# Patient Record
Sex: Male | Born: 1935
Health system: Southern US, Community
[De-identification: ages and names within clinical notes are randomized; demographics above are authoritative.]

## PROBLEM LIST (undated history)

## (undated) DIAGNOSIS — IMO0001 Reserved for inherently not codable concepts without codable children: Secondary | ICD-10-CM

## (undated) DIAGNOSIS — I442 Atrioventricular block, complete: Secondary | ICD-10-CM

## (undated) DIAGNOSIS — N529 Male erectile dysfunction, unspecified: Secondary | ICD-10-CM

## (undated) DIAGNOSIS — I639 Cerebral infarction, unspecified: Secondary | ICD-10-CM

## (undated) DIAGNOSIS — K219 Gastro-esophageal reflux disease without esophagitis: Secondary | ICD-10-CM

## (undated) DIAGNOSIS — I495 Sick sinus syndrome: Secondary | ICD-10-CM

## (undated) DIAGNOSIS — E785 Hyperlipidemia, unspecified: Secondary | ICD-10-CM

## (undated) DIAGNOSIS — N281 Cyst of kidney, acquired: Secondary | ICD-10-CM

## (undated) DIAGNOSIS — G2581 Restless legs syndrome: Secondary | ICD-10-CM

## (undated) DIAGNOSIS — B029 Zoster without complications: Secondary | ICD-10-CM

## (undated) DIAGNOSIS — M7040 Prepatellar bursitis, unspecified knee: Secondary | ICD-10-CM

## (undated) DIAGNOSIS — I10 Essential (primary) hypertension: Secondary | ICD-10-CM

## (undated) DIAGNOSIS — F0390 Unspecified dementia without behavioral disturbance: Secondary | ICD-10-CM

## (undated) DIAGNOSIS — M1990 Unspecified osteoarthritis, unspecified site: Secondary | ICD-10-CM

## (undated) DIAGNOSIS — R5382 Chronic fatigue, unspecified: Secondary | ICD-10-CM

## (undated) DIAGNOSIS — F329 Major depressive disorder, single episode, unspecified: Secondary | ICD-10-CM

## (undated) DIAGNOSIS — N183 Chronic kidney disease, stage 3 unspecified: Secondary | ICD-10-CM

## (undated) DIAGNOSIS — I519 Heart disease, unspecified: Secondary | ICD-10-CM

## (undated) DIAGNOSIS — R911 Solitary pulmonary nodule: Secondary | ICD-10-CM

## (undated) DIAGNOSIS — I4891 Unspecified atrial fibrillation: Secondary | ICD-10-CM

## (undated) DIAGNOSIS — C689 Malignant neoplasm of urinary organ, unspecified: Secondary | ICD-10-CM

## (undated) DIAGNOSIS — Z950 Presence of cardiac pacemaker: Secondary | ICD-10-CM

## (undated) DIAGNOSIS — G4733 Obstructive sleep apnea (adult) (pediatric): Secondary | ICD-10-CM

## (undated) DIAGNOSIS — N289 Disorder of kidney and ureter, unspecified: Secondary | ICD-10-CM

## (undated) DIAGNOSIS — J189 Pneumonia, unspecified organism: Secondary | ICD-10-CM

## (undated) DIAGNOSIS — I4821 Permanent atrial fibrillation: Secondary | ICD-10-CM

## (undated) DIAGNOSIS — F32A Depression, unspecified: Secondary | ICD-10-CM

## (undated) DIAGNOSIS — R7989 Other specified abnormal findings of blood chemistry: Secondary | ICD-10-CM

## (undated) DIAGNOSIS — J40 Bronchitis, not specified as acute or chronic: Secondary | ICD-10-CM

## (undated) DIAGNOSIS — I50810 Right heart failure, unspecified: Secondary | ICD-10-CM

## (undated) DIAGNOSIS — Z8679 Personal history of other diseases of the circulatory system: Secondary | ICD-10-CM

## (undated) DIAGNOSIS — N400 Enlarged prostate without lower urinary tract symptoms: Secondary | ICD-10-CM

## (undated) DIAGNOSIS — E229 Hyperfunction of pituitary gland, unspecified: Secondary | ICD-10-CM

## (undated) HISTORY — PX: HERNIA REPAIR: SHX51

## (undated) HISTORY — DX: Bronchitis, not specified as acute or chronic: J40

## (undated) HISTORY — DX: Unspecified atrial fibrillation: I48.91

## (undated) HISTORY — DX: Other specified abnormal findings of blood chemistry: R79.89

## (undated) HISTORY — PX: OTHER SURGICAL HISTORY: SHX169

## (undated) HISTORY — DX: Heart disease, unspecified: I51.9

## (undated) HISTORY — DX: Major depressive disorder, single episode, unspecified: F32.9

## (undated) HISTORY — DX: Sick sinus syndrome: I49.5

## (undated) HISTORY — DX: Disorder of kidney and ureter, unspecified: N28.9

## (undated) HISTORY — DX: Prepatellar bursitis, unspecified knee: M70.40

## (undated) HISTORY — DX: Chronic kidney disease, stage 3 unspecified: N18.30

## (undated) HISTORY — DX: Chronic kidney disease, stage 3 (moderate): N18.3

## (undated) HISTORY — DX: Cyst of kidney, acquired: N28.1

## (undated) HISTORY — DX: Obstructive sleep apnea (adult) (pediatric): G47.33

## (undated) HISTORY — DX: Hyperfunction of pituitary gland, unspecified: E22.9

## (undated) HISTORY — PX: EYE SURGERY: SHX253

## (undated) HISTORY — DX: Male erectile dysfunction, unspecified: N52.9

## (undated) HISTORY — DX: Personal history of other diseases of the circulatory system: Z86.79

## (undated) HISTORY — DX: Chronic fatigue, unspecified: R53.82

## (undated) HISTORY — DX: Restless legs syndrome: G25.81

## (undated) HISTORY — DX: Unspecified osteoarthritis, unspecified site: M19.90

## (undated) HISTORY — DX: Reserved for inherently not codable concepts without codable children: IMO0001

## (undated) HISTORY — DX: Hyperlipidemia, unspecified: E78.5

## (undated) HISTORY — DX: Solitary pulmonary nodule: R91.1

## (undated) HISTORY — DX: Gastro-esophageal reflux disease without esophagitis: K21.9

## (undated) HISTORY — DX: Malignant neoplasm of urinary organ, unspecified: C68.9

## (undated) HISTORY — DX: Benign prostatic hyperplasia without lower urinary tract symptoms: N40.0

## (undated) HISTORY — DX: Right heart failure, unspecified: I50.810

## (undated) HISTORY — DX: Permanent atrial fibrillation: I48.21

## (undated) HISTORY — DX: Zoster without complications: B02.9

## (undated) HISTORY — DX: Atrioventricular block, complete: I44.2

## (undated) HISTORY — DX: Depression, unspecified: F32.A

---

## 1986-07-04 HISTORY — PX: PACEMAKER INSERTION: SHX728

## 2000-08-03 ENCOUNTER — Inpatient Hospital Stay (HOSPITAL_COMMUNITY): Admission: EM | Admit: 2000-08-03 | Discharge: 2000-08-07 | Payer: Self-pay | Admitting: Emergency Medicine

## 2000-08-04 ENCOUNTER — Encounter (HOSPITAL_BASED_OUTPATIENT_CLINIC_OR_DEPARTMENT_OTHER): Payer: Self-pay | Admitting: Internal Medicine

## 2000-08-05 ENCOUNTER — Encounter (HOSPITAL_BASED_OUTPATIENT_CLINIC_OR_DEPARTMENT_OTHER): Payer: Self-pay | Admitting: Internal Medicine

## 2000-12-06 ENCOUNTER — Inpatient Hospital Stay (HOSPITAL_COMMUNITY): Admission: EM | Admit: 2000-12-06 | Discharge: 2000-12-08 | Payer: Self-pay | Admitting: Emergency Medicine

## 2000-12-06 ENCOUNTER — Encounter: Payer: Self-pay | Admitting: Emergency Medicine

## 2000-12-07 ENCOUNTER — Encounter: Payer: Self-pay | Admitting: Internal Medicine

## 2000-12-08 ENCOUNTER — Encounter: Payer: Self-pay | Admitting: Internal Medicine

## 2001-03-26 HISTORY — PX: DOPPLER ECHOCARDIOGRAPHY: SHX263

## 2002-01-23 ENCOUNTER — Encounter: Payer: Self-pay | Admitting: Internal Medicine

## 2002-01-23 ENCOUNTER — Encounter: Admission: RE | Admit: 2002-01-23 | Discharge: 2002-01-23 | Payer: Self-pay | Admitting: Internal Medicine

## 2003-03-28 ENCOUNTER — Ambulatory Visit (HOSPITAL_COMMUNITY): Admission: RE | Admit: 2003-03-28 | Discharge: 2003-03-28 | Payer: Self-pay | Admitting: *Deleted

## 2003-05-04 ENCOUNTER — Encounter: Payer: Self-pay | Admitting: Internal Medicine

## 2003-05-04 ENCOUNTER — Ambulatory Visit (HOSPITAL_BASED_OUTPATIENT_CLINIC_OR_DEPARTMENT_OTHER): Admission: RE | Admit: 2003-05-04 | Discharge: 2003-05-04 | Payer: Self-pay | Admitting: Internal Medicine

## 2004-08-09 ENCOUNTER — Ambulatory Visit: Payer: Self-pay | Admitting: Internal Medicine

## 2005-02-07 ENCOUNTER — Ambulatory Visit: Payer: Self-pay | Admitting: Internal Medicine

## 2005-05-18 ENCOUNTER — Ambulatory Visit (HOSPITAL_COMMUNITY): Admission: RE | Admit: 2005-05-18 | Discharge: 2005-05-18 | Payer: Self-pay | Admitting: Cardiology

## 2006-02-06 ENCOUNTER — Ambulatory Visit: Payer: Self-pay | Admitting: Internal Medicine

## 2006-03-09 ENCOUNTER — Ambulatory Visit (HOSPITAL_BASED_OUTPATIENT_CLINIC_OR_DEPARTMENT_OTHER): Admission: RE | Admit: 2006-03-09 | Discharge: 2006-03-09 | Payer: Self-pay | Admitting: Internal Medicine

## 2006-03-09 ENCOUNTER — Encounter: Payer: Self-pay | Admitting: Internal Medicine

## 2006-03-11 ENCOUNTER — Ambulatory Visit: Payer: Self-pay | Admitting: Internal Medicine

## 2006-11-14 ENCOUNTER — Encounter: Admission: RE | Admit: 2006-11-14 | Discharge: 2006-11-14 | Payer: Self-pay | Admitting: Cardiology

## 2006-12-26 ENCOUNTER — Encounter: Admission: RE | Admit: 2006-12-26 | Discharge: 2006-12-26 | Payer: Self-pay | Admitting: Orthopedic Surgery

## 2007-06-13 ENCOUNTER — Telehealth (INDEPENDENT_AMBULATORY_CARE_PROVIDER_SITE_OTHER): Payer: Self-pay | Admitting: *Deleted

## 2007-06-14 ENCOUNTER — Telehealth (INDEPENDENT_AMBULATORY_CARE_PROVIDER_SITE_OTHER): Payer: Self-pay | Admitting: *Deleted

## 2007-08-17 DIAGNOSIS — R063 Periodic breathing: Secondary | ICD-10-CM

## 2007-08-17 DIAGNOSIS — G4701 Insomnia due to medical condition: Secondary | ICD-10-CM

## 2007-08-17 DIAGNOSIS — J310 Chronic rhinitis: Secondary | ICD-10-CM | POA: Insufficient documentation

## 2007-08-17 DIAGNOSIS — K219 Gastro-esophageal reflux disease without esophagitis: Secondary | ICD-10-CM | POA: Insufficient documentation

## 2007-08-17 DIAGNOSIS — I4891 Unspecified atrial fibrillation: Secondary | ICD-10-CM

## 2007-08-17 DIAGNOSIS — G8929 Other chronic pain: Secondary | ICD-10-CM

## 2007-08-17 DIAGNOSIS — J42 Unspecified chronic bronchitis: Secondary | ICD-10-CM

## 2007-08-17 DIAGNOSIS — G4733 Obstructive sleep apnea (adult) (pediatric): Secondary | ICD-10-CM

## 2007-08-17 DIAGNOSIS — G2581 Restless legs syndrome: Secondary | ICD-10-CM

## 2007-12-24 ENCOUNTER — Ambulatory Visit: Payer: Self-pay | Admitting: Internal Medicine

## 2007-12-24 DIAGNOSIS — J309 Allergic rhinitis, unspecified: Secondary | ICD-10-CM | POA: Insufficient documentation

## 2008-01-02 ENCOUNTER — Encounter: Payer: Self-pay | Admitting: Internal Medicine

## 2008-03-07 ENCOUNTER — Telehealth: Payer: Self-pay | Admitting: Internal Medicine

## 2008-03-11 ENCOUNTER — Telehealth: Payer: Self-pay | Admitting: Internal Medicine

## 2008-03-17 ENCOUNTER — Telehealth (INDEPENDENT_AMBULATORY_CARE_PROVIDER_SITE_OTHER): Payer: Self-pay | Admitting: *Deleted

## 2008-04-22 HISTORY — PX: US ECHOCARDIOGRAPHY: HXRAD669

## 2008-04-29 ENCOUNTER — Telehealth (INDEPENDENT_AMBULATORY_CARE_PROVIDER_SITE_OTHER): Payer: Self-pay | Admitting: *Deleted

## 2008-05-07 ENCOUNTER — Telehealth (INDEPENDENT_AMBULATORY_CARE_PROVIDER_SITE_OTHER): Payer: Self-pay | Admitting: *Deleted

## 2008-05-12 ENCOUNTER — Telehealth (INDEPENDENT_AMBULATORY_CARE_PROVIDER_SITE_OTHER): Payer: Self-pay | Admitting: *Deleted

## 2008-06-11 ENCOUNTER — Encounter: Payer: Self-pay | Admitting: Internal Medicine

## 2008-07-04 DIAGNOSIS — M7040 Prepatellar bursitis, unspecified knee: Secondary | ICD-10-CM

## 2008-07-04 HISTORY — DX: Prepatellar bursitis, unspecified knee: M70.40

## 2008-07-04 HISTORY — PX: NEPHRECTOMY: SHX65

## 2008-08-11 ENCOUNTER — Telehealth (INDEPENDENT_AMBULATORY_CARE_PROVIDER_SITE_OTHER): Payer: Self-pay | Admitting: *Deleted

## 2008-11-17 ENCOUNTER — Telehealth: Payer: Self-pay | Admitting: Internal Medicine

## 2008-11-18 ENCOUNTER — Telehealth (INDEPENDENT_AMBULATORY_CARE_PROVIDER_SITE_OTHER): Payer: Self-pay | Admitting: *Deleted

## 2008-11-25 ENCOUNTER — Telehealth: Payer: Self-pay | Admitting: Internal Medicine

## 2008-12-02 ENCOUNTER — Encounter: Payer: Self-pay | Admitting: Internal Medicine

## 2008-12-22 ENCOUNTER — Ambulatory Visit: Payer: Self-pay | Admitting: Internal Medicine

## 2009-01-07 ENCOUNTER — Telehealth (INDEPENDENT_AMBULATORY_CARE_PROVIDER_SITE_OTHER): Payer: Self-pay | Admitting: *Deleted

## 2009-02-10 ENCOUNTER — Encounter: Payer: Self-pay | Admitting: Urology

## 2009-02-10 ENCOUNTER — Ambulatory Visit (HOSPITAL_COMMUNITY): Admission: RE | Admit: 2009-02-10 | Discharge: 2009-02-10 | Payer: Self-pay | Admitting: Urology

## 2009-03-03 ENCOUNTER — Telehealth: Payer: Self-pay | Admitting: Internal Medicine

## 2009-03-23 HISTORY — PX: CARDIOVASCULAR STRESS TEST: SHX262

## 2009-03-30 ENCOUNTER — Encounter (INDEPENDENT_AMBULATORY_CARE_PROVIDER_SITE_OTHER): Payer: Self-pay | Admitting: Urology

## 2009-03-30 ENCOUNTER — Inpatient Hospital Stay (HOSPITAL_COMMUNITY): Admission: RE | Admit: 2009-03-30 | Discharge: 2009-04-03 | Payer: Self-pay | Admitting: Urology

## 2009-06-22 ENCOUNTER — Ambulatory Visit: Payer: Self-pay | Admitting: Internal Medicine

## 2009-09-29 ENCOUNTER — Telehealth (INDEPENDENT_AMBULATORY_CARE_PROVIDER_SITE_OTHER): Payer: Self-pay | Admitting: *Deleted

## 2009-10-02 ENCOUNTER — Telehealth: Payer: Self-pay | Admitting: Internal Medicine

## 2009-11-02 ENCOUNTER — Telehealth: Payer: Self-pay | Admitting: Internal Medicine

## 2009-12-31 ENCOUNTER — Ambulatory Visit: Payer: Self-pay | Admitting: Internal Medicine

## 2010-02-17 ENCOUNTER — Ambulatory Visit: Payer: Self-pay | Admitting: Cardiology

## 2010-02-22 ENCOUNTER — Telehealth: Payer: Self-pay | Admitting: Internal Medicine

## 2010-03-19 ENCOUNTER — Ambulatory Visit: Payer: Self-pay | Admitting: Cardiovascular Disease

## 2010-04-19 ENCOUNTER — Ambulatory Visit: Payer: Self-pay | Admitting: Cardiology

## 2010-04-26 ENCOUNTER — Ambulatory Visit: Payer: Self-pay | Admitting: Internal Medicine

## 2010-05-17 ENCOUNTER — Ambulatory Visit: Payer: Self-pay | Admitting: Cardiology

## 2010-05-24 ENCOUNTER — Telehealth (INDEPENDENT_AMBULATORY_CARE_PROVIDER_SITE_OTHER): Payer: Self-pay | Admitting: *Deleted

## 2010-06-03 ENCOUNTER — Encounter (INDEPENDENT_AMBULATORY_CARE_PROVIDER_SITE_OTHER): Payer: Self-pay | Admitting: *Deleted

## 2010-06-14 ENCOUNTER — Ambulatory Visit: Payer: Self-pay | Admitting: Cardiology

## 2010-06-14 ENCOUNTER — Encounter: Payer: Self-pay | Admitting: Internal Medicine

## 2010-07-15 ENCOUNTER — Ambulatory Visit: Payer: Self-pay | Admitting: Cardiology

## 2010-08-03 NOTE — Progress Notes (Signed)
Summary: prescription  Phone Note Call from Patient Call back at Home Phone [phone]   Caller: Patient Call For: young Summary of Call: Wants rx for clonazepam 2mg .Orlinda Blalock Initial call taken by: Darletta Moll,  Nov 02, 2009 12:00 PM  Follow-up for Phone Call        advised pt that rx for 2 mg # 100 x3 refills was sent to [facility] on 10/02/09. pt aware he can go get med from pharmacy.Carron Curie CMA  Nov 02, 2009 12:41 PM

## 2010-08-03 NOTE — Progress Notes (Signed)
Summary: fax confirmation  Phone Note Call from Patient Call back at Home Phone [phone]   Caller: Patient Call For: young Summary of Call: pt faxed a rx refill form for NORTRIPTYLINE HCL ([facility].  he wants to know if this has been received.  Initial call taken by: Tivis Ringer, CNA,  February 22, 2010 4:45 PM  Follow-up for Phone Call        Pls advise if okay to refill rx for 90 day supply on nortriptyline. Thanks Follow-up by: Vernie Murders,  February 22, 2010 4:58 PM  Additional Follow-up for Phone Call Additional follow up Details #1::        OK Additional Follow-up by: Waymon Budge MD,  February 23, 2010 1:06 PM    Additional Follow-up for Phone Call Additional follow up Details #2::    refill sent. pt aware.Carron Curie CMA  February 23, 2010 1:25 PM   Prescriptions: NORTRIPTYLINE HCL 10 MG CAPS (NORTRIPTYLINE HCL) take 1 by mouth at bedtime  #90 x 0   Entered by:   Carron Curie CMA   Authorized by:   Waymon Budge MD   Signed by:   Carron Curie CMA on 02/23/2010   Method used:   Faxed to ...       [facility] ([facility]order)       [address]       [place], [place]  [address]       Ph: [phone]       Fax: [phone]   RxID:   [id number]

## 2010-08-03 NOTE — Progress Notes (Signed)
Summary: rx refill- wants asap  Phone Note Call from Patient   Caller: Patient Call For: young Summary of Call: pt wants rx called in for refill of CLONAZEPAM- wants asap as he is leaving town. he has already called the pharmacy but called here to 'expedite this". pt cell [phone] Initial call taken by: Tivis Ringer, CNA,  May 24, 2010 9:11 AM  Follow-up for Phone Call        Pt informed that refill was called to [facility] pharmacy. Abigail Miyamoto RN  May 24, 2010 9:57 AM     Prescriptions: CLONAZEPAM 2 MG TABS (CLONAZEPAM) 1 for sleep if needed  #100 x 3   Entered by:   Abigail Miyamoto RN   Authorized by:   Waymon Budge MD   Signed by:   Abigail Miyamoto RN on 05/24/2010   Method used:   Telephoned to ...       [facility] [phone]* (retail)       [address] [place], [place]  [address]       Ph: [phone]       Fax: [phone]   RxID:   [id number]

## 2010-08-03 NOTE — Letter (Signed)
Summary: Remote Device Check  [facility], Main Office  [address]   [place], [place] [address]   Phone: [phone]  Fax: [phone]     June 03, 2010 MRN: [id number]   [facility] [address] [place], [place]  [address]   Dear Mr. Folk,   Your remote transmission was recieved and reviewed by your physician.  All diagnostics were within normal limits for you.  _____Your next transmission is scheduled for:                       .  Please transmit at any time this day.  If you have a wireless device your transmission will be sent automatically.  ___X___Your next office visit is scheduled for:  January 2012 with Dr. Johney Frame.                            . Please call our office to schedule an appointment.    Sincerely,  Altha Harm, LPN

## 2010-08-03 NOTE — Progress Notes (Signed)
Summary: clonazepam question   Phone Note Call from Patient   Caller: Patient Call For: young Summary of Call: clonezapam's quanity is incorrect ,pt received 30 pills instead of 100. would like it corrected on pt's med list Initial call taken by: Rickard Patience,  October 02, 2009 1:47 PM  Follow-up for Phone Call        per pt's med list, he has been given clonazepam 1mg  #90 in the past with only 1 refill.  this time when the dosage was increased he was given #30 with 5 refills.  basically the same quantity just dispensed in small increments.  would pt like this changed to a 90day supply?  LMOM TCB. Boone Master CNA  October 02, 2009 2:39 PM   pt returned call, he requests a 90day supply (#100, preferably) b/c his copay is the same for both quantity.  please advise if this is okay.  pt states he does not need a call-back unless there is an issue with his request. Boone Master CNA  October 02, 2009 4:03 PM   Additional Follow-up for Phone Call Additional follow up Details #1::        I have changed the med list to give the 2 mg tabs, 2 mg, 3 month supply. Please send. Additional Follow-up by: Waymon Budge MD,  October 02, 2009 8:48 PM    Additional Follow-up for Phone Call Additional follow up Details #2::    rx sent to [facility]. Carron Curie CMA  October 05, 2009 9:13 AM   New/Updated Medications: CLONAZEPAM 2 MG TABS (CLONAZEPAM) 1 for sleep if needed Prescriptions: CLONAZEPAM 2 MG TABS (CLONAZEPAM) 1 for sleep if needed  #100 x 3   Entered and Authorized by:   Waymon Budge MD   Signed by:   Waymon Budge MD on 10/02/2009   Method used:   Historical   RxID:   [id number]

## 2010-08-03 NOTE — Progress Notes (Signed)
Summary: prescript   LMTCBx1  Phone Note Call from Patient   Caller: Patient Call For: young Summary of Call: pt would like to know if clonazepane come in 2mg   Initial call taken by: Rickard Patience,  September 29, 2009 9:18 AM  Follow-up for Phone Call        Clonazepam does come in 2mg .  LMOMTCBX1.  Aundra Millet Reynolds LPN  September 29, 2009 9:35 AM   called and spoke with pt.  pt aware Clonazepam does come in 2mg .  Pt requests a new rx for this.  Pt states he currently has Clonazepam 1mg  tabs but takes 2 tabs at bedtime.  Pt would like new rx for Clonazepam 2mg  take 1 tab by mouth at bedtime and would like a 3 month supply called into [facility].  Please advise.  thanks.  Aundra Millet Reynolds LPN  September 29, 2009 12:58 PM   Additional Follow-up for Phone Call Additional follow up Details #1::        Yes. We can change if needed, but it is harder to adjust dose  if a single tab is used. Is that what he wants? Additional Follow-up by: Waymon Budge MD,  September 29, 2009 1:08 PM    Additional Follow-up for Phone Call Additional follow up Details #2::    yes.  this is what he wants.  Aundra Millet Reynolds LPN  September 29, 2009 1:13 PM   Additional Follow-up for Phone Call Additional follow up Details #3:: Details for Additional Follow-up Action Taken: I have changed his clonazepam script- please  send.  LMOM Informing pt rx sent to pharmacy.  Aundra Millet Reynolds LPN  September 29, 2009 1:49 PM  Additional Follow-up by: Waymon Budge MD,  September 29, 2009 1:33 PM  New/Updated Medications: CLONAZEPAM 2 MG TABS (CLONAZEPAM) 1 for sleep if needed Prescriptions: CLONAZEPAM 2 MG TABS (CLONAZEPAM) 1 for sleep if needed  #30 x 5   Entered by:   Arman Filter LPN   Authorized by:   Waymon Budge MD   Signed by:   Arman Filter LPN on 36/64/4034   Method used:   Telephoned to ...       [facility] [phone]* (retail)       [address] [place], [place]  [address]       Ph: [phone]       Fax: [phone]   RxID:   [id number] CLONAZEPAM 2 MG TABS (CLONAZEPAM) 1 for sleep if needed  #30 x 5   Entered by:   Waymon Budge MD   Authorized by:   Pulmonary Triage   Signed by:   Waymon Budge MD on 09/29/2009   Method used:   Historical   RxID:   [id number]

## 2010-08-03 NOTE — Assessment & Plan Note (Signed)
Summary: 6 months/apc   Primary Provider/Referring Provider:  Kirby Funk  CC:  6 month follow up visit-sleep; doing well with CPAP.  History of Present Illness: 16-Jan-2009- Allergic rhinitis, Bronchitis. OSA Rhinitis- [facility] gave steroid nasal spray as substitute for Nasonex for complaint of watery rhnorhea without ear discomfort, headache or epistaxix. Does not have glaucoma- we discussed trial of ipratropium. Constant sense of fluid in mouth. Went to neurologist who reduced Reglan and has him tapering it. It was given for hx of GERD and aspiration pneumonia.  OSA- CPAP works fine. He asks about oral appliances- as alternative to pressure mask straps -discussed.   June 22, 2009- Allergic rhinitis, Bronchitis, OSA hx GERD Had laparoscopic nephrectomy without respiratory problems. Currently off lasix for renal function. Continues CPAP every night at 7 cwp, with clonazepam 2 mg for sleep and movement. Remains off Reglan, taking omeprazole two times a day. Denies reflux events or aspiration concerns with this change. Rhinits/ rhinorhea is well controlled now with ipratropium nasal spray, replacing Nasonex.  December 31, 2009- Allergic rhinitis,  Bronchitis, OSA, Hx GERD He is very pleased with his [place] CPAP mask, pressure still set at 7. He uses it every night. No sleepiness or snore through. He feels nortriptylline still helps with sleep and depression. He got through right nephrectomy for cancer and wore cpap at hospital. He got through recent upper endoscopy without problems.      Preventive Screening-Counseling & Management  Alcohol-Tobacco     Smoking Status: never  [place] Medications (verified): 1)  Cpap 7 Cwp ([facility]) .... At Bedtime 2)  Clonazepam 2 Mg Tabs (Clonazepam) .Marland Kitchen.. 1 For Sleep If Needed 3)  Atenolol 25 Mg  Tabs (Atenolol) .... Take 1 Tablet By Mouth Once A Day 4)  Coumadin 5 Mg  Tabs (Warfarin Sodium) .... As Directed Per Clinic 5)  Doxazosin Mesylate 8 Mg  Tabs  (Doxazosin Mesylate) .... Take 1 Tablet By Mouth Once A Day 6)  Lasix 40 Mg  Tabs (Furosemide) .... Take 1 Tablet By Mouth Once A Day 7)  Simvastatin 10 Mg  Tabs (Simvastatin) .Marland Kitchen.. 1 By Mouth Daily 8)  Bayer Aspirin Ec Low Dose 81 Mg  Tbec (Aspirin) .... Take 1 Tablet By Mouth Once A Day 9)  Finasteride 5 Mg  Tabs (Finasteride) .Marland Kitchen.. 1 By Mouth Daily 10)  Nortriptyline Hcl 10 Mg Caps (Nortriptyline Hcl) .... Take 1 By Mouth At Bedtime 11)  Galantamine Hydrobromide 4 Mg Tabs (Galantamine Hydrobromide) .... Two Times A Day 12)  Ipratropium Bromide 0.06 % Soln (Ipratropium Bromide) .Marland Kitchen.. 1-2 Puffs Each Nostril Three Times A Day As Needed 13)  Omeprazole 20 Mg Cpdr (Omeprazole) .... Take 1 By Mouth Two Times A Day 14)  Fluoracaine 0.25-0.5 % Soln (Fluorescein-Proparacaine) 15)  Artificial Tears  Soln (Artificial Tear Solution) .... At Bedtime 16)  Amlodipine Besylate 2.5 Mg Tabs (Amlodipine Besylate) .... Take 1 By Mouth Once Daily  Allergies (verified): No Known Drug Allergies  Past History:  Past Medical History: Last updated: January 16, 2009 ALLERGIC RHINITIS (ICD-477.9) RESTLESS LEG SYNDROME (ICD-333.94) FIBRILLATION, ATRIAL (ICD-427.31) CARDIOMYOPATHY (ICD-425.4) ESOPHAGEAL REFLUX (ICD-530.81) BRONCHITIS (ICD-490) RHINITIS (ICD-472.0) CHEYNE-STOKES RESPIRATION (ICD-786.04) SOMNOLENCE (ICD-780.09) OBSTRUCTIVE SLEEP APNEA (ICD-327.23)- NPSG 03/09/06- AHI 39.7  Past Surgical History: Last updated: 06/22/2009 Pacemaker insertion Right nephrectomy, laparoscopic - cancer- 2010 Hernia repairs  Family History: Last updated: January 16, 2009 Father- died CVA Mother- died CVA  Social History: Last updated: 06/22/2009 Married Patient never smoked.  Retired Ecologist  Risk Factors: Smoking Status: never (12/31/2009)  Review of Systems      See HPI  The patient denies anorexia, fever, weight loss, weight gain, vision loss, decreased hearing, hoarseness, chest pain, syncope,  dyspnea on exertion, peripheral edema, prolonged cough, headaches, hemoptysis, abdominal pain, melena, hematochezia, and severe indigestion/heartburn.    Vital Signs:  Patient profile:   77 year old male Height:      70 inches Weight:      184 pounds BMI:     26.50 O2 Sat:      99 % on Room air Pulse rate:   84 / minute BP sitting:   120 / 70  (left arm) Cuff size:   regular  Vitals Entered By: Reynaldo Minium CMA (December 31, 2009 4:39 PM)  O2 Flow:  Room air CC: 6 month follow up visit-sleep; doing well with CPAP   Physical Exam  Additional Exam:  General: A/Ox3; pleasant and cooperative, NAD, SKIN: no rash, lesions NODES: no lymphadenopathy HEENT: [place]/AT, EOM- WNL, Conjuctivae- clear, PERRLA, TM-WNL, Nose- clear now without excess mucus seen, Throat- clear and wnl, Mallampatii II-III with long uvula NECK: Supple w/ fair ROM, JVD- none, normal carotid impulses w/o bruits Thyroid- normal to palpation CHEST: Clear to P&A HEART: RRR, no m/g/r heard ABDOMEN: Soft and nl; QMV:HQIO, nl pulses, there is no edema  NEURO: Grossly intact to observation,Seems alert and on top of his meds and situation this visit.      Impression & Recommendations:  Problem # 1:  OBSTRUCTIVE SLEEP APNEA (ICD-327.23)  Good control and compliance with CPAP at 7 Nortriptyline stabilized mood and consolidated sleep.  Problem # 2:  ALLERGIC RHINITIS (ICD-477.9) Vasomotor rhinitis component has responded well to ipratropium when needed. His updated medication list for this problem includes:    Ipratropium Bromide 0.06 % Soln (Ipratropium bromide) .Marland Kitchen... 1-2 puffs each nostril three times a day as needed  Problem # 3:  RESTLESS LEG SYNDROME (ICD-333.94) He has been less aware of limb jerks as a problem. Tricyclics sometimes worsen sleep related movement, but he has done well.  Medications Added to Medication List This Visit: 1)  Ipratropium Bromide 0.06 % Soln (Ipratropium bromide) .Marland Kitchen.. 1-2 puffs each  nostril three times a day as needed 2)  Amlodipine Besylate 2.5 Mg Tabs (Amlodipine besylate) .... Take 1 by mouth once daily  Other Orders: Est. Patient Level III (96295)  Patient Instructions: 1)  Please schedule a follow-up appointment in 1 year. 2)  continue CPAP at 7. Please call if I can be of help sooner.

## 2010-08-05 NOTE — Miscellaneous (Signed)
Summary: Device preload  Clinical Lists Changes  Observations: Added new observation of PPM INDICATN: Sick sinus syndrome (06/14/2010 19:15) Added new observation of MAGNET RTE: BOL 85 ERI 65 (06/14/2010 19:15) Added new observation of PPMLEADSTAT1: active (06/14/2010 19:15) Added new observation of PPMLEADSER1: [id number] (06/14/2010 19:15) Added new observation of PPMLEADMOD1: 4003-58C (06/14/2010 19:15) Added new observation of PPMLEADDOI1: [birth year]/18/1987 (06/14/2010 19:15) Added new observation of PPMLEADLOC1: RV (06/14/2010 19:15) Added new observation of PPM DOI: 05/18/2005 (06/14/2010 19:15) Added new observation of PPM SERL#: [id number] (06/14/2010 19:15) Added new observation of PPM MODL#: [id number] (06/14/2010 19:15) Added new observation of PACEMAKERMFG: [employer]tronic (06/14/2010 19:15) Added new observation of PPM IMP MD: Duffy Rhody Tennant,MD (06/14/2010 19:15) Added new observation of PPM REFER MD: Rolla Plate (06/14/2010 19:15) Added new observation of PACEMAKER MD: Hillis Range, MD (06/14/2010 19:15)      PPM Specifications Following MD:  Hillis Range, MD     Referring MD:  Rolla Plate PPM Vendor:  Medtronic     PPM Model Number:  [id number]     PPM Serial Number:  [id number] PPM DOI:  05/18/2005     PPM Implanting MD:  Rolla Plate  Lead 1    Location: RV     DOI: 03/18/1987     Model #: [id number]     Serial #: [id number]     Status: active  Magnet Response Rate:  BOL 85 ERI 65  Indications:  Sick sinus syndrome

## 2010-08-13 ENCOUNTER — Encounter: Payer: Self-pay | Admitting: Internal Medicine

## 2010-08-13 ENCOUNTER — Other Ambulatory Visit (INDEPENDENT_AMBULATORY_CARE_PROVIDER_SITE_OTHER): Payer: Medicare Other

## 2010-08-13 ENCOUNTER — Encounter (INDEPENDENT_AMBULATORY_CARE_PROVIDER_SITE_OTHER): Payer: Medicare Other | Admitting: Internal Medicine

## 2010-08-13 DIAGNOSIS — I495 Sick sinus syndrome: Secondary | ICD-10-CM

## 2010-08-13 DIAGNOSIS — I4891 Unspecified atrial fibrillation: Secondary | ICD-10-CM

## 2010-08-13 DIAGNOSIS — Z7901 Long term (current) use of anticoagulants: Secondary | ICD-10-CM

## 2010-08-16 DIAGNOSIS — I495 Sick sinus syndrome: Secondary | ICD-10-CM | POA: Insufficient documentation

## 2010-08-18 ENCOUNTER — Telehealth (INDEPENDENT_AMBULATORY_CARE_PROVIDER_SITE_OTHER): Payer: Self-pay | Admitting: *Deleted

## 2010-08-25 NOTE — Progress Notes (Addendum)
  Phone Note Other Incoming   Request: Send information Summary of Call: Request for records received from [facility]. Request forwarded to [facility].  Last 5 yrs-Young     Appended Document:  Request for records received from [facility]. Request forwarded to [facility].  Last 5 yrs-Young

## 2010-08-25 NOTE — Assessment & Plan Note (Signed)
Summary: pacer ck/mt/pt of dr tennant/kwb   Visit Type:  Initial Consult Primary Provider:  Kirby Funk   History of Present Illness: [name] is a pleasant 77 yo male with a h/o permanent atrial fibrillation and bradycardia s/p PPM who presents today to [place] care in the EP device clinic.  He reports initially having a PPM placed in 1988.  His first generator change was 12/1995.  His most recent generator change was 05/18/05 by Dr Deborah Chalk.  He has done very well.  He remains active.   He denies symptoms of palpitations, chest pain, shortness of breath, orthopnea, PND, lower extremity edema, dizziness, presyncope, syncope, or neurologic sequela. The patient is tolerating medications without difficulties and is otherwise without complaint today.   [place] Medications (verified): 1)  Cpap 7 Cwp ([facility]) .... At Bedtime 2)  Clonazepam 2 Mg Tabs (Clonazepam) .Marland Kitchen.. 1 For Sleep If Needed 3)  Atenolol 25 Mg  Tabs (Atenolol) .... Take 1 Tablet By Mouth Once A Day 4)  Coumadin 5 Mg  Tabs (Warfarin Sodium) .... As Directed Per Clinic 5)  Doxazosin Mesylate 8 Mg  Tabs (Doxazosin Mesylate) .... Take 1 Tablet By Mouth Once A Day 6)  Lasix 40 Mg  Tabs (Furosemide) .... Take 1 Tablet By Mouth Once A Day 7)  Simvastatin 10 Mg  Tabs (Simvastatin) .Marland Kitchen.. 1 By Mouth Daily 8)  Bayer Aspirin Ec Low Dose 81 Mg  Tbec (Aspirin) .... Take 1 Tablet By Mouth Once A Day 9)  Finasteride 5 Mg  Tabs (Finasteride) .Marland Kitchen.. 1 By Mouth Daily 10)  Nortriptyline Hcl 10 Mg Caps (Nortriptyline Hcl) .... Take 1 By Mouth At Bedtime 11)  Galantamine Hydrobromide 4 Mg Tabs (Galantamine Hydrobromide) .... Two Times A Day 12)  Ipratropium Bromide 0.06 % Soln (Ipratropium Bromide) .Marland Kitchen.. 1-2 Puffs Each Nostril Three Times A Day As Needed 13)  Omeprazole 20 Mg Cpdr (Omeprazole) .... Take 1 By Mouth Two Times A Day 14)  Fluoracaine 0.25-0.5 % Soln (Fluorescein-Proparacaine) 15)  Artificial Tears  Soln (Artificial Tear Solution) .... At  Bedtime 16)  Amlodipine Besylate 2.5 Mg Tabs (Amlodipine Besylate) .... Take 1 By Mouth Once Daily 17)  Valtrex 500 Mg Tabs (Valacyclovir Hcl) .... As Needed 18)  Multivitamins   Tabs (Multiple Vitamin) .... Once Daily  Allergies (verified): No Known Drug Allergies  Past History:  Past Medical History: ALLERGIC RHINITIS (ICD-477.9) RESTLESS LEG SYNDROME (ICD-333.94) PERMANENT ATRIAL FIBRILLATION BRADYCARDIA s/p PPM CARDIOMYOPATHY (ICD-425.4) ESOPHAGEAL REFLUX (ICD-530.81) BRONCHITIS (ICD-490) RHINITIS (ICD-472.0) OBSTRUCTIVE SLEEP APNEA (ICD-327.23)- NPSG 03/09/06- AHI 39.7 prior TIA  Past Surgical History: Pacemaker insertion 1988, most recent generator change 05/18/05 Right nephrectomy, laparoscopic - cancer- 2010 Hernia repairs  Family History: Reviewed history from 12/22/2008 and no changes required. Father- died CVA Mother- died CVA  Social History: Reviewed history from 08/12/2010 and no changes required. 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   Vital Signs:  Patient profile:   77 year old male Height:      70 inches Weight:      181 pounds BMI:     26.06 Pulse rate:   62 / minute BP sitting:   104 / 64  (left arm)  Vitals Entered By: [name] CMA (August 13, 2010 12:33 PM)  Physical Exam  General:  Well developed, well nourished, in no acute distress. Head:  normocephalic and atraumatic Eyes:  PERRLA/EOM intact; conjunctiva and lids normal. Mouth:  Teeth, gums and palate normal. Oral mucosa normal. Neck:  supple Chest Wall:  pacemaker pocket is well healed Lungs:  Clear bilaterally to auscultation and percussion. Heart:  RRR (paced) Abdomen:  Bowel sounds positive; abdomen soft and non-tender without masses, organomegaly, or hernias noted. No hepatosplenomegaly. Msk:  Back normal, normal gait. Muscle strength  and tone normal. Extremities:  No clubbing or cyanosis. Neurologic:  Alert and oriented x 3. Skin:  Intact without lesions or rashes. Psych:  Normal affect.   PPM Specifications Following MD:  Hillis Range, MD     Referring MD:  Rolla Plate PPM Vendor:  Medtronic     PPM Model Number:  [id number]     PPM Serial Number:  [id number] PPM DOI:  05/18/2005     PPM Implanting MD:  Duffy Rhody Tennant,MD  Lead 1    Location: RV     DOI: 03/18/1987     Model #: [id number]     Serial #: [id number]     Status: active  Magnet Response Rate:  BOL 85 ERI 65  Indications:  Sick sinus syndrome  MD Comments:  see scanned report  Impression & Recommendations:  Problem # 1:  BRADYCARDIA-TACHYCARDIA SYNDROME (ICD-427.81) normal pacemaker function see scanned document  Problem # 2:  FIBRILLATION, ATRIAL (ICD-427.31) stable continue coumadin longterm  Problem # 3:  OBSTRUCTIVE SLEEP APNEA (ICD-327.23) CPAP encouraged

## 2010-08-26 ENCOUNTER — Ambulatory Visit (INDEPENDENT_AMBULATORY_CARE_PROVIDER_SITE_OTHER): Payer: Medicare Other | Admitting: Cardiology

## 2010-08-26 DIAGNOSIS — Z950 Presence of cardiac pacemaker: Secondary | ICD-10-CM

## 2010-08-26 DIAGNOSIS — I4891 Unspecified atrial fibrillation: Secondary | ICD-10-CM

## 2010-08-26 DIAGNOSIS — Z7901 Long term (current) use of anticoagulants: Secondary | ICD-10-CM

## 2010-08-30 ENCOUNTER — Telehealth (INDEPENDENT_AMBULATORY_CARE_PROVIDER_SITE_OTHER): Payer: Self-pay | Admitting: *Deleted

## 2010-08-31 NOTE — Cardiovascular Report (Signed)
Summary: Office Visit   Office Visit   Imported By: Roderic Ovens 08/27/2010 15:16:43  _____________________________________________________________________  External Attachment:    Type:   Image     Comment:   External Document

## 2010-09-09 NOTE — Progress Notes (Signed)
Summary: rx request  Phone Note Call from Patient Call back at Home Phone [phone]   Caller: Patient Call For: young Summary of Call: pt has new ins. so he requests a new rx for NORTRIPTYLINE HCL.- [facility]- 90 days x 1 yr rx.  Initial call taken by: Tivis Ringer, CNA,  August 30, 2010 4:32 PM  Follow-up for Phone Call        CY is it ok to refill this med for the pt??  last seen 12/31/2009 with no pending appts scheduled.  pt requesting that this med be sent to [facility] for 1 year supply.  please advise Randell Loop [facility]  August 30, 2010 5:14 PM   Additional Follow-up for Phone Call Additional follow up Details #1::        Per CDY-oky to refill x 1 year. I have printed the Rx for CDY to sign and will fax to [facility] for patient.Reynaldo Minium CMA  August 30, 2010 5:19 PM   Sent to [facility] CMA  August 30, 2010 5:20 PM     Prescriptions: NORTRIPTYLINE HCL 10 MG CAPS (NORTRIPTYLINE HCL) take 1 by mouth at bedtime  #90 x 3   Entered by:   Reynaldo Minium CMA   Authorized by:   Waymon Budge MD   Signed by:   Reynaldo Minium CMA on 08/30/2010   Method used:   Electronically to        [facility] (retail)             ,          Ph: [phone]       Fax: [phone]   RxID:   [id number]

## 2010-09-21 ENCOUNTER — Telehealth: Payer: Self-pay | Admitting: *Deleted

## 2010-09-21 ENCOUNTER — Other Ambulatory Visit: Payer: Self-pay | Admitting: *Deleted

## 2010-09-21 DIAGNOSIS — I4891 Unspecified atrial fibrillation: Secondary | ICD-10-CM

## 2010-09-21 MED ORDER — ATENOLOL 25 MG PO TABS: 25.0000 mg | ORAL_TABLET | Freq: Every day | ORAL | Status: DC

## 2010-09-21 NOTE — Telephone Encounter (Signed)
Informed pt that RN sent in Atenolol to [facility].

## 2010-09-23 ENCOUNTER — Encounter: Payer: Medicare Other | Admitting: *Deleted

## 2010-09-24 ENCOUNTER — Other Ambulatory Visit (INDEPENDENT_AMBULATORY_CARE_PROVIDER_SITE_OTHER): Payer: Medicare Other | Admitting: *Deleted

## 2010-09-24 ENCOUNTER — Other Ambulatory Visit: Payer: Medicare Other | Admitting: *Deleted

## 2010-09-24 DIAGNOSIS — Z7901 Long term (current) use of anticoagulants: Secondary | ICD-10-CM

## 2010-09-24 DIAGNOSIS — I4891 Unspecified atrial fibrillation: Secondary | ICD-10-CM

## 2010-10-06 ENCOUNTER — Ambulatory Visit (INDEPENDENT_AMBULATORY_CARE_PROVIDER_SITE_OTHER): Payer: Medicare Other | Admitting: *Deleted

## 2010-10-06 DIAGNOSIS — Z7901 Long term (current) use of anticoagulants: Secondary | ICD-10-CM

## 2010-10-06 DIAGNOSIS — I4891 Unspecified atrial fibrillation: Secondary | ICD-10-CM

## 2010-10-06 LAB — POCT INR: INR: 2.1

## 2010-10-07 LAB — PROTIME-INR
INR: 1.2 (ref 0.00–1.49)
Prothrombin Time: 14.9 seconds (ref 11.6–15.2)

## 2010-10-07 LAB — HEMOGLOBIN AND HEMATOCRIT, BLOOD
HCT: 32.2 % — ABNORMAL LOW (ref 39.0–52.0)
Hemoglobin: 10.9 g/dL — ABNORMAL LOW (ref 13.0–17.0)

## 2010-10-08 LAB — HEMOGLOBIN AND HEMATOCRIT, BLOOD
HCT: 34.4 % — ABNORMAL LOW (ref 39.0–52.0)
HCT: 39.6 % (ref 39.0–52.0)
Hemoglobin: 11.5 g/dL — ABNORMAL LOW (ref 13.0–17.0)
Hemoglobin: 12.3 g/dL — ABNORMAL LOW (ref 13.0–17.0)
Hemoglobin: 13.1 g/dL (ref 13.0–17.0)

## 2010-10-08 LAB — BASIC METABOLIC PANEL
BUN: 14 mg/dL (ref 6–23)
CO2: 29 mEq/L (ref 19–32)
Calcium: 8.2 mg/dL — ABNORMAL LOW (ref 8.4–10.5)
Chloride: 105 mEq/L (ref 96–112)
Chloride: 106 mEq/L (ref 96–112)
GFR calc Af Amer: 53 mL/min — ABNORMAL LOW (ref >60)
GFR calc non Af Amer: 42 mL/min — ABNORMAL LOW (ref >60)
GFR calc non Af Amer: 44 mL/min — ABNORMAL LOW (ref >60)
Glucose, Bld: 150 mg/dL — ABNORMAL HIGH (ref 70–99)
Potassium: 3.7 mEq/L (ref 3.5–5.1)
Potassium: 4 mEq/L (ref 3.5–5.1)
Potassium: 4.1 mEq/L (ref 3.5–5.1)
Sodium: 134 mEq/L — ABNORMAL LOW (ref 135–145)
Sodium: 138 mEq/L (ref 135–145)

## 2010-10-08 LAB — CBC
HCT: 39.2 % (ref 39.0–52.0)
Platelets: 144 10*3/uL — ABNORMAL LOW (ref 150–400)
RDW: 12.1 % (ref 11.5–15.5)
WBC: 3.5 10*3/uL — ABNORMAL LOW (ref 4.0–10.5)

## 2010-10-08 LAB — PROTIME-INR: Prothrombin Time: 15.1 seconds (ref 11.6–15.2)

## 2010-10-08 LAB — CREATININE, FLUID (PLEURAL, PERITONEAL, JP DRAINAGE): Creat, Fluid: 1.4 mg/dL

## 2010-10-08 LAB — APTT: aPTT: 28 seconds (ref 24–37)

## 2010-10-08 LAB — ABO/RH: ABO/RH(D): O POS

## 2010-10-08 LAB — TYPE AND SCREEN: ABO/RH(D): O POS

## 2010-10-09 LAB — BASIC METABOLIC PANEL
BUN: 16 mg/dL (ref 6–23)
Calcium: 9.3 mg/dL (ref 8.4–10.5)
Creatinine, Ser: 0.92 mg/dL (ref 0.4–1.5)
GFR calc non Af Amer: >60 mL/min (ref >60)
Glucose, Bld: 112 mg/dL — ABNORMAL HIGH (ref 70–99)

## 2010-10-09 LAB — PROTIME-INR: INR: 1.3 (ref 0.00–1.49)

## 2010-10-09 LAB — APTT: aPTT: 29 seconds (ref 24–37)

## 2010-10-26 ENCOUNTER — Other Ambulatory Visit: Payer: Self-pay | Admitting: Dermatology

## 2010-11-01 ENCOUNTER — Telehealth: Payer: Self-pay | Admitting: Cardiology

## 2010-11-01 NOTE — Telephone Encounter (Signed)
Patient is going to begin using [facility] mail order for his meds and would like his Warfin called into that company. He wants to speak with RN about it.

## 2010-11-02 NOTE — Telephone Encounter (Signed)
RN faxed script for Warfarin 5 mg one tablet four days a week, 1/2 tablet three days a week or as directed #90 with one refill to [facility].  Pt aware.

## 2010-11-05 ENCOUNTER — Ambulatory Visit (INDEPENDENT_AMBULATORY_CARE_PROVIDER_SITE_OTHER): Payer: Medicare Other | Admitting: *Deleted

## 2010-11-05 DIAGNOSIS — I4891 Unspecified atrial fibrillation: Secondary | ICD-10-CM

## 2010-11-05 LAB — POCT INR: INR: 2.3

## 2010-11-11 ENCOUNTER — Encounter: Payer: Medicare Other | Admitting: *Deleted

## 2010-11-16 ENCOUNTER — Ambulatory Visit (INDEPENDENT_AMBULATORY_CARE_PROVIDER_SITE_OTHER): Payer: Medicare Other | Admitting: *Deleted

## 2010-11-16 ENCOUNTER — Other Ambulatory Visit: Payer: Self-pay | Admitting: Internal Medicine

## 2010-11-16 DIAGNOSIS — I495 Sick sinus syndrome: Secondary | ICD-10-CM

## 2010-11-16 NOTE — Op Note (Signed)
NAME:  [name], [name]              ACCOUNT NO.:  [id number]   MEDICAL RECORD NO.:  [id number]          PATIENT TYPE:  AMB   LOCATION:  DAY                          FACILITY:  [facility]   PHYSICIAN:  Excell Seltzer. Annabell Howells, M.D.    DATE OF BIRTH:  [birth year]   DATE OF PROCEDURE:  02/10/2009  DATE OF DISCHARGE:                               OPERATIVE REPORT   PROCEDURE:  Cystoscopy, right retrograde pyelogram with interpretation,  right ureterorenoscopy with barbotage cytology and insertion of right  double-J stent.   PREOPERATIVE DIAGNOSIS:  Right upper pole renal filling defect.   POSTOPERATIVE DIAGNOSIS:  Right upper pole renal filling defect with  apparent transitional cell carcinoma of an upper pole caliceal  infundibulum with satellite lesions.   SURGEON:  Dr. Bjorn Pippin.   ANESTHESIA:  General.   SPECIMEN:  Barbotage cytology from the right kidney.   DRAIN:  6-French 26 cm double-J stent.   COMPLICATIONS:  None.   INDICATIONS:  [name] is a 77 year old white male who was sent for  evaluation of hematuria and on CT scan was found to have an enhancing  filling defect in the upper pole of the kidney suspicious for  transitional cell carcinoma.  It was felt that ureteroscopy was  indicated for confirmation.   FINDINGS AND PROCEDURE:  The patient been off Coumadin for 5 days preop.  His INR was 1.3.  He was given Cipro.  Has taken to the operating room  where general anesthetic was induced.  He was placed in the lithotomy  position.  He was fitted with PAS hose.  He was prepped with Betadine  solution and draped in usual sterile fashion.  Time-out was performed.   Cystoscopy was performed using the 22-French scope and 12 and 70 degrees  lenses.  Examination revealed a normal urethra.  The external sphincter  was intact.  The prostatic urethra was short and bilobar hyperplasia and  mild obstruction.  Examination of bladder revealed mild trabeculation.  No tumor, stones or  inflammation were noted.  Ureteral orifices were  unremarkable.   The right ureteral orifice was cannulated with a 5-French open-end  catheter and contrast was instilled.  This demonstrated some J hooking  of the distal ureter but an otherwise unremarkable ureter to the renal  pelvis.  In the upper pole there was approximately 1 to 1.5 cm irregular  filling defect consistent with a finding on CT with some apparent  dilation of the upper pole calix suggesting location as the  infundibulum.  The lower pole was unremarkable.   A guidewire was then passed to the kidney.  An attempt was made to pass  a 12-French introducer sheath dilator.  However, this would not pass  more than approximately 2 cm up the ureter.   A 10 cm balloon dilation catheter was then passed through the cystoscope  over the wire and was inflated in the distal ureter to 12 atmospheres  and no evidence of a waist.   The digital flexible ureteroscope was then passed over the wire and  advanced to the kidney.  The wire was  removed and the kidney was  inspected.  Inspection revealed a papillary filling defect in the upper  pole caliceal infundibulum with a small satellite tumor more proximally  within the calix.  The tumor appeared to low to moderate grade.  No  other abnormalities were noted in the collecting system.   Saline was used to perform a barbotage cytology, as I did not have an  access sheath I did not feel a cup biopsies would be practical and I had  video documentation of the presence of neoplasm.   After barbotage cytologies were performed, a guidewire was reinserted  through the scope and the scope was removed.  The cystoscope was  reinserted over the wire and a 6-French 26 cm double-J stent was passed  to the kidney over the wire under fluoroscopic guidance.  The wire was  removed leaving a good coil in the kidney and a good coil in the  bladder.  At this point the bladder was drained.  The patient taken  down  from lithotomy position.  His anesthetic was reversed.  He was moved to  the recovery room in stable condition.   He will need to have a nephroureterectomy for management of his right  renal transitional cell carcinoma and I will discuss his case with Dr.  Laverle Patter in preparation for him having a laparoscopic approach to the  nephroureterectomy.   His discharge medications include Cipro 250 mg b.i.d. #6, Pyridium 200  mg p.o. t.i.d. p.r.n. burning #30 with a refill and Vicodin 1 to 2 p.o.  q. 4 to 6 hours p.r.n. pain #20.  He will be kept off his Coumadin for  the time being until we determined what his surgical scheduling will be.  He has follow-up on August 18 at 8:45 and we will consider removed the  stent at that time.      Excell Seltzer. Annabell Howells, M.D.  Electronically Signed     JJW/MEDQ  D:  02/10/2009  T:  02/10/2009  Job:  [id number]   cc:   Colleen Can. Deborah Chalk, M.D.  Fax: [phone]   Deirdre Peer. Polite, M.D.   Ollen Gross, M.D.  Fax: [phone]   Thora Lance, M.D.  Fax: [phone]

## 2010-11-19 NOTE — H&P (Signed)
NAME:  [name], Michael              ACCOUNT NO.:  [id number]   MEDICAL RECORD NO.:  [id number]           PATIENT TYPE:   LOCATION:                                 FACILITY:   PHYSICIAN:  Colleen Can. Deborah Chalk, M.D.    DATE OF BIRTH:   DATE OF ADMISSION:  05/18/2005  DATE OF DISCHARGE:                                HISTORY & PHYSICAL   CHIEF COMPLAINT:  None.   HISTORY OF PRESENT ILLNESS:  Mr. Mitton is a pleasant 77 year old white  male.  He has chronic atrial fibrillation and has had a pacemaker in place  since 1988.  He has had replacement of the pulse generator in June of 1997.  He has basically done well since that time.  He was seen in regular  pacemaker clinic after having a transtelephonic pacemaker check which  demonstrated end-of-life parameters.  He was brought to the office on  November 7 where his pacemaker was interrogated and was indeed noted to be  at end-of-life.  He now presents for elective generator replacement.  Clinically he has done well with no cardiac complaints.   PAST MEDICAL HISTORY:  1.  Chronic atrial fibrillation.  2.  Single chamber pacemaker with original implantation in 1988, subsequent      replacement of the generator in June of 1997 with a Medtronic SR [id number].  3.  Chronic Coumadin therapy.  4.  History of pneumonia.  5.  Microscopic hematuria.  6.  BPH.  7.  Probable esophageal reflux.  8.  Obstructive sleep apnea.  9.  History of tricuspid regurgitation.  His last echocardiogram was in      September of 2002 which showed LVH, left and right atrial enlargement,      mild TR, 2+ MR.  Ejection fraction was 55%.  10. He has had a past history of shortness of breath and underwent a normal      adenosine Cardiolite in 2002.   ALLERGIES:  None.   [place] MEDICATIONS:  1.  Coumadin 5 x5, 2.5 x2.  2.  Atenolol 25 mg one and a half tablets daily.  3.  Doxazosin 8 mg a day.  4.  Klonopin 0.75 daily.  5.  Lasix 40 daily.  6.  Reglan 5 mg a.c. and  h.s.  7.  Vivactil 10 mg a day.  8.  Baby aspirin daily.  9.  Rhinocort daily.  10. Generic Zocor 10 mg a day.   FAMILY HISTORY:  Father died at 94 with hypertension and stroke.  Mother  died at 81 with a stroke.   SOCIAL HISTORY:  He is married, but currently undergoing separation  proceedings.  He has no [place] alcohol or tobacco.   REVIEW OF SYSTEMS:  Basically as noted above and is otherwise unremarkable.   PHYSICAL EXAMINATION:  GENERAL:  He is a pleasant elderly white male who  appears somewhat younger than his stated age.  VITAL SIGNS:  Blood pressure 130/90 sitting, 120/80 standing, heart rate is  68 and irregular, respirations 18.  He is afebrile.  SKIN:  Warm and dry.  Color is unremarkable.  LUNGS:  Basically clear.  HEART:  Irregular rhythm.  Pacemaker is in the right pectoral chest and is  satisfactory.  EXTREMITIES:  Without edema.  NEUROLOGIC:  Intact.  There are no gross focal deficits.   Pertinent laboratories are pending.   OVERALL IMPRESSION:  1.  End-of-life pulse generator.  2.  Chronic atrial fibrillation.  3.  Chronic Coumadin.   PLAN:  Will proceed on with generator replacement.  The procedure has been  reviewed in full detail and he is willing to proceed on Wednesday, May 18, 2005.      Sharlee Blew, N.P.      Colleen Can. Deborah Chalk, M.D.  Electronically Signed    LC/MEDQ  D:  05/11/2005  T:  05/11/2005  Job:  [id number]   cc:   Barry Dienes. Eloise Harman, M.D.  Fax: [phone]

## 2010-11-19 NOTE — Assessment & Plan Note (Signed)
[facility]                               PULMONARY OFFICE NOTE   NAME:[name], [name]                     MRN:          [id number]  DATE:02/06/2006                            DOB:          May 28, 1930    PROBLEM:  1.  Obstructive sleep apnea/CPAP.  2.  Excessive day-time somnolence.  3.  Cheyne-Stokes respiration.  4.  Rhinitis.  5.  Bronchitis.  6.  Esophageal reflux.  7.  Cardiomyopathy/atrial fibrillation/pacemaker/Coumadin.  8.  Restless legs.   HISTORY:  He continues his CPAP at what I thought was 12 and he says is 7  CWP.  We are checking with [facility] but he feels comfortable with it.  His wife  tells him he is jerking his legs worse at night.  He is unaware of it.  He  increased Klonopin in response to her complaint.  We had him at last visit  up from 0.5 to 0.75 mg at bedtime.  He likes Vivactil, taken 10 mg in the  a.m.  It helps alert him comfortably.  He had a new pacemaker this year and  says that functions well.  He estimates he is getting 8 hours of sleep  counting a 1 hour nap after his morning walk each day.  He is a little aware  of nocturnal awakening and denies confusion.  He does not want to try Requip  or Mirapex for the leg jerks, I think partly because of cost and instead  wants an additional increase in Klonopin.  This opened a discussion of  tolerance, dependence, etc.  We discussed CPAP mask styles and related  issues.   MEDICATIONS:  1.  CPAP.  2.  Atenolol 25 mg.  3.  Coumadin 5 mg.  4.  Doxazosin 8 mg.  5.  Klonopin 0.75 mg.  6.  Lasix 40 mg.  7.  Lipitor 5 mg.  8.  Reglan 5 mg x4.  9.  Rhinocort has been discontinued.  10. Vivactil 10 mg q. a.m.  11. Aspirin.  12. Nasonex.  13. Valtrex 500 mg.   ALLERGIES:  No medication intolerance.   OBJECTIVE:  VITAL SIGNS:  Weight 186 pounds.  Blood pressure 102/64.  Pulse  regular 55, regulated to my palpation.  Oxygen saturation 99% on room air.  GENERAL:  He is  alert and seems comfortable.  There are no pressure marks on  his face from the mask.  NEUROLOGIC:  Unremarkable to observations.  Specifically without tremor,  jiggling or evident restlessness.  LUNGS:  Breathing is unlabored.   IMPRESSION:  1.  Obstructive sleep apnea.  Seems comfortably controlled.  We need to      double check on what pressure records CPAP shows at [facility].  2.  Restless legs possibly with some increased tolerance to Kovich      Clonazepam.  We can go up a little more on the dose of that but I have      discussed considerations and options with him.   PLAN:  1.  Increase Clonazepam to 0.5 mg x2 q.h.s.  2.  He  will continue working with [facility] and we will call them with      pressures.  3.  Continue Vivactil at 10 mg q. a.m.  4.  Schedule return in 12 months, earlier as needed.                                   Clinton D. Maple Hudson, MD, [facility], FACP   CDY/MedQ  DD:  02/08/2006  DT:  02/08/2006  Job #:  [id number]   cc:   Thora Lance, MD  Colleen Can. Deborah Chalk, MD

## 2010-11-19 NOTE — H&P (Signed)
[place]  Patient:    RONAV, FURNEY                       MRN: [id number] Adm. Date:  [id number] Attending:  Garlan Fillers CC:         Colleen Can. Deborah Chalk, M.D.   History and Physical  CHIEF COMPLAINT:  Bilateral leg edema and calf pain.  HISTORY OF PRESENT ILLNESS:  Patient is a 77 year old white male who, on Saturday (January 26th), began to have chills with a low-grade fever and mild bilateral leg aching discomfort.  His symptoms persisted this week, leading to an evaluation in my office on Monday, July 31, 2000.  At that evaluation, a chest x-ray was done, which showed no evidence of acute cardiopulmonary disease, and a CBC was drawn, which showed a normal WBC count, so Tylenol was prescribed for a possible flu syndrome.  His symptoms persisted with increasing bilateral leg edema and became associated with mild shortness of breath today, so he was advised to come to the [facility] Emergency Room.  He had just started Zithromax earlier today for a possible early pneumonia.  PAST MEDICAL HISTORY:  His past medical history is significant for chronic atrial fibrillation, chronic anticoagulation, pacemaker dependency due to sick sinus syndrome, cerebrovascular disease with transient ischemic attacks in July 2000 but no clinical stroke -- a CT scan at that time showed a lacunar infarct that was old -- and obstructive sleep apnea.  He has no history of dependent leg edema.  MEDICATIONS PRIOR TO ADMISSION 1. Atenolol 25 mg p.o. q.d. 2. Cardura 8 mg p.o. q.d. 3. Klonopin 1.5 mg p.o. q.d. p.r.n. sleep. 4. Coumadin 7.5 mg p.o. once weekly and 5.0 mg p.o. on other days of the week. 5. Aspirin 81 mg p.o. q.d. 6. Atrovent metered-dose inhaler as needed. 7. CPAP set at 11 cmH20.  ALLERGIES:  No known drug allergies.  PAST SURGICAL HISTORY:  In 1988 and 1997, he had permanent pacemaker placement and he has a remote history of bilateral  inguinal hernia repairs; he also has a remote history of a knee operation, he is not certain which knee he was operated on.  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  He has no history of tobacco or alcohol abuse.  FAMILY HISTORY:  Father died at age 33 years of cerebrovascular accident.  His mother died at age 43 years of cerebrovascular accident.  There is no family history of colon cancer, diabetes mellitus or premature coronary artery disease.  REVIEW OF SYSTEMS:  He has noted a weight change of an increase in 5 pounds this past week; he has also had night sweats with low-grade fevers and mild shortness of breath.  He also has bilateral aching calf pain (right greater than left).  He denies chest discomfort, nausea, constipation, urinary hesitancy or rectal bleeding.  PHYSICAL EXAMINATION  VITALS:  Blood pressure 148/78, pulse 78, respiratory rate 16, temperature 100.4 degrees Fahrenheit.  Pulse oxygen saturation 97% on room air.  GENERAL:  He is a well-developed, well-nourished white male in no apparent distress who is alert and oriented x 4.  HEENT:  Pupils were equal, round and reactive to light and accommodation. Extraocular movements were intact.  Throat was normal.  NECK:  Supple with a full range of motion.  There was no jugular venous distention or carotid bruit.  CHEST:  Clear to auscultation.  HEART:  Regular rate and rhythm,  S1 and S2, without gallop or rub.  There was a systolic ejection murmur, grade 1/6, at the left sternal border.  ABDOMEN:  Abdomen had normal bowel sounds with no hepatosplenomegaly or tenderness and there was no inguinal lymphadenopathy.  RECTAL:  Deferred.  EXTREMITIES:  Bilateral 1+ pitting edema (right leg greater than left).  NEUROLOGIC:  Exam was nonfocal.  LABORATORY AND X-RAY STUDIES:  Serum sodium 143, potassium 4.2, chloride 109, CO2 26, BUN 23, creatinine 0.8, glucose 122.  INR 3.2.  WBC 4.0, hemoglobin 12.0,  hematocrit 35.8, platelet count 156,000.  ______.  Fibrin degradation products 0.69 (normal value 0.0 to 0.48).  Serum sodium 143, potassium 4.2, chloride 109, BUN 23, glucose 122, creatinine 0.8.  EKG showed 100% paced beats with an underlying rhythm of atrial fibrillation.  A chest x-ray was done in our office on July 31, 2000 and showed cardiomegaly, with pacemaker leads in the appropriate position and no evidence of acute cardiopulmonary disease.  IMPRESSION AND PLAN 1. Fever:  This is likely secondary to a viral syndrome (influenza).  A    pulmonary embolism is also possible, given his elevated fibrin degradation    products; however, his therapeutic INR argues against this and his lack of    hypoxia or tachypnea.  I plan to obtain a deep venous thrombosis ultrasound    in the morning and check his urinalysis and urine cultures to rule out the    unlikely possibility of a urinary tract infection. 2. Atrial fibrillation:  This is stable on his Rigg medications. 3. Cerebrovascular disease:  This is stable with no recent transient ischemic    attacks on Coumadin and aspirin. 4. Obstructive sleep apnea:  This appears to be stable on nightly continuous    positive airway pressure, which will be continued as an inpatient. DD:  08/03/00 TD:  08/04/00 Job: [id number]

## 2010-11-19 NOTE — Discharge Summary (Signed)
[place]  Patient:    [name], [name]                       MRN: [id number] Adm. Date:  [id number] Disc. Date: [id number] Attending:  Garlan Fillers                           Discharge Summary  HISTORY OF PRESENT ILLNESS:  The patient is a 77 year old white male with multiple medical problems.  He was admitted from the [facility] Emergency Room with complaints of bilateral lower extremity edema associated with mild shortness of breath.  He has multiple medical problems including chronic atrial fibrillation for which he is on Coumadin, and he is pacemaker-dependent for multiple years.  He also has a history of obstructive sleep apnea for which he is on CPAP nightly. He complained of increasing bilateral lower extremity edema with mild shortness of breath lately.  PHYSICAL EXAMINATION:  VITAL SIGNS:  Blood pressure 148/78, pulse 78, respiratory rate 16, temperature 100.4, oxygen saturation 97% on room air.  GENERAL:  Well-nourished, well-developed white male, in no apparent distress. Alert and oriented x 4.  HEENT:  Within normal limits.  CHEST:  Clear to auscultation.  HEART:  Irregularly irregular rhythm, S1, S2, without murmur, gallop, or rub.  ABDOMEN:  Normal bowel sounds with no hepatosplenomegaly or tenderness.  EXTREMITIES:  Bilateral 1+ pitting edema to the knees.  NEUROLOGIC:  Nonfocal.  LABORATORY DATA:  White blood cell count was 4000, hemoglobin 11, hematocrit 33, platelet count 156, MCV 94.  PT 27, INR 3.8, d-dimer level 0.69.  Serum sodium 139, potassium 4.2, chloride 107, CO2 26, BUN 13, creatinine 1.0, glucose 126, alkaline phosphatase 115, SGOT 24, total protein 6.0, albumin 2.9.  Urinalysis revealed specific gravity 1.017, protein negative.  HOSPITAL COURSE:  The patient was admitted to a medical bed with telemetry. He continued to show chronic atrial fibrillation with 100% paced beats.  He had several procedures done  including a DVT ultrasound that showed no evidence of a deep vein thrombosis, with an old right lower extremity branch of the greater saphenous vein, superficial thrombosis.  It was nonocclusive and of uncertain age.  A CT scan of the abdomen and pelvis showed no evidence of inferior vena cava thrombus, with minimal ascites and no pathologically enlarged lymph nodes.  Chest x-ray showed cardiomegaly with left basilar atelectasis versus small effusion.  A August 07, 2000, echocardiogram was done, with results pending at the time of dictation.  COMPLICATIONS:  None.  CONDITION ON DISCHARGE:  He feels fine.  He has no significant shortness of breath and no chest discomfort.  DISCHARGE DIAGNOSES: 1. Bilateral lower extremity edema, likely secondary to chronic venous    insufficiency with pulmonary hypertension. 2. Obstructive sleep apnea on continuous positive airway pressure treatment. 3. Chronic atrial fibrillation. 4. Anticoagulation. 5. Pacemaker-dependent. 6. Minimal ascites of uncertain etiology. 7. Osteoarthritis of the knees. 8. Hypertension.  DISCHARGE MEDICATIONS: 1. Atenolol 25 mg p.o. q.d. 2. Cardura 8 mg p.o. q.d. 3. Klonopin 1.5 mg p.o. q.d. p.r.n. 4. Coumadin 5.0 mg p.o. q.d. for six days, and 7.5 mg p.o. q.d. one day per    week. 5. Aspirin 81 mg p.o. q.d. 6. Atrovent metered dose inhaler 2 puffs p.o. q.i.d. p.r.n. dyspnea. 7. Maxzide 25 1 tablet p.o. q.a.m. 8. Vicodin 1-2 tablets p.o. q.d. p.r.n. pain, #60, 1 refill. 9. Colace 100 mg p.o. b.i.d.  SPECIAL INSTRUCTIONS:  He should have his Coumadin levels rechecked by Dr. Deborah Chalk per his usual schedule.  FOLLOW-UP:  He should be seen in approximately two weeks by Dr. Eloise Harman at [facility].  He was given a telephone number to call to schedule that appointment. DD:  08/21/00 TD:  08/21/00 Job: [id number]

## 2010-11-19 NOTE — Procedures (Signed)
NAME:  [name], [name]              ACCOUNT NO.:  [id number]   MEDICAL RECORD NO.:  [id number]          PATIENT TYPE:  OUT   LOCATION:  SLEEP CENTER                 FACILITY:  [facility]   PHYSICIAN:  Clinton D. Maple Hudson, MD, FCCP, FACPDATE OF BIRTH:  13-Oct-1931   DATE OF STUDY:  03/09/2006                              NOCTURNAL POLYSOMNOGRAM   REFERRING PHYSICIAN:  Dr. Jetty Duhamel.   INDICATION FOR STUDY:  Hypersomnia with sleep apnea.   EPWORTH SLEEPINESS SCORE:  10/24.  BMI 26.  Weight 183 pounds.  The patient  has a history of sleep apnea and C-PAP.  A split study was requested.  Home  medications are listed on chart.  Note that he usually takes an afternoon  nap and has a late bedtime as routine habit.   SLEEP ARCHITECTURE:  Total sleep time short, 184 minutes with sleep  efficiency 49%.  Stage I was 34%, stage II 64%, stages III and IV were  absent, REM 1% of total sleep time.  Sleep latency 67 minutes, REM latency  205 minutes, awake after sleep onset 101 minutes, arousal index 16.9.  Clonazepam, doxazosin, finasteride and warfarin were taken at 21:45 p.m.  Sleep was fragmented by frequent wakings throughout the night.   RESPIRATORY DATA:  Apnea/hypopnea index (AHI, RDI) 39.7 obstructive events  per hour indicating moderately severe obstructive sleep apnea/hypopnea  syndrome.  There were 1 central apnea, 113 obstructive apneas, 5 mixed  apneas and 3 hypopneas.  Most sleep and all events were while supine.  REM  AHI 72 per hour.  He had insufficient sleep to permit C-PAP titration by  split protocol on this study night.   OXYGEN DATA:  Moderate snoring with oxygen desaturation to a nadir of 88%.  Mean oxygen saturation through the study was 95% on room air.   CARDIAC DATA:  Pacemaker with frequent extra PVCs.   MOVEMENT/PARASOMNIAS:  Occasional limb jerk with arousal, probably  insignificant.   IMPRESSION/RECOMMENDATIONS:  1. Markedly fragmented sleep with frequent wakings and  short total sleep      time.  Recognize that he is used to sleeping with C-PAP but this night      without it through so these events may be the result of his sleep      apnea.  There was however insufficient sustained sleep to permit C-PAP      titration by standard split study protocol on this study night.  2. Moderately severe obstructive sleep apnea/hypopnea syndrome, AHI 39.7      per hour with most events while supine.  Moderate snoring with oxygen      desaturation to a nadir of 80%.  3. It may be sufficient to titrate his existing C-PAP for best pressure      settings.  Explore whether he needs additional help to consolidate      sleep, recognizing he had already taken clonazepam for this study.      Clinton D. Maple Hudson, MD, [facility], FACP  Diplomate, Biomedical engineer of Sleep Medicine  Electronically Signed     CDY/MEDQ  D:  03/11/2006 13:00:59  T:  03/12/2006 04:54:09  Job:  [id number]

## 2010-11-19 NOTE — Consult Note (Signed)
[place]. [facility]  Patient:    [name], [name]                       MRN: [id number] Proc. Date: 08/04/00 Adm. Date:  08/05/00 Attending:  Darci Needle, M.D. CC:         Vania Rea. Jarold Motto, M.D. [facility] N. Deborah Chalk, M.D.   Consultation Report  CONCLUSIONS: 1. Bilateral lower extremity swelling right leg greater than left with calf    pain.  Negative DVT Doppler study.  Etiology uncertain. 2. Sick sinus syndrome.    a. History of VVI pacemaker    b. Chronic atrial fibrillation. 3. Dyspnea, etiology uncertain.  Rule out pericardial disease.  Rule out    pulmonary hypertension.  Rule out left ventricular dysfunction,. 4. History of sleep apnea syndrome.    a. Wears CPAP mask.    b. Rule pulmonary hypertension. 5. History of cerebrovascular disease.    a. History of lacunar infarct 2000.  RECOMMENDATIONS: 1. 2-D Doppler echocardiogram to rule out pulmonary hypertension and LV    dysfunction and pericardial effusion. 2. Repeat PA and lateral chest x-ray with radiologists interpretation.  COMMENTS:  The patient is a 77 year old and was admitted to the hospital with lower extremity swelling right greater than left and lower extremity pain.  He states that also during the lower extremity pain he was having lower back discomfort.  A DVT lower extremity Doppler study was negative for evidence of DVT.  He also has a abdominal CT which according to the patients stepdaughter showed "pleural effusions" and ascites.  She is a Designer, jewellery and has apparently gotten this information although I am not certain of the source.  I reviewed these studies with the radiologist and he does have minimal flank fluid on abdominal CT.  There are small bilateral pleural effusions on the upper abdominal cuts.  The patients wife felt that he was dyspneic tonight.  He denied feeling dyspneic.  Apparently there was an 02 sat determination on room air that was 92%.   He absolutely denies chest discomfort.  There is no history of coronary atherosclerotic heart disease.  There is a history of cardiomegaly, sick sinus syndrome and chronic atrial fibrillation.  He is on chronic Coumadin therapy and also atenolol, klonopin, Hytrin.  PHYSICAL EXAMINATION:  GENERAL:  The patient was in no distress.  VITAL SIGNS:  His heart rate was 70, blood pressure 132/70.  NECK:  Neck veins appear distended with the patient lying at 30 degrees and there was no real pulsations noted.  CHEST:  Revealed faint bibasilar rales.  CARDIAC:  Reveals no murmur or gallop.  ABDOMEN:  Soft.  Bowel sounds were normal.  NEUROLOGIC:  Revealed the patient was somewhat lethargic.  No focal cranial nerve deficits were noted.  His affect was somewhat bland.  EKG revealed atrial fibrillation as his underlying atrial mechanism with ventricular pacing.  The CT results are as outlined.  PT was 27 seconds with an INR of 3.8.  White blood cell count 4,000.  Hemoglobin 12. DD:  08/05/00 TD:  08/07/00 Job: [id number]

## 2010-11-19 NOTE — Discharge Summary (Signed)
[place]. [facility]  Patient:    [name], [name]                       MRN: [id number] Adm. Date:  [id number] Disc. Date: [id number] Attending:  Young, Sports administrator Driver CC:         Barry Dienes. Eloise Harman, M.D.  Colleen Can. Deborah Chalk, M.D.   Discharge Summary  DISCHARGE DIAGNOSES: 1. Right middle lobe pneumonia, probable aspiration. 2. Chronic atrial fibrillation. 3. Cardiac pacemaker. 4. Longterm anticoagulation. 5. Asthmatic bronchitis. 6. Esophageal dismotility, probably esophageal reflux. 7. Obstructive sleep apnea.  BRIEF HISTORY:  A 77 year old nonsmoker with a history of increased cough two weeks prior to admission with green, nonbloody sputu, single tooth-chattering chill.  Chest x-ray in the emergency room showed right middle lobe infiltrates new in comparison with February.  Significant history of chronic atrial fibrillation on Coumadin with pacemaker for sick sinus syndrome, no myocardial infarction.  Two-dimensional echocardiogram in February of 2002 showed ejection fraction 55 to 65% with no wall motion abnormality.  Marked right atrial enlargement, pulmonary artery hypertension with systolic pressure 45. Previously documented obstructive sleep apnea treated with CPAP.  No history of pulmonary embolus.  PHYSICAL EXAMINATION:  Significant for temperature of 102.6, blood pressure 119/68, pulse 72, room air saturation 95%, respirations tachypneic.  LUNGS: There were rhonchi in the left posterior chest, much quieter on the right. HEART: Heart sounds were irregular with frequent paced beats by monitor. There was no peripheral edema.  Initial impression was community acquired pneumonia and because he has had repeated episodes and gave a clear history of recognizing reflux in bed at night, aspiration pneumonia is suspected.  There had been previous concern that he might have abdominal distention and evaluation in February had indicated a very small  amount of ascites, but this was not felt to be a significant issue as the 77 admission proceeded.  HOSPITAL COURSE:  He was managed with aspiration precautions and his outpatient medications were continued.  Cultures were obtained and supplemental oxygen provided.  Antibiotic coverage was provided with Tequin. Speech pathology coordinated a modified barium swallow and standard barium swallow for dysphagia and reflux evaluation.  Findings mild to moderate pharyngeal residue after pureed consistency swallowing, but no frank aspiration or reflux.  Swallowing function looked normal, but there was diminished parastalsis in the esophagus suggesting early presbyesophagus. Nutritional consultation was obtained for diet modification to improve his mild hypoalbuminemia.  Outpatient medications were continued.  At discharge he had mild residual bronchitic cough.  Chest x-ray had suggested clearing in the right lower lobe right middle lobe area.  Swallowing instructions and reflux precautions had been emphasized and he was treated with bronchodilators.  It was thought likely that he had had recurrent episodes of reflux with aspiration pneumonitis.  LABORATORY DATA:  ABG on December 06, 2000, on room air; pH 7.45, pCO2 35.9, pO2 77, bicarbonate 25.  White blood cell count 10,000, hematocrit 39, hemoglobin 13.3, normal platelet count.  90% PMNs, sedimentation rate elevated at 33. PT/INR on admission was 2.3 on Coumadin.  PTT 44.  Electrolyte profiles were negative and the only abnormal chemistry was an albumin borderline at 3.2. Total bilirubin slightly elevated at 1.5.  Cardiac enzymes normal.  Urinalysis unremarkable.  Blood cultures negative.  ANA negative.  Antineutrophil cytoplasm antibody was within normal limits at less than 1 to 16 titer.  EKG showed demand pacemaker, right bundle branch block, marked T wave abnormality, consider inferior ischemia.  Consider  pacing artifact.  Chest x-ray  showed COPD with cardiomegaly, gradual improvement in the right base.  DISCHARGE MEDICATIONS:  1. Advair diskus 100/50 one inhalation b.i.d.  2. Atrovent inhaler two puffs q.i.d. p.r.n.  3. Tequin 400 mg q.d. x 5 days.  4. Traimeterene/HCTZ (Maxzide) 75 mg one daily.  5. Atenolol 25 mg daily.  6. Doxazosin 8 mg q.d.  7. Coumadin 2.5 mg on Monday and Thursday, 5 mg all other days.  8. Aspirin 81 mg q.d.  9. Reglan 5 mg before each meal and at bedtime. 10. Clonazepam 0.5 mg to take 1 or 1-1/2 tablets at bedtime if needed for     sleep, try to do without it. 11. CPAP for obstructive sleep apnea to continue with 11 cm of pressure at     night.  DIET:  No added salt.  ACTIVITY:  Unrestricted with instructions on careful swallowing as instructed by speech therapy.  Elevate head of bed with bricks.  Keep scheduled appointments. DD:  12/20/00 TD:  12/20/00 Job: [id number]

## 2010-11-19 NOTE — Cardiovascular Report (Signed)
NAME:  [name], [name]              ACCOUNT NO.:  [id number]   MEDICAL RECORD NO.:  [id number]          PATIENT TYPE:  OIB   LOCATION:  2899                         FACILITY:  [facility]   PHYSICIAN:  Colleen Can. Deborah Chalk, M.D.DATE OF BIRTH:  02/11/1930   DATE OF PROCEDURE:  05/18/2005  DATE OF DISCHARGE:  05/18/2005                              CARDIAC CATHETERIZATION   PROCEDURE:  Pulse generator change out secondary to end-of-life of previous  pulse generator.   PROCEDURE:  The old pulse generator was located on the right side chest and  subclavicular area. The old generator was a Medtronic [id number], serial number  [id number], implanted on December 11, 1995. The lead was a Medtronic 4003-58 cm  lead, serial number [id number] implanted in  March 18, 1987.   The area of the old pulse generator was prepped and draped and infiltrated  with 1% Xylocaine. Using sharp and Bovie dissection, we were able locate the  old pulse generator. The old pulse generator was surrounded by very thick  capsule and was difficult to remove and using mainly Bovie dissection we  were able to locate the old pulse generator and enlarge the pocket enough to  receive then new pulse generator. The thresholds on the old lead were 1.2  volts at 0.5 milliseconds pulse width to capture and R-wave was 5.7 mV.  Impedance was 352 ohms and the [place] was 3.5 mA. The lead was connected to  a Medtronic impulse [id number], serial number [id number]. The wound was flushed  copiously with kanamycin solution. The wound was closed with 2-0 and  subsequently 4-0 Vicryl. Steri-Strips were applied. Overall, the patient  tolerated the procedure well.      Colleen Can. Deborah Chalk, M.D.  Electronically Signed     SNT/MEDQ  D:  05/18/2005  T:  05/18/2005  Job:  [id number]   cc:   Barry Dienes. Eloise Harman, M.D.  Fax: [phone]

## 2010-11-22 ENCOUNTER — Encounter: Payer: Self-pay | Admitting: *Deleted

## 2010-11-24 NOTE — Progress Notes (Signed)
Pacer remote check  

## 2010-12-03 ENCOUNTER — Ambulatory Visit (INDEPENDENT_AMBULATORY_CARE_PROVIDER_SITE_OTHER): Payer: Medicare Other | Admitting: *Deleted

## 2010-12-03 ENCOUNTER — Encounter: Payer: Medicare Other | Admitting: *Deleted

## 2010-12-03 DIAGNOSIS — I4891 Unspecified atrial fibrillation: Secondary | ICD-10-CM

## 2010-12-03 DIAGNOSIS — R0989 Other specified symptoms and signs involving the circulatory and respiratory systems: Secondary | ICD-10-CM

## 2010-12-03 LAB — POCT INR: INR: 1.9

## 2010-12-09 ENCOUNTER — Encounter: Payer: Self-pay | Admitting: Internal Medicine

## 2010-12-10 ENCOUNTER — Encounter: Payer: Self-pay | Admitting: Internal Medicine

## 2010-12-10 ENCOUNTER — Ambulatory Visit (INDEPENDENT_AMBULATORY_CARE_PROVIDER_SITE_OTHER): Payer: Medicare Other | Admitting: Internal Medicine

## 2010-12-10 ENCOUNTER — Other Ambulatory Visit: Payer: Self-pay | Admitting: Internal Medicine

## 2010-12-10 VITALS — BP 110/64 | HR 61 | Ht 70.0 in | Wt 183.8 lb

## 2010-12-10 DIAGNOSIS — G4733 Obstructive sleep apnea (adult) (pediatric): Secondary | ICD-10-CM

## 2010-12-10 DIAGNOSIS — G2581 Restless legs syndrome: Secondary | ICD-10-CM

## 2010-12-10 NOTE — Assessment & Plan Note (Addendum)
We will continue CPAP at 7 - he is doing very well We don't know if nortriptylline is useful or affects his vision, so we will try to taper off.

## 2010-12-10 NOTE — Progress Notes (Signed)
  Subjective:    Patient ID: [name], male    DOB: 03/11/1930, 77 y.o.   MRN: [id number]  HPI 12/10/10- 61 yoM never smoker followed for OSA, hx Cheynes-Stokes, Restless legs, complicated by CM/ AFib/ brady-tachy/ pacer, allergic rhinitis, bronchitis, GERD. Hx right nephrectomy for cancer. Last here December 31, 2009 - note reviewed  CPAP continues to work fine, 7 cwp.  He likes his new machine.  Nortrpityline was given originally for depression and sleep. We discussed the possibility it was blurring his vision a little. He denies depression now.  He sleeps very well and is unaware of any limb movement disturbance now.  Review of Systems Constitutional:   No weight loss, night sweats,  Fevers, chills, fatigue, lassitude. HEENT:   No headaches,  Difficulty swallowing,  Tooth/dental problems,  Sore throat,                No sneezing, itching, ear ache, nasal congestion, post nasal drip,   CV:  No chest pain,  Orthopnea, PND, swelling in lower extremities, anasarca, dizziness, palpitations  GI  No heartburn, indigestion, abdominal pain, nausea, vomiting, diarrhea, change in bowel habits, loss of appetite  Resp: No shortness of breath with exertion or at rest.  No excess mucus, no productive cough,  No non-productive cough,  No coughing up of blood.  No change in color of mucus.  No wheezing.  Skin: no rash or lesions.  GU: no dysuria, change in color of urine, no urgency or frequency.  No flank pain.  MS:  No joint pain or swelling.  No decreased range of motion.  No back pain.  Psych:  No change in mood or affect. No depression or anxiety.  No memory loss.      Objective:   Physical Exam General- Alert, Oriented, Affect-appropriate, Distress- none acute   Skin- rash-none, lesions- none, excoriation- none  Lymphadenopathy- none  Head- atraumatic  Eyes- Gross vision intact, PERRLA, conjunctivae clear secretions  Ears- Hearing, canals, Tm- normal  Nose- Clear, No-Septal dev,  mucus, polyps, erosion, perforation   Throat- Mallampati III- bifid uvula , mucosa clear , drainage- none, tonsils- atrophic  Neck- flexible , trachea midline, no stridor , thyroid nl, carotid no bruit  Chest - symmetrical excursion , unlabored     Heart/CV- RRR , 2/6 systolic murmur , no gallop  , no rub, nl s1 s2                     - JVD- none , edema- none, stasis changes- none, varices- none     Lung- clear to P&A, wheeze- none, cough- none , dullness-none, rub- none     Chest wall- Pacemaker  Abd- tender-no, distended-no, bowel sounds-present, HSM- no  Br/ Gen/ Rectal- Not done, not indicated  Extrem- cyanosis- none, clubbing, none, atrophy- none, strength- nl  Neuro- grossly intact to observation. No jiggling         Assessment & Plan:

## 2010-12-10 NOTE — Patient Instructions (Addendum)
Try tapering off nortriptyline gradually- every other day for 2 weeks, then stop.    See if it affects your vision or your mood.  Continue CPAP at 7

## 2010-12-10 NOTE — Telephone Encounter (Signed)
CDY has already left for the day. Per MR- okay to refill klonopin enough to last until CDY returns on 12/13/10.  Will forward msg to Dr Maple Hudson so that he is aware and can approve additional rx. LMOM for pt to be aware this was done. Dr Maple Hudson pls advise if okay to send additional rx thanks!

## 2010-12-10 NOTE — Telephone Encounter (Signed)
Patient paging PCCM MD - me on call. Says some klonopin paper work to [employer] needs to be sent and it has been 4 days

## 2010-12-12 NOTE — Assessment & Plan Note (Signed)
He is not aware of limb movement disturbance now while awake or asleep.

## 2010-12-12 NOTE — Telephone Encounter (Signed)
Ok to refill his previous script for clonazepam, refillable x 5 = 6 months

## 2010-12-13 ENCOUNTER — Telehealth: Payer: Self-pay | Admitting: Internal Medicine

## 2010-12-13 MED ORDER — CLONAZEPAM 2 MG PO TABS: 2.0000 mg | ORAL_TABLET | Freq: Every evening | ORAL | Status: DC | PRN

## 2010-12-13 NOTE — Telephone Encounter (Signed)
rx sent to pharmacy for # 30 x 5 refills. Pt aware.

## 2010-12-13 NOTE — Telephone Encounter (Signed)
rx sent to pharmacy for # 30 x 5 refills.   See refill phone message request for complete details.

## 2010-12-15 ENCOUNTER — Telehealth: Payer: Self-pay | Admitting: Internal Medicine

## 2010-12-15 NOTE — Telephone Encounter (Signed)
Called and spoke with pt.  Pt states he went to pharmacy on Monday to pick up Clonazepam refill and states he was only given 30 tabs.  Pt states CY had always given him # 100 tabs previously and is wanting to know why he was only given #30 and if next month he can get a # 100 instead with refills.  Will forward message to CY to address.   FYI: I did call [facility] and spoke with Sue Lush and did get verification that pt did only get # 30 on Monday June 11.

## 2010-12-15 NOTE — Telephone Encounter (Signed)
Spoke with pt and tried explaining this to him. He states that he only takes 1 2mg  tablet at bedtime , but is still requesting #100 tablets b/c he states that this cheaper at the pharmacy. Will forward msg back to him.

## 2010-12-15 NOTE — Telephone Encounter (Signed)
I think what happened is that he used to get 1 mg tabs, 2 qhs. When that was changed we went to 2 mg tabs and reduced the number. If he is to take 1-2 of the 2 mg tabs as needed at bedtime for dx insomnia and dx restless legs, that still would only be 60 per month. See if that works for him, in which case we can give 2 mg, # 60, ref x 5

## 2010-12-15 NOTE — Telephone Encounter (Signed)
OK- Let's give him 3 month script- Clonazepam 2 mg, 1 qhs prn sleep, # 100, to refill x 3 = 1 year supply.

## 2010-12-16 MED ORDER — CLONAZEPAM 2 MG PO TABS: 2.0000 mg | ORAL_TABLET | Freq: Every evening | ORAL | Status: DC | PRN

## 2010-12-16 NOTE — Telephone Encounter (Signed)
Called in rx to [facility].  This med will be kept on hold in their system for pt to call next month to ask for refill.  [id number] to inform pt of this.

## 2010-12-17 NOTE — Telephone Encounter (Signed)
lmomtcb  

## 2010-12-17 NOTE — Telephone Encounter (Signed)
Spoke with pt and notified of recs regarding rx. Pt verbalized understanding.

## 2010-12-31 ENCOUNTER — Encounter: Payer: Medicare Other | Admitting: *Deleted

## 2011-01-03 ENCOUNTER — Ambulatory Visit (INDEPENDENT_AMBULATORY_CARE_PROVIDER_SITE_OTHER): Payer: Medicare Other | Admitting: *Deleted

## 2011-01-03 ENCOUNTER — Telehealth: Payer: Self-pay | Admitting: Internal Medicine

## 2011-01-03 DIAGNOSIS — I4891 Unspecified atrial fibrillation: Secondary | ICD-10-CM

## 2011-01-03 LAB — POCT INR: INR: 2.2

## 2011-01-03 NOTE — Telephone Encounter (Signed)
lmomtcb x1 

## 2011-01-03 NOTE — Telephone Encounter (Signed)
PATIENT RETURNED CALL.  PLEASE CALL BACK AT [phone]

## 2011-01-03 NOTE — Telephone Encounter (Signed)
lmomtcb  

## 2011-01-04 NOTE — Telephone Encounter (Signed)
Spoke with pt and verified with him that we do in fact have in his records that he is taking coumadin, nothing further needed.

## 2011-01-24 ENCOUNTER — Telehealth: Payer: Self-pay | Admitting: Cardiology

## 2011-01-24 NOTE — Telephone Encounter (Signed)
Called because he forgot to order his Atenolol and was wondering how long he can go without it. Please call back. I have pulled his chart.

## 2011-01-24 NOTE — Telephone Encounter (Signed)
°  Called because he forgot to order his Atenolol and was wondering how long he can go without it. Please call back. I have pulled his chart.

## 2011-01-24 NOTE — Telephone Encounter (Signed)
Called stating he didn't realize that he was out of Atenolol. Reordered from [facility] on Sat. States his HR is good. Has pacemaker. Per Lawson Fiscal advised to restart as soon as possible. Should get med this week.

## 2011-02-03 ENCOUNTER — Encounter: Payer: Medicare Other | Admitting: *Deleted

## 2011-02-04 ENCOUNTER — Ambulatory Visit (INDEPENDENT_AMBULATORY_CARE_PROVIDER_SITE_OTHER): Payer: Medicare Other | Admitting: *Deleted

## 2011-02-04 DIAGNOSIS — I4891 Unspecified atrial fibrillation: Secondary | ICD-10-CM

## 2011-02-04 DIAGNOSIS — Z7901 Long term (current) use of anticoagulants: Secondary | ICD-10-CM | POA: Insufficient documentation

## 2011-02-04 LAB — POCT INR: INR: 2.5

## 2011-02-17 ENCOUNTER — Ambulatory Visit (INDEPENDENT_AMBULATORY_CARE_PROVIDER_SITE_OTHER): Payer: Medicare Other | Admitting: *Deleted

## 2011-02-17 DIAGNOSIS — I495 Sick sinus syndrome: Secondary | ICD-10-CM

## 2011-02-17 DIAGNOSIS — I4891 Unspecified atrial fibrillation: Secondary | ICD-10-CM

## 2011-02-17 DIAGNOSIS — I428 Other cardiomyopathies: Secondary | ICD-10-CM

## 2011-02-20 ENCOUNTER — Other Ambulatory Visit: Payer: Self-pay

## 2011-02-21 LAB — REMOTE PACEMAKER DEVICE
BATTERY VOLTAGE: 2.68 V
BMOD-0005RV: 95 {beats}/min
BRDY-0002RV: 60 {beats}/min

## 2011-02-25 ENCOUNTER — Encounter: Payer: Self-pay | Admitting: *Deleted

## 2011-03-02 NOTE — Progress Notes (Signed)
Pacer remote check  

## 2011-03-04 ENCOUNTER — Ambulatory Visit (INDEPENDENT_AMBULATORY_CARE_PROVIDER_SITE_OTHER): Payer: Medicare Other | Admitting: *Deleted

## 2011-03-04 DIAGNOSIS — I4891 Unspecified atrial fibrillation: Secondary | ICD-10-CM

## 2011-03-17 ENCOUNTER — Telehealth: Payer: Self-pay | Admitting: Internal Medicine

## 2011-03-17 NOTE — Telephone Encounter (Signed)
Spoke with pt and notified of recs per CDY. He verbalized understanding. States no need to send rx b/c he already has plenty of refills.

## 2011-03-17 NOTE — Telephone Encounter (Signed)
Called and spoke with pt. Pt states at OV with CY on 6/8 he rec pt tapering of Nortriptyline to see if this would help vision and mood.  Pt states he is off Nortriptyline and since being off this med, no change to his mood or vision- pt states he was put on restasis which has helped his vision.  Pt now wishes to start back on Nortriptyline.  CY, please advise.  Thanks. No Known Allergies

## 2011-03-17 NOTE — Telephone Encounter (Signed)
Per CY-okay to resume at previous dose with prn refills.

## 2011-04-01 ENCOUNTER — Ambulatory Visit (INDEPENDENT_AMBULATORY_CARE_PROVIDER_SITE_OTHER): Payer: Medicare Other | Admitting: *Deleted

## 2011-04-01 DIAGNOSIS — I4891 Unspecified atrial fibrillation: Secondary | ICD-10-CM

## 2011-04-18 ENCOUNTER — Telehealth: Payer: Self-pay | Admitting: Internal Medicine

## 2011-04-18 NOTE — Telephone Encounter (Signed)
Spoke with pt.  He is switching from [facility] to [facility] and requesting a 90 day rx to be sent to [facility] for klonopin 2 mg qhs prn.  Rx was last called into [facility] on 12/16/10 # 100 x 3.  His last OV with CDY 12/10/10.  Dr. Maple Hudson, are you ok with this?  Pls advise.  Thanks!

## 2011-04-19 MED ORDER — CLONAZEPAM 2 MG PO TABS: 2.0000 mg | ORAL_TABLET | Freq: Every evening | ORAL | Status: DC | PRN

## 2011-04-19 NOTE — Telephone Encounter (Signed)
Per CY-okay to rewrite his klonopin to [facility] [facility].

## 2011-04-19 NOTE — Telephone Encounter (Signed)
Klonopin rx printed for CDY to sign; then will fax to [facility].  I called [facility], spoke with Janeice Robinson - advised to cancel further refills for klonopin rx as pt will be getting this through mail order.  She verbalized understanding of this.  Rx placed on CDY's cart for signature.

## 2011-04-20 NOTE — Telephone Encounter (Signed)
[name], pls advise if this was taken care of yesterday for the pt.

## 2011-04-20 NOTE — Telephone Encounter (Signed)
Rx signed and faxed to [facility].

## 2011-04-29 ENCOUNTER — Encounter: Payer: Medicare Other | Admitting: *Deleted

## 2011-05-02 ENCOUNTER — Ambulatory Visit (INDEPENDENT_AMBULATORY_CARE_PROVIDER_SITE_OTHER): Payer: Medicare Other | Admitting: *Deleted

## 2011-05-02 DIAGNOSIS — I4891 Unspecified atrial fibrillation: Secondary | ICD-10-CM

## 2011-05-02 DIAGNOSIS — Z7901 Long term (current) use of anticoagulants: Secondary | ICD-10-CM

## 2011-05-02 LAB — POCT INR: INR: 2.4

## 2011-05-03 ENCOUNTER — Other Ambulatory Visit: Payer: Self-pay | Admitting: Dermatology

## 2011-05-10 ENCOUNTER — Telehealth: Payer: Self-pay | Admitting: Internal Medicine

## 2011-05-10 NOTE — Telephone Encounter (Signed)
Lmomtcb. Not showing where anyone tried to call pt

## 2011-05-11 MED ORDER — CLONAZEPAM 2 MG PO TABS: 2.0000 mg | ORAL_TABLET | Freq: Every evening | ORAL | Status: DC | PRN

## 2011-05-11 NOTE — Telephone Encounter (Signed)
Per CY ok to refill the clonazepam.   Called and spoke with the pt and he is aware that his clonazepam has been called to [facility] per his request.

## 2011-05-11 NOTE — Telephone Encounter (Signed)
PATIENT IS ANGRY AND WANTS TO PICK UP HIS REFILL AT [facility] TOMORROW.  HE SAYS PHARMACY IS ONLY OPEN FROM 10 - 4 DAILY

## 2011-05-11 NOTE — Telephone Encounter (Signed)
Pt states he needs an rx for Clonazepam, 30 day supply w/ 1 refill, called to [facility] until he can get through [facility] in Jan 2013. Pls advise if this is okay. Last Ov 12/10/10 and to f/u in 1 year. Pls advise.

## 2011-05-11 NOTE — Telephone Encounter (Signed)
Pt stated he was returning Katie's call regarding prescription for clonazepam.  Pt stated that [facility] will not feel the Rx until 07/05/2011.  Pt is requesting to have a 60 day supply called into [facility] to cover the difference.  Pt stated he can not sleep w/out this & is completely out.  Pt can be reached today at [phone].  Antionette Fairy

## 2011-05-17 ENCOUNTER — Other Ambulatory Visit: Payer: Self-pay | Admitting: Dermatology

## 2011-05-27 ENCOUNTER — Encounter: Payer: Self-pay | Admitting: Internal Medicine

## 2011-05-27 ENCOUNTER — Ambulatory Visit (INDEPENDENT_AMBULATORY_CARE_PROVIDER_SITE_OTHER): Payer: Medicare Other | Admitting: *Deleted

## 2011-05-27 DIAGNOSIS — I4891 Unspecified atrial fibrillation: Secondary | ICD-10-CM

## 2011-05-27 DIAGNOSIS — I495 Sick sinus syndrome: Secondary | ICD-10-CM

## 2011-05-31 NOTE — Progress Notes (Signed)
Remote pacer check  

## 2011-06-02 ENCOUNTER — Encounter: Payer: Self-pay | Admitting: *Deleted

## 2011-06-13 ENCOUNTER — Ambulatory Visit (HOSPITAL_COMMUNITY)
Admission: RE | Admit: 2011-06-13 | Discharge: 2011-06-13 | Disposition: A | Discharge status: Home / Self Care | Payer: Medicare Other | Source: Ambulatory Visit | Attending: Urology | Admitting: Urology

## 2011-06-13 ENCOUNTER — Other Ambulatory Visit (HOSPITAL_COMMUNITY): Payer: Self-pay | Admitting: Urology

## 2011-06-13 ENCOUNTER — Ambulatory Visit (INDEPENDENT_AMBULATORY_CARE_PROVIDER_SITE_OTHER): Payer: Medicare Other | Admitting: *Deleted

## 2011-06-13 DIAGNOSIS — I4891 Unspecified atrial fibrillation: Secondary | ICD-10-CM

## 2011-06-13 DIAGNOSIS — C659 Malignant neoplasm of unspecified renal pelvis: Secondary | ICD-10-CM

## 2011-06-13 DIAGNOSIS — Z950 Presence of cardiac pacemaker: Secondary | ICD-10-CM | POA: Insufficient documentation

## 2011-06-13 DIAGNOSIS — Z7901 Long term (current) use of anticoagulants: Secondary | ICD-10-CM

## 2011-06-13 LAB — POCT INR: INR: 2.3

## 2011-06-24 ENCOUNTER — Other Ambulatory Visit: Payer: Self-pay | Admitting: Nurse Practitioner

## 2011-07-05 DIAGNOSIS — IMO0001 Reserved for inherently not codable concepts without codable children: Secondary | ICD-10-CM

## 2011-07-05 HISTORY — DX: Reserved for inherently not codable concepts without codable children: IMO0001

## 2011-07-08 ENCOUNTER — Encounter: Payer: Self-pay | Admitting: Nurse Practitioner

## 2011-07-08 ENCOUNTER — Telehealth: Payer: Self-pay | Admitting: Nurse Practitioner

## 2011-07-08 ENCOUNTER — Telehealth: Payer: Self-pay | Admitting: Internal Medicine

## 2011-07-08 NOTE — Telephone Encounter (Signed)
New msg Pt wants refill of amlodipine 90 day supply Please call to [facility] [phone]

## 2011-07-08 NOTE — Telephone Encounter (Signed)
LMTCB

## 2011-07-11 ENCOUNTER — Ambulatory Visit (INDEPENDENT_AMBULATORY_CARE_PROVIDER_SITE_OTHER): Payer: Medicare Other | Admitting: Nurse Practitioner

## 2011-07-11 ENCOUNTER — Ambulatory Visit: Payer: Medicare Other | Admitting: Nurse Practitioner

## 2011-07-11 ENCOUNTER — Encounter: Payer: Self-pay | Admitting: Nurse Practitioner

## 2011-07-11 VITALS — BP 118/72 | HR 66 | Ht 70.0 in | Wt 178.0 lb

## 2011-07-11 DIAGNOSIS — I4891 Unspecified atrial fibrillation: Secondary | ICD-10-CM

## 2011-07-11 MED ORDER — AMLODIPINE BESYLATE 2.5 MG PO TABS: 2.5000 mg | ORAL_TABLET | Freq: Every day | ORAL | Status: DC

## 2011-07-11 MED ORDER — FINASTERIDE 5 MG PO TABS: 5.0000 mg | ORAL_TABLET | Freq: Every day | ORAL | Status: DC

## 2011-07-11 MED ORDER — SIMVASTATIN 10 MG PO TABS: 10.0000 mg | ORAL_TABLET | Freq: Every day | ORAL | Status: DC

## 2011-07-11 MED ORDER — ATENOLOL 25 MG PO TABS: 25.0000 mg | ORAL_TABLET | Freq: Every day | ORAL | Status: DC

## 2011-07-11 MED ORDER — WARFARIN SODIUM 5 MG PO TABS: ORAL_TABLET | ORAL | Status: DC

## 2011-07-11 NOTE — Patient Instructions (Addendum)
Continue with your pacemaker follow up with Dr. Johney Frame.  We will see you back in March as scheduled with Dr. Johney Frame.  Keep your appointment with Dr. Valentina Lucks for your physical. Make sure you get your cholesterol levels checked.   Keep a check on your blood pressure and let us know if you have any dizzy or lightheaded spells.  Call the [facility] office at [phone] if you have any questions, problems or concerns.

## 2011-07-11 NOTE — Telephone Encounter (Signed)
lmomtcb  

## 2011-07-11 NOTE — Assessment & Plan Note (Signed)
He has chronic atrial fib. Has his pacemaker in place. It is apparently nearing end of life. He remains on his coumadin. He is doing well overall. No cardiac issues. Medicines are refilled today for his mail order. They will continue to check his blood pressure at home. He is to continue with his exercise program. We will see him back as scheduled in March with Dr. Johney Frame. Patient is agreeable to this plan and will call if any problems develop in the interim.

## 2011-07-11 NOTE — Progress Notes (Signed)
Deirdre Peer Wanless Date of Birth: 02-24-1930 Medical Record #[id number]  History of Present Illness: Ferne Reus is seen today for a follow up visit. He is seen for Dr. Johney Frame. He is a former patient of Dr. Ronnald Nian. He has a pacemaker in place, has chronic atrial fib and is on coumadin. Other issues include BPH and prior nephrectomy.  He comes in today. He needs medicines refilled. No real cardiac complaint. Not dizzy or lightheaded. No chest pain. Has had some memory issues. Continues to go to the gym almost every day. Exercises well and has no problems. Just had his follow up for his renal cancer and got a good report.   [facility] Prescriptions on File Prior to Visit  Medication Sig Dispense Refill  . amLODipine (NORVASC) 2.5 MG tablet Take 2.5 mg by mouth daily.       Marland Kitchen aspirin 81 MG tablet Take 81 mg by mouth daily.        Marland Kitchen atenolol (TENORMIN) 25 MG tablet Take 1 tablet (25 mg total) by mouth daily.  90 tablet  3  . clonazePAM (KLONOPIN) 2 MG tablet Take 1 tablet (2 mg total) by mouth at bedtime as needed.  30 tablet  1  . cycloSPORINE (RESTASIS) 0.05 % ophthalmic emulsion Place 1 drop into both eyes 2 (two) times daily.        Marland Kitchen doxazosin (CARDURA) 8 MG tablet Take 8 mg by mouth at bedtime.       . finasteride (PROSCAR) 5 MG tablet Take 5 mg by mouth daily.       . furosemide (LASIX) 40 MG tablet Take 40 mg by mouth daily.       . hydroxypropyl methylcellulose (ISOPTO TEARS) 2.5 % ophthalmic solution Place 1 drop into both eyes at bedtime as needed.        Marland Kitchen ipratropium (ATROVENT) 0.06 % nasal spray Place 2 sprays into the nose 3 (three) times daily as needed.        . Multiple Vitamin (MULTIVITAMIN) capsule Take 1 capsule by mouth daily.        Marland Kitchen omeprazole (PRILOSEC) 20 MG capsule Take 20 mg by mouth 2 (two) times daily.        . simvastatin (ZOCOR) 10 MG tablet Take 10 mg by mouth at bedtime.        . sodium fluoride (FLURA-DROPS) 0.55 (0.25 F) MG/0.6ML solution Take 0.55 mg by  mouth daily.        Marland Kitchen warfarin (COUMADIN) 5 MG tablet Take as directed by the Anticoagulation clinic  90 tablet  0    No Known Allergies  Past Medical History  Diagnosis Date  . Allergic rhinitis   . RLS (restless legs syndrome)   . Atrial fibrillation   . Cardiomyopathy   . Esophageal reflux   . Bronchitis   . Rhinitis   . OSA (obstructive sleep apnea)   . Normal nuclear stress test     Past Surgical History  Procedure Date  . Pacemaker insertion 1988  . Nephrectomy 2010    rt  . Hernia repair     multiple  . Repair ankle ligament   . US echocardiography 04/22/2008    EF 55-60%  . Doppler echocardiography 03/26/2001    EF 55%  . Cardiovascular stress test 03/23/2009    EF 65%, NORMAL    History  Smoking status  . Never Smoker   Smokeless tobacco  . Not on file    History  Alcohol Use  No    Family History  Problem Relation Age of Onset  . Stroke Mother   . Stroke Father     Review of Systems: The review of systems is per the HPI.  No problems reported with his coumadin. Does have OA and BPH symptoms. Weight is down about 4 pounds. INR flow sheet is reviewed. He has has satisfactory INR's for several months. All other systems were reviewed and are negative.  Physical Exam: Ht 5\' 10"  (1.778 m)  Wt 178 lb (80.74 kg)  BMI 25.54 kg/m2 Patient is very pleasant and in no acute distress. Skin is warm and dry. Color is normal.  HEENT is unremarkable. Normocephalic/atraumatic. PERRL. Sclera are nonicteric. Neck is supple. No masses. No JVD. Lungs are clear. Cardiac exam shows a fairly regular rate and rhythm (paced). Abdomen is soft. Extremities are without edema. Gait and ROM are intact. No gross neurologic deficits noted.  LABORATORY DATA:   Assessment / Plan:

## 2011-07-12 NOTE — Telephone Encounter (Signed)
Verified with spouse pt needs script for Clonazepam 2 mg 1 qhs #100 x 3 refills faxed to the # provided with pt's address, phone #, member ID, date of birth

## 2011-07-12 NOTE — Telephone Encounter (Addendum)
Attempting to refill same but having technical difficulties. Have placed ticket and will try again later. Pt aware.

## 2011-07-12 NOTE — Telephone Encounter (Signed)
LMTCB

## 2011-07-13 MED ORDER — CLONAZEPAM 2 MG PO TABS: 2.0000 mg | ORAL_TABLET | Freq: Every day | ORAL | Status: DC

## 2011-07-13 NOTE — Telephone Encounter (Signed)
Rx printed and placed on CDY's cart to be signed, then we will fax to the number provided.

## 2011-07-13 NOTE — Telephone Encounter (Signed)
I left message on voicemail that RX has been faxed as requested.

## 2011-07-21 ENCOUNTER — Other Ambulatory Visit: Payer: Self-pay | Admitting: Internal Medicine

## 2011-07-21 MED ORDER — AMLODIPINE BESYLATE 2.5 MG PO TABS: 2.5000 mg | ORAL_TABLET | Freq: Every day | ORAL | Status: DC

## 2011-07-21 NOTE — Telephone Encounter (Signed)
Fu Call: Pt calling wanting to CANCEL refill request of amlodipine. Please return pt call to discuss further if necessary.

## 2011-07-21 NOTE — Telephone Encounter (Signed)
Refill  Patient would like refill for AMLODIPINE BESYLATE 2.5 MG PO TABS Send to   AMLODIPINE BESYLATE 2.5 MG PO TABS [address] [place], [place]  [phone]

## 2011-07-25 ENCOUNTER — Ambulatory Visit (INDEPENDENT_AMBULATORY_CARE_PROVIDER_SITE_OTHER): Payer: Medicare Other | Admitting: *Deleted

## 2011-07-25 DIAGNOSIS — I4891 Unspecified atrial fibrillation: Secondary | ICD-10-CM

## 2011-07-25 DIAGNOSIS — Z7901 Long term (current) use of anticoagulants: Secondary | ICD-10-CM

## 2011-08-26 ENCOUNTER — Other Ambulatory Visit: Payer: Self-pay | Admitting: Dermatology

## 2011-09-05 ENCOUNTER — Encounter: Payer: Self-pay | Admitting: Internal Medicine

## 2011-09-05 ENCOUNTER — Ambulatory Visit (INDEPENDENT_AMBULATORY_CARE_PROVIDER_SITE_OTHER): Payer: Medicare Other | Admitting: Internal Medicine

## 2011-09-05 ENCOUNTER — Ambulatory Visit (INDEPENDENT_AMBULATORY_CARE_PROVIDER_SITE_OTHER): Payer: Medicare Other | Admitting: *Deleted

## 2011-09-05 DIAGNOSIS — I4891 Unspecified atrial fibrillation: Secondary | ICD-10-CM

## 2011-09-05 DIAGNOSIS — Z7901 Long term (current) use of anticoagulants: Secondary | ICD-10-CM

## 2011-09-05 DIAGNOSIS — I442 Atrioventricular block, complete: Secondary | ICD-10-CM

## 2011-09-05 LAB — PACEMAKER DEVICE OBSERVATION
BRDY-0002RV: 60 {beats}/min
BRDY-0004RV: 120 {beats}/min

## 2011-09-05 LAB — POCT INR: INR: 3.5

## 2011-09-05 NOTE — Patient Instructions (Addendum)
Your physician wants you to follow-up in: 6 months with Dr Jacquiline Doe will receive a reminder letter in the mail two months in advance. If you don't receive a letter, please call our office to schedule the follow-up appointment.  Remote monitoring is used to monitor your Pacemaker of ICD from home. This monitoring reduces the number of office visits required to check your device to one time per year. It allows [place] to keep an eye on the functioning of your device to ensure it is working properly. You are scheduled for a device check from home on 10/06/2011 You may send your transmission at any time that day. If you have a wireless device, the transmission will be sent automatically. After your physician reviews your transmission, you will receive a postcard with your next transmission date.  Your physician has requested that you have an echocardiogram. Echocardiography is a painless test that uses sound waves to create images of your heart. It provides your doctor with information about the size and shape of your heart and how well your heart's chambers and valves are working. This procedure takes approximately one hour. There are no restrictions for this procedure.

## 2011-09-05 NOTE — Progress Notes (Signed)
PCP:  Lillia Mountain, MD, MD  The patient presents today for routine electrophysiology followup.  Since last being seen in our clinic, the patient reports doing very well. He remains active despite his age.  Today, he denies symptoms of palpitations, chest pain, shortness of breath, lower extremity edema, dizziness, presyncope, syncope, or neurologic sequela.  The patient feels that he is tolerating medications without difficulties and is otherwise without complaint today.   Past Medical History  Diagnosis Date  . Allergic rhinitis   . RLS (restless legs syndrome)   . Atrial fibrillation     permanent afib on chronic coumadin  . Esophageal reflux   . Bronchitis   . OSA (obstructive sleep apnea)   . Normal nuclear stress test 2010  . Transitional cell carcinoma     s/p nephrectomy in  2010  . Complete heart block     pacemaker originally placed in 1988  . BPH (benign prostatic hyperplasia)    Past Surgical History  Procedure Date  . Pacemaker insertion 1988    most recent generator change by Dr Deborah Chalk 2006  . Nephrectomy 2010    rt  . Hernia repair     multiple  . Repair ankle ligament   . US echocardiography 04/22/2008    EF 55-60%  . Doppler echocardiography 03/26/2001    EF 55%  . Cardiovascular stress test 03/23/2009    EF 65%, NORMAL    [facility] Prescriptions  Medication Sig Dispense Refill  . amLODipine (NORVASC) 2.5 MG tablet Take 1 tablet (2.5 mg total) by mouth daily.  90 tablet  3  . aspirin 81 MG tablet Take 81 mg by mouth daily.        Marland Kitchen atenolol (TENORMIN) 25 MG tablet Take 1 tablet (25 mg total) by mouth daily.  90 tablet  3  . clindamycin (CLEOCIN T) 1 % lotion       . clonazePAM (KLONOPIN) 2 MG tablet Take 2 mg by mouth at bedtime.        . cycloSPORINE (RESTASIS) 0.05 % ophthalmic emulsion Place 1 drop into both eyes 2 (two) times daily.        Marland Kitchen doxazosin (CARDURA) 8 MG tablet Take 8 mg by mouth at bedtime.       . finasteride (PROSCAR) 5  MG tablet Take 1 tablet (5 mg total) by mouth daily.  90 tablet  3  . furosemide (LASIX) 40 MG tablet Take 40 mg by mouth daily.       Marland Kitchen galantamine (RAZADYNE) 8 MG tablet Take 8 mg by mouth 2 (two) times daily.        . hydroxypropyl methylcellulose (ISOPTO TEARS) 2.5 % ophthalmic solution Place 1 drop into both eyes at bedtime as needed.        Marland Kitchen ipratropium (ATROVENT) 0.06 % nasal spray Place 2 sprays into the nose 3 (three) times daily as needed.        . Multiple Vitamin (MULTIVITAMIN) capsule Take 1 capsule by mouth daily.        . nortriptyline (PAMELOR) 10 MG capsule Take 10 mg by mouth at bedtime.        Marland Kitchen omeprazole (PRILOSEC) 20 MG capsule Take 20 mg by mouth daily.       . simvastatin (ZOCOR) 10 MG tablet Take 1 tablet (10 mg total) by mouth at bedtime.  90 tablet  3  . sodium fluoride (FLURA-DROPS) 0.55 (0.25 F) MG/0.6ML solution Take 0.55 mg by mouth daily.        Marland Kitchen  warfarin (COUMADIN) 5 MG tablet Take as directed by the Anticoagulation clinic  90 tablet  3  . DISCONTD: clonazePAM (KLONOPIN) 2 MG tablet Take 1 tablet (2 mg total) by mouth at bedtime.  100 tablet  3    No Known Allergies  History   Social History  . Marital Status: Married    Spouse Name: N/A    Number of Children: N/A  . Years of Education: N/A   Occupational History  . Retired Network engineer    Social History Main Topics  . Smoking status: Never Smoker   . Smokeless tobacco: Not on file  . Alcohol Use: No  . Drug Use: No  . Sexually Active: Not on file   Other Topics Concern  . Not on file   Social History Narrative  . No narrative on file    Family History  Problem Relation Age of Onset  . Stroke Mother   . Stroke Father     Physical Exam: Filed Vitals:   09/05/11 1104  BP: 112/64  Resp: 18  Height: 5\' 8"  (1.727 m)  Weight: 177 lb (80.287 kg)    GEN- The patient is well appearing, alert and oriented x 3 today.   Head- normocephalic, atraumatic Eyes-  Sclera clear,  conjunctiva pink Ears- hearing intact Oropharynx- clear Neck- supple, no JVP Lymph- no cervical lymphadenopathy Lungs- Clear to ausculation bilaterally, normal work of breathing Chest- R sided pacemaker pocket is well healed Heart- Regular rate and rhythm (paced) GI- soft, NT, ND, + BS Extremities- no clubbing, cyanosis, or edema  Pacemaker interrogation- reviewed in detail today,  See PACEART report  Assessment and Plan:

## 2011-09-05 NOTE — Assessment & Plan Note (Signed)
Permanent afib Goal INR 2-3  Check echo

## 2011-09-05 NOTE — Assessment & Plan Note (Signed)
Not dependant today  Normal pacemaker function 7 months to ERI See Pace Art report No changes today  Monthly TTMs Return in 6 months

## 2011-09-06 ENCOUNTER — Ambulatory Visit: Payer: Medicare Other | Attending: Internal Medicine | Admitting: Physical Therapy

## 2011-09-06 DIAGNOSIS — IMO0001 Reserved for inherently not codable concepts without codable children: Secondary | ICD-10-CM | POA: Insufficient documentation

## 2011-09-06 DIAGNOSIS — M2560 Stiffness of unspecified joint, not elsewhere classified: Secondary | ICD-10-CM | POA: Insufficient documentation

## 2011-09-06 DIAGNOSIS — M542 Cervicalgia: Secondary | ICD-10-CM | POA: Insufficient documentation

## 2011-09-13 ENCOUNTER — Ambulatory Visit: Payer: Medicare Other | Admitting: Physical Therapy

## 2011-09-15 ENCOUNTER — Ambulatory Visit (HOSPITAL_COMMUNITY): Payer: Medicare Other | Attending: Cardiology

## 2011-09-15 ENCOUNTER — Ambulatory Visit: Payer: Medicare Other | Admitting: Physical Therapy

## 2011-09-15 ENCOUNTER — Other Ambulatory Visit: Payer: Self-pay

## 2011-09-15 DIAGNOSIS — I4891 Unspecified atrial fibrillation: Secondary | ICD-10-CM

## 2011-09-15 DIAGNOSIS — I059 Rheumatic mitral valve disease, unspecified: Secondary | ICD-10-CM | POA: Insufficient documentation

## 2011-09-15 DIAGNOSIS — I319 Disease of pericardium, unspecified: Secondary | ICD-10-CM | POA: Insufficient documentation

## 2011-09-15 DIAGNOSIS — I079 Rheumatic tricuspid valve disease, unspecified: Secondary | ICD-10-CM | POA: Insufficient documentation

## 2011-09-15 DIAGNOSIS — Z950 Presence of cardiac pacemaker: Secondary | ICD-10-CM | POA: Insufficient documentation

## 2011-09-15 DIAGNOSIS — I379 Nonrheumatic pulmonary valve disorder, unspecified: Secondary | ICD-10-CM | POA: Insufficient documentation

## 2011-09-15 DIAGNOSIS — R011 Cardiac murmur, unspecified: Secondary | ICD-10-CM | POA: Insufficient documentation

## 2011-09-16 ENCOUNTER — Telehealth: Payer: Self-pay | Admitting: Internal Medicine

## 2011-09-16 MED ORDER — IPRATROPIUM BROMIDE 0.06 % NA SOLN: 2.0000 | Freq: Three times a day (TID) | NASAL | Status: DC | PRN

## 2011-09-16 NOTE — Telephone Encounter (Signed)
I spoke with pt and he stated he needed his ipratropium nasal spray sent to [facility]. I advised pt will send rx and nothing further was needed

## 2011-09-19 ENCOUNTER — Encounter: Payer: Medicare Other | Admitting: Physical Therapy

## 2011-09-19 ENCOUNTER — Ambulatory Visit: Payer: Medicare Other | Admitting: Physical Therapy

## 2011-09-20 ENCOUNTER — Encounter: Payer: Medicare Other | Admitting: Physical Therapy

## 2011-09-26 ENCOUNTER — Ambulatory Visit (INDEPENDENT_AMBULATORY_CARE_PROVIDER_SITE_OTHER): Payer: Medicare Other

## 2011-09-26 ENCOUNTER — Telehealth: Payer: Self-pay | Admitting: Internal Medicine

## 2011-09-26 DIAGNOSIS — I4891 Unspecified atrial fibrillation: Secondary | ICD-10-CM

## 2011-09-26 DIAGNOSIS — Z7901 Long term (current) use of anticoagulants: Secondary | ICD-10-CM

## 2011-09-26 NOTE — Telephone Encounter (Signed)
New Msg: Pt wife returning call to [facility] and wanted to tell Tresa Endo:   "Yes we received results of ECHO and are happy with the results and pt will be back in 6 months as scheduled."   Please return pt wife call to discuss further if necessary.

## 2011-09-26 NOTE — Telephone Encounter (Signed)
Patient had an echo on 09/15/11 and would like for you to call him with the results.

## 2011-09-26 NOTE — Telephone Encounter (Signed)
lmom for pt with results.  I had previously left a message on 09/21/11  Echo looks ok per Dr Johney Frame.  No changes at this time

## 2011-09-28 ENCOUNTER — Ambulatory Visit: Payer: Medicare Other | Admitting: Physical Therapy

## 2011-09-29 ENCOUNTER — Encounter: Payer: Self-pay | Admitting: Internal Medicine

## 2011-09-29 ENCOUNTER — Ambulatory Visit: Payer: Medicare Other | Admitting: Physical Therapy

## 2011-10-06 ENCOUNTER — Encounter: Payer: Medicare Other | Admitting: *Deleted

## 2011-10-13 ENCOUNTER — Encounter: Payer: Self-pay | Admitting: *Deleted

## 2011-10-19 ENCOUNTER — Telehealth: Payer: Self-pay | Admitting: Internal Medicine

## 2011-10-19 MED ORDER — WARFARIN SODIUM 5 MG PO TABS: ORAL_TABLET | ORAL | Status: DC

## 2011-10-19 NOTE — Telephone Encounter (Signed)
Pt needs warfarin 5mg  refilled to [facility]

## 2011-10-24 ENCOUNTER — Encounter: Payer: Medicare Other | Admitting: *Deleted

## 2011-10-28 ENCOUNTER — Encounter: Payer: Self-pay | Admitting: Nurse Practitioner

## 2011-10-28 ENCOUNTER — Telehealth: Payer: Self-pay | Admitting: *Deleted

## 2011-10-28 ENCOUNTER — Ambulatory Visit (INDEPENDENT_AMBULATORY_CARE_PROVIDER_SITE_OTHER): Payer: Medicare Other | Admitting: Nurse Practitioner

## 2011-10-28 ENCOUNTER — Ambulatory Visit (INDEPENDENT_AMBULATORY_CARE_PROVIDER_SITE_OTHER)
Admission: RE | Admit: 2011-10-28 | Discharge: 2011-10-28 | Disposition: A | Discharge status: Home / Self Care | Payer: Medicare Other | Source: Ambulatory Visit | Attending: Nurse Practitioner | Admitting: Nurse Practitioner

## 2011-10-28 ENCOUNTER — Telehealth: Payer: Self-pay | Admitting: Internal Medicine

## 2011-10-28 VITALS — BP 110/80 | HR 70 | Resp 18 | Ht 70.0 in | Wt 177.8 lb

## 2011-10-28 DIAGNOSIS — I4891 Unspecified atrial fibrillation: Secondary | ICD-10-CM

## 2011-10-28 DIAGNOSIS — I442 Atrioventricular block, complete: Secondary | ICD-10-CM

## 2011-10-28 DIAGNOSIS — R0609 Other forms of dyspnea: Secondary | ICD-10-CM

## 2011-10-28 DIAGNOSIS — R911 Solitary pulmonary nodule: Secondary | ICD-10-CM

## 2011-10-28 DIAGNOSIS — R0600 Dyspnea, unspecified: Secondary | ICD-10-CM

## 2011-10-28 DIAGNOSIS — R0989 Other specified symptoms and signs involving the circulatory and respiratory systems: Secondary | ICD-10-CM

## 2011-10-28 LAB — BASIC METABOLIC PANEL
BUN: 27 mg/dL — ABNORMAL HIGH (ref 6–23)
CO2: 28 mEq/L (ref 19–32)
Calcium: 9.5 mg/dL (ref 8.4–10.5)
Chloride: 106 mEq/L (ref 96–112)
Creatinine, Ser: 1.7 mg/dL — ABNORMAL HIGH (ref 0.4–1.5)
GFR: 40.34 mL/min — ABNORMAL LOW (ref >60.00)
Glucose, Bld: 100 mg/dL — ABNORMAL HIGH (ref 70–99)
Potassium: 4.6 mEq/L (ref 3.5–5.1)
Sodium: 141 mEq/L (ref 135–145)

## 2011-10-28 LAB — CBC WITH DIFFERENTIAL/PLATELET
Basophils Absolute: 0 10*3/uL (ref 0.0–0.1)
Basophils Relative: 0.5 % (ref 0.0–3.0)
Eosinophils Absolute: 0 10*3/uL (ref 0.0–0.7)
Eosinophils Relative: 0.1 % (ref 0.0–5.0)
HCT: 38.8 % — ABNORMAL LOW (ref 39.0–52.0)
Hemoglobin: 13 g/dL (ref 13.0–17.0)
Lymphocytes Relative: 17 % (ref 12.0–46.0)
Lymphs Abs: 0.6 10*3/uL — ABNORMAL LOW (ref 0.7–4.0)
MCHC: 33.6 g/dL (ref 30.0–36.0)
MCV: 98.2 fl (ref 78.0–100.0)
Monocytes Absolute: 0.3 10*3/uL (ref 0.1–1.0)
Monocytes Relative: 7.2 % (ref 3.0–12.0)
Neutro Abs: 2.8 10*3/uL (ref 1.4–7.7)
Neutrophils Relative %: 75.2 % (ref 43.0–77.0)
Platelets: 154 10*3/uL (ref 150.0–400.0)
RBC: 3.95 Mil/uL — ABNORMAL LOW (ref 4.22–5.81)
RDW: 14.2 % (ref 11.5–14.6)
WBC: 3.7 10*3/uL — ABNORMAL LOW (ref 4.5–10.5)

## 2011-10-28 LAB — BRAIN NATRIURETIC PEPTIDE: Pro B Natriuretic peptide (BNP): 117 pg/mL — ABNORMAL HIGH (ref 0.0–100.0)

## 2011-10-28 NOTE — Telephone Encounter (Signed)
Spoke with patient and his wife he has been having SOB since Sat and not been able to do his "usual" is silver sneakers. He also c/o of increase in his BP of whish he checks daily.  He has been ranging from 168/75-134/67 HR's in the 70's.  This he states is all high for him.  Dr Johney Frame discussed with Sunday Spillers and she says she will be glad to see him today at 11:30  [facility] aware he may need his device interrogated today

## 2011-10-28 NOTE — Patient Instructions (Addendum)
Transmit your pacemaker on May 30th  We are going to get some labs today  We are going to arrange for a CT scan of your chest  If your scan is ok, we will plan on a stress test and a breathing test  Call the [facility] office at [phone] if you have any questions, problems or concerns.

## 2011-10-28 NOTE — Telephone Encounter (Signed)
Message copied by Burnell Blanks on Fri Oct 28, 2011  4:53 PM ------      Message from: Rosalio Macadamia      Created: Fri Oct 28, 2011  4:38 PM       Ok to report. Will need repeat scan in 6 months.  Increase the Lasix to 60 mg. Check PFT's. Arrange for [facility]. Check BMET in one week. Will need follow up visit after tests complete.

## 2011-10-28 NOTE — Assessment & Plan Note (Signed)
[name] presents with rather abrupt onset of dyspnea. His pacemaker is not at [facility]. His coumadin levels have been therapeutic with his atrial fib.  He is subsequently seen with Dr. Johney Frame. Will go ahead and check a noncontrast CT in light of the abnormal CXR back in December and his history of renal cell carcinoma. We will check BMET/BNP/CBC today as well. If his CT is ok, will arrange for PFT's and probable stress test. For now, no change in his [facility] medicines. Further disposition to follow. Patient is agreeable to this plan and will call if any problems develop in the interim.

## 2011-10-28 NOTE — Telephone Encounter (Signed)
Spoke

## 2011-10-28 NOTE — Progress Notes (Signed)
Deirdre Peer Besecker Date of Birth: 1930/05/29 Medical Record #[id number]  History of Present Illness: Mr. Foxworthy is seen today as a work in visit. He is seen for Dr. Johney Frame. He called earlier this morning to complain of shortness of breath. He has chronic atrial fib, on coumadin, has underlying pacemaker in place that is nearing end of life. Other issues include HTN, chronic lower extremity edema and some mild dementia. He has had renal cell carcinoma and is s/p nephrectomy. Has resultant CRI with one kidney in place.  He comes in today. He is here with his wife. He notes that he has gotten short of breath with just minimial activity. [place] Now can't exercise with Silver Sneakers due to the dyspnea. His swelling is no worse and controlled with support stockings. No cough. No fever. No weight loss. He did have a CXR back in December for his renal follow up that showed a small lung nodule. It was elected to just recheck this in 6 months. He has not had CT scanning of the chest.  [facility] Prescriptions on File Prior to Visit  Medication Sig Dispense Refill  . amLODipine (NORVASC) 2.5 MG tablet Take 1 tablet (2.5 mg total) by mouth daily.  90 tablet  3  . aspirin 81 MG tablet Take 81 mg by mouth daily.        Marland Kitchen atenolol (TENORMIN) 25 MG tablet Take 1 tablet (25 mg total) by mouth daily.  90 tablet  3  . clindamycin (CLEOCIN T) 1 % lotion       . clonazePAM (KLONOPIN) 2 MG tablet Take 2 mg by mouth at bedtime.        . cycloSPORINE (RESTASIS) 0.05 % ophthalmic emulsion Place 1 drop into both eyes 2 (two) times daily.        Marland Kitchen doxazosin (CARDURA) 8 MG tablet Take 8 mg by mouth at bedtime.       . finasteride (PROSCAR) 5 MG tablet Take 1 tablet (5 mg total) by mouth daily.  90 tablet  3  . furosemide (LASIX) 40 MG tablet Take 40 mg by mouth daily.       Marland Kitchen galantamine (RAZADYNE) 8 MG tablet Take 8 mg by mouth 2 (two) times daily.         . hydroxypropyl methylcellulose (ISOPTO TEARS) 2.5 % ophthalmic solution Place 1 drop into both eyes at bedtime as needed.        Marland Kitchen ipratropium (ATROVENT) 0.06 % nasal spray Place 2 sprays into the nose 3 (three) times daily as needed.  45 mL  0  . Multiple Vitamin (MULTIVITAMIN) capsule Take 1 capsule by mouth daily.        . nortriptyline (PAMELOR) 10 MG capsule Take 10 mg by mouth at bedtime.        Marland Kitchen omeprazole (PRILOSEC) 20 MG capsule Take 20 mg by mouth daily.       . simvastatin (ZOCOR) 10 MG tablet Take 1 tablet (10 mg total) by mouth at bedtime.  90 tablet  3  . warfarin (COUMADIN) 5 MG tablet Take as directed by the Anticoagulation clinic  90 tablet  1  . sodium fluoride (FLURA-DROPS) 0.55 (0.25 F) MG/0.6ML solution Take 0.55 mg by mouth daily.          No Known Allergies  Past Medical History  Diagnosis Date  . Allergic rhinitis   . RLS (restless legs syndrome)   .  Atrial fibrillation     permanent afib on chronic coumadin  . Esophageal reflux   . Bronchitis   . OSA (obstructive sleep apnea)   . Normal nuclear stress test 2010  . Transitional cell carcinoma     s/p nephrectomy in  2010  . Complete heart block     pacemaker originally placed in 1988  . BPH (benign prostatic hyperplasia)   . Lung nodule     noted on CXR December 2012  . Renal insufficiency     Past Surgical History  Procedure Date  . Pacemaker insertion 1988    most recent generator change by Dr Deborah Chalk 2006  . Nephrectomy 2010    rt  . Hernia repair     multiple  . Repair ankle ligament   . US echocardiography 04/22/2008    EF 55-60%  . Doppler echocardiography 03/26/2001    EF 55%  . Cardiovascular stress test 03/23/2009    EF 65%, NORMAL    History  Smoking status  . Never Smoker   Smokeless tobacco  . Not on file    History  Alcohol Use No    Family History  Problem Relation Age of Onset  . Stroke Mother   . Stroke Father     Review of Systems: The review of systems  is positive for dyspnea.  All other systems were reviewed and are negative.  Physical Exam: BP 110/80  Pulse 70  Resp 18  Ht 5\' 10"  (1.778 m)  Wt 177 lb 12.8 oz (80.65 kg)  BMI 25.51 kg/m2 His oxygen sat is 98% here in the office.  Patient is very pleasant. He does however get short of breath with even talking. Skin is warm and dry. Color is normal.  HEENT is unremarkable. Normocephalic/atraumatic. PERRL. Sclera are nonicteric. Neck is supple. No masses. No JVD. Lungs are clear. Cardiac exam shows an irregular rhythm. Rate is controlled. Does have an outflow murmur. Abdomen is soft. Extremities are without edema. He has his support stockings on. Gait and ROM are intact. No gross neurologic deficits noted.  LABORATORY DATA:  LAST CXR 06/2011 IMPRESSION: Enlargement of cardiac silhouette post pacemaker. Emphysematous changes with questionable 4 mm diameter nodular density versus artifact at left upper lobe. Recommend either follow-up CT chest to exclude pulmonary nodule.  These results will be called to the ordering clinician or representative by the Radiologist Assistant, and communication documented in the PACS Dashboard.  Echo Study Conclusions From March 2013  - Left ventricle: The cavity size was normal. Wall thickness was increased in a pattern of mild LVH. Systolic function was normal. The estimated ejection fraction was in the range of 50% to 55%. Wall motion was normal; there were no regional wall motion abnormalities. - Aortic valve: Valve mobility was restricted. Trivial regurgitation. - Mitral valve: Mild regurgitation. - Left atrium: The atrium was severely dilated. - Right atrium: The atrium was severely dilated. - Pericardium, extracardiac: A small pericardial effusion was identified.   EKG today shows V pacing with underlying atrial fib. Tracing has been reviewed with Dr. Johney Frame. His pacemaker was checked as well. He is not at [facility]   Assessment / Plan:

## 2011-10-28 NOTE — Assessment & Plan Note (Signed)
This is chron and controlled with rate control and coumadin.

## 2011-10-28 NOTE — Assessment & Plan Note (Signed)
Pacemaker was checked today. Not at [facility].

## 2011-10-28 NOTE — Telephone Encounter (Signed)
Advised patient and scheduled labs 

## 2011-10-28 NOTE — Telephone Encounter (Signed)
Pt's wife calling re BP, HEART RATE, have been elevated since mondays, SOB while excercising

## 2011-11-01 ENCOUNTER — Telehealth: Payer: Self-pay | Admitting: Internal Medicine

## 2011-11-01 ENCOUNTER — Telehealth: Payer: Self-pay | Admitting: *Deleted

## 2011-11-01 DIAGNOSIS — R0600 Dyspnea, unspecified: Secondary | ICD-10-CM

## 2011-11-01 NOTE — Telephone Encounter (Signed)
Left message for Dr Roxy Cedar nurse to call and discuss.

## 2011-11-01 NOTE — Telephone Encounter (Signed)
Message copied by Burnell Blanks on Tue Nov 01, 2011  1:05 PM ------      Message from: Rosalio Macadamia      Created: Sat Oct 29, 2011 12:09 PM       Ok to report. Labs are satisfactory. Ok to cut the Lasix back to his usual dose. Will see what the stress test and PFT's show. May need to see pulmonary.

## 2011-11-01 NOTE — Telephone Encounter (Signed)
I attempted to call cardiology regarding patient-unable to reach nurse-will need to try again tomorrow.

## 2011-11-02 ENCOUNTER — Telehealth: Payer: Self-pay | Admitting: Internal Medicine

## 2011-11-02 NOTE — Telephone Encounter (Signed)
Patient returning call.

## 2011-11-02 NOTE — Telephone Encounter (Signed)
Pt's wife calling re waiting for appt date for Dr Maple Hudson for PFT date, that we are  to schedule, wife Cam

## 2011-11-02 NOTE — Telephone Encounter (Signed)
We can go ahead and order PFT under my name, so he can get that done as needed by cardiology, ahead of my next appointment.

## 2011-11-02 NOTE — Telephone Encounter (Signed)
Patient pulmonologist is Dr Young.  Spoke with Crystal at Dr Young's office and he reviewed the information on patient.  Per Crystal Dr Young will order the PFT's and have scheduled patient for Friday at 2:00 at  Pulmonary .  Advised wife and went over the instruction sheet Crystal faxed.  

## 2011-11-02 NOTE — Telephone Encounter (Signed)
Patient pulmonologist is Dr Maple Hudson.  Spoke with Crystal at Dr Roxy Cedar office and he reviewed the information on patient.  Per Crystal Dr Maple Hudson will order the PFT's and have scheduled patient for Friday at 2:00 at [facility] .  Advised wife and went over the instruction sheet Crystal faxed.

## 2011-11-02 NOTE — Telephone Encounter (Signed)
Pt seen by cardiology on 10-28-11 for increased SOB.  Chest Ct was ordered and is now scheduled for stress test on 11-07-11.  They are also wanting to order PFT's but realized that pt sees Dr Maple Hudson for pulmonary .  They would like CY's advice on whether to order PFT's now or have pt come in and see CY for f/u.  Pt's next appt with CY is not until 12-12-11.  Please advise.

## 2011-11-02 NOTE — Telephone Encounter (Signed)
Called, spoke with [place].  She is aware of below per Dr. Maple Hudson.  I have placed order for PFT and scheduled him for Friday, May 3 at 2 pm at our office.  I faxed the PFT Pt Instruction form to [place].  She will call pt's wife to inform of PFT appt date/time/location and of the PFT pt instructions.

## 2011-11-02 NOTE — Telephone Encounter (Signed)
Will call patients wife back and discuss, waiting for Dr Roxy Cedar office to get back in touch

## 2011-11-04 ENCOUNTER — Ambulatory Visit (INDEPENDENT_AMBULATORY_CARE_PROVIDER_SITE_OTHER): Payer: Medicare Other | Admitting: Internal Medicine

## 2011-11-04 ENCOUNTER — Other Ambulatory Visit: Payer: Medicare Other

## 2011-11-04 DIAGNOSIS — R0600 Dyspnea, unspecified: Secondary | ICD-10-CM

## 2011-11-04 DIAGNOSIS — R0609 Other forms of dyspnea: Secondary | ICD-10-CM

## 2011-11-04 LAB — PULMONARY FUNCTION TEST

## 2011-11-04 NOTE — Progress Notes (Signed)
PFT done today. 

## 2011-11-07 ENCOUNTER — Other Ambulatory Visit (INDEPENDENT_AMBULATORY_CARE_PROVIDER_SITE_OTHER): Payer: Medicare Other

## 2011-11-07 ENCOUNTER — Ambulatory Visit (HOSPITAL_COMMUNITY): Payer: Medicare Other | Attending: Internal Medicine | Admitting: Radiology

## 2011-11-07 ENCOUNTER — Ambulatory Visit (INDEPENDENT_AMBULATORY_CARE_PROVIDER_SITE_OTHER): Payer: Medicare Other | Admitting: Pharmacist

## 2011-11-07 VITALS — BP 146/80 | Ht 70.0 in | Wt 174.0 lb

## 2011-11-07 DIAGNOSIS — I4891 Unspecified atrial fibrillation: Secondary | ICD-10-CM

## 2011-11-07 DIAGNOSIS — R0602 Shortness of breath: Secondary | ICD-10-CM | POA: Insufficient documentation

## 2011-11-07 DIAGNOSIS — R0609 Other forms of dyspnea: Secondary | ICD-10-CM | POA: Insufficient documentation

## 2011-11-07 DIAGNOSIS — R0989 Other specified symptoms and signs involving the circulatory and respiratory systems: Secondary | ICD-10-CM | POA: Insufficient documentation

## 2011-11-07 DIAGNOSIS — J4489 Other specified chronic obstructive pulmonary disease: Secondary | ICD-10-CM | POA: Insufficient documentation

## 2011-11-07 DIAGNOSIS — I10 Essential (primary) hypertension: Secondary | ICD-10-CM | POA: Insufficient documentation

## 2011-11-07 DIAGNOSIS — J449 Chronic obstructive pulmonary disease, unspecified: Secondary | ICD-10-CM | POA: Insufficient documentation

## 2011-11-07 DIAGNOSIS — R0600 Dyspnea, unspecified: Secondary | ICD-10-CM

## 2011-11-07 DIAGNOSIS — Z7901 Long term (current) use of anticoagulants: Secondary | ICD-10-CM

## 2011-11-07 DIAGNOSIS — Z950 Presence of cardiac pacemaker: Secondary | ICD-10-CM | POA: Insufficient documentation

## 2011-11-07 LAB — BASIC METABOLIC PANEL
BUN: 24 mg/dL — ABNORMAL HIGH (ref 6–23)
CO2: 25 mEq/L (ref 19–32)
Calcium: 9.4 mg/dL (ref 8.4–10.5)
Chloride: 107 mEq/L (ref 96–112)
Creatinine, Ser: 1.5 mg/dL (ref 0.4–1.5)
GFR: 49.78 mL/min — ABNORMAL LOW (ref >60.00)
Glucose, Bld: 89 mg/dL (ref 70–99)
Potassium: 4.9 mEq/L (ref 3.5–5.1)
Sodium: 141 mEq/L (ref 135–145)

## 2011-11-07 LAB — POCT INR: INR: 3

## 2011-11-07 MED ORDER — TECHNETIUM TC 99M TETROFOSMIN IV KIT
10.8000 | PACK | Freq: Once | INTRAVENOUS | Status: AC | PRN
  Administered 2011-11-07: 11 via INTRAVENOUS

## 2011-11-07 MED ORDER — ADENOSINE (DIAGNOSTIC) 3 MG/ML IV SOLN
0.5600 mg/kg | Freq: Once | INTRAVENOUS | Status: AC
  Administered 2011-11-07: 44.1 mg via INTRAVENOUS

## 2011-11-07 MED ORDER — TECHNETIUM TC 99M TETROFOSMIN IV KIT
32.9000 | PACK | Freq: Once | INTRAVENOUS | Status: AC | PRN
  Administered 2011-11-07: 32.9 via INTRAVENOUS

## 2011-11-07 NOTE — Progress Notes (Signed)
[facility] SITE 3 NUCLEAR MED [address] [place] [address] [phone]  Cardiology Nuclear Med [name] is a 78 y.o. male     MRN : [id number]     DOB: [birth year]  Procedure Date: 11/07/2011  Nuclear Med Background Indication for Stress Test:  Evaluation for Ischemia History:  COPD, '88, Pacemaker: CHB: Generator change out '06, '10 MPS: EF: 65% NL, 3/13 ECHO: EF: mild LVH EF: 50-55% mild MR Cardiac Risk Factors: Hypertension  Symptoms:  DOE and SOB   Nuclear Pre-Procedure Caffeine/Decaff Intake:  None NPO After: 8:00am   Lungs:  clear O2 Sat: 99% on room air. IV 0.9% NS with Angio Cath:  22g  IV Site: R Forearm  IV Started by:  Stanton Kidney, EMT-P  Chest Size (in):  40 Cup Size: n/a  Height: 5\' 10"  (1.778 m)  Weight:  174 lb (78.926 kg)  BMI:  Body mass index is 24.97 kg/(m^2). Tech Comments:  Atenolol held > 24 hours, per patient.    Nuclear Med Study 1 or 2 day study: 1 day  Stress Test Type:  Eugenie Birks  Reading MD: Dietrich Pates, MD  Order Authorizing Provider:  J.AllredMD L. Gerhardt NP  Resting Radionuclide: Technetium 13m Tetrofosmin  Resting Radionuclide Dose: 10.8 mCi   Stress Radionuclide:  Technetium 32m Tetrofosmin  Stress Radionuclide Dose: 32.9 mCi           Stress Protocol Rest HR: 60 Stress HR: 61  Rest BP: 146/80 Stress BP: 134/93  Exercise Time (min): n/a METS: n/a   Predicted Max HR: 141 bpm % Max HR: 43.26 bpm Rate Pressure Product: 8906   Dose of Adenosine (mg):  44.3 Dose of Lexiscan: n/a mg  Dose of Atropine (mg): n/a Dose of Dobutamine: n/a mcg/kg/min (at max HR)  Stress Test Technologist: Milana Na, EMT-P  Nuclear Technologist:  Domenic Polite, CNMT     Rest Procedure:  Myocardial perfusion imaging was performed at rest 45 minutes following the intravenous administration of Technetium 65m Tetrofosmin. Rest ECG: V Pacemaker  Stress Procedure:  The patient received IV adenosine at 140 mcg/kg/min  for 4 minutes.  There were no significant changes with infusion.  Technetium 40m Tetrofosmin was injected at the 2 minute mark and quantitative spect images were obtained after a 45 minute delay. Stress ECG: Nondiagnostic due to pacing activity.  QPS Raw Data Images:  Soft tissue (diaphragm) underlies the inferior wall. Stress Images:  Normal homogeneous uptake in all areas of the myocardium. Rest Images:  Thinning with decreased counts in the inferior (distal) inferoseptal (distal) wall. Subtraction (SDS):  Not significant for ischemia. Transient Ischemic Dilatation (Normal <1.22):  1.02 Lung/Heart Ratio (Normal <0.45):  0.39  Quantitative Gated Spect Images QGS EDV:  113 ml QGS ESV:  49 ml  Impression Exercise Capacity:  Lexiscan with no exercise. BP Response:  Normal blood pressure response. Clinical Symptoms:  No chest pain. ECG Impression:  Nondiagnostic. Comparison with Prior Nuclear Study: No change from previous report.  Overall Impression:  Normal stress nuclear study.  LV Ejection Fraction: 56%.  LV Wall Motion:  Normal thickening.  Septal hypokinesis.  Dietrich Pates

## 2011-11-08 ENCOUNTER — Telehealth: Payer: Self-pay | Admitting: Internal Medicine

## 2011-11-08 NOTE — Telephone Encounter (Signed)
Pt is requesting his pft results from 11/04/11. Please advise Dr. Maple Hudson, thanks

## 2011-11-09 ENCOUNTER — Telehealth: Payer: Self-pay | Admitting: Internal Medicine

## 2011-11-09 NOTE — Telephone Encounter (Signed)
Sent report to Dr Lilli Light

## 2011-11-09 NOTE — Telephone Encounter (Signed)
Pt's wife calling re sending results of pt's ct chest done 10-26-11 to dr Lilli Light [phone] [facility]

## 2011-11-10 NOTE — Telephone Encounter (Signed)
Called, spoke with pt.  He had PFTs done on 11/04/11 - he would like results today as he is going out of town tomorrow.  Dr. Maple Hudson, pls advise.  Thank you.

## 2011-11-10 NOTE — Telephone Encounter (Signed)
Pt stated he needs the results today-he is going out of town tomorrow.  [name]

## 2011-11-11 NOTE — Telephone Encounter (Signed)
I spoke with the pt and advised of results. Pt states he is still sob and wants to know how to proceed. I advised needs an appt so appt set for Monday 11-14-11 at 10am.Kemauri Musa [place], CMA

## 2011-11-11 NOTE — Telephone Encounter (Signed)
PFT- normal spirometry flows and oxygen exchange.   Sorry results were delayed getting into computer.

## 2011-11-14 ENCOUNTER — Ambulatory Visit (INDEPENDENT_AMBULATORY_CARE_PROVIDER_SITE_OTHER): Payer: Medicare Other | Admitting: Internal Medicine

## 2011-11-14 ENCOUNTER — Encounter: Payer: Self-pay | Admitting: Internal Medicine

## 2011-11-14 VITALS — BP 118/68 | HR 70 | Ht 70.0 in | Wt 174.0 lb

## 2011-11-14 DIAGNOSIS — G8929 Other chronic pain: Secondary | ICD-10-CM

## 2011-11-14 DIAGNOSIS — J309 Allergic rhinitis, unspecified: Secondary | ICD-10-CM

## 2011-11-14 DIAGNOSIS — R0989 Other specified symptoms and signs involving the circulatory and respiratory systems: Secondary | ICD-10-CM

## 2011-11-14 DIAGNOSIS — G2581 Restless legs syndrome: Secondary | ICD-10-CM

## 2011-11-14 DIAGNOSIS — G4733 Obstructive sleep apnea (adult) (pediatric): Secondary | ICD-10-CM

## 2011-11-14 DIAGNOSIS — R0600 Dyspnea, unspecified: Secondary | ICD-10-CM

## 2011-11-14 DIAGNOSIS — G4701 Insomnia due to medical condition: Secondary | ICD-10-CM

## 2011-11-14 MED ORDER — TRAMADOL HCL 50 MG PO TABS: 50.0000 mg | ORAL_TABLET | Freq: Four times a day (QID) | ORAL | Status: DC | PRN

## 2011-11-14 NOTE — Patient Instructions (Addendum)
Order- 6 minute walk test on room air- done  Script tramadol printed     Try 1 or 2 at bedtime to help sleep. May take with clonazepam, but watch for oversedation

## 2011-11-14 NOTE — Progress Notes (Signed)
Subjective:    Patient ID: [name], male    DOB: 1930-05-16, 78 y.o.   MRN: [id number]  HPI 12/10/10- 78 yoM never smoker followed for OSA, hx Cheynes-Stokes, Restless legs, complicated by CM/ AFib/ brady-tachy/ pacer, allergic rhinitis, bronchitis, GERD. Hx right nephrectomy for cancer. Last here December 31, 2009 - note reviewed  CPAP continues to work fine, 7 cwp.  He likes his new machine.  Nortrpityline was given originally for depression and sleep. We discussed the possibility it was blurring his vision a little. He denies depression now.  He sleeps very well and is unaware of any limb movement disturbance now.   11/14/11- 78 yoM never smoker followed for OSA, hx Cheynes-Stokes, Restless legs, complicated by CM/ AFib/ brady-tachy/ pacer, allergic rhinitis, bronchitis, GERD. Hx right nephrectomy for cancer. Wears CPAP every night; States having Increased SOB as well-dizzy feelings as well; had pacemaker checked recently at cardiology and everything was normal. Describes episodes of feeling weak and dizzy and several situations, not always with exertion. Does think exercise tolerance has declined some. Insomnia with CPAP despite clonazepam 2 mg at bedtime. Sleep latency 2 hours. He thinks the main problem is arthritis and physical discomforts. PFT: 11/04/2011 normal spirometry with high normal lung volumes. FEV1/FVC 0.73. Insignificant response to bronchodilator. DLCO 118%. Room air oximetry with exercise: Ambulated several laps around our office today: 98% at rest with pulse 76. He did not drop below 97% saturation on room air and pulse rose no higher than 75. CT chest 10/28/11- images reviewed with him and his wife: IMPRESSION:  1. The plain film abnormality likely corresponds to a calcified  granuloma within the left upper lobe. There are other tiny  nodules, some of which are calcified. Given the history of  transitional cell carcinoma, follow-up with chest CT at 6 months  should be  considered.  2. No acute process in the chest. Cardiomegaly and small volume  pericardial effusion. The effusion is new since 06/14/2011.  3. Small volume perihepatic ascites. Incompletely imaged but felt  to be similar to 06/14/2011. Question fluid overload.  Original Report Authenticated By: Consuello Bossier, M.D.   ROS-see HPI Constitutional:   No-   weight loss, night sweats, fevers, chills, fatigue, lassitude. HEENT:   No-  headaches, difficulty swallowing, tooth/dental problems, sore throat,       No-  sneezing, itching, ear ache, nasal congestion, post nasal drip,  CV:  No-   chest pain, orthopnea, PND, swelling in lower extremities, anasarca, +dizziness, palpitations Resp: + shortness of breath with exertion or at rest.              No-   productive cough,  No non-productive cough,  No- coughing up of blood.              No-   change in color of mucus.  No- wheezing.   Skin: No-   rash or lesions. GI:  No-   heartburn, indigestion, abdominal pain, nausea, vomiting, GU:  MS:  No-   joint pain or swelling.   Neuro-     nothing unusual Psych:  No- change in mood or affect. No depression or anxiety.  No memory loss.  OBJ- Physical Exam General- Alert, Oriented, Affect-appropriate, Distress- none acute. Trim, fit appearing.  Skin- rash-none, lesions- none, excoriation- none Lymphadenopathy- none Head- atraumatic            Eyes- Gross vision intact, PERRLA, conjunctivae and secretions clear  Ears- Hearing, canals-normal            Nose- Clear, no-Septal dev, mucus, polyps, erosion, perforation             Throat- Mallampati II , mucosa clear , drainage- none, tonsils- atrophic Neck- flexible , trachea midline, no stridor , thyroid nl, carotid no bruit Chest - symmetrical excursion , unlabored           Heart/CV- RRR , no murmur , no gallop  , no rub, nl s1 s2                           - JVD- none , edema- none, stasis changes- none, varices- none           Lung- clear to  P&A, no rales, unlabored, wheeze- none, cough- none , dullness-none, rub- none           Chest wall-  Abd-  Br/ Gen/ Rectal- Not done, not indicated Extrem- cyanosis- none, clubbing, none, atrophy- none, strength- nl Neuro- grossly intact to observation  Assessment & Plan:

## 2011-11-19 NOTE — Assessment & Plan Note (Signed)
Good CPAP compliance and control. He does not need weight loss.

## 2011-11-19 NOTE — Assessment & Plan Note (Signed)
Clonazepam seems to be controlling this complaint.

## 2011-11-19 NOTE — Assessment & Plan Note (Signed)
Episodes of weakness and dyspnea include, but not limited to exertion. His chest CT question some fluid overload and with his known cardiac problems, I favor a cardiac explanation. His pulmonary function tests and good preservation of oxygen saturation on walking exercise today, make a pulmonary explanation on likely. I do not think he is describing bronchospasm. Consider possibility of hypoglycemia since he has dieted off some weight and is quite lean.

## 2011-11-19 NOTE — Assessment & Plan Note (Signed)
He now describes a sustained problem with delayed sleep onset and attributed primarily to arthralgias in his neck. Clonazepam is of limited help. Plan-try tramadol 50 mg at bedtime

## 2011-11-19 NOTE — Assessment & Plan Note (Signed)
He has not recognized significant discomfort but admits pain more attention to his general sense of episodic dyspnea.

## 2011-11-21 ENCOUNTER — Telehealth: Payer: Self-pay | Admitting: Internal Medicine

## 2011-11-21 NOTE — Telephone Encounter (Signed)
I spoke with pt and he states the first 2 nights pt has tried taken tramadol 50 mg once at night and was not able to sleep at all. On the 3rd and 4th night he took 2 tablets and was able to sleep fine. Pt is requesting to have his rx increased to 2 tablets at bedtime. Spouse got on the phone and stated when pt took 2 tablets he was slurring his words and almost fell asleep while driving. She stated he was also feeling sleepy while in church on Sunday after taking the 2 tablets at bedtime. Please advise Dr. Maple Hudson, thanks

## 2011-11-22 MED ORDER — TRAMADOL HCL 50 MG PO TABS: 50.0000 mg | ORAL_TABLET | Freq: Every evening | ORAL | Status: AC | PRN

## 2011-11-22 NOTE — Telephone Encounter (Signed)
lmomtcb x1 for pt rx has been sent 

## 2011-11-22 NOTE — Telephone Encounter (Signed)
I spoke with pt and he states his wife tricked him last night and only gave him 1 tablet of the tramadol and stated he sleep fine. I advised pt when we spoke with him yesterday he stated he couldn't sleep with just taking 1 tablet. He stated he can now and did fine last night and would like an rx for this called in. Pt is requesting a 90 day supply be sent into [facility] for a 90 day supply. Dr. Maple Hudson, please advise thanks

## 2011-11-22 NOTE — Telephone Encounter (Signed)
Per CY-okay to try 1.5 tablet for a few nights and see what he and she think.

## 2011-11-22 NOTE — Telephone Encounter (Signed)
Pt returned call.  Advised pt that his requested rx has been sent to [facility] as requested.  Pt verbalized his understanding.  Will sign off.

## 2011-11-22 NOTE — Telephone Encounter (Signed)
Per CY-okay to to give Tramadol 50 mg #90 take 1 po qhs prn with 3 refills.

## 2011-11-30 ENCOUNTER — Telehealth: Payer: Self-pay | Admitting: Internal Medicine

## 2011-11-30 MED ORDER — NORTRIPTYLINE HCL 10 MG PO CAPS: 10.0000 mg | ORAL_CAPSULE | Freq: Every day | ORAL | Status: DC

## 2011-11-30 NOTE — Telephone Encounter (Signed)
Per CY-okay to refill prn. 

## 2011-11-30 NOTE — Telephone Encounter (Signed)
Spouse states pt needs refill on nortriptyline 10 mg. This was last refilled 08/30/10 #90 x 3 refills Please advise Dr. Maple Hudson thanks

## 2011-11-30 NOTE — Telephone Encounter (Signed)
rx has been sent and pt is aware. Nothing further was needed 

## 2011-12-01 ENCOUNTER — Encounter: Payer: Medicare Other | Admitting: *Deleted

## 2011-12-06 ENCOUNTER — Telehealth: Payer: Self-pay | Admitting: Internal Medicine

## 2011-12-06 ENCOUNTER — Encounter: Payer: Self-pay | Admitting: *Deleted

## 2011-12-06 NOTE — Telephone Encounter (Signed)
Please return call to patient at [phone] regarding remote transmission help.

## 2011-12-06 NOTE — Telephone Encounter (Signed)
Transmission not received, patient will try and resend.

## 2011-12-07 ENCOUNTER — Telehealth: Payer: Self-pay | Admitting: Internal Medicine

## 2011-12-07 NOTE — Telephone Encounter (Signed)
Follow-up:    Patient returned your call.  Please call back. 

## 2011-12-07 NOTE — Telephone Encounter (Signed)
New Problem:     Patient called in to report that after placing his new filter his telemetry machine is not working.  Please call back.

## 2011-12-07 NOTE — Telephone Encounter (Signed)
New msg Pt's wife is having problem with transmission. She said she talked to you yesterday

## 2011-12-07 NOTE — Telephone Encounter (Signed)
Spoke with patient, we will leave a DSL filter at the front desk for him to see if that helps with sending his remote transmissions.

## 2011-12-07 NOTE — Telephone Encounter (Signed)
lmom in regards to transmitter not working. Transmitter and return kit to be ordered for pt. Instructed to call if any questions.

## 2011-12-08 NOTE — Telephone Encounter (Signed)
lmom to return call

## 2011-12-09 ENCOUNTER — Other Ambulatory Visit: Payer: Self-pay | Admitting: *Deleted

## 2011-12-09 MED ORDER — NORTRIPTYLINE HCL 10 MG PO CAPS: 10.0000 mg | ORAL_CAPSULE | Freq: Every day | ORAL | Status: DC

## 2011-12-12 ENCOUNTER — Ambulatory Visit: Payer: Medicare Other | Admitting: Internal Medicine

## 2011-12-12 ENCOUNTER — Telehealth: Payer: Self-pay | Admitting: Internal Medicine

## 2011-12-12 NOTE — Telephone Encounter (Signed)
I spoke with spouse and she states she received a letter from [facility] stating they never received rx for the nortriptyline. The letter was sent to them on 12/08/11. I advised pt we sent in rx 12/09/11. She voiced her understanding and needed nothing further

## 2011-12-13 NOTE — Telephone Encounter (Signed)
Spoke w/wife and aware that new transmitter has been ordered and to send old transmitter back with return kit.

## 2011-12-19 ENCOUNTER — Ambulatory Visit (INDEPENDENT_AMBULATORY_CARE_PROVIDER_SITE_OTHER): Payer: Medicare Other | Admitting: *Deleted

## 2011-12-19 DIAGNOSIS — Z7901 Long term (current) use of anticoagulants: Secondary | ICD-10-CM

## 2011-12-19 DIAGNOSIS — I4891 Unspecified atrial fibrillation: Secondary | ICD-10-CM

## 2011-12-20 ENCOUNTER — Other Ambulatory Visit: Payer: Self-pay | Admitting: Internal Medicine

## 2011-12-20 ENCOUNTER — Telehealth: Payer: Self-pay | Admitting: Internal Medicine

## 2011-12-20 MED ORDER — IPRATROPIUM BROMIDE 0.06 % NA SOLN: 2.0000 | Freq: Three times a day (TID) | NASAL | Status: DC | PRN

## 2011-12-20 NOTE — Telephone Encounter (Signed)
Pt's wife calling re not receiving new [employer]onic transmitter yet, pls call

## 2011-12-20 NOTE — Telephone Encounter (Signed)
Left message on machine.  [employer] box was shipped to old address.  [employer] called and new monitor requested to be shipped to [place] address.

## 2011-12-20 NOTE — Telephone Encounter (Signed)
Last ov with CDY 5.13.13, follow up in 6 months.  Atrovent nasal spray last filled 3.15.13 with no refills.  Called spoke with Sheria Lang Kosanke, verified that pt is needing a refill on the atrovent nasal spray 30day supply to [facility] and a 90day to [facility].  Scripts sent.

## 2011-12-23 ENCOUNTER — Telehealth: Payer: Self-pay | Admitting: Internal Medicine

## 2011-12-23 NOTE — Telephone Encounter (Signed)
New Problem:    Patient called in wanting a call back today because he is still having trouble transmitting through his new transmitter.  Please call back.

## 2011-12-27 ENCOUNTER — Encounter: Payer: Self-pay | Admitting: Internal Medicine

## 2011-12-27 ENCOUNTER — Telehealth: Payer: Self-pay | Admitting: Internal Medicine

## 2011-12-27 NOTE — Telephone Encounter (Signed)
Pt has his new box for remote checks and he needs to know what to do with the old one

## 2011-12-27 NOTE — Telephone Encounter (Signed)
Transmission was received.  

## 2011-12-27 NOTE — Telephone Encounter (Signed)
Pt to bring in transmitter at appt with Nehemiah Settle on 12-29-11 @ 1515. Device reached [facility].

## 2011-12-29 ENCOUNTER — Encounter: Payer: Self-pay | Admitting: Cardiology

## 2011-12-29 ENCOUNTER — Encounter: Payer: Self-pay | Admitting: Internal Medicine

## 2011-12-29 ENCOUNTER — Encounter: Payer: Self-pay | Admitting: *Deleted

## 2011-12-29 ENCOUNTER — Ambulatory Visit (INDEPENDENT_AMBULATORY_CARE_PROVIDER_SITE_OTHER): Payer: Medicare Other | Admitting: Cardiology

## 2011-12-29 VITALS — BP 100/70 | HR 65 | Ht 70.0 in | Wt 175.8 lb

## 2011-12-29 DIAGNOSIS — I4891 Unspecified atrial fibrillation: Secondary | ICD-10-CM

## 2011-12-29 LAB — PACEMAKER DEVICE OBSERVATION
BMOD-0003RV: 30
BRDY-0004RV: 120 {beats}/min

## 2011-12-29 NOTE — Patient Instructions (Signed)
See instruction sheet for pacemaker generator change.  Labs to be done today.

## 2011-12-29 NOTE — Progress Notes (Signed)
ELECTROPHYSIOLOGY OFFICE NOTE  Patient ID: [name] MRN: [id number], DOB/AGE: 1930/01/04   Date of Visit: 12/29/2011  Primary Physician: Thora Lance, MD Primary Electrophysiologist: Hillis Range, MD Reason for Visit: Device follow-up; PPM battery at [facility]  History of Present Illness Mr. Long is a pleasant 78 year old gentleman with complete heart block s/p PPM implantation in 1988 and permanent AF who presents today for device follow-up after his device battery was found to be at [facility]. He reports intermittent fatigue but doesn't feel this is any worse than baseline. He denies chest pain, shortness of breath, palpitations, dizziness, near syncope or syncope. He denies LE swelling, orthopnea or PND.  Past Medical History  Diagnosis Date  . Allergic rhinitis   . RLS (restless legs syndrome)   . Atrial fibrillation     permanent afib on chronic coumadin  . Esophageal reflux   . Bronchitis   . OSA (obstructive sleep apnea)   . Normal nuclear stress test 2010  . Transitional cell carcinoma     s/p nephrectomy in  2010  . Complete heart block     pacemaker originally placed in 1988  . BPH (benign prostatic hyperplasia)   . Lung nodule     noted on CXR December 2012  . Renal insufficiency     Past Surgical History  Procedure Date  . Pacemaker insertion 1988    most recent generator change by Dr Deborah Chalk 2006  . Nephrectomy, right 2010  . Hernia repair   . Repair ankle ligament   . Doppler echocardiography 03/26/2001    EF 55%  . Cardiovascular stress test 03/23/2009    EF 65%, NORMAL    Allergies/Intolerances No Known Allergies  [place] Home Medications Medication Sig Dispense Refill  . amLODipine (NORVASC) 2.5 MG tablet Take 1 tablet (2.5 mg total) by mouth daily.  90 tablet  3  . aspirin 81 MG tablet Take 81 mg by mouth daily.        Marland Kitchen atenolol (TENORMIN) 25 MG tablet Take 1 tablet (25 mg total) by mouth daily.  90 tablet  3  . clonazePAM (KLONOPIN) 2 MG tablet  Take 2 mg by mouth at bedtime.        . cycloSPORINE (RESTASIS) 0.05 % ophthalmic emulsion Place 1 drop into both eyes 2 (two) times daily.        . Dietary Management Product (AXONA) packet Take 40 g by mouth daily.      Marland Kitchen doxazosin (CARDURA) 8 MG tablet Take 8 mg by mouth at bedtime.       . finasteride (PROSCAR) 5 MG tablet Take 1 tablet (5 mg total) by mouth daily.  90 tablet  3  . fish oil-omega-3 fatty acids 1000 MG capsule Take 1,500 mg by mouth daily.      . furosemide (LASIX) 40 MG tablet Take 40 mg by mouth daily.       Marland Kitchen galantamine (RAZADYNE) 12 MG tablet Take 12 mg by mouth 2 (two) times daily.      . hydroxypropyl methylcellulose (ISOPTO TEARS) 2.5 % ophthalmic solution Place 1 drop into both eyes at bedtime as needed.        Marland Kitchen ipratropium (ATROVENT) 0.06 % nasal spray Place 2 sprays into the nose daily.      . memantine (NAMENDA) 10 MG tablet Take 10 mg by mouth 2 (two) times daily.      . Multiple Vitamin (MULTIVITAMIN) capsule Take 1 capsule by mouth daily.        Marland Kitchen  nortriptyline (PAMELOR) 10 MG capsule Take 1 capsule (10 mg total) by mouth at bedtime.  90 capsule  3  . omeprazole (PRILOSEC) 20 MG capsule Take 20 mg by mouth daily.       . simvastatin (ZOCOR) 10 MG tablet Take 1 tablet (10 mg total) by mouth at bedtime.  90 tablet  3  . warfarin (COUMADIN) 5 MG tablet Take as directed by the Anticoagulation clinic  90 tablet  1   Social History Social History  . Marital Status: Married   Occupational History  . Retired Network engineer    Social History Main Topics  . Smoking status: Never Smoker   . Smokeless tobacco: No  . Alcohol Use: No  . Drug Use: No   Review of Systems General:  No chills, fever, night sweats or weight changes Cardiovascular:  No chest pain, dyspnea on exertion, edema, orthopnea, palpitations, paroxysmal nocturnal dyspnea Dermatological: No rash, lesions or masses Respiratory: No cough, dyspnea Urologic: No hematuria, dysuria Abdominal:    No nausea, vomiting, diarrhea, bright red blood per rectum, melena, or hematemesis Neurologic:  No visual changes, weakness, changes in mental status All other systems reviewed and are otherwise negative except as noted above.  Physical Exam Blood pressure 100/70, pulse 65, height 5\' 10"  (1.778 m), weight 175 lb 12.8 oz (79.742 kg).  General: Well developed, well appearing 78 year old male in no acute distress. HEENT: Normocephalic, atraumatic. EOMs intact. Sclera nonicteric. Oropharynx clear.  Neck: Supple without bruits. No JVD. Lungs: Respirations regular and unlabored, CTA bilaterally. No wheezes, rales or rhonchi. Heart: Regular. S1, S2 present. No murmurs, rub, S3 or S4. Abdomen: Soft, non-distended.  Extremities: No clubbing, cyanosis or edema. DP/PT/Radials 2+ and equal bilaterally. Psych: Normal affect. Neuro: Alert and oriented X 3. Moves all extremities spontaneously.   Diagnostics 12-lead ECG today shows ventricular pacing at 65 bpm with underlying AF Device interrogation today shows device battery at [facility], normal PPM function with stable lead parameters/measurements; see PaceArt report  Assessment and Plan 1. Complete heart block - s/p PPM; normal device function; battery at [facility]; discussed the need for generator change; he is pacer dependent; reviewed the procedure including risks and benefits; these risks include, but are not limited to, bleeding and infection; Mr. Bartram expressed verbal understanding and wishes to proceed  This plan of care was formulated with Dr. Hillis Range who was in to see the patient with me. Limmie Patricia, PA-C 12/29/2011, 4:41 PM

## 2011-12-30 ENCOUNTER — Encounter (HOSPITAL_COMMUNITY): Payer: Self-pay | Admitting: Pharmacy Technician

## 2011-12-30 LAB — BASIC METABOLIC PANEL
BUN: 29 mg/dL — ABNORMAL HIGH (ref 6–23)
Chloride: 106 mEq/L (ref 96–112)
Creatinine, Ser: 1.7 mg/dL — ABNORMAL HIGH (ref 0.4–1.5)
Glucose, Bld: 84 mg/dL (ref 70–99)

## 2011-12-30 LAB — CBC WITH DIFFERENTIAL/PLATELET
Basophils Relative: 1 % (ref 0.0–3.0)
Eosinophils Absolute: 0 10*3/uL (ref 0.0–0.7)
Hemoglobin: 13.2 g/dL (ref 13.0–17.0)
Lymphocytes Relative: 21.6 % (ref 12.0–46.0)
MCHC: 32.8 g/dL (ref 30.0–36.0)
MCV: 98.7 fl (ref 78.0–100.0)
Neutro Abs: 3 10*3/uL (ref 1.4–7.7)
RBC: 4.06 Mil/uL — ABNORMAL LOW (ref 4.22–5.81)

## 2011-12-30 LAB — PROTIME-INR: Prothrombin Time: 21.9 s — ABNORMAL HIGH (ref 10.2–12.4)

## 2012-01-02 ENCOUNTER — Other Ambulatory Visit: Payer: Self-pay | Admitting: *Deleted

## 2012-01-02 DIAGNOSIS — I442 Atrioventricular block, complete: Secondary | ICD-10-CM

## 2012-01-02 MED ORDER — SODIUM CHLORIDE 0.9 % IV SOLN
250.0000 mL | INTRAVENOUS | Status: DC
  Administered 2012-01-03: 14:00:00 via INTRAVENOUS

## 2012-01-02 MED ORDER — SODIUM CHLORIDE 0.45 % IV SOLN
INTRAVENOUS | Status: DC
  Administered 2012-01-03: 14:00:00 via INTRAVENOUS

## 2012-01-02 MED ORDER — SODIUM CHLORIDE 0.9 % IJ SOLN: 3.0000 mL | INTRAMUSCULAR | Status: DC | PRN

## 2012-01-02 MED ORDER — CHLORHEXIDINE GLUCONATE 4 % EX LIQD
60.0000 mL | Freq: Once | CUTANEOUS | Status: DC
  Filled 2012-01-02: qty 60

## 2012-01-02 MED ORDER — CEFAZOLIN SODIUM-DEXTROSE 2-3 GM-% IV SOLR
2.0000 g | INTRAVENOUS | Status: DC
  Filled 2012-01-02: qty 50

## 2012-01-02 MED ORDER — SODIUM CHLORIDE 0.9 % IJ SOLN: 3.0000 mL | Freq: Two times a day (BID) | INTRAMUSCULAR | Status: DC

## 2012-01-02 MED ORDER — SODIUM CHLORIDE 0.9 % IR SOLN
80.0000 mg | Status: DC
  Filled 2012-01-02: qty 2

## 2012-01-03 ENCOUNTER — Encounter (HOSPITAL_COMMUNITY)
Admission: RE | Disposition: A | Discharge status: Home / Self Care | Payer: Self-pay | Source: Ambulatory Visit | Attending: Internal Medicine

## 2012-01-03 ENCOUNTER — Encounter (HOSPITAL_COMMUNITY): Payer: Self-pay | Admitting: *Deleted

## 2012-01-03 ENCOUNTER — Ambulatory Visit (HOSPITAL_COMMUNITY)
Admission: RE | Admit: 2012-01-03 | Discharge: 2012-01-04 | Disposition: A | Discharge status: Home / Self Care | Payer: Medicare Other | Source: Ambulatory Visit | Attending: Internal Medicine | Admitting: Internal Medicine

## 2012-01-03 DIAGNOSIS — Z950 Presence of cardiac pacemaker: Secondary | ICD-10-CM

## 2012-01-03 DIAGNOSIS — I4891 Unspecified atrial fibrillation: Secondary | ICD-10-CM | POA: Diagnosis present

## 2012-01-03 DIAGNOSIS — I442 Atrioventricular block, complete: Secondary | ICD-10-CM

## 2012-01-03 DIAGNOSIS — Y831 Surgical operation with implant of artificial internal device as the cause of abnormal reaction of the patient, or of later complication, without mention of misadventure at the time of the procedure: Secondary | ICD-10-CM | POA: Insufficient documentation

## 2012-01-03 DIAGNOSIS — K219 Gastro-esophageal reflux disease without esophagitis: Secondary | ICD-10-CM | POA: Insufficient documentation

## 2012-01-03 DIAGNOSIS — G4733 Obstructive sleep apnea (adult) (pediatric): Secondary | ICD-10-CM | POA: Insufficient documentation

## 2012-01-03 DIAGNOSIS — N400 Enlarged prostate without lower urinary tract symptoms: Secondary | ICD-10-CM | POA: Insufficient documentation

## 2012-01-03 DIAGNOSIS — Z45018 Encounter for adjustment and management of other part of cardiac pacemaker: Secondary | ICD-10-CM | POA: Insufficient documentation

## 2012-01-03 DIAGNOSIS — R911 Solitary pulmonary nodule: Secondary | ICD-10-CM | POA: Insufficient documentation

## 2012-01-03 DIAGNOSIS — G2581 Restless legs syndrome: Secondary | ICD-10-CM | POA: Insufficient documentation

## 2012-01-03 DIAGNOSIS — T82190A Other mechanical complication of cardiac electrode, initial encounter: Secondary | ICD-10-CM | POA: Insufficient documentation

## 2012-01-03 DIAGNOSIS — Z7901 Long term (current) use of anticoagulants: Secondary | ICD-10-CM | POA: Insufficient documentation

## 2012-01-03 HISTORY — PX: PERMANENT PACEMAKER GENERATOR CHANGE: SHX6022

## 2012-01-03 HISTORY — DX: Pneumonia, unspecified organism: J18.9

## 2012-01-03 HISTORY — DX: Unspecified dementia, unspecified severity, without behavioral disturbance, psychotic disturbance, mood disturbance, and anxiety: F03.90

## 2012-01-03 HISTORY — DX: Unspecified osteoarthritis, unspecified site: M19.90

## 2012-01-03 HISTORY — DX: Essential (primary) hypertension: I10

## 2012-01-03 LAB — PROTIME-INR
INR: 1.83 — ABNORMAL HIGH (ref 0.00–1.49)
Prothrombin Time: 21.5 seconds — ABNORMAL HIGH (ref 11.6–15.2)

## 2012-01-03 LAB — SURGICAL PCR SCREEN
MRSA, PCR: NEGATIVE
Staphylococcus aureus: NEGATIVE

## 2012-01-03 SURGERY — PERMANENT PACEMAKER GENERATOR CHANGE

## 2012-01-03 MED ORDER — HYPROMELLOSE (GONIOSCOPIC) 2.5 % OP SOLN
1.0000 [drp] | Freq: Every day | OPHTHALMIC | Status: DC
  Administered 2012-01-03: 1 [drp] via OPHTHALMIC
  Filled 2012-01-03: qty 15

## 2012-01-03 MED ORDER — LIDOCAINE HCL (PF) 1 % IJ SOLN
INTRAMUSCULAR | Status: AC
  Filled 2012-01-03: qty 60

## 2012-01-03 MED ORDER — MEMANTINE HCL 10 MG PO TABS
10.0000 mg | ORAL_TABLET | Freq: Two times a day (BID) | ORAL | Status: DC
  Administered 2012-01-03 – 2012-01-04 (×2): 10 mg via ORAL
  Filled 2012-01-03 (×3): qty 1

## 2012-01-03 MED ORDER — ONDANSETRON HCL 4 MG/2ML IJ SOLN: 4.0000 mg | Freq: Four times a day (QID) | INTRAMUSCULAR | Status: DC | PRN

## 2012-01-03 MED ORDER — PANTOPRAZOLE SODIUM 40 MG PO TBEC
40.0000 mg | DELAYED_RELEASE_TABLET | Freq: Every day | ORAL | Status: DC
  Administered 2012-01-04: 40 mg via ORAL
  Filled 2012-01-03: qty 1

## 2012-01-03 MED ORDER — CEFAZOLIN SODIUM 1-5 GM-% IV SOLN
1.0000 g | Freq: Four times a day (QID) | INTRAVENOUS | Status: AC
  Administered 2012-01-03 – 2012-01-04 (×3): 1 g via INTRAVENOUS
  Filled 2012-01-03 (×3): qty 50

## 2012-01-03 MED ORDER — SODIUM CHLORIDE 0.9 % IJ SOLN
3.0000 mL | Freq: Two times a day (BID) | INTRAMUSCULAR | Status: DC
  Administered 2012-01-03 – 2012-01-04 (×2): 3 mL via INTRAVENOUS

## 2012-01-03 MED ORDER — MIDAZOLAM HCL 5 MG/5ML IJ SOLN
INTRAMUSCULAR | Status: AC
  Filled 2012-01-03: qty 5

## 2012-01-03 MED ORDER — CLONAZEPAM 1 MG PO TABS
2.0000 mg | ORAL_TABLET | Freq: Every day | ORAL | Status: DC
  Administered 2012-01-03: 2 mg via ORAL
  Filled 2012-01-03: qty 2

## 2012-01-03 MED ORDER — SODIUM CHLORIDE 0.9 % IV SOLN: 250.0000 mL | INTRAVENOUS | Status: DC | PRN

## 2012-01-03 MED ORDER — SODIUM CHLORIDE 0.9 % IJ SOLN: 3.0000 mL | INTRAMUSCULAR | Status: DC | PRN

## 2012-01-03 MED ORDER — ATENOLOL 25 MG PO TABS
25.0000 mg | ORAL_TABLET | Freq: Every day | ORAL | Status: DC
  Administered 2012-01-04: 25 mg via ORAL
  Filled 2012-01-03: qty 1

## 2012-01-03 MED ORDER — DOXAZOSIN MESYLATE 8 MG PO TABS
8.0000 mg | ORAL_TABLET | Freq: Every day | ORAL | Status: DC
  Administered 2012-01-03: 8 mg via ORAL
  Filled 2012-01-03 (×2): qty 1

## 2012-01-03 MED ORDER — IPRATROPIUM BROMIDE 0.06 % NA SOLN
2.0000 | Freq: Every day | NASAL | Status: DC
  Filled 2012-01-03: qty 15

## 2012-01-03 MED ORDER — CYCLOSPORINE 0.05 % OP EMUL
1.0000 [drp] | Freq: Two times a day (BID) | OPHTHALMIC | Status: DC
  Administered 2012-01-03 – 2012-01-04 (×3): 1 [drp] via OPHTHALMIC
  Filled 2012-01-03 (×4): qty 1

## 2012-01-03 MED ORDER — NORTRIPTYLINE HCL 10 MG PO CAPS
10.0000 mg | ORAL_CAPSULE | Freq: Every day | ORAL | Status: DC
  Administered 2012-01-03: 10 mg via ORAL
  Filled 2012-01-03 (×2): qty 1

## 2012-01-03 MED ORDER — HYDROCODONE-ACETAMINOPHEN 5-325 MG PO TABS
1.0000 | ORAL_TABLET | ORAL | Status: DC | PRN
  Administered 2012-01-03: 2 via ORAL
  Administered 2012-01-04: 1 via ORAL
  Administered 2012-01-04 (×2): 2 via ORAL
  Filled 2012-01-03 (×4): qty 2
  Filled 2012-01-03: qty 1

## 2012-01-03 MED ORDER — OMEGA-3-ACID ETHYL ESTERS 1 G PO CAPS
1.0000 g | ORAL_CAPSULE | Freq: Every day | ORAL | Status: DC
  Administered 2012-01-04: 1 g via ORAL
  Filled 2012-01-03: qty 1

## 2012-01-03 MED ORDER — POLYVINYL ALCOHOL 1.4 % OP SOLN: 1.0000 [drp] | Freq: Every day | OPHTHALMIC | Status: DC

## 2012-01-03 MED ORDER — SIMVASTATIN 10 MG PO TABS
10.0000 mg | ORAL_TABLET | Freq: Every day | ORAL | Status: DC
  Administered 2012-01-03: 10 mg via ORAL
  Filled 2012-01-03 (×2): qty 1

## 2012-01-03 MED ORDER — AMLODIPINE BESYLATE 2.5 MG PO TABS
2.5000 mg | ORAL_TABLET | Freq: Every day | ORAL | Status: DC
  Administered 2012-01-03: 2.5 mg via ORAL
  Filled 2012-01-03 (×2): qty 1

## 2012-01-03 MED ORDER — GALANTAMINE HYDROBROMIDE 4 MG PO TABS
12.0000 mg | ORAL_TABLET | Freq: Two times a day (BID) | ORAL | Status: DC
  Filled 2012-01-03 (×2): qty 3

## 2012-01-03 MED ORDER — CEFAZOLIN SODIUM-DEXTROSE 2-3 GM-% IV SOLR
INTRAVENOUS | Status: AC
  Filled 2012-01-03: qty 50

## 2012-01-03 MED ORDER — ACETAMINOPHEN 325 MG PO TABS: 325.0000 mg | ORAL_TABLET | ORAL | Status: DC | PRN

## 2012-01-03 MED ORDER — FINASTERIDE 5 MG PO TABS
5.0000 mg | ORAL_TABLET | Freq: Every day | ORAL | Status: DC
  Administered 2012-01-03: 5 mg via ORAL
  Filled 2012-01-03 (×2): qty 1

## 2012-01-03 MED ORDER — MUPIROCIN 2 % EX OINT
TOPICAL_OINTMENT | Freq: Two times a day (BID) | CUTANEOUS | Status: DC
  Administered 2012-01-03: 14:00:00 via NASAL
  Filled 2012-01-03 (×2): qty 22

## 2012-01-03 NOTE — H&P (View-Only) (Signed)
 ELECTROPHYSIOLOGY OFFICE NOTE  Patient ID: [name] MRN: [id number], DOB/AGE: 08/13/1929   Date of Visit: 12/29/2011  Primary Physician: John J. Griffin, MD Primary Electrophysiologist: James Allred, MD Reason for Visit: Device follow-up; PPM battery at [facility]  History of Present Illness [name] is a pleasant 78 year old gentleman with complete heart block s/p PPM implantation in 1988 and permanent AF who presents today for device follow-up after his device battery was found to be at [facility]. He reports intermittent fatigue but doesn't feel this is any worse than baseline. He denies chest pain, shortness of breath, palpitations, dizziness, near syncope or syncope. He denies LE swelling, orthopnea or PND.  Past Medical History  Diagnosis Date  . Allergic rhinitis   . RLS (restless legs syndrome)   . Atrial fibrillation     permanent afib on chronic coumadin  . Esophageal reflux   . Bronchitis   . OSA (obstructive sleep apnea)   . Normal nuclear stress test 2010  . Transitional cell carcinoma     s/p nephrectomy in  2010  . Complete heart block     pacemaker originally placed in 1988  . BPH (benign prostatic hyperplasia)   . Lung nodule     noted on CXR December 2012  . Renal insufficiency     Past Surgical History  Procedure Date  . Pacemaker insertion 1988    most recent generator change by Dr Tennant 2006  . Nephrectomy, right 2010  . Hernia repair   . Repair ankle ligament   . Doppler echocardiography 03/26/2001    EF 55%  . Cardiovascular stress test 03/23/2009    EF 65%, NORMAL    Allergies/Intolerances No Known Allergies  [place] Home Medications Medication Sig Dispense Refill  . amLODipine (NORVASC) 2.5 MG tablet Take 1 tablet (2.5 mg total) by mouth daily.  90 tablet  3  . aspirin 81 MG tablet Take 81 mg by mouth daily.        . atenolol (TENORMIN) 25 MG tablet Take 1 tablet (25 mg total) by mouth daily.  90 tablet  3  . clonazePAM (KLONOPIN) 2 MG tablet  Take 2 mg by mouth at bedtime.        . cycloSPORINE (RESTASIS) 0.05 % ophthalmic emulsion Place 1 drop into both eyes 2 (two) times daily.        . Dietary Management Product (AXONA) packet Take 40 g by mouth daily.      . doxazosin (CARDURA) 8 MG tablet Take 8 mg by mouth at bedtime.       . finasteride (PROSCAR) 5 MG tablet Take 1 tablet (5 mg total) by mouth daily.  90 tablet  3  . fish oil-omega-3 fatty acids 1000 MG capsule Take 1,500 mg by mouth daily.      . furosemide (LASIX) 40 MG tablet Take 40 mg by mouth daily.       . galantamine (RAZADYNE) 12 MG tablet Take 12 mg by mouth 2 (two) times daily.      . hydroxypropyl methylcellulose (ISOPTO TEARS) 2.5 % ophthalmic solution Place 1 drop into both eyes at bedtime as needed.        . ipratropium (ATROVENT) 0.06 % nasal spray Place 2 sprays into the nose daily.      . memantine (NAMENDA) 10 MG tablet Take 10 mg by mouth 2 (two) times daily.      . Multiple Vitamin (MULTIVITAMIN) capsule Take 1 capsule by mouth daily.        .   nortriptyline (PAMELOR) 10 MG capsule Take 1 capsule (10 mg total) by mouth at bedtime.  90 capsule  3  . omeprazole (PRILOSEC) 20 MG capsule Take 20 mg by mouth daily.       . simvastatin (ZOCOR) 10 MG tablet Take 1 tablet (10 mg total) by mouth at bedtime.  90 tablet  3  . warfarin (COUMADIN) 5 MG tablet Take as directed by the Anticoagulation clinic  90 tablet  1   Social History Social History  . Marital Status: Married   Occupational History  . Retired textile salesman    Social History Main Topics  . Smoking status: Never Smoker   . Smokeless tobacco: No  . Alcohol Use: No  . Drug Use: No   Review of Systems General:  No chills, fever, night sweats or weight changes Cardiovascular:  No chest pain, dyspnea on exertion, edema, orthopnea, palpitations, paroxysmal nocturnal dyspnea Dermatological: No rash, lesions or masses Respiratory: No cough, dyspnea Urologic: No hematuria, dysuria Abdominal:    No nausea, vomiting, diarrhea, bright red blood per rectum, melena, or hematemesis Neurologic:  No visual changes, weakness, changes in mental status All other systems reviewed and are otherwise negative except as noted above.  Physical Exam Blood pressure 100/70, pulse 65, height 5' 10" (1.778 m), weight 175 lb 12.8 oz (79.742 kg).  General: Well developed, well appearing 78 year old male in no acute distress. HEENT: Normocephalic, atraumatic. EOMs intact. Sclera nonicteric. Oropharynx clear.  Neck: Supple without bruits. No JVD. Lungs: Respirations regular and unlabored, CTA bilaterally. No wheezes, rales or rhonchi. Heart: Regular. S1, S2 present. No murmurs, rub, S3 or S4. Abdomen: Soft, non-distended.  Extremities: No clubbing, cyanosis or edema. DP/PT/Radials 2+ and equal bilaterally. Psych: Normal affect. Neuro: Alert and oriented X 3. Moves all extremities spontaneously.   Diagnostics 12-lead ECG today shows ventricular pacing at 65 bpm with underlying AF Device interrogation today shows device battery at [facility], normal PPM function with stable lead parameters/measurements; see PaceArt report  Assessment and Plan 1. Complete heart block - s/p PPM; normal device function; battery at [facility]; discussed the need for generator change; he is pacer dependent; reviewed the procedure including risks and benefits; these risks include, but are not limited to, bleeding and infection; [name] expressed verbal understanding and wishes to proceed  This plan of care was formulated with Dr. James Allred who was in to see the patient with me. Signed, Azie Mcconahy, PA-C 12/29/2011, 4:41 PM   

## 2012-01-03 NOTE — Interval H&P Note (Signed)
History and Physical Interval Note:  01/03/2012 2:55 PM  [name]  has presented today for surgery, with the diagnosis of End of life  The various methods of treatment have been discussed with the patient and family. After consideration of risks, benefits and other options for treatment, the patient has consented to  Procedure(s) (LRB): PERMANENT PACEMAKER GENERATOR CHANGE (N/A) as a surgical intervention .  The patient's history has been reviewed, patient examined, no change in status, stable for surgery.  I have reviewed the patients' chart and labs.  Questions were answered to the patient's satisfaction.     Hillis Range

## 2012-01-04 ENCOUNTER — Encounter: Payer: Self-pay | Admitting: *Deleted

## 2012-01-04 ENCOUNTER — Ambulatory Visit (HOSPITAL_COMMUNITY): Payer: Medicare Other

## 2012-01-04 DIAGNOSIS — Z950 Presence of cardiac pacemaker: Secondary | ICD-10-CM | POA: Insufficient documentation

## 2012-01-04 NOTE — Discharge Summary (Addendum)
ELECTROPHYSIOLOGY DISCHARGE SUMMARY    Patient ID: [name],  MRN: [id number], DOB/AGE: 1930/05/17 78 y.o.  Admit date: 01/03/2012 Discharge date: 01/04/2012  Primary Care Physician: Michael Funk, MD Primary Cardiologist: Michael Range, MD  Primary Discharge Diagnosis:  1. Pacemaker battery at [facility] s/p single chamber PPM generator change and RV lead revision  Secondary Discharge Diagnoses:  1. CHB 2. Permanent AF 3. OSA  Procedures This Admission:  1. Single chamber PPM generator change with RV lead revision Medtronic model 5076 - 52 (serial number [id number]) right ventricular lead. Medtronic [place], model [id number] (serial number [id number]) pacemaker.  History and Hospital Course:  [name] is a pleasant 78 year old gentleman with complete heart block s/p PPM implantation in 1988 and permanent AF who presented to the clinic last week for device follow-up after his device battery was found to be at [facility]. He underwent PPM generator change and RV lead revision yesterday with Dr. Johney [name]. He remains hemodynamically stable. His device interrogation shows normal single chamber PPM function with stable lead parameters. His chest x-ray shows stable lead placement without pneumothorax. His implant site is intact without significant bleeding or hematoma. He has been ambulating without difficulty. He has been seen, examined and deemed stable for discharge to home today by Dr. Lewayne [name].  Discharge Vitals: Blood pressure 130/72, pulse 62, temperature 98.5 F (36.9 C), temperature source Oral, resp. rate 12, height 5\' 10"  (1.778 m), weight 170 lb (77.111 kg), SpO2 97.00%.   Labs: Component Value Date   WBC 4.4* 12/29/2011   HGB 13.2 12/29/2011   HCT 40.1 12/29/2011   MCV 98.7 12/29/2011   PLT 152.0 12/29/2011     Lab 12/29/11 1638  NA 140  K 4.6  CL 106  CO2 28  BUN 29*  CREATININE 1.7*  CALCIUM 9.5  PROT --  BILITOT --  ALKPHOS --  ALT --  AST --  GLUCOSE 84    Basename  01/03/12 1340  INR 1.83*    Disposition:  The patient is being discharged in stable condition.  Follow-up: Follow-up Information    Follow up with [name] [facility] on 01/16/2012. (At 4:30 PM for wound check)    Contact information:   [address]  [address] [place] [address] [phone]    Follow up with Michael Range, MD on 05/02/2012. (At 9:00 AM)    Contact information:   [address]  [address] [place] [address] [phone]   Discharge Medications:   TAKE these medications     amLODipine 2.5 MG tablet   Commonly known as: NORVASC   Take 2.5 mg by mouth daily.      aspirin 81 MG tablet   Take 81 mg by mouth daily.      atenolol 25 MG tablet   Commonly known as: TENORMIN   Take 25 mg by mouth daily.      AXONA packet   Take 40 g by mouth daily.      clonazePAM 2 MG tablet   Commonly known as: KLONOPIN   Take 2 mg by mouth at bedtime.      cycloSPORINE 0.05 % ophthalmic emulsion   Commonly known as: RESTASIS   Place 1 drop into both eyes 2 (two) times daily.      doxazosin 8 MG tablet   Commonly known as: CARDURA   Take 8 mg by mouth at bedtime.      finasteride 5 MG tablet  Commonly known as: PROSCAR   Take 5 mg by mouth daily.      FISH OIL PO   Take 1,500 mg by mouth daily.      furosemide 40 MG tablet   Commonly known as: LASIX   Take 40 mg by mouth daily.      galantamine 12 MG tablet   Commonly known as: RAZADYNE   Take 12 mg by mouth 2 (two) times daily.      hydroxypropyl methylcellulose 2.5 % ophthalmic solution   Commonly known as: ISOPTO TEARS   Place 1 drop into both eyes at bedtime.      ipratropium 0.06 % nasal spray   Commonly known as: ATROVENT   Place 2 sprays into the nose daily.      memantine 10 MG tablet   Commonly known as: NAMENDA   Take 10 mg by mouth 2 (two) times daily.      multivitamin capsule   Take 1 capsule by mouth daily.      nortriptyline 10 MG capsule    Commonly known as: PAMELOR   Take 10 mg by mouth at bedtime.      omeprazole 20 MG capsule   Commonly known as: PRILOSEC   Take 20 mg by mouth daily.      simvastatin 10 MG tablet   Commonly known as: ZOCOR   Take 10 mg by mouth at bedtime.      warfarin 5 MG tablet   Commonly known as: COUMADIN   Take 2.5-5 mg by mouth daily. Pt takes 5 mg on mon, wed, fri and 2.5 mg all other days      Duration of Discharge Encounter: Greater than 30 minutes including physician time.  Signed, Rick Duff, PA-C 01/04/2012, 9:37 AM EP Attending  Patient seen and examined. Agree with above assessment and plan. His device is working normally today. Incision without hematoma. Will discharge with usual followup.  Leonia Reeves.D.

## 2012-01-04 NOTE — Op Note (Signed)
NAME:  [name], [name]              ACCOUNT NO.:  [id number]  MEDICAL RECORD NO.:  [id number]  LOCATION:  2925                         FACILITY:  [facility]  PHYSICIAN:  Hillis Range, MD       DATE OF BIRTH:  04-23-1930  DATE OF PROCEDURE: DATE OF DISCHARGE:                              OPERATIVE REPORT   SURGEON:  Hillis Range, MD  PREPROCEDURE DIAGNOSES: 1. Permanent atrial fibrillation. 2. Complete heart block. 3. Pacemaker at elective replacement indicator.  POSTPROCEDURE DIAGNOSES: 1. Permanent atrial fibrillation. 2. Complete heart block. 3. Pacemaker at elective replacement indicator.  PROCEDURES: 1. Left upper extremity venography. 2. Single-chamber pacemaker implantation with skin pocket revision and     removal of a prior pacemaker pulse generator.  INTRODUCTION:  Mr. Raiche is a pleasant 78 year old gentleman with a longstanding history of permanent atrial fibrillation and complete heart block.  He underwent pacemaker implantation initially in 1988.  He has had multiple generators replaced since that time.  He recently has reached elective replacement indicator.  He therefore presents today for pacemaker, pulse generator replacement with possible lead revision giving the age of his ventricular lead.  DESCRIPTION OF THE PROCEDURE:  Informed written consent was obtained and the patient was brought to the Electrophysiology Lab in the fasting state.  He was adequately sedated with intravenous Versed as outlined in the nursing report.  The patient's pacemaker was interrogated and confirmed to be at elective replacement indicator.  The skin overlying his existing pacemaker was infiltrated with lidocaine for local analgesia.  A 5-cm incision was made over the existing pacemaker pocket. The pacemaker was exposed using a combination of sharp and blunt dissection.  Electrocautery was required to assure hemostasis.  The pacemaker was removed from the pocket and confirmed to be  a Medtronic, model [id number] (serial number [id number]) pacemaker implanted on May 18, 2005.  The right ventricular lead was a Medtronic, model 4003 (serial number [id number]) implanted on March 18, 1987.  The lead was visually inspected and noted to be quite brittle.  Lead impedance through the analyzer measured less than 200 ohms and pacing threshold was elevated at 2 volts at 1 msec.  Given the patient's pacemaker dependent status, it was felt that this lead was inadequate and therefore, should be replaced today.  I therefore elected to place a new right ventricular lead.  A venogram of the right upper extremity was performed, which revealed a large right axillary vein, which emptied into a large right subclavian vein.  The right axillary vein was therefore cannulated.  Through the right axillary vein, a Medtronic model 5076 - 52 (serial number [id number]) right ventricular lead was advanced with fluoroscopic visualization into the right ventricular apex position.  In this location, R-waves measured 10 millivolts with an impedance of 592 ohms and a threshold of 0.6 volts at 0.5 milliseconds. The lead was secured to the pectoralis fascia using #2 silk over the suture sleeve.  The lead was then connected to a Medtronic [place], model [id number] (serial number [id number]) pacemaker.  The pocket was irrigated with copious gentamicin solution.  The previously implanted Medtronic model 4003 lead was capped and returned to the pocket.  The pocket was  then irrigated with copious gentamicin solution.  The pacemaker was then placed into the pocket.  The pocket was then closed in 2 layers with 2-0 Vicryl suture for the subcutaneous and subcuticular layers.  Steri- Strips and a sterile dressing were then applied.  There were no early apparent complications.  CONCLUSIONS: 1. Pacemaker at elective replacement indicator. 2. The previously implanted right ventricular lead had insulation     breach  and was felt to be unreliable long term. 3. Successful pacemaker system revision with a new right ventricular     lead placed and the previous lead capped. 4. No early apparent complications.     Hillis Range, MD     JA/MEDQ  D:  01/03/2012  T:  01/04/2012  Job:  [id number]  cc:   Thora Lance, M.D.

## 2012-01-09 ENCOUNTER — Telehealth: Payer: Self-pay | Admitting: Internal Medicine

## 2012-01-09 NOTE — Telephone Encounter (Signed)
New Problem:    Patient's wife called because since being discharged for having his 4th pacemaker implant the patient's Heart Rate has been 61 consistently.  Please call back.

## 2012-01-09 NOTE — Telephone Encounter (Signed)
Pt wife calls today for several reasons: 1.  She is concerned b/c heart rate has been consistently 61 each time they checked it at home for the past 4 days. 2,  She is concerned b/c he is still having a moderate amount of pain in the right shoulder and arm "He does not feel good" 3,  She has been giving pt  "my own hydrocodone 10mg  with a tylenol because they did not give him a prescription when he was discharged from the hospital" 4,  States he has been "shuffling" some but drowsy from the hydrocodone. Yet still having pain. Discussed proper dosage of Tylenol.   Pt, according to wife still having a lot of discomfort and it eases with the hydrocodone. According to wife no drainage from the pacer site. It is still swollen and bruised. He does continue to have pain right shoulder and arm. Wound check appt made for this Wednesday. Mylo Red RN

## 2012-01-09 NOTE — Telephone Encounter (Signed)
Patient to be seen by the device clinic 01/11/12 and we will address all the issues noted.  Patient is in agreement.

## 2012-01-11 ENCOUNTER — Ambulatory Visit: Payer: Medicare Other

## 2012-01-16 ENCOUNTER — Encounter: Payer: Self-pay | Admitting: Internal Medicine

## 2012-01-16 ENCOUNTER — Ambulatory Visit (INDEPENDENT_AMBULATORY_CARE_PROVIDER_SITE_OTHER): Payer: Medicare Other | Admitting: *Deleted

## 2012-01-16 ENCOUNTER — Telehealth: Payer: Self-pay | Admitting: Internal Medicine

## 2012-01-16 DIAGNOSIS — I442 Atrioventricular block, complete: Secondary | ICD-10-CM

## 2012-01-16 DIAGNOSIS — I4891 Unspecified atrial fibrillation: Secondary | ICD-10-CM

## 2012-01-16 LAB — PACEMAKER DEVICE OBSERVATION
BMOD-0003RV: 30
BRDY-0004RV: 120 {beats}/min
RV LEAD THRESHOLD: 0.5 V
VENTRICULAR PACING PM: 99

## 2012-01-16 NOTE — Progress Notes (Signed)
Wound check pacer in clinic  

## 2012-01-16 NOTE — Telephone Encounter (Signed)
Called and spoke with the pts wife and she stated that the pt has 1 weeks worth of meds left.  She is aware that we will have CY sign this on Monday when he returns to the office.  pts wife voiced her understanding of this and nothing further needed.

## 2012-01-30 ENCOUNTER — Ambulatory Visit (INDEPENDENT_AMBULATORY_CARE_PROVIDER_SITE_OTHER): Payer: Medicare Other | Admitting: *Deleted

## 2012-01-30 DIAGNOSIS — Z7901 Long term (current) use of anticoagulants: Secondary | ICD-10-CM

## 2012-01-30 DIAGNOSIS — I4891 Unspecified atrial fibrillation: Secondary | ICD-10-CM

## 2012-03-12 ENCOUNTER — Ambulatory Visit (INDEPENDENT_AMBULATORY_CARE_PROVIDER_SITE_OTHER): Payer: Medicare Other

## 2012-03-12 DIAGNOSIS — I4891 Unspecified atrial fibrillation: Secondary | ICD-10-CM

## 2012-03-12 DIAGNOSIS — Z7901 Long term (current) use of anticoagulants: Secondary | ICD-10-CM

## 2012-03-16 ENCOUNTER — Other Ambulatory Visit: Payer: Self-pay | Admitting: Neurology

## 2012-03-16 DIAGNOSIS — R413 Other amnesia: Secondary | ICD-10-CM

## 2012-03-16 DIAGNOSIS — G2401 Drug induced subacute dyskinesia: Secondary | ICD-10-CM

## 2012-03-16 DIAGNOSIS — F068 Other specified mental disorders due to known physiological condition: Secondary | ICD-10-CM

## 2012-03-19 ENCOUNTER — Ambulatory Visit
Admission: RE | Admit: 2012-03-19 | Discharge: 2012-03-19 | Disposition: A | Discharge status: Home / Self Care | Payer: Medicare Other | Source: Ambulatory Visit | Attending: Neurology | Admitting: Neurology

## 2012-03-19 DIAGNOSIS — F068 Other specified mental disorders due to known physiological condition: Secondary | ICD-10-CM

## 2012-03-19 DIAGNOSIS — G2401 Drug induced subacute dyskinesia: Secondary | ICD-10-CM

## 2012-03-19 DIAGNOSIS — R413 Other amnesia: Secondary | ICD-10-CM

## 2012-04-24 ENCOUNTER — Ambulatory Visit (INDEPENDENT_AMBULATORY_CARE_PROVIDER_SITE_OTHER): Payer: Medicare Other | Admitting: *Deleted

## 2012-04-24 DIAGNOSIS — Z7901 Long term (current) use of anticoagulants: Secondary | ICD-10-CM

## 2012-04-24 DIAGNOSIS — I4891 Unspecified atrial fibrillation: Secondary | ICD-10-CM

## 2012-05-02 ENCOUNTER — Ambulatory Visit (INDEPENDENT_AMBULATORY_CARE_PROVIDER_SITE_OTHER): Payer: Medicare Other | Admitting: Internal Medicine

## 2012-05-02 ENCOUNTER — Telehealth: Payer: Self-pay | Admitting: Internal Medicine

## 2012-05-02 ENCOUNTER — Encounter: Payer: Self-pay | Admitting: Internal Medicine

## 2012-05-02 VITALS — BP 100/65 | HR 70 | Ht 70.0 in | Wt 182.0 lb

## 2012-05-02 DIAGNOSIS — Z950 Presence of cardiac pacemaker: Secondary | ICD-10-CM

## 2012-05-02 DIAGNOSIS — I4891 Unspecified atrial fibrillation: Secondary | ICD-10-CM

## 2012-05-02 DIAGNOSIS — R911 Solitary pulmonary nodule: Secondary | ICD-10-CM

## 2012-05-02 LAB — PACEMAKER DEVICE OBSERVATION
BMOD-0003RV: 30
BMOD-0005RV: 95 {beats}/min
BRDY-0002RV: 60 {beats}/min
BRDY-0004RV: 120 {beats}/min
RV LEAD IMPEDENCE PM: 471 Ohm
RV LEAD THRESHOLD: 1 V
VENTRICULAR PACING PM: 100

## 2012-05-02 NOTE — Telephone Encounter (Signed)
t/w pt aware he has got to have the ct scan

## 2012-05-02 NOTE — Telephone Encounter (Signed)
Given the nodule noted on the CT 6 months ago, this needs to be repeated, especially with his history of transitional cell (renal) cancer.   I would encourage him to follow thru.

## 2012-05-02 NOTE — Telephone Encounter (Signed)
What is the reason for the CT scan pt just had one 3 months ago with his pulmonary provider

## 2012-05-02 NOTE — Progress Notes (Signed)
PCP: Lillia Mountain, MD  [name] is a 78 y.o. male who presents today for routine electrophysiology followup.  Since his recent pacemaker generator change, the patient reports doing very well.  Today, he denies symptoms of palpitations, chest pain, shortness of breath,  lower extremity edema, dizziness, presyncope, or syncope.  The patient is otherwise without complaint today.   Past Medical History  Diagnosis Date  . Allergic rhinitis   . RLS (restless legs syndrome)   . Permanent atrial fibrillation     on coumadin  . Esophageal reflux   . Bronchitis   . OSA (obstructive sleep apnea)   . Normal nuclear stress test 2013  . Transitional cell carcinoma     s/p nephrectomy in  2010  . Complete heart block     pacemaker originally placed in 1988  . BPH (benign prostatic hyperplasia)   . Lung nodule     noted on CXR December 2012  . Hypertension   . Dementia   . Pneumonia   . Pacemaker   . Renal insufficiency     Rt kidney removed d/t maglinant tumor  . Arthritis    Past Surgical History  Procedure Date  . Pacemaker insertion 1988    most recent generator change by Dr Johney Frame 20067/2/13 with new right ventricular lead placed  . Nephrectomy 2010    rt  . Hernia repair     multiple  . Repair ankle ligament   . US echocardiography 04/22/2008    EF 55-60%  . Doppler echocardiography 03/26/2001    EF 55%  . Cardiovascular stress test 03/23/2009    EF 65%, NORMAL  . Eye surgery     tumor removed from Rt eye lid    [name]  Medication Sig Dispense Refill  . amLODipine (NORVASC) 2.5 MG tablet Take 2.5 mg by mouth daily.      Marland Kitchen aspirin 81 MG tablet Take 81 mg by mouth daily.        Marland Kitchen atenolol (TENORMIN) 25 MG tablet Take 25 mg by mouth daily.      . clonazePAM (KLONOPIN) 2 MG tablet Take 2 mg by mouth at bedtime.        . cycloSPORINE (RESTASIS) 0.05 % ophthalmic emulsion Place 1 drop into both eyes 2 (two) times daily.        . Dietary  Management Product (AXONA) packet Take 40 g by mouth daily.      Marland Kitchen doxazosin (CARDURA) 8 MG tablet Take 8 mg by mouth at bedtime.       . finasteride (PROSCAR) 5 MG tablet Take 5 mg by mouth daily.      . furosemide (LASIX) 40 MG tablet Take 40 mg by mouth daily.       Marland Kitchen galantamine (RAZADYNE) 12 MG tablet Take 8 mg by mouth 2 (two) times daily.       . hydroxypropyl methylcellulose (ISOPTO TEARS) 2.5 % ophthalmic solution Place 1 drop into both eyes at bedtime.       Marland Kitchen ipratropium (ATROVENT) 0.06 % nasal spray Place 2 sprays into the nose daily.      . Multiple Vitamin (MULTIVITAMIN) capsule Take 1 capsule by mouth daily.        . nortriptyline (PAMELOR) 10 MG capsule Take 10 mg by mouth at bedtime.      . Omega-3 Fatty Acids (FISH OIL PO) Take 1,500 mg by mouth daily.      Marland Kitchen omeprazole (PRILOSEC) 20 MG capsule Take  20 mg by mouth daily.       . simvastatin (ZOCOR) 10 MG tablet Take 10 mg by mouth at bedtime.      Marland Kitchen warfarin (COUMADIN) 5 MG tablet Take 2.5-5 mg by mouth daily. Pt takes 5 mg on mon, wed, fri and 2.5 mg all other days        Physical Exam: Filed Vitals:   05/02/12 0911  BP: 100/65  Pulse: 70  Height: 5\' 10"  (1.778 m)  Weight: 182 lb (82.555 kg)    GEN- The patient is well appearing, alert and oriented x 3 today.   Head- normocephalic, atraumatic Eyes-  Sclera clear, conjunctiva pink Ears- hearing intact Oropharynx- clear Lungs- Clear to ausculation bilaterally, normal work of breathing Chest- pacemaker pocket is well healed Heart- Regular rate and rhythm (paced) GI- soft, NT, ND, + BS Extremities- no clubbing, cyanosis, or edema  Pacemaker interrogation- reviewed in detail today,  See PACEART report  Assessment and Plan:  1. Complete heart block Normal pacemaker function See Pace Art report No changes today  2. Afib Continue long term anticoagulation with coumadin We discussed pros and cons of novel anticoagulants today.  He would like to continue  coumadin at this time  3. Pulmonary nodule Per recommendation of radiology, we will obtain a noncontrast chest CT to follow-up on a prior pulmonary nodule at this time He will follow-up with Charlesetta Shanks in 6 months I will see again in 1 year  [facility] every 3 months

## 2012-05-02 NOTE — Patient Instructions (Addendum)
Remote monitoring is used to monitor your Pacemaker of ICD from home. This monitoring reduces the number of office visits required to check your device to one time per year. It allows [place] to keep an eye on the functioning of your device to ensure it is working properly. You are scheduled for a device check from home on August 06, 2012. You may send your transmission at any time that day. If you have a wireless device, the transmission will be sent automatically. After your physician reviews your transmission, you will receive a postcard with your next transmission date.  Your physician wants you to follow-up in: 6 months with Sunday Spillers and 12 months with Dr Jacquiline Doe will receive a reminder letter in the mail two months in advance. If you don't receive a letter, please call our office to schedule the follow-up appointment.     Non-Cardiac CT scanning, (CAT scanning), is a noninvasive, special x-ray that produces cross-sectional images of the body using xce. If you don't receive a letter, please call our office to schedule the follow-up appointment.-rays and a computer. CT scans help physicians diagnose and treat medical conditions. For some CT exams, a contrast material is used to enhance visibility in the area of the body being studied. CT scans provide greater clarity and reveal more details than regular x-ray exams.

## 2012-05-07 ENCOUNTER — Other Ambulatory Visit: Payer: Medicare Other

## 2012-05-08 ENCOUNTER — Ambulatory Visit (INDEPENDENT_AMBULATORY_CARE_PROVIDER_SITE_OTHER)
Admission: RE | Admit: 2012-05-08 | Discharge: 2012-05-08 | Disposition: A | Discharge status: Home / Self Care | Payer: Medicare Other | Source: Ambulatory Visit | Attending: Internal Medicine | Admitting: Internal Medicine

## 2012-05-08 DIAGNOSIS — R911 Solitary pulmonary nodule: Secondary | ICD-10-CM

## 2012-05-09 ENCOUNTER — Other Ambulatory Visit: Payer: Self-pay | Admitting: *Deleted

## 2012-05-09 MED ORDER — CLONAZEPAM 2 MG PO TABS: 2.0000 mg | ORAL_TABLET | Freq: Every day | ORAL | Status: DC

## 2012-05-14 ENCOUNTER — Other Ambulatory Visit: Payer: Self-pay | Admitting: Internal Medicine

## 2012-05-14 DIAGNOSIS — R0600 Dyspnea, unspecified: Secondary | ICD-10-CM

## 2012-05-15 ENCOUNTER — Encounter: Payer: Self-pay | Admitting: Internal Medicine

## 2012-05-15 ENCOUNTER — Ambulatory Visit (INDEPENDENT_AMBULATORY_CARE_PROVIDER_SITE_OTHER): Payer: Medicare Other | Admitting: Internal Medicine

## 2012-05-15 VITALS — BP 118/76 | HR 62 | Ht 70.0 in | Wt 179.6 lb

## 2012-05-15 DIAGNOSIS — J40 Bronchitis, not specified as acute or chronic: Secondary | ICD-10-CM

## 2012-05-15 DIAGNOSIS — G4733 Obstructive sleep apnea (adult) (pediatric): Secondary | ICD-10-CM

## 2012-05-15 DIAGNOSIS — G4701 Insomnia due to medical condition: Secondary | ICD-10-CM

## 2012-05-15 DIAGNOSIS — R0600 Dyspnea, unspecified: Secondary | ICD-10-CM

## 2012-05-15 DIAGNOSIS — G8929 Other chronic pain: Secondary | ICD-10-CM

## 2012-05-15 DIAGNOSIS — R0609 Other forms of dyspnea: Secondary | ICD-10-CM

## 2012-05-15 NOTE — Progress Notes (Signed)
Subjective:    Patient ID: [name], male    DOB: [birth year], 78 y.o.   MRN: [id number]  HPI 12/10/10- 66 yoM never smoker followed for OSA, hx Cheynes-Stokes, Restless legs, complicated by CM/ AFib/ brady-tachy/ pacer, allergic rhinitis, bronchitis, GERD. Hx right nephrectomy for cancer. Last here December 31, 2009 - note reviewed  CPAP continues to work fine, 7 cwp.  He likes his new machine.  Nortrpityline was given originally for depression and sleep. We discussed the possibility it was blurring his vision a little. He denies depression now.  He sleeps very well and is unaware of any limb movement disturbance now.   11/14/11- 81 yoM never smoker followed for OSA, hx Cheynes-Stokes, Restless legs, complicated by CM/ AFib/ brady-tachy/ pacer, allergic rhinitis, bronchitis, GERD. Hx right nephrectomy for cancer. Wears CPAP every night; States having Increased SOB as well-dizzy feelings as well; had pacemaker checked recently at cardiology and everything was normal. Describes episodes of feeling weak and dizzy and several situations, not always with exertion. Does think exercise tolerance has declined some. Insomnia with CPAP despite clonazepam 2 mg at bedtime. Sleep latency 2 hours. He thinks the main problem is arthritis and physical discomforts. PFT: 11/04/2011 normal spirometry with high normal lung volumes. FEV1/FVC 0.73. Insignificant response to bronchodilator. DLCO 118%. Room air oximetry with exercise: Ambulated several laps around our office today: 98% at rest with pulse 76. He did not drop below 97% saturation on room air and pulse rose no higher than 75. CT chest 10/28/11- images reviewed with him and his wife: IMPRESSION:  1. The plain film abnormality likely corresponds to a calcified  granuloma within the left upper lobe. There are other tiny  nodules, some of which are calcified. Given the history of  transitional cell carcinoma, follow-up with chest CT at 6 months  should be  considered.  2. No acute process in the chest. Cardiomegaly and small volume  pericardial effusion. The effusion is new since 06/14/2011.  3. Small volume perihepatic ascites. Incompletely imaged but felt  to be similar to 06/14/2011. Question fluid overload.  Original Report Authenticated By: Consuello Bossier, M.D.   05/15/12- 71 yoM never smoker followed for OSA, hx Cheynes-Stokes, Restless legs, complicated by CM/ AFib/ brady-tachy/ pacer, allergic rhinitis, bronchitis/ multiple granulomas, GERD. Hx right nephrectomy for cancer. Wears CPAP 7/ [employer] every night and presssure doing well; review with patient. Found out he needed new pacemaker-cause of SOB. Breathing is much better now. 05/15/12- 98%, 98%, 100%, 435 m. No discomfort. Good distance with no oxygen limit.  ROS-see HPI Constitutional:   No-   weight loss, night sweats, fevers, chills, fatigue, lassitude. HEENT:   No-  headaches, difficulty swallowing, tooth/dental problems, sore throat,       No-  sneezing, itching, ear ache, nasal congestion, post nasal drip,  CV:  No-   chest pain, orthopnea, PND, swelling in lower extremities, anasarca, +dizziness, palpitations Resp: Little shortness of breath with exertion or at rest.              No-   productive cough,  No non-productive cough,  No- coughing up of blood.              No-   change in color of mucus.  No- wheezing.   Skin: No-   rash or lesions. GI:  No-   heartburn, indigestion, abdominal pain, nausea, vomiting, GU:  MS:  No-   joint pain or swelling.   Neuro-  nothing unusual Psych:  No- change in mood or affect. No depression or anxiety.  No memory loss.  OBJ- Physical Exam General- Alert, Oriented, Affect-appropriate, Distress- none acute. Trim, fit appearing.  Skin- rash-none, lesions- none, excoriation- none Lymphadenopathy- none Head- atraumatic            Eyes- Gross vision intact, PERRLA, conjunctivae and secretions clear            Ears- Hearing,  canals-normal            Nose- Clear, no-Septal dev, mucus, polyps, erosion, perforation             Throat- Mallampati II , mucosa clear , drainage- none, tonsils- atrophic Neck- flexible , trachea midline, no stridor , thyroid nl, carotid no bruit Chest - symmetrical excursion , unlabored           Heart/CV- RRR , no murmur , no gallop  , no rub, nl s1 s2                           - JVD- none , edema- none, stasis changes- none, varices- none           Lung- clear to P&A, no rales, unlabored, wheeze- none, cough- none , dullness-none, rub- none           Chest wall- R pacemaker Abd-  Br/ Gen/ Rectal- Not done, not indicated Extrem- cyanosis- none, clubbing, none, atrophy- none, strength- nl Neuro- grossly intact to observation  Assessment & Plan:

## 2012-05-15 NOTE — Patient Instructions (Addendum)
We can  continue CPAP 7 [employer]  I am really glad your breathing is better. You did very well on the 6 minute walk test today with good oxygen scores and distance walked.  Please call as needed  Flu vax

## 2012-05-15 NOTE — Progress Notes (Signed)
  Subjective:  Documentation for 6 tminute walk test   Assessment & Plan:

## 2012-05-23 NOTE — Assessment & Plan Note (Signed)
Doing well with good compliance and control. No changes needed. We discussed CPAP comfort

## 2012-05-23 NOTE — Assessment & Plan Note (Signed)
Sleep quality is much improved but still affected by long term somatic discomforts.

## 2012-05-23 NOTE — Assessment & Plan Note (Signed)
Dyspnea was cardiogenic. Now improved. Multiple old calcified granulomas are benign

## 2012-06-04 ENCOUNTER — Ambulatory Visit (INDEPENDENT_AMBULATORY_CARE_PROVIDER_SITE_OTHER): Payer: Medicare Other | Admitting: *Deleted

## 2012-06-04 DIAGNOSIS — I4891 Unspecified atrial fibrillation: Secondary | ICD-10-CM

## 2012-06-04 DIAGNOSIS — Z7901 Long term (current) use of anticoagulants: Secondary | ICD-10-CM

## 2012-06-04 LAB — POCT INR: INR: 2.7

## 2012-07-16 ENCOUNTER — Ambulatory Visit (INDEPENDENT_AMBULATORY_CARE_PROVIDER_SITE_OTHER): Payer: Medicare Other | Admitting: *Deleted

## 2012-07-16 ENCOUNTER — Other Ambulatory Visit: Payer: Self-pay | Admitting: Dermatology

## 2012-07-16 DIAGNOSIS — I4891 Unspecified atrial fibrillation: Secondary | ICD-10-CM

## 2012-07-16 DIAGNOSIS — Z7901 Long term (current) use of anticoagulants: Secondary | ICD-10-CM

## 2012-07-16 LAB — POCT INR: INR: 2.8

## 2012-07-23 ENCOUNTER — Other Ambulatory Visit: Payer: Self-pay | Admitting: Internal Medicine

## 2012-07-23 MED ORDER — AMLODIPINE BESYLATE 2.5 MG PO TABS: 2.5000 mg | ORAL_TABLET | Freq: Every day | ORAL | Status: DC

## 2012-07-23 MED ORDER — FINASTERIDE 5 MG PO TABS: 5.0000 mg | ORAL_TABLET | Freq: Every day | ORAL | Status: DC

## 2012-08-03 ENCOUNTER — Telehealth: Payer: Self-pay | Admitting: Internal Medicine

## 2012-08-03 DIAGNOSIS — I4891 Unspecified atrial fibrillation: Secondary | ICD-10-CM

## 2012-08-03 MED ORDER — IPRATROPIUM BROMIDE 0.06 % NA SOLN: 2.0000 | Freq: Every day | NASAL | Status: DC

## 2012-08-03 NOTE — Telephone Encounter (Signed)
I spoke with pt spouse. She stated pt needed refill on his nasal spray [facility]. I have done so. Nothing further was needed

## 2012-08-06 ENCOUNTER — Other Ambulatory Visit: Payer: Self-pay | Admitting: Internal Medicine

## 2012-08-06 ENCOUNTER — Telehealth: Payer: Self-pay | Admitting: Internal Medicine

## 2012-08-06 ENCOUNTER — Ambulatory Visit (INDEPENDENT_AMBULATORY_CARE_PROVIDER_SITE_OTHER): Payer: Medicare Other | Admitting: *Deleted

## 2012-08-06 ENCOUNTER — Encounter: Payer: Self-pay | Admitting: Internal Medicine

## 2012-08-06 DIAGNOSIS — I442 Atrioventricular block, complete: Secondary | ICD-10-CM

## 2012-08-06 DIAGNOSIS — Z950 Presence of cardiac pacemaker: Secondary | ICD-10-CM

## 2012-08-06 MED ORDER — CLONAZEPAM 2 MG PO TABS: 2.0000 mg | ORAL_TABLET | Freq: Every day | ORAL | Status: DC

## 2012-08-06 NOTE — Telephone Encounter (Signed)
Ok to refill klonopin 

## 2012-08-06 NOTE — Telephone Encounter (Signed)
.  pt aware and rx called in

## 2012-08-06 NOTE — Telephone Encounter (Signed)
Pt requesting rx for  Klonopin 2mg  <> take 1 tablet by mouth at bed time.  No Known Allergies Last rx was sent fo r90 back on 05-09-12 Dr Maple Hudson please advise  Thank you

## 2012-08-10 ENCOUNTER — Telehealth: Payer: Self-pay | Admitting: Internal Medicine

## 2012-08-10 DIAGNOSIS — I4891 Unspecified atrial fibrillation: Secondary | ICD-10-CM

## 2012-08-10 MED ORDER — IPRATROPIUM BROMIDE 0.06 % NA SOLN: 2.0000 | Freq: Three times a day (TID) | NASAL | Status: DC | PRN

## 2012-08-10 NOTE — Telephone Encounter (Signed)
Pt is requesting the ipratropium to changed in the directions---2 sprays 2-3 times daily as needed from his old rx.  Pt stated that the new rx for this was 2 sprays daily.  Pt is requesting a 90 day supply of this medication sent in to the mail order.  CY please advise. Thanks  Last ov--05/2012 Next ov--05/2013  No Known Allergies

## 2012-08-10 NOTE — Telephone Encounter (Signed)
Per CY-okay Ipratropium nasal 0.6 2 puffs each nostril TID prn with 3 refills.

## 2012-08-10 NOTE — Telephone Encounter (Signed)
rx has been sent in to the pharmacy per pts request.  i called and spoke with pt and he is aware. Nothing further is needed.

## 2012-08-16 LAB — REMOTE PACEMAKER DEVICE
BATTERY VOLTAGE: 2.77 V
BRDY-0002RV: 60 {beats}/min
BRDY-0004RV: 120 {beats}/min
RV LEAD IMPEDENCE PM: 529 Ohm

## 2012-08-20 ENCOUNTER — Telehealth: Payer: Self-pay | Admitting: Internal Medicine

## 2012-08-20 NOTE — Telephone Encounter (Signed)
Called an spoke with pt. Still hasnt received his klonopin. Called [facility] per pharmacy tech this was sitting in limbo. rx will be sent to patient . Nothing further needed . Pt is aware.

## 2012-08-28 ENCOUNTER — Ambulatory Visit (INDEPENDENT_AMBULATORY_CARE_PROVIDER_SITE_OTHER): Payer: Medicare Other | Admitting: *Deleted

## 2012-08-28 LAB — POCT INR: INR: 2.6

## 2012-08-30 ENCOUNTER — Telehealth: Payer: Self-pay | Admitting: Internal Medicine

## 2012-08-30 NOTE — Telephone Encounter (Signed)
New Problem:    Called in wanting to know if he be off of his coumadin on the day of his dental implant procedure with their office.  Please call back.

## 2012-08-31 NOTE — Telephone Encounter (Signed)
Will forward to Dr Allred for review 

## 2012-09-01 NOTE — Telephone Encounter (Signed)
He should ask his dentist what is required.  A dental cleaning should not require that he hold his coumadin.  If required to hold it, he should hold the minimum time requested by his dentist and resume immediatly after the procedure.

## 2012-09-04 ENCOUNTER — Emergency Department (HOSPITAL_COMMUNITY): Payer: Medicare Other

## 2012-09-04 ENCOUNTER — Emergency Department (HOSPITAL_COMMUNITY)
Admission: EM | Admit: 2012-09-04 | Discharge: 2012-09-04 | Disposition: A | Discharge status: Home / Self Care | Payer: Medicare Other | Attending: Emergency Medicine | Admitting: Emergency Medicine

## 2012-09-04 ENCOUNTER — Encounter (HOSPITAL_COMMUNITY): Payer: Self-pay | Admitting: *Deleted

## 2012-09-04 DIAGNOSIS — Z8701 Personal history of pneumonia (recurrent): Secondary | ICD-10-CM | POA: Insufficient documentation

## 2012-09-04 DIAGNOSIS — K219 Gastro-esophageal reflux disease without esophagitis: Secondary | ICD-10-CM | POA: Insufficient documentation

## 2012-09-04 DIAGNOSIS — Z87448 Personal history of other diseases of urinary system: Secondary | ICD-10-CM | POA: Insufficient documentation

## 2012-09-04 DIAGNOSIS — Z85528 Personal history of other malignant neoplasm of kidney: Secondary | ICD-10-CM | POA: Insufficient documentation

## 2012-09-04 DIAGNOSIS — Z7901 Long term (current) use of anticoagulants: Secondary | ICD-10-CM | POA: Insufficient documentation

## 2012-09-04 DIAGNOSIS — Y92410 Unspecified street and highway as the place of occurrence of the external cause: Secondary | ICD-10-CM | POA: Insufficient documentation

## 2012-09-04 DIAGNOSIS — Z79899 Other long term (current) drug therapy: Secondary | ICD-10-CM | POA: Insufficient documentation

## 2012-09-04 DIAGNOSIS — W1809XA Striking against other object with subsequent fall, initial encounter: Secondary | ICD-10-CM | POA: Insufficient documentation

## 2012-09-04 DIAGNOSIS — Z950 Presence of cardiac pacemaker: Secondary | ICD-10-CM | POA: Insufficient documentation

## 2012-09-04 DIAGNOSIS — Z7982 Long term (current) use of aspirin: Secondary | ICD-10-CM | POA: Insufficient documentation

## 2012-09-04 DIAGNOSIS — Z8709 Personal history of other diseases of the respiratory system: Secondary | ICD-10-CM | POA: Insufficient documentation

## 2012-09-04 DIAGNOSIS — I10 Essential (primary) hypertension: Secondary | ICD-10-CM | POA: Insufficient documentation

## 2012-09-04 DIAGNOSIS — Y9301 Activity, walking, marching and hiking: Secondary | ICD-10-CM | POA: Insufficient documentation

## 2012-09-04 DIAGNOSIS — S0990XA Unspecified injury of head, initial encounter: Secondary | ICD-10-CM | POA: Insufficient documentation

## 2012-09-04 DIAGNOSIS — Z8739 Personal history of other diseases of the musculoskeletal system and connective tissue: Secondary | ICD-10-CM | POA: Insufficient documentation

## 2012-09-04 DIAGNOSIS — Z8679 Personal history of other diseases of the circulatory system: Secondary | ICD-10-CM | POA: Insufficient documentation

## 2012-09-04 DIAGNOSIS — I4891 Unspecified atrial fibrillation: Secondary | ICD-10-CM | POA: Insufficient documentation

## 2012-09-04 LAB — CBC WITH DIFFERENTIAL/PLATELET
HCT: 38 % — ABNORMAL LOW (ref 39.0–52.0)
Hemoglobin: 13 g/dL (ref 13.0–17.0)
Lymphocytes Relative: 27 % (ref 12–46)
Lymphs Abs: 1.3 10*3/uL (ref 0.7–4.0)
Monocytes Absolute: 0.4 10*3/uL (ref 0.1–1.0)
Monocytes Relative: 7 % (ref 3–12)
Neutro Abs: 3.1 10*3/uL (ref 1.7–7.7)
WBC: 4.9 10*3/uL (ref 4.0–10.5)

## 2012-09-04 LAB — BASIC METABOLIC PANEL
BUN: 31 mg/dL — ABNORMAL HIGH (ref 6–23)
CO2: 32 mEq/L (ref 19–32)
Calcium: 10 mg/dL (ref 8.4–10.5)
Chloride: 103 mEq/L (ref 96–112)
Creatinine, Ser: 1.65 mg/dL — ABNORMAL HIGH (ref 0.50–1.35)
GFR calc Af Amer: 44 mL/min — ABNORMAL LOW (ref >90)
GFR calc non Af Amer: 38 mL/min — ABNORMAL LOW (ref >90)
Glucose, Bld: 99 mg/dL (ref 70–99)
Potassium: 5.1 mEq/L (ref 3.5–5.1)
Sodium: 140 mEq/L (ref 135–145)

## 2012-09-04 LAB — APTT: aPTT: 39 seconds — ABNORMAL HIGH (ref 24–37)

## 2012-09-04 LAB — PROTIME-INR
INR: 2.79 — ABNORMAL HIGH (ref 0.00–1.49)
Prothrombin Time: 28 seconds — ABNORMAL HIGH (ref 11.6–15.2)

## 2012-09-04 MED ORDER — ACETAMINOPHEN 325 MG PO TABS
650.0000 mg | ORAL_TABLET | Freq: Once | ORAL | Status: AC
  Administered 2012-09-04: 650 mg via ORAL
  Filled 2012-09-04: qty 2

## 2012-09-04 NOTE — Telephone Encounter (Signed)
Follow up call    Status of coming off coumadin . Due to dental procedure 3/27.

## 2012-09-04 NOTE — ED Provider Notes (Signed)
I saw and evaluated the patient, reviewed the resident's note and I agree with the findings and plan.   Michael Chad, MD 09/04/12 [phone]

## 2012-09-04 NOTE — ED Notes (Signed)
Pt was walking down street and dog pulled him and he fell backwards and hit the back of his head.  Pt is on coumadin.  Repetitive questions since fall saying he needs to find his book per family

## 2012-09-04 NOTE — ED Provider Notes (Signed)
History     CSN: [id number]  Arrival date & time 09/04/12  1111   First MD Initiated Contact with Patient 09/04/12 1124      Chief Complaint  Patient presents with  . Fall    (Consider location/radiation/quality/duration/timing/severity/associated sxs/prior treatment) Patient is a 79 y.o. male presenting with fall.  Fall Pertinent negatives include no fever, no abdominal pain, no nausea, no vomiting, no hematuria and no headaches.   79 year old male who presents to the emergency department after a ground-level fall. Patient reports that he slipped and fell backwards onto his head. Patient with mild pain as skull base. Rates as a 3/10. Sharp. No radiation patient had some tingling where the c-collar was benign and arms. Since c-color has been readjusted no more tingling. No unilateral weakness. Numbness. No pain in hips lower back abdomen chest or other extremities. No other symptoms  Past Medical History  Diagnosis Date  . Allergic rhinitis   . RLS (restless legs syndrome)   . Permanent atrial fibrillation     on coumadin  . Esophageal reflux   . Bronchitis   . OSA (obstructive sleep apnea)   . Normal nuclear stress test 2013  . Transitional cell carcinoma     s/p nephrectomy in  2010  . Complete heart block     pacemaker originally placed in 1988  . BPH (benign prostatic hyperplasia)   . Lung nodule     noted on CXR December 2012  . Hypertension   . Dementia   . Pneumonia   . Pacemaker   . Renal insufficiency     Rt kidney removed d/t maglinant tumor  . Arthritis     Past Surgical History  Procedure Laterality Date  . Pacemaker insertion  1988    most recent generator change by Dr Johney Frame 20067/2/13 with new right ventricular lead placed  . Nephrectomy  2010    rt  . Hernia repair      multiple  . Repair ankle ligament    . US echocardiography  04/22/2008    EF 55-60%  . Doppler echocardiography  03/26/2001    EF 55%  . Cardiovascular stress test  03/23/2009      EF 65%, NORMAL  . Eye surgery      tumor removed from Rt eye lid    Family History  Problem Relation Age of Onset  . Stroke Mother   . Stroke Father     History  Substance Use Topics  . Smoking status: Never Smoker   . Smokeless tobacco: Not on file  . Alcohol Use: No      Review of Systems  Constitutional: Negative for fever and chills.  HENT: Negative for congestion, sore throat and neck pain.   Respiratory: Negative for cough.   Gastrointestinal: Negative for nausea, vomiting, abdominal pain, diarrhea and constipation.  Endocrine: Negative for polyuria.  Genitourinary: Negative for dysuria and hematuria.  Skin: Negative for rash.  Neurological: Negative for headaches.  Psychiatric/Behavioral: Negative.   All other systems reviewed and are negative.    Allergies  Review of patient's allergies indicates no known allergies.  Home Medications   [facility]  Name  Route  Sig  Dispense  Refill  . amLODipine (NORVASC) 2.5 MG tablet   Oral   Take 1 tablet (2.5 mg total) by mouth daily.   90 tablet   1   . aspirin 81 MG tablet   Oral   Take 81 mg by mouth daily.           Marland Kitchen  atenolol (TENORMIN) 25 MG tablet   Oral   Take 25 mg by mouth daily.         . clonazePAM (KLONOPIN) 2 MG tablet   Oral   Take 1 tablet (2 mg total) by mouth at bedtime.   90 tablet   0   . cycloSPORINE (RESTASIS) 0.05 % ophthalmic emulsion   Both Eyes   Place 1 drop into both eyes 2 (two) times daily.           Marland Kitchen doxazosin (CARDURA) 8 MG tablet   Oral   Take 8 mg by mouth at bedtime.          . finasteride (PROSCAR) 5 MG tablet   Oral   Take 1 tablet (5 mg total) by mouth daily.   90 tablet   1   . furosemide (LASIX) 40 MG tablet   Oral   Take 40 mg by mouth daily.          Marland Kitchen galantamine (RAZADYNE) 12 MG tablet   Oral   Take 8 mg by mouth 2 (two) times daily.          . hydroxypropyl methylcellulose (ISOPTO TEARS) 2.5 % ophthalmic solution    Both Eyes   Place 1 drop into both eyes at bedtime.          Marland Kitchen ipratropium (ATROVENT) 0.06 % nasal spray   Nasal   Place 2 sprays into the nose 3 (three) times daily as needed for rhinitis.   127 mL   3   . Multiple Vitamin (MULTIVITAMIN) capsule   Oral   Take 1 capsule by mouth daily.           . nortriptyline (PAMELOR) 10 MG capsule   Oral   Take 10 mg by mouth at bedtime.         . Omega-3 Fatty Acids (FISH OIL PO)   Oral   Take 1,500 mg by mouth daily.         Marland Kitchen omeprazole (PRILOSEC) 20 MG capsule   Oral   Take 20 mg by mouth daily.          . simvastatin (ZOCOR) 10 MG tablet   Oral   Take 10 mg by mouth at bedtime.         Marland Kitchen warfarin (COUMADIN) 5 MG tablet   Oral   Take 2.5-5 mg by mouth daily. Pt takes 5 mg on mon, wed, fri and 2.5 mg all other days           BP 130/71  Pulse 61  Temp(Src) 98.3 F (36.8 C) (Oral)  Resp 18  SpO2 99%  Physical Exam  Nursing note and vitals reviewed. Constitutional: He is oriented to person, place, and time. He appears well-developed and well-nourished. No distress.  HENT:  Head: Normocephalic and atraumatic.    Right Ear: External ear normal.  Left Ear: External ear normal.  Mouth/Throat: Oropharynx is clear and moist. No oropharyngeal exudate.  Eyes: Conjunctivae are normal. Pupils are equal, round, and reactive to light. Right eye exhibits no discharge.  Neck: Normal range of motion. Neck supple. No tracheal deviation present.  Cardiovascular: Normal rate, regular rhythm and intact distal pulses.   Pulmonary/Chest: Effort normal. No respiratory distress. He has no wheezes. He has no rales.  Abdominal: Soft. He exhibits no distension. There is no tenderness. There is no rebound and no guarding.  Musculoskeletal: Normal range of motion.  Neurological: He is alert and oriented to person,  place, and time.  Skin: Skin is warm and dry. No rash noted. He is not diaphoretic.  Psychiatric: He has a normal mood  and affect.    ED Course  Procedures (including critical care time)  Labs Reviewed  CBC WITH DIFFERENTIAL - Abnormal; Notable for the following:    RBC 3.96 (*)    HCT 38.0 (*)    All other components within normal limits  BASIC METABOLIC PANEL - Abnormal; Notable for the following:    BUN 31 (*)    Creatinine, Ser 1.65 (*)    GFR calc non Af Amer 38 (*)    GFR calc Af Amer 44 (*)    All other components within normal limits  APTT - Abnormal; Notable for the following:    aPTT 39 (*)    All other components within normal limits  PROTIME-INR - Abnormal; Notable for the following:    Prothrombin Time 28.0 (*)    INR 2.79 (*)    All other components within normal limits   Ct Head Wo Contrast  09/04/2012  *RADIOLOGY REPORT*  Clinical Data:  Larey Seat walking dog, striking back of head, on Coumadin  CT HEAD WITHOUT CONTRAST CT CERVICAL SPINE WITHOUT CONTRAST  Technique:  Multidetector CT imaging of the head and cervical spine was performed following the standard protocol without intravenous contrast.  Multiplanar CT image reconstructions of the cervical spine were also generated.  Comparison:  CT head 03/19/2012  CT HEAD  Findings: Generalized atrophy. Stable ventricular morphology with slight asymmetry of the lateral ventricles right larger than left unchanged. No midline shift or mass effect. Otherwise normal appearance of brain parenchyma. No intracranial hemorrhage, mass lesion, or evidence of acute infarction. No definite extra-axial fluid collections. Visualized paranasal sinuses and mastoid air cells clear. Skull intact.  IMPRESSION: No acute intracranial abnormalities.  CT CERVICAL SPINE  Findings: Prevertebral soft tissues normal thickness. Disc space narrowing and endplate spur formation C3-C4 through C6- C7. Mild osseous demineralization. Endplate and uncovertebral spurs encroach upon bilateral cervical neural foramina at C4-C5, less C5-C6 and C6-7, and particularly left C3-C4. Scattered  mild facet degenerative changes. Vertebral body heights maintained without fracture or subluxation. Visualized skull base intact. Degenerative changes at left articulation between C1-C2. Lung apices clear. Scattered atherosclerotic calcifications.  IMPRESSION: Multilevel degenerative disc and facet disease changes of the cervical spine as above. No acute cervical spine abnormalities.   Original Report Authenticated By: Ulyses Southward, M.D.    Ct Cervical Spine Wo Contrast  09/04/2012  *RADIOLOGY REPORT*  Clinical Data:  Larey Seat walking dog, striking back of head, on Coumadin  CT HEAD WITHOUT CONTRAST CT CERVICAL SPINE WITHOUT CONTRAST  Technique:  Multidetector CT imaging of the head and cervical spine was performed following the standard protocol without intravenous contrast.  Multiplanar CT image reconstructions of the cervical spine were also generated.  Comparison:  CT head 03/19/2012  CT HEAD  Findings: Generalized atrophy. Stable ventricular morphology with slight asymmetry of the lateral ventricles right larger than left unchanged. No midline shift or mass effect. Otherwise normal appearance of brain parenchyma. No intracranial hemorrhage, mass lesion, or evidence of acute infarction. No definite extra-axial fluid collections. Visualized paranasal sinuses and mastoid air cells clear. Skull intact.  IMPRESSION: No acute intracranial abnormalities.  CT CERVICAL SPINE  Findings: Prevertebral soft tissues normal thickness. Disc space narrowing and endplate spur formation C3-C4 through C6- C7. Mild osseous demineralization. Endplate and uncovertebral spurs encroach upon bilateral cervical neural foramina at C4-C5, less C5-C6 and C6-7,  and particularly left C3-C4. Scattered mild facet degenerative changes. Vertebral body heights maintained without fracture or subluxation. Visualized skull base intact. Degenerative changes at left articulation between C1-C2. Lung apices clear. Scattered atherosclerotic calcifications.   IMPRESSION: Multilevel degenerative disc and facet disease changes of the cervical spine as above. No acute cervical spine abnormalities.   Original Report Authenticated By: Ulyses Southward, M.D.      1. Fall, initial encounter       MDM   79 year old male with history of atrial fibrillation on Coumadin who presented to the emergency Department after mechanical ground level fall where he struck head. Exam benign with exception of small hematoma or occiput and C1. Head and cervical spine CTs ordered and negative. INR within therapeutic limits. Patient reexamined and pain-free. Able to clear C-spine clinically. No evidence of other injury. Return precautions given and patient safe for discharge. Patient discharged.       Arloa Koh, MD 09/04/12 1416

## 2012-09-04 NOTE — Telephone Encounter (Signed)
Spoke with August, patient is to have tooth extracted dn then implant done.  He will need to hold for 3 days prior.  Per Dr Jenel Lucks note ok to hold the minimum time requested which is 3 days and resume the same day.  August aware.  Faxing over note

## 2012-09-04 NOTE — ED Notes (Signed)
PT placed in cervical collar at triage for neck pain.  Pt family reports repetitive questions is not new for him.  Pt has dementia

## 2012-09-04 NOTE — ED Notes (Signed)
C collar applied. Pt walked in to Triage after falling at home.

## 2012-09-06 ENCOUNTER — Other Ambulatory Visit: Payer: Self-pay | Admitting: *Deleted

## 2012-09-06 ENCOUNTER — Telehealth: Payer: Self-pay | Admitting: Internal Medicine

## 2012-09-06 MED ORDER — ATENOLOL 25 MG PO TABS: 25.0000 mg | ORAL_TABLET | Freq: Every day | ORAL | Status: DC

## 2012-09-06 NOTE — Telephone Encounter (Signed)
New problem     Patient calling stating he stop taking amlodipine  . Due to low blood pressure.

## 2012-09-10 ENCOUNTER — Encounter: Payer: Self-pay | Admitting: *Deleted

## 2012-09-17 ENCOUNTER — Other Ambulatory Visit: Payer: Self-pay | Admitting: *Deleted

## 2012-09-17 ENCOUNTER — Other Ambulatory Visit: Payer: Self-pay | Admitting: Emergency Medicine

## 2012-09-17 MED ORDER — ATENOLOL 25 MG PO TABS: 25.0000 mg | ORAL_TABLET | Freq: Every day | ORAL | Status: DC

## 2012-09-17 MED ORDER — AMLODIPINE BESYLATE 2.5 MG PO TABS: 2.5000 mg | ORAL_TABLET | Freq: Every day | ORAL | Status: DC

## 2012-09-19 ENCOUNTER — Telehealth: Payer: Self-pay | Admitting: Internal Medicine

## 2012-09-19 NOTE — Telephone Encounter (Signed)
New problem    Pt is having problems with his BP 142/85.His family doc took him off his Amlodipine because his bp was low and now he is having problems with it going up. Pt would like to talk to a nurse concerning this matter.

## 2012-09-21 NOTE — Telephone Encounter (Signed)
lmom for patient to return my call 

## 2012-09-21 NOTE — Telephone Encounter (Signed)
BP have been running up since Dr Valentina Lucks stopped Amlodipine on   Dr Valentina Lucks asked him to stop meds and keep a record 120's/70's  Only had on high reading.  I have asked to to stay off med and contact Dr Valentina Lucks with further problems

## 2012-09-24 ENCOUNTER — Telehealth: Payer: Self-pay | Admitting: Internal Medicine

## 2012-09-24 ENCOUNTER — Other Ambulatory Visit: Payer: Self-pay | Admitting: *Deleted

## 2012-09-24 MED ORDER — WARFARIN SODIUM 5 MG PO TABS: ORAL_TABLET | ORAL | Status: DC

## 2012-09-24 MED ORDER — NORTRIPTYLINE HCL 10 MG PO CAPS: 10.0000 mg | ORAL_CAPSULE | Freq: Every day | ORAL | Status: DC

## 2012-09-24 NOTE — Telephone Encounter (Signed)
Rx has been sent in. Pt is aware. 

## 2012-09-24 NOTE — Telephone Encounter (Signed)
Pt requesting 90 day

## 2012-09-28 ENCOUNTER — Other Ambulatory Visit: Payer: Self-pay | Admitting: Internal Medicine

## 2012-09-28 DIAGNOSIS — R42 Dizziness and giddiness: Secondary | ICD-10-CM

## 2012-10-01 ENCOUNTER — Ambulatory Visit
Admission: RE | Admit: 2012-10-01 | Discharge: 2012-10-01 | Disposition: A | Discharge status: Home / Self Care | Payer: Medicare Other | Source: Ambulatory Visit | Attending: Internal Medicine | Admitting: Internal Medicine

## 2012-10-01 DIAGNOSIS — R42 Dizziness and giddiness: Secondary | ICD-10-CM

## 2012-10-08 ENCOUNTER — Ambulatory Visit (INDEPENDENT_AMBULATORY_CARE_PROVIDER_SITE_OTHER): Payer: Medicare Other | Admitting: *Deleted

## 2012-10-08 ENCOUNTER — Other Ambulatory Visit: Payer: Self-pay | Admitting: *Deleted

## 2012-10-08 DIAGNOSIS — Z7901 Long term (current) use of anticoagulants: Secondary | ICD-10-CM

## 2012-10-08 DIAGNOSIS — I4891 Unspecified atrial fibrillation: Secondary | ICD-10-CM

## 2012-10-08 LAB — POCT INR: INR: 3.3

## 2012-10-08 MED ORDER — SIMVASTATIN 10 MG PO TABS: 10.0000 mg | ORAL_TABLET | Freq: Every day | ORAL | Status: DC

## 2012-10-29 ENCOUNTER — Ambulatory Visit (INDEPENDENT_AMBULATORY_CARE_PROVIDER_SITE_OTHER): Payer: Medicare Other | Admitting: *Deleted

## 2012-10-29 DIAGNOSIS — I4891 Unspecified atrial fibrillation: Secondary | ICD-10-CM

## 2012-10-29 DIAGNOSIS — Z7901 Long term (current) use of anticoagulants: Secondary | ICD-10-CM

## 2012-10-29 LAB — POCT INR: INR: 2.6

## 2012-11-02 ENCOUNTER — Ambulatory Visit (INDEPENDENT_AMBULATORY_CARE_PROVIDER_SITE_OTHER): Payer: Medicare Other | Admitting: Surgery

## 2012-11-02 ENCOUNTER — Encounter (INDEPENDENT_AMBULATORY_CARE_PROVIDER_SITE_OTHER): Payer: Self-pay | Admitting: Surgery

## 2012-11-02 VITALS — BP 126/80 | HR 60 | Temp 97.5°F | Resp 16 | Ht 70.0 in | Wt 177.0 lb

## 2012-11-02 DIAGNOSIS — R221 Localized swelling, mass and lump, neck: Secondary | ICD-10-CM | POA: Insufficient documentation

## 2012-11-02 DIAGNOSIS — R220 Localized swelling, mass and lump, head: Secondary | ICD-10-CM

## 2012-11-02 NOTE — Progress Notes (Signed)
NAME: [name] DOB: 06/11/1930 MRN: [id number]                                                                                      DATE: 11/02/2012  PCP: GRIFFIN,JOHN JOSEPH, MD Referring Provider: Griffin, John Joseph, MD  IMPRESSION:  Gradually enlarging left posterior neck mass, by palpation most likely lipoma  PLAN:   I reviewed the options with the patient and his wife. This is certainly amenable to excision under local anesthesia. It has been asymptomatic and only gradually enlarged and present for over 12 years so it is likely benign. Continued observation is also an option. I reviewed this with them and he would like to have this removed as he believes it is symptomatic and causing him some pain. We will go ahead and schedule this. He'll stop his Coumadin about 5 days preoperatively with plans to start it the night of surgery.                 CC:  Chief Complaint  Patient presents with  . Mass    eval enlarging mass on neck    HPI:  [name] is a 79 y.o.  male who presents for evaluation of a mass of the left neck. Who is referred by his dermatologist, Dr. Frank Houston. The patient notes that this mass has been present for over 12 years. However, over the last 2 or 3 years it has been enlarging and become painful. He does have any other similar lumps in place on his body. This has never been biopsied or had any other workup.  PMH:  has a past medical history of Allergic rhinitis; RLS (restless legs syndrome); Permanent atrial fibrillation; Esophageal reflux; Bronchitis; OSA (obstructive sleep apnea); Normal nuclear stress test (2013); Transitional cell carcinoma; Complete heart block; BPH (benign prostatic hyperplasia); Lung nodule; Hypertension; Dementia; Pneumonia; Pacemaker; Renal insufficiency; and Arthritis.  PSH:   has past surgical history that includes Pacemaker insertion (1988); Nephrectomy (2010); Hernia repair; Repair ankle ligament; us echocardiography  (04/22/2008); doppler echocardiography (03/26/2001); Cardiovascular stress test (03/23/2009); and Eye surgery.  ALLERGIES:  No Known Allergies  MEDICATIONS: [place] outpatient prescriptions:amLODipine (NORVASC) 2.5 MG tablet, Take 1 tablet (2.5 mg total) by mouth daily., Disp: 90 tablet, Rfl: 1;  aspirin 81 MG tablet, Take 81 mg by mouth daily.  , Disp: , Rfl: ;  atenolol (TENORMIN) 25 MG tablet, Take 1 tablet (25 mg total) by mouth daily., Disp: 90 tablet, Rfl: 4;  clonazePAM (KLONOPIN) 2 MG tablet, Take 1 tablet (2 mg total) by mouth at bedtime., Disp: 90 tablet, Rfl: 0 cycloSPORINE (RESTASIS) 0.05 % ophthalmic emulsion, Place 1 drop into both eyes 2 (two) times daily. , Disp: , Rfl: ;  doxazosin (CARDURA) 8 MG tablet, Take 2 mg by mouth at bedtime. , Disp: , Rfl: ;  finasteride (PROSCAR) 5 MG tablet, Take 1 tablet (5 mg total) by mouth daily., Disp: 90 tablet, Rfl: 1;  furosemide (LASIX) 40 MG tablet, Take 40 mg by mouth daily. , Disp: , Rfl:  galantamine (RAZADYNE) 8 MG tablet, Take 8 mg by mouth 2 (two) times daily., Disp: , Rfl: ;    hydroxypropyl methylcellulose (ISOPTO TEARS) 2.5 % ophthalmic solution, Place 1 drop into both eyes at bedtime. , Disp: , Rfl: ;  ipratropium (ATROVENT) 0.06 % nasal spray, Place 2 sprays into the nose 3 (three) times daily as needed for rhinitis., Disp: 127 mL, Rfl: 3;  Multiple Vitamin (MULTIVITAMIN) capsule, Take 1 capsule by mouth daily.  , Disp: , Rfl:  nortriptyline (PAMELOR) 10 MG capsule, Take 1 capsule (10 mg total) by mouth at bedtime., Disp: 90 capsule, Rfl: 1;  Omega-3 Fatty Acids (FISH OIL PO), Take 1,500 mg by mouth daily., Disp: , Rfl: ;  omeprazole (PRILOSEC) 20 MG capsule, Take 20 mg by mouth daily. , Disp: , Rfl: ;  simvastatin (ZOCOR) 10 MG tablet, Take 1 tablet (10 mg total) by mouth at bedtime., Disp: 90 tablet, Rfl: 2 warfarin (COUMADIN) 5 MG tablet, Take as directed per Anticoagulation Clinic, Disp: 90 tablet, Rfl: 1  ROS: He has filled out our 12  point review of systems and it is negative . EXAM:   Vital signs:BP 126/80  Pulse 60  Temp(Src) 97.5 F (36.4 C) (Oral)  Resp 16  Ht 5' 10" (1.778 m)  Wt 177 lb (80.287 kg)  BMI 25.4 kg/m2 General: impression is alert and healthy-appearing Neck: There is a soft 2.5 x 1.5 cm left posterior cervical mass that is mobile. I do not see an overlying punctum. It is not tender. There are no other masses in the neck. Thyroid is normal.  DATA REVIEWED:  I have reviewed her electronic medical record in the nose from the dermatologist    Dorraine Ellender J 11/02/2012  CC: Griffin, John Joseph, MD, GRIFFIN,JOHN JOSEPH, MD        

## 2012-11-02 NOTE — Patient Instructions (Signed)
We will schedule surgery to remove the small mass from the left side of YOUR neck

## 2012-11-05 NOTE — Progress Notes (Signed)
Coming for local procedure-pt is stopping coumadin- Arrive 30 min prior-bring meds-take am meds-light breakfast- Wife will come with him

## 2012-11-07 ENCOUNTER — Encounter (HOSPITAL_BASED_OUTPATIENT_CLINIC_OR_DEPARTMENT_OTHER)
Admission: RE | Disposition: A | Discharge status: Home / Self Care | Payer: Self-pay | Source: Ambulatory Visit | Attending: Surgery

## 2012-11-07 ENCOUNTER — Ambulatory Visit (HOSPITAL_BASED_OUTPATIENT_CLINIC_OR_DEPARTMENT_OTHER)
Admission: RE | Admit: 2012-11-07 | Discharge: 2012-11-07 | Disposition: A | Discharge status: Home / Self Care | Payer: Medicare Other | Source: Ambulatory Visit | Attending: Surgery | Admitting: Surgery

## 2012-11-07 DIAGNOSIS — K219 Gastro-esophageal reflux disease without esophagitis: Secondary | ICD-10-CM | POA: Insufficient documentation

## 2012-11-07 DIAGNOSIS — R221 Localized swelling, mass and lump, neck: Secondary | ICD-10-CM

## 2012-11-07 DIAGNOSIS — Z8701 Personal history of pneumonia (recurrent): Secondary | ICD-10-CM | POA: Insufficient documentation

## 2012-11-07 DIAGNOSIS — G4733 Obstructive sleep apnea (adult) (pediatric): Secondary | ICD-10-CM | POA: Insufficient documentation

## 2012-11-07 DIAGNOSIS — D1739 Benign lipomatous neoplasm of skin and subcutaneous tissue of other sites: Secondary | ICD-10-CM

## 2012-11-07 DIAGNOSIS — Z85828 Personal history of other malignant neoplasm of skin: Secondary | ICD-10-CM | POA: Insufficient documentation

## 2012-11-07 DIAGNOSIS — G2581 Restless legs syndrome: Secondary | ICD-10-CM | POA: Insufficient documentation

## 2012-11-07 DIAGNOSIS — J40 Bronchitis, not specified as acute or chronic: Secondary | ICD-10-CM | POA: Insufficient documentation

## 2012-11-07 DIAGNOSIS — N400 Enlarged prostate without lower urinary tract symptoms: Secondary | ICD-10-CM | POA: Insufficient documentation

## 2012-11-07 DIAGNOSIS — I10 Essential (primary) hypertension: Secondary | ICD-10-CM | POA: Insufficient documentation

## 2012-11-07 DIAGNOSIS — I4891 Unspecified atrial fibrillation: Secondary | ICD-10-CM | POA: Insufficient documentation

## 2012-11-07 DIAGNOSIS — F0390 Unspecified dementia without behavioral disturbance: Secondary | ICD-10-CM | POA: Insufficient documentation

## 2012-11-07 DIAGNOSIS — M129 Arthropathy, unspecified: Secondary | ICD-10-CM | POA: Insufficient documentation

## 2012-11-07 DIAGNOSIS — N289 Disorder of kidney and ureter, unspecified: Secondary | ICD-10-CM | POA: Insufficient documentation

## 2012-11-07 DIAGNOSIS — I442 Atrioventricular block, complete: Secondary | ICD-10-CM | POA: Insufficient documentation

## 2012-11-07 HISTORY — PX: MASS EXCISION: SHX2000

## 2012-11-07 SURGERY — MINOR EXCISION OF MASS
Anesthesia: LOCAL | Site: Neck | Laterality: Left | Wound class: Clean

## 2012-11-07 MED ORDER — CHLORHEXIDINE GLUCONATE 4 % EX LIQD: 1.0000 "application " | Freq: Once | CUTANEOUS | Status: DC

## 2012-11-07 MED ORDER — SODIUM BICARBONATE 4 % IV SOLN
INTRAVENOUS | Status: DC | PRN
  Administered 2012-11-07: 14:00:00

## 2012-11-07 MED ORDER — LIDOCAINE-EPINEPHRINE (PF) 1 %-1:200000 IJ SOLN: INTRAMUSCULAR | Status: DC | PRN

## 2012-11-07 SURGICAL SUPPLY — 36 items
ADH SKN CLS APL DERMABOND .7 (GAUZE/BANDAGES/DRESSINGS) ×1
APL SKNCLS STERI-STRIP NONHPOA (GAUZE/BANDAGES/DRESSINGS)
BENZOIN TINCTURE PRP APPL 2/3 (GAUZE/BANDAGES/DRESSINGS) IMPLANT
BLADE SURG 15 STRL LF DISP TIS (BLADE) ×1 IMPLANT
BLADE SURG 15 STRL SS (BLADE) ×2
CLOTH BEACON ORANGE TIMEOUT ST (SAFETY) ×2 IMPLANT
DERMABOND ADVANCED (GAUZE/BANDAGES/DRESSINGS) ×1
DERMABOND ADVANCED .7 DNX12 (GAUZE/BANDAGES/DRESSINGS) IMPLANT
DRAPE SURG 17X23 STRL (DRAPES) IMPLANT
DRSG TEGADERM 4X4.75 (GAUZE/BANDAGES/DRESSINGS) IMPLANT
ELECT REM PT RETURN 9FT ADLT (ELECTROSURGICAL)
ELECTRODE REM PT RTRN 9FT ADLT (ELECTROSURGICAL) IMPLANT
GAUZE SPONGE 4X4 12PLY STRL LF (GAUZE/BANDAGES/DRESSINGS) IMPLANT
GAUZE SPONGE 4X4 16PLY XRAY LF (GAUZE/BANDAGES/DRESSINGS) IMPLANT
GLOVE BIO SURGEON STRL SZ 6.5 (GLOVE) ×2 IMPLANT
GLOVE EUDERMIC 7 POWDERFREE (GLOVE) ×2 IMPLANT
MARKER SKIN DUAL TIP RULER LAB (MISCELLANEOUS) ×1 IMPLANT
NDL HYPO 25X1 1.5 SAFETY (NEEDLE) ×1 IMPLANT
NDL SAFETY ECLIPSE 18X1.5 (NEEDLE) ×1 IMPLANT
NEEDLE HYPO 18GX1.5 SHARP (NEEDLE)
NEEDLE HYPO 25X1 1.5 SAFETY (NEEDLE) ×2 IMPLANT
PENCIL BUTTON HOLSTER BLD 10FT (ELECTRODE) IMPLANT
STRIP CLOSURE SKIN 1/2X4 (GAUZE/BANDAGES/DRESSINGS) IMPLANT
SUT ETHILON 3 0 PS 1 (SUTURE) IMPLANT
SUT ETHILON 4 0 PS 2 18 (SUTURE) IMPLANT
SUT MNCRL AB 4-0 PS2 18 (SUTURE) IMPLANT
SUT PROLENE 3 0 PS 2 (SUTURE) IMPLANT
SUT PROLENE 5 0 P 3 (SUTURE) IMPLANT
SUT VIC AB 3-0 FS2 27 (SUTURE) IMPLANT
SUT VIC AB 4-0 BRD 54 (SUTURE) IMPLANT
SUT VIC AB 4-0 P-3 18XBRD (SUTURE) IMPLANT
SUT VIC AB 4-0 P3 18 (SUTURE)
SUT VIC AB 4-0 SH 18 (SUTURE) ×2 IMPLANT
SWABSTICK POVIDONE IODINE SNGL (MISCELLANEOUS) ×4 IMPLANT
SYR CONTROL 10ML LL (SYRINGE) ×2 IMPLANT
TOWEL OR 17X24 6PK STRL BLUE (TOWEL DISPOSABLE) ×1 IMPLANT

## 2012-11-07 NOTE — H&P (View-Only) (Signed)
NAME: [name] DOB: June 14, 1930 MRN: [id number]                                                                                      DATE: 11/02/2012  PCP: Lillia Mountain, MD Referring Provider: Lillia Mountain, MD  IMPRESSION:  Gradually enlarging left posterior neck mass, by palpation most likely lipoma  PLAN:   I reviewed the options with the patient and his wife. This is certainly amenable to excision under local anesthesia. It has been asymptomatic and only gradually enlarged and present for over 12 years so it is likely benign. Continued observation is also an option. I reviewed this with them and he would like to have this removed as he believes it is symptomatic and causing him some pain. We will go ahead and schedule this. He'll stop his Coumadin about 5 days preoperatively with plans to start it the night of surgery.                 CC:  Chief Complaint  Patient presents with  . Mass    eval enlarging mass on neck    HPI:  [name] is a 79 y.o.  male who presents for evaluation of a mass of the left neck. Who is referred by his dermatologist, Dr. Leta Speller. The patient notes that this mass has been present for over 12 years. However, over the last 2 or 3 years it has been enlarging and become painful. He does have any other similar lumps in place on his body. This has never been biopsied or had any other workup.  PMH:  has a past medical history of Allergic rhinitis; RLS (restless legs syndrome); Permanent atrial fibrillation; Esophageal reflux; Bronchitis; OSA (obstructive sleep apnea); Normal nuclear stress test (2013); Transitional cell carcinoma; Complete heart block; BPH (benign prostatic hyperplasia); Lung nodule; Hypertension; Dementia; Pneumonia; Pacemaker; Renal insufficiency; and Arthritis.  PSH:   has past surgical history that includes Pacemaker insertion (1988); Nephrectomy (2010); Hernia repair; Repair ankle ligament; US echocardiography  (04/22/2008); doppler echocardiography (03/26/2001); Cardiovascular stress test (03/23/2009); and Eye surgery.  ALLERGIES:  No Known Allergies  MEDICATIONS: [place] outpatient prescriptions:amLODipine (NORVASC) 2.5 MG tablet, Take 1 tablet (2.5 mg total) by mouth daily., Disp: 90 tablet, Rfl: 1;  aspirin 81 MG tablet, Take 81 mg by mouth daily.  , Disp: , Rfl: ;  atenolol (TENORMIN) 25 MG tablet, Take 1 tablet (25 mg total) by mouth daily., Disp: 90 tablet, Rfl: 4;  clonazePAM (KLONOPIN) 2 MG tablet, Take 1 tablet (2 mg total) by mouth at bedtime., Disp: 90 tablet, Rfl: 0 cycloSPORINE (RESTASIS) 0.05 % ophthalmic emulsion, Place 1 drop into both eyes 2 (two) times daily. , Disp: , Rfl: ;  doxazosin (CARDURA) 8 MG tablet, Take 2 mg by mouth at bedtime. , Disp: , Rfl: ;  finasteride (PROSCAR) 5 MG tablet, Take 1 tablet (5 mg total) by mouth daily., Disp: 90 tablet, Rfl: 1;  furosemide (LASIX) 40 MG tablet, Take 40 mg by mouth daily. , Disp: , Rfl:  galantamine (RAZADYNE) 8 MG tablet, Take 8 mg by mouth 2 (two) times daily., Disp: , Rfl: ;  hydroxypropyl methylcellulose (ISOPTO TEARS) 2.5 % ophthalmic solution, Place 1 drop into both eyes at bedtime. , Disp: , Rfl: ;  ipratropium (ATROVENT) 0.06 % nasal spray, Place 2 sprays into the nose 3 (three) times daily as needed for rhinitis., Disp: 127 mL, Rfl: 3;  Multiple Vitamin (MULTIVITAMIN) capsule, Take 1 capsule by mouth daily.  , Disp: , Rfl:  nortriptyline (PAMELOR) 10 MG capsule, Take 1 capsule (10 mg total) by mouth at bedtime., Disp: 90 capsule, Rfl: 1;  Omega-3 Fatty Acids (FISH OIL PO), Take 1,500 mg by mouth daily., Disp: , Rfl: ;  omeprazole (PRILOSEC) 20 MG capsule, Take 20 mg by mouth daily. , Disp: , Rfl: ;  simvastatin (ZOCOR) 10 MG tablet, Take 1 tablet (10 mg total) by mouth at bedtime., Disp: 90 tablet, Rfl: 2 warfarin (COUMADIN) 5 MG tablet, Take as directed per Anticoagulation Clinic, Disp: 90 tablet, Rfl: 1  ROS: He has filled out our 12  point review of systems and it is negative . EXAM:   Vital signs:BP 126/80  Pulse 60  Temp(Src) 97.5 F (36.4 C) (Oral)  Resp 16  Ht 5\' 10"  (1.778 m)  Wt 177 lb (80.287 kg)  BMI 25.4 kg/m2 General: impression is alert and healthy-appearing Neck: There is a soft 2.5 x 1.5 cm left posterior cervical mass that is mobile. I do not see an overlying punctum. It is not tender. There are no other masses in the neck. Thyroid is normal.  DATA REVIEWED:  I have reviewed her electronic medical record in the nose from the dermatologist    Lynisha Osuch J 11/02/2012  CC: Lillia Mountain, MD, Lillia Mountain, MD

## 2012-11-07 NOTE — Interval H&P Note (Signed)
History and Physical Interval Note:  11/07/2012 1:40 PM  Windy Fast D Chrostowski  has presented today for surgery, with the diagnosis of mass left neck prob lipoma  The various methods of treatment have been discussed with the patient and family. After consideration of risks, benefits and other options for treatment, the patient has consented to  Procedure(s): MINOR EXCISION OF MASS (Left) as a surgical intervention .  The patient's history has been reviewed, patient examined, no change in status, stable for surgery.  I have reviewed the patient's chart and labs.  Questions were answered to the patient's satisfaction.     Danaysia Rader J

## 2012-11-07 NOTE — Op Note (Signed)
[name] 11-11-1929 [id number] 11/02/2012  Preoperative diagnosis: Mass, left posterior neck, clinically a lipoma, symptomatic  Postoperative diagnosis: same  Procedure: removal of 2 cm subcutaneous mass left posterior neck  Surgeon: Currie Paris, MD, FACS  Anesthesia: 11 cc of Xylocaine with epinephrine local, 6 cc total   Clinical History and Indications: this patient has a soft mobile slightly tender mass in the left posterior triangle which he wished to have removed because is having discomfort. Clinically I thought this was a lipoma.    Description of Procedure: I saw the patient  Area confirmed the plans and marked the mass. Was taken to the operating room and timeout was done. The area the mass was anesthetized with about 6 cc of 1% Xylocaine with epinephrine and "neut."  I waited a few minutes and had excellent anesthesia. I made a short incision directly in a skin crease and excised subcutaneous mass for plate. I used magnification. It appeared to be a lipoma. I thought it came out completely. There is no significant bleeding.  Incision was closed with 3-0 Vicryl, 4-0 Monocryl subcuticular and Dermabond. The patient tolerated procedure well. There were no complications.blood loss was minimal. The specimen sent to pathology.  Currie Paris, MD, FACS 11/07/2012 2:19 PM

## 2012-11-08 ENCOUNTER — Encounter (HOSPITAL_BASED_OUTPATIENT_CLINIC_OR_DEPARTMENT_OTHER): Payer: Self-pay | Admitting: Surgery

## 2012-11-08 ENCOUNTER — Telehealth (INDEPENDENT_AMBULATORY_CARE_PROVIDER_SITE_OTHER): Payer: Self-pay | Admitting: General Surgery

## 2012-11-08 NOTE — Telephone Encounter (Signed)
Patient made aware of path results. Will follow up at appt and call with any questions prior.  

## 2012-11-08 NOTE — Telephone Encounter (Signed)
Message copied by Liliana Cline on Thu Nov 08, 2012  1:59 PM ------      Message from: Currie Paris      Created: Thu Nov 08, 2012  1:37 PM       Tell her path is benign and as expected ------

## 2012-11-12 ENCOUNTER — Telehealth: Payer: Self-pay | Admitting: Internal Medicine

## 2012-11-12 ENCOUNTER — Ambulatory Visit (INDEPENDENT_AMBULATORY_CARE_PROVIDER_SITE_OTHER): Payer: Medicare Other | Admitting: *Deleted

## 2012-11-12 DIAGNOSIS — I442 Atrioventricular block, complete: Secondary | ICD-10-CM

## 2012-11-12 DIAGNOSIS — Z950 Presence of cardiac pacemaker: Secondary | ICD-10-CM

## 2012-11-12 MED ORDER — CLONAZEPAM 2 MG PO TABS: 2.0000 mg | ORAL_TABLET | Freq: Every day | ORAL | Status: DC

## 2012-11-12 NOTE — Telephone Encounter (Signed)
Ok to refill Klonopin as before

## 2012-11-12 NOTE — Telephone Encounter (Signed)
Klonopin 2 mg last refilled #90 x 0 refills. Last OV 05/15/12. Pending OV 05/20/13. Please advise Dr. Maple Hudson thanks

## 2012-11-12 NOTE — Telephone Encounter (Signed)
[facility] RX in but was advised hold time for pharmacists was 10 min. I advised will fax this in. Pt is aware. Nothing further was needed

## 2012-11-13 ENCOUNTER — Encounter: Payer: Self-pay | Admitting: Internal Medicine

## 2012-11-16 ENCOUNTER — Encounter (INDEPENDENT_AMBULATORY_CARE_PROVIDER_SITE_OTHER): Payer: Self-pay | Admitting: Surgery

## 2012-11-16 ENCOUNTER — Telehealth: Payer: Self-pay | Admitting: Internal Medicine

## 2012-11-16 ENCOUNTER — Ambulatory Visit (INDEPENDENT_AMBULATORY_CARE_PROVIDER_SITE_OTHER): Payer: Medicare Other | Admitting: Surgery

## 2012-11-16 VITALS — BP 118/70 | HR 78 | Temp 96.5°F | Ht 70.0 in | Wt 177.2 lb

## 2012-11-16 DIAGNOSIS — Z09 Encounter for follow-up examination after completed treatment for conditions other than malignant neoplasm: Secondary | ICD-10-CM

## 2012-11-16 DIAGNOSIS — R221 Localized swelling, mass and lump, neck: Secondary | ICD-10-CM

## 2012-11-16 NOTE — Progress Notes (Signed)
NAME: [name]                                            DOB: 1930-01-17 DATE: 11/16/2012                                                  MRN: [id number]  CC:  Chief Complaint  Patient presents with  . Routine Post Op    Lt neck mass removal    HPI: This patient comes in for post op follow-up .Heunderwent removal of a left neck mass on 11/07/12. He feels that he is doing well.  PE:  VITAL SIGNS: BP 118/70  Pulse 78  Temp(Src) 96.5 F (35.8 C) (Temporal)  Ht 5\' 10"  (1.778 m)  Wt 177 lb 3.2 oz (80.377 kg)  BMI 25.43 kg/m2  SpO2 98%  General: The patient appears to be healthy, NAD Neck: Incision healing nicely with no evidence of infection  DATA REVIEWED: Diagnosis Soft tissue, lipoma, Left neck - LOBULATED ADIPOSE TISSUE, CONSISTENT WITH CLINICALLY STATED LIPOMA. Pecola Leisure MD Pathologist, Electronic Signature  IMPRESSION: The patient is doing well S/P removal of lipoma from left posterior neck.    PLAN: We'll see again when necessary. I gave him a copy of his pathology report.

## 2012-11-16 NOTE — Telephone Encounter (Signed)
Spoke with pt he received his klonopin today in the mail  Nothing further needed.

## 2012-11-16 NOTE — Patient Instructions (Signed)
We will see you again on an as needed basis. Please call the office at [phone] if you have any questions or concerns. Thank you for allowing us to take care of you.  

## 2012-11-23 ENCOUNTER — Telehealth: Payer: Self-pay | Admitting: Internal Medicine

## 2012-11-23 DIAGNOSIS — G4733 Obstructive sleep apnea (adult) (pediatric): Secondary | ICD-10-CM

## 2012-11-23 NOTE — Telephone Encounter (Signed)
Spoke with patient-aware that he would need to go through his DME company to get this and no order should be needed from our office.

## 2012-11-23 NOTE — Telephone Encounter (Signed)
I spoke with the pt and he states he called [facility] and was trying to get a chip for his machine but he was told that his machine is over 77 years old and it does not have a chip. They advised the pt that he needs a new cpap. So order placed for new cpap.Carron Curie, CMA

## 2012-11-27 ENCOUNTER — Telehealth: Payer: Self-pay | Admitting: Internal Medicine

## 2012-11-27 LAB — REMOTE PACEMAKER DEVICE
BMOD-0005RV: 95 {beats}/min
RV LEAD IMPEDENCE PM: 483 Ohm
RV LEAD THRESHOLD: 0.75 V
VENTRICULAR PACING PM: 97

## 2012-11-27 NOTE — Telephone Encounter (Signed)
Per 5.23.14 phone note, order for new CPAP was sent and faxed to [facility]: Comments    Pt needs new CPAP machine at same setting. Pt [place] machine if over 77 years old. DME-[facility]   Called [facility], spoke with Shanda Bumps who verified receipt of the above order this morning LMOM TCB x1 for pt to discuss this with him and see if anything further is needed  Last ov w/ CY 11.12.13, pending 11.17.14

## 2012-11-27 NOTE — Telephone Encounter (Signed)
Called, spoke with pt.   Pt is aware [facility] has received the cpap order, and he has spoken with [facility]. Pt reports [facility] is now contacting his insurance company to try to get this arranged. Pt aware to call office back if anything further is needed. He was very appreciative of help and voiced no further questions or concerns at this time.

## 2012-11-27 NOTE — Telephone Encounter (Signed)
Pt aware order sent to [facility].[name]

## 2012-12-07 ENCOUNTER — Encounter: Payer: Self-pay | Admitting: *Deleted

## 2012-12-10 ENCOUNTER — Ambulatory Visit (INDEPENDENT_AMBULATORY_CARE_PROVIDER_SITE_OTHER): Payer: Medicare Other | Admitting: *Deleted

## 2012-12-10 DIAGNOSIS — I4891 Unspecified atrial fibrillation: Secondary | ICD-10-CM

## 2012-12-10 DIAGNOSIS — Z7901 Long term (current) use of anticoagulants: Secondary | ICD-10-CM

## 2012-12-10 LAB — POCT INR: INR: 2.5

## 2013-01-22 ENCOUNTER — Ambulatory Visit (INDEPENDENT_AMBULATORY_CARE_PROVIDER_SITE_OTHER): Payer: Medicare Other | Admitting: *Deleted

## 2013-01-22 DIAGNOSIS — I4891 Unspecified atrial fibrillation: Secondary | ICD-10-CM

## 2013-01-22 DIAGNOSIS — Z7901 Long term (current) use of anticoagulants: Secondary | ICD-10-CM

## 2013-01-22 LAB — POCT INR: INR: 2

## 2013-01-25 ENCOUNTER — Other Ambulatory Visit: Payer: Self-pay | Admitting: *Deleted

## 2013-01-25 MED ORDER — FINASTERIDE 5 MG PO TABS: 5.0000 mg | ORAL_TABLET | Freq: Every day | ORAL | Status: DC

## 2013-01-25 NOTE — Telephone Encounter (Signed)
S/w pt wanted omeprazole, clonazepam, and finasteride refilled i explained our office has only prescribed the finasteride and i will fill that today, pt has to call Dr. Roxy Cedar office for the other medications pt verbally understood

## 2013-02-04 ENCOUNTER — Telehealth: Payer: Self-pay | Admitting: Internal Medicine

## 2013-02-04 MED ORDER — CLONAZEPAM 2 MG PO TABS: 2.0000 mg | ORAL_TABLET | Freq: Every day | ORAL | Status: DC

## 2013-02-04 NOTE — Telephone Encounter (Signed)
Pt is requesting refill on clonazepam. Last refilled 11/12/12 #90 x 0 refills.  Last OV 05/15/12 Pending 05/20/13 Please advise Dr. Maple Hudson if okay to refill and if so how many refills thanks

## 2013-02-04 NOTE — Telephone Encounter (Signed)
RX printed, Dr. Maple Hudson has signed this and I have faxed back. I called and made spouse aware. She stated this happens every time pt needs a refill. She stated pt has called [place] several times for this refill. I advised her in pt chart it is last showing he called in may and left message for [place]. Since no phone note is documented he has called. Spouse then try to say she is sure her husband would not lie about this and doesn't believe [employer] would lie about how many times they have contacted [place]. She wanted to know our process for when messages are taken. I advised her they leave a message with a receptionists who then sends it to triage. Depending on the matter we send the message to the physician to get the okay or recs. Then it gets sent back to the nurse. She stated this was a whole lot to do and things could get lost like this. I advised her we keep track of everything we do so it does not "get lost" as she stated. Nothing further was needed and will sign off message

## 2013-02-04 NOTE — Telephone Encounter (Signed)
Per CY-ok to refill x 5.

## 2013-02-05 ENCOUNTER — Other Ambulatory Visit: Payer: Self-pay | Admitting: *Deleted

## 2013-02-05 MED ORDER — WARFARIN SODIUM 5 MG PO TABS: ORAL_TABLET | ORAL | Status: DC

## 2013-02-18 ENCOUNTER — Ambulatory Visit (INDEPENDENT_AMBULATORY_CARE_PROVIDER_SITE_OTHER): Payer: Medicare Other | Admitting: *Deleted

## 2013-02-18 DIAGNOSIS — Z950 Presence of cardiac pacemaker: Secondary | ICD-10-CM

## 2013-02-18 DIAGNOSIS — I442 Atrioventricular block, complete: Secondary | ICD-10-CM

## 2013-03-01 LAB — REMOTE PACEMAKER DEVICE
BMOD-0005RV: 95 {beats}/min
BRDY-0002RV: 60 {beats}/min
BRDY-0004RV: 120 {beats}/min
RV LEAD THRESHOLD: 0.875 V
VENTRICULAR PACING PM: 97

## 2013-03-05 ENCOUNTER — Ambulatory Visit (INDEPENDENT_AMBULATORY_CARE_PROVIDER_SITE_OTHER): Payer: Medicare Other | Admitting: *Deleted

## 2013-03-05 DIAGNOSIS — I4891 Unspecified atrial fibrillation: Secondary | ICD-10-CM

## 2013-03-05 DIAGNOSIS — Z7901 Long term (current) use of anticoagulants: Secondary | ICD-10-CM

## 2013-03-20 ENCOUNTER — Other Ambulatory Visit: Payer: Self-pay

## 2013-03-20 MED ORDER — FINASTERIDE 5 MG PO TABS: 5.0000 mg | ORAL_TABLET | Freq: Every day | ORAL | Status: DC

## 2013-04-03 ENCOUNTER — Other Ambulatory Visit: Payer: Self-pay

## 2013-04-03 MED ORDER — FINASTERIDE 5 MG PO TABS: 5.0000 mg | ORAL_TABLET | Freq: Every day | ORAL | Status: DC

## 2013-04-08 ENCOUNTER — Encounter: Payer: Self-pay | Admitting: Internal Medicine

## 2013-04-15 ENCOUNTER — Ambulatory Visit (INDEPENDENT_AMBULATORY_CARE_PROVIDER_SITE_OTHER): Payer: Medicare Other | Admitting: *Deleted

## 2013-04-15 DIAGNOSIS — Z7901 Long term (current) use of anticoagulants: Secondary | ICD-10-CM

## 2013-04-15 DIAGNOSIS — I4891 Unspecified atrial fibrillation: Secondary | ICD-10-CM

## 2013-05-10 ENCOUNTER — Encounter: Payer: Medicare Other | Admitting: Internal Medicine

## 2013-05-13 ENCOUNTER — Encounter: Payer: Medicare Other | Admitting: Internal Medicine

## 2013-05-14 ENCOUNTER — Encounter: Payer: Self-pay | Admitting: Internal Medicine

## 2013-05-20 ENCOUNTER — Encounter: Payer: Self-pay | Admitting: Internal Medicine

## 2013-05-20 ENCOUNTER — Ambulatory Visit (INDEPENDENT_AMBULATORY_CARE_PROVIDER_SITE_OTHER): Payer: Medicare Other | Admitting: Internal Medicine

## 2013-05-20 VITALS — BP 112/66 | HR 63 | Ht 70.0 in | Wt 181.8 lb

## 2013-05-20 DIAGNOSIS — G2581 Restless legs syndrome: Secondary | ICD-10-CM

## 2013-05-20 DIAGNOSIS — G4733 Obstructive sleep apnea (adult) (pediatric): Secondary | ICD-10-CM

## 2013-05-20 NOTE — Progress Notes (Signed)
Subjective:    Patient ID: [name], male    DOB: 1930/05/29, 79 y.o.   MRN: [id number]  HPI 12/10/10- 80 yoM never smoker followed for OSA, hx Cheynes-Stokes, Restless legs, complicated by CM/ AFib/ brady-tachy/ pacer, allergic rhinitis, bronchitis, GERD. Hx right nephrectomy for cancer. Last here December 31, 2009 - note reviewed  CPAP continues to work fine, 7 cwp.  He likes his new machine.  Nortrpityline was given originally for depression and sleep. We discussed the possibility it was blurring his vision a little. He denies depression now.  He sleeps very well and is unaware of any limb movement disturbance now.   11/14/11- 81 yoM never smoker followed for OSA, hx Cheynes-Stokes, Restless legs, complicated by CM/ AFib/ brady-tachy/ pacer, allergic rhinitis, bronchitis, GERD. Hx right nephrectomy for cancer. Wears CPAP every night; States having Increased SOB as well-dizzy feelings as well; had pacemaker checked recently at cardiology and everything was normal. Describes episodes of feeling weak and dizzy and several situations, not always with exertion. Does think exercise tolerance has declined some. Insomnia with CPAP despite clonazepam 2 mg at bedtime. Sleep latency 2 hours. He thinks the main problem is arthritis and physical discomforts. PFT: 11/04/2011 normal spirometry with high normal lung volumes. FEV1/FVC 0.73. Insignificant response to bronchodilator. DLCO 118%. Room air oximetry with exercise: Ambulated several laps around our office today: 98% at rest with pulse 76. He did not drop below 97% saturation on room air and pulse rose no higher than 75. CT chest 10/28/11- images reviewed with him and his wife: IMPRESSION:  1. The plain film abnormality likely corresponds to a calcified  granuloma within the left upper lobe. There are other tiny  nodules, some of which are calcified. Given the history of  transitional cell carcinoma, follow-up with chest CT at 6 months  should be  considered.  2. No acute process in the chest. Cardiomegaly and small volume  pericardial effusion. The effusion is new since 06/14/2011.  3. Small volume perihepatic ascites. Incompletely imaged but felt  to be similar to 06/14/2011. Question fluid overload.  Original Report Authenticated By: Consuello Bossier, M.D.   05/15/12- 45 yoM never smoker followed for OSA, hx Cheynes-Stokes, Restless legs, complicated by CM/ AFib/ brady-tachy/ pacer, allergic rhinitis, bronchitis/ multiple granulomas, GERD. Hx right nephrectomy for cancer. Wears CPAP 7/ [facility] every night and presssure doing well; review with patient. Found out he needed new pacemaker-cause of SOB. Breathing is much better now. 05/15/12- 98%, 98%, 100%, 435 m. No discomfort. Good distance with no oxygen limit.  05/20/13- 81 yoM never smoker followed for OSA, hx Cheynes-Stokes, Restless legs, complicated by CM/ AFib/ brady-tachy/ pacer, allergic rhinitis, bronchitis/ multiple granulomas, GERD. Hx right nephrectomy for cancer. FOLLOWS FOR: wearing CPAP every night for about 7 hours; pressure working well for patient. Would like to discuss changing DME([facility] is [facility]) New CPAP machine 7/ [facility]. Sleeps soundly. Restless legs are controlled.  ROS-see HPI Constitutional:   No-   weight loss, night sweats, fevers, chills, fatigue, lassitude. HEENT:   No-  headaches, difficulty swallowing, tooth/dental problems, sore throat,       No-  sneezing, itching, ear ache, nasal congestion, post nasal drip,  CV:  No-   chest pain, orthopnea, PND, swelling in lower extremities, anasarca, +dizziness, palpitations Resp: Little shortness of breath with exertion or at rest.              No-   productive cough,  No non-productive cough,  No- coughing up of blood.              No-   change in color of mucus.  No- wheezing.   Skin: No-   rash or lesions. GI:  No-   heartburn, indigestion, abdominal pain, nausea, vomiting, GU:  MS:  No-   joint  pain or swelling.   Neuro-     nothing unusual Psych:  No- change in mood or affect. No depression or anxiety.  No memory loss.  OBJ- Physical Exam BP 112/66  Pulse 63  Ht 5\' 10"  (1.778 m)  Wt 181 lb 12.8 oz (82.464 kg)  BMI 26.09 kg/m2  SpO2 99%  General- Alert, Oriented, Affect-appropriate, Distress- none acute. Trim, fit appearing.  Skin- rash-none, lesions- none, excoriation- none Lymphadenopathy- none Head- atraumatic            Eyes- Gross vision intact, PERRLA, conjunctivae and secretions clear            Ears- Hearing, canals-normal            Nose- Clear, no-Septal dev, mucus, polyps, erosion, perforation             Throat- Mallampati II , mucosa clear , drainage- none, tonsils- atrophic Neck- flexible , trachea midline, no stridor , thyroid nl, carotid no bruit Chest - symmetrical excursion , unlabored           Heart/CV- RRR , 2/6 SEM AS murmur , no gallop  , no rub, nl s1 s2                           - JVD- none , edema- none, stasis changes- none, varices- none           Lung- clear to P&A, no rales, unlabored, wheeze- none, cough- none , dullness-none, rub- none           Chest wall- R pacemaker Abd-  Br/ Gen/ Rectal- Not done, not indicated Extrem- cyanosis- none, clubbing, none, atrophy- none, strength- nl Neuro- grossly intact to observation  Assessment & Plan:

## 2013-05-20 NOTE — Patient Instructions (Signed)
Order- [facility]  [name] would like to change DME from [facility]   CPAP 7, mask of choice, humidifier, supplies,    Dx OSA   Please call as needed

## 2013-05-28 ENCOUNTER — Telehealth: Payer: Self-pay | Admitting: Internal Medicine

## 2013-05-29 ENCOUNTER — Ambulatory Visit (INDEPENDENT_AMBULATORY_CARE_PROVIDER_SITE_OTHER): Payer: Medicare Other | Admitting: *Deleted

## 2013-05-29 DIAGNOSIS — I4891 Unspecified atrial fibrillation: Secondary | ICD-10-CM

## 2013-05-29 DIAGNOSIS — Z7901 Long term (current) use of anticoagulants: Secondary | ICD-10-CM

## 2013-05-29 LAB — POCT INR: INR: 2.2

## 2013-06-03 ENCOUNTER — Ambulatory Visit (INDEPENDENT_AMBULATORY_CARE_PROVIDER_SITE_OTHER): Payer: Medicare Other | Admitting: Internal Medicine

## 2013-06-03 ENCOUNTER — Encounter: Payer: Self-pay | Admitting: Internal Medicine

## 2013-06-03 VITALS — BP 125/79 | HR 69 | Ht 70.0 in | Wt 176.4 lb

## 2013-06-03 DIAGNOSIS — Z950 Presence of cardiac pacemaker: Secondary | ICD-10-CM

## 2013-06-03 DIAGNOSIS — I4949 Other premature depolarization: Secondary | ICD-10-CM

## 2013-06-03 DIAGNOSIS — I442 Atrioventricular block, complete: Secondary | ICD-10-CM

## 2013-06-03 DIAGNOSIS — I493 Ventricular premature depolarization: Secondary | ICD-10-CM | POA: Insufficient documentation

## 2013-06-03 DIAGNOSIS — I4891 Unspecified atrial fibrillation: Secondary | ICD-10-CM

## 2013-06-03 LAB — MDC_IDC_ENUM_SESS_TYPE_INCLINIC
Battery Remaining Longevity: 90 mo
Battery Voltage: 2.76 V
Brady Statistic RV Percent Paced: 97.4 %
Lead Channel Impedance Value: 461 Ohm

## 2013-06-03 NOTE — Patient Instructions (Signed)
Your physician wants you to follow-up in: 12 months with Dr Jacquiline Doe will receive a reminder letter in the mail two months in advance. If you don't receive a letter, please call our office to schedule the follow-up appointment.   Remote monitoring is used to monitor your Pacemaker of ICD from home. This monitoring reduces the number of office visits required to check your device to one time per year. It allows [place] to keep an eye on the functioning of your device to ensure it is working properly. You are scheduled for a device check from home on 09/03/13. You may send your transmission at any time that day. If you have a wireless device, the transmission will be sent automatically. After your physician reviews your transmission, you will receive a postcard with your next transmission date.

## 2013-06-03 NOTE — Progress Notes (Signed)
PCP: Lillia Mountain, MD  [name] is a 79 y.o. male who presents today for routine electrophysiology followup.  Since his last visit to our office, the patient reports doing very well.  Today, he denies symptoms of palpitations, chest pain, shortness of breath,  lower extremity edema, dizziness, presyncope, or syncope.  The patient is otherwise without complaint today.   Past Medical History  Diagnosis Date  . Allergic rhinitis   . RLS (restless legs syndrome)   . Permanent atrial fibrillation     on coumadin  . Esophageal reflux   . Bronchitis   . OSA (obstructive sleep apnea)   . Normal nuclear stress test 2013  . Transitional cell carcinoma     s/p nephrectomy in  2010  . Complete heart block     pacemaker originally placed in 1988  . BPH (benign prostatic hyperplasia)   . Lung nodule     noted on CXR December 2012  . Hypertension   . Dementia   . Pneumonia   . Pacemaker   . Renal insufficiency     Rt kidney removed d/t maglinant tumor  . Arthritis    Past Surgical History  Procedure Laterality Date  . Pacemaker insertion  1988    most recent generator change by Dr Johney Frame 20067/2/13 with new right ventricular lead placed  . Nephrectomy  2010    rt  . Hernia repair      multiple  . Repair ankle ligament    . US echocardiography  04/22/2008    EF 55-60%  . Doppler echocardiography  03/26/2001    EF 55%  . Cardiovascular stress test  03/23/2009    EF 65%, NORMAL  . Eye surgery      tumor removed from Rt eye lid  . Mass excision Left 11/07/2012    Procedure: MINOR EXCISION OF MASS;  Surgeon: Currie Paris, MD;  Location: [facility];  Service: General;  Laterality: Left;    [facility] Prescriptions  Medication Sig Dispense Refill  . aspirin 81 MG tablet Take 81 mg by mouth daily.        Marland Kitchen atenolol (TENORMIN) 25 MG tablet Take 1 tablet (25 mg total) by mouth daily.  90 tablet  4  . clonazePAM (KLONOPIN) 2 MG tablet Take 1 tablet  (2 mg total) by mouth at bedtime.  90 tablet  1  . cycloSPORINE (RESTASIS) 0.05 % ophthalmic emulsion Place 1 drop into both eyes 2 (two) times daily.       Marland Kitchen doxazosin (CARDURA) 4 MG tablet Take 5 mg by mouth daily.       . finasteride (PROSCAR) 5 MG tablet Take 1 tablet (5 mg total) by mouth daily.  90 tablet  0  . furosemide (LASIX) 40 MG tablet Take 40 mg by mouth daily.       Marland Kitchen galantamine (RAZADYNE) 8 MG tablet Take 8 mg by mouth 2 (two) times daily.      . hydroxypropyl methylcellulose (ISOPTO TEARS) 2.5 % ophthalmic solution Place 1 drop into both eyes at bedtime.       Marland Kitchen ipratropium (ATROVENT) 0.06 % nasal spray Place 2 sprays into the nose 3 (three) times daily as needed for rhinitis.  127 mL  3  . Multiple Vitamin (MULTIVITAMIN) capsule Take 1 capsule by mouth daily.        . nortriptyline (PAMELOR) 10 MG capsule Take 1 capsule (10 mg total) by mouth at bedtime.  90 capsule  1  . omeprazole (PRILOSEC) 20 MG capsule Take 20 mg by mouth daily.       . simvastatin (ZOCOR) 10 MG tablet Take 1 tablet (10 mg total) by mouth at bedtime.  90 tablet  2  . warfarin (COUMADIN) 5 MG tablet Take as directed per Anticoagulation Clinic  90 tablet  0   No [facility] facility-administered medications for this visit.    Physical Exam: Filed Vitals:   06/03/13 1043  BP: 125/79  Pulse: 69  Height: 5\' 10"  (1.778 m)  Weight: 176 lb 6.4 oz (80.015 kg)    GEN- The patient is well appearing, alert and oriented x 3 today.   Head- normocephalic, atraumatic Eyes-  Sclera clear, conjunctiva pink Ears- hearing intact Oropharynx- clear Lungs- Clear to ausculation bilaterally, normal work of breathing Chest- pacemaker pocket is well healed Heart- Regular rate and rhythm (paced) GI- soft, NT, ND, + BS Extremities- no clubbing, cyanosis, or edema  Pacemaker interrogation- reviewed in detail today,  See PACEART report  Assessment and Plan:  1. Complete heart block Normal pacemaker function See Pace  Art report No changes today  2. Permanent Afib Continue long term anticoagulation with coumadin He had prior TIA when only on coumadin.  He was seen by neurology who recommended both ASA and coumadin.  I will therefore make no changes to his regimen.  3. NSVT Stable No change required today Continue beta blockers long term  Carelink every 3 months Return in 1 year

## 2013-06-05 NOTE — Assessment & Plan Note (Addendum)
Good compliance and control. Likes new machine. He is asking to change to a different DME company

## 2013-06-05 NOTE — Assessment & Plan Note (Signed)
controlled 

## 2013-07-01 ENCOUNTER — Telehealth: Payer: Self-pay | Admitting: Internal Medicine

## 2013-07-01 MED ORDER — NORTRIPTYLINE HCL 10 MG PO CAPS: 10.0000 mg | ORAL_CAPSULE | Freq: Every day | ORAL | Status: DC

## 2013-07-01 NOTE — Telephone Encounter (Signed)
Nortriptyline (PAMELOR) 10 MG capsule Take 1 tablet qhs x 4 refill. Requests to be refilled through [facility] 90-day supply. Refilled and pt aware.

## 2013-07-02 ENCOUNTER — Telehealth: Payer: Self-pay | Admitting: Internal Medicine

## 2013-07-02 DIAGNOSIS — I4891 Unspecified atrial fibrillation: Secondary | ICD-10-CM

## 2013-07-02 MED ORDER — NORTRIPTYLINE HCL 10 MG PO CAPS: 10.0000 mg | ORAL_CAPSULE | Freq: Every day | ORAL | Status: DC

## 2013-07-02 MED ORDER — IPRATROPIUM BROMIDE 0.06 % NA SOLN: 2.0000 | Freq: Three times a day (TID) | NASAL | Status: DC | PRN

## 2013-07-02 NOTE — Telephone Encounter (Signed)
Pt aware that we already sent Pamelor Rx to [facility] 07/01/13 Resent today d/t [facility] stating they did not receive. Pt also requested Atrovent NA sent to [facility] as well. Nothing further needed.

## 2013-07-05 ENCOUNTER — Other Ambulatory Visit: Payer: Self-pay

## 2013-07-05 MED ORDER — CLONAZEPAM 2 MG PO TABS: 2.0000 mg | ORAL_TABLET | Freq: Every day | ORAL | Status: DC

## 2013-07-08 ENCOUNTER — Ambulatory Visit (INDEPENDENT_AMBULATORY_CARE_PROVIDER_SITE_OTHER): Payer: Medicare Other

## 2013-07-08 DIAGNOSIS — I4891 Unspecified atrial fibrillation: Secondary | ICD-10-CM

## 2013-07-08 DIAGNOSIS — Z7901 Long term (current) use of anticoagulants: Secondary | ICD-10-CM

## 2013-07-08 LAB — POCT INR: INR: 3.2

## 2013-07-12 ENCOUNTER — Other Ambulatory Visit: Payer: Self-pay | Admitting: *Deleted

## 2013-07-12 ENCOUNTER — Other Ambulatory Visit: Payer: Self-pay

## 2013-07-12 MED ORDER — SIMVASTATIN 10 MG PO TABS: 10.0000 mg | ORAL_TABLET | Freq: Every day | ORAL | Status: DC

## 2013-07-18 ENCOUNTER — Other Ambulatory Visit: Payer: Self-pay | Admitting: Pharmacist

## 2013-07-18 MED ORDER — WARFARIN SODIUM 5 MG PO TABS: ORAL_TABLET | ORAL | Status: DC

## 2013-07-19 ENCOUNTER — Other Ambulatory Visit: Payer: Self-pay | Admitting: *Deleted

## 2013-07-19 MED ORDER — SIMVASTATIN 10 MG PO TABS: 10.0000 mg | ORAL_TABLET | Freq: Every day | ORAL | Status: DC

## 2013-07-23 ENCOUNTER — Other Ambulatory Visit: Payer: Self-pay | Admitting: *Deleted

## 2013-07-23 MED ORDER — WARFARIN SODIUM 5 MG PO TABS: ORAL_TABLET | ORAL | Status: DC

## 2013-08-05 ENCOUNTER — Other Ambulatory Visit: Payer: Self-pay | Admitting: Internal Medicine

## 2013-08-05 NOTE — Telephone Encounter (Signed)
Spoke with patient-aware I will send message to CY to approve or deny refill. Will call patient back once completed.   CY please advise. Thanks.

## 2013-08-05 NOTE — Telephone Encounter (Signed)
Per CY ok to refill clonazepam for 90 day supply.  Rx printed. Please fax to [facility] at [phone] once signed. Thanks. Garrison Bing, CMA

## 2013-08-07 MED ORDER — CLONAZEPAM 2 MG PO TABS: 2.0000 mg | ORAL_TABLET | Freq: Every day | ORAL | Status: DC

## 2013-08-07 NOTE — Telephone Encounter (Signed)
Has this been done Katie? [place] Bing, CMA

## 2013-08-07 NOTE — Telephone Encounter (Signed)
I have not received any rx's from CY to fax anywhere; unsure if someone else may have faxed it back.

## 2013-08-07 NOTE — Telephone Encounter (Signed)
The prescription had not even been printed for CY to sign. This has been done and it has been signed. It will be faxed to [facility].

## 2013-08-12 ENCOUNTER — Ambulatory Visit (INDEPENDENT_AMBULATORY_CARE_PROVIDER_SITE_OTHER): Payer: Medicare Other | Admitting: *Deleted

## 2013-08-12 DIAGNOSIS — Z7901 Long term (current) use of anticoagulants: Secondary | ICD-10-CM

## 2013-08-12 DIAGNOSIS — I4891 Unspecified atrial fibrillation: Secondary | ICD-10-CM

## 2013-08-12 LAB — POCT INR: INR: 2.8

## 2013-09-03 ENCOUNTER — Ambulatory Visit (INDEPENDENT_AMBULATORY_CARE_PROVIDER_SITE_OTHER): Payer: Medicare Other | Admitting: *Deleted

## 2013-09-03 DIAGNOSIS — I442 Atrioventricular block, complete: Secondary | ICD-10-CM

## 2013-09-03 DIAGNOSIS — I4891 Unspecified atrial fibrillation: Secondary | ICD-10-CM

## 2013-09-03 DIAGNOSIS — I495 Sick sinus syndrome: Secondary | ICD-10-CM

## 2013-09-08 LAB — MDC_IDC_ENUM_SESS_TYPE_REMOTE
Battery Impedance: 284 Ohm
Battery Voltage: 2.76 V
Date Time Interrogation Session: 20150304042508
Lead Channel Pacing Threshold Pulse Width: 0.4 ms
Lead Channel Setting Pacing Amplitude: 2.5 V
MDC IDC MSMT BATTERY REMAINING LONGEVITY: 91 mo
MDC IDC MSMT LEADCHNL RA IMPEDANCE VALUE: 0 Ohm
MDC IDC MSMT LEADCHNL RV IMPEDANCE VALUE: 454 Ohm
MDC IDC MSMT LEADCHNL RV PACING THRESHOLD AMPLITUDE: 0.865 V
MDC IDC SET LEADCHNL RV PACING PULSEWIDTH: 0.4 ms
MDC IDC SET LEADCHNL RV SENSING SENSITIVITY: 4 mV
MDC IDC STAT BRADY RV PERCENT PACED: 98 %

## 2013-09-12 ENCOUNTER — Other Ambulatory Visit: Payer: Self-pay

## 2013-09-12 MED ORDER — FINASTERIDE 5 MG PO TABS: 5.0000 mg | ORAL_TABLET | Freq: Every day | ORAL | Status: DC

## 2013-09-17 ENCOUNTER — Other Ambulatory Visit: Payer: Self-pay

## 2013-09-17 MED ORDER — ATENOLOL 25 MG PO TABS: 25.0000 mg | ORAL_TABLET | Freq: Every day | ORAL | Status: DC

## 2013-09-23 ENCOUNTER — Ambulatory Visit (INDEPENDENT_AMBULATORY_CARE_PROVIDER_SITE_OTHER): Payer: Medicare Other | Admitting: *Deleted

## 2013-09-23 DIAGNOSIS — Z5181 Encounter for therapeutic drug level monitoring: Secondary | ICD-10-CM | POA: Insufficient documentation

## 2013-09-23 DIAGNOSIS — I4891 Unspecified atrial fibrillation: Secondary | ICD-10-CM

## 2013-09-23 DIAGNOSIS — Z7901 Long term (current) use of anticoagulants: Secondary | ICD-10-CM

## 2013-09-23 LAB — POCT INR: INR: 2.4

## 2013-10-02 ENCOUNTER — Encounter: Payer: Self-pay | Admitting: *Deleted

## 2013-10-11 ENCOUNTER — Encounter: Payer: Self-pay | Admitting: Internal Medicine

## 2013-10-29 ENCOUNTER — Telehealth: Payer: Self-pay | Admitting: Internal Medicine

## 2013-10-29 MED ORDER — CLONAZEPAM 2 MG PO TABS: 2.0000 mg | ORAL_TABLET | Freq: Every day | ORAL | Status: DC

## 2013-10-29 NOTE — Telephone Encounter (Signed)
Ok to refill 

## 2013-10-29 NOTE — Telephone Encounter (Signed)
Rx has been printed and signed by CY. Rx has been faxed to [facility]. Pt is aware.

## 2013-10-29 NOTE — Telephone Encounter (Signed)
Last OV 05/20/13 Pending OV 05/26/14 Last fill 08/07/13 #90 with 0 additional refills  CY - please advise on refill. Thanks.

## 2013-12-02 DIAGNOSIS — I4891 Unspecified atrial fibrillation: Secondary | ICD-10-CM

## 2013-12-09 ENCOUNTER — Ambulatory Visit (INDEPENDENT_AMBULATORY_CARE_PROVIDER_SITE_OTHER): Payer: Medicare Other | Admitting: *Deleted

## 2013-12-09 DIAGNOSIS — I4891 Unspecified atrial fibrillation: Secondary | ICD-10-CM

## 2013-12-09 NOTE — Progress Notes (Signed)
Remote pacemaker transmission.   

## 2013-12-11 ENCOUNTER — Telehealth: Payer: Self-pay | Admitting: Internal Medicine

## 2013-12-11 NOTE — Telephone Encounter (Signed)
Nortriptyline was given originally for depression and sleep. Ok to try gradually tapering it off over a week or two. See how he does without it.

## 2013-12-11 NOTE — Telephone Encounter (Signed)
Called and spoke with pt and he stated that he was seen by his neuropsychologist yesterday and he feels that the nortriptyline is interfering with his thinking process and causing confusion and the pt wanted to see if CY thinks that the nortriptyline could be changed to a different medication.  Pt is aware that CY is out of the office this afternoon but will return in the morning.  Pt is ok with a call back then.  CY please advise. Thanks  Last ov--05/20/2014 Next ov--05/26/2014  No Known Allergies   [facility] Prescriptions on File Prior to Visit  Medication Sig Dispense Refill  . aspirin 81 MG tablet Take 81 mg by mouth daily.        Marland Kitchen atenolol (TENORMIN) 25 MG tablet Take 1 tablet (25 mg total) by mouth daily.  90 tablet  2  . clonazePAM (KLONOPIN) 2 MG tablet Take 1 tablet (2 mg total) by mouth at bedtime.  90 tablet  0  . cycloSPORINE (RESTASIS) 0.05 % ophthalmic emulsion Place 1 drop into both eyes 2 (two) times daily.       Marland Kitchen doxazosin (CARDURA) 4 MG tablet Take 5 mg by mouth daily.       . finasteride (PROSCAR) 5 MG tablet Take 1 tablet (5 mg total) by mouth daily.  90 tablet  3  . furosemide (LASIX) 40 MG tablet Take 40 mg by mouth daily.       Marland Kitchen galantamine (RAZADYNE) 8 MG tablet Take 8 mg by mouth 2 (two) times daily.      . hydroxypropyl methylcellulose (ISOPTO TEARS) 2.5 % ophthalmic solution Place 1 drop into both eyes at bedtime.       Marland Kitchen ipratropium (ATROVENT) 0.06 % nasal spray Place 2 sprays into the nose 3 (three) times daily as needed for rhinitis.  127 mL  1  . Multiple Vitamin (MULTIVITAMIN) capsule Take 1 capsule by mouth daily.        . nortriptyline (PAMELOR) 10 MG capsule Take 1 capsule (10 mg total) by mouth at bedtime.  90 capsule  1  . omeprazole (PRILOSEC) 20 MG capsule Take 20 mg by mouth daily.       . simvastatin (ZOCOR) 10 MG tablet Take 1 tablet (10 mg total) by mouth at bedtime.  7 tablet  0  . warfarin (COUMADIN) 5 MG tablet Take as directed per  Anticoagulation Clinic  90 tablet  1   No [facility] facility-administered medications on file prior to visit.

## 2013-12-11 NOTE — Telephone Encounter (Signed)
Called and spoke with pt and he is aware of CY recs and will try to decrease the medication over the next couple of weeks.  Pt is aware to call for any concerns.

## 2013-12-13 LAB — MDC_IDC_ENUM_SESS_TYPE_REMOTE
Battery Remaining Longevity: 88 mo
Brady Statistic RV Percent Paced: 98 %
Date Time Interrogation Session: 20150601222008
Lead Channel Impedance Value: 0 Ohm
Lead Channel Impedance Value: 446 Ohm
Lead Channel Pacing Threshold Amplitude: 1 V
Lead Channel Setting Pacing Amplitude: 2.5 V
Lead Channel Setting Pacing Pulse Width: 0.4 ms
Lead Channel Setting Sensing Sensitivity: 4 mV
MDC IDC MSMT BATTERY IMPEDANCE: 309 Ohm
MDC IDC MSMT BATTERY VOLTAGE: 2.76 V
MDC IDC MSMT LEADCHNL RV PACING THRESHOLD PULSEWIDTH: 0.4 ms

## 2013-12-23 ENCOUNTER — Ambulatory Visit (INDEPENDENT_AMBULATORY_CARE_PROVIDER_SITE_OTHER): Payer: Medicare Other

## 2013-12-23 DIAGNOSIS — Z5181 Encounter for therapeutic drug level monitoring: Secondary | ICD-10-CM

## 2013-12-23 DIAGNOSIS — I4891 Unspecified atrial fibrillation: Secondary | ICD-10-CM

## 2013-12-23 DIAGNOSIS — Z7901 Long term (current) use of anticoagulants: Secondary | ICD-10-CM

## 2013-12-23 LAB — POCT INR: INR: 2.4

## 2013-12-26 ENCOUNTER — Telehealth: Payer: Self-pay | Admitting: Internal Medicine

## 2013-12-26 NOTE — Telephone Encounter (Signed)
If not depressed, then better we don't risk side effects by taking any medicine we don't need.

## 2013-12-26 NOTE — Telephone Encounter (Signed)
Spoke with the pt and notified of recs per CDY  He verbalized understanding  Nothing further needed 

## 2013-12-26 NOTE — Telephone Encounter (Signed)
Spoke with the pt  He states calling to give an update to CDY  He stopped the notriptylline per CDY's request due to concerns from his neuropsychologist  He thought that it was affecting the pt's thought process  Pt states that since stopping med he can not tell any difference  He is asking if CDY has an alternate med in mind for depression, but states "I'm not depressed at this time though" Please advise, thanks! No Known Allergies [facility] Prescriptions on File Prior to Visit  Medication Sig Dispense Refill  . aspirin 81 MG tablet Take 81 mg by mouth daily.        Marland Kitchen atenolol (TENORMIN) 25 MG tablet Take 1 tablet (25 mg total) by mouth daily.  90 tablet  2  . clonazePAM (KLONOPIN) 2 MG tablet Take 1 tablet (2 mg total) by mouth at bedtime.  90 tablet  0  . cycloSPORINE (RESTASIS) 0.05 % ophthalmic emulsion Place 1 drop into both eyes 2 (two) times daily.       Marland Kitchen doxazosin (CARDURA) 4 MG tablet Take 5 mg by mouth daily.       . finasteride (PROSCAR) 5 MG tablet Take 1 tablet (5 mg total) by mouth daily.  90 tablet  3  . furosemide (LASIX) 40 MG tablet Take 40 mg by mouth daily.       Marland Kitchen galantamine (RAZADYNE) 8 MG tablet Take 8 mg by mouth 2 (two) times daily.      . hydroxypropyl methylcellulose (ISOPTO TEARS) 2.5 % ophthalmic solution Place 1 drop into both eyes at bedtime.       Marland Kitchen ipratropium (ATROVENT) 0.06 % nasal spray Place 2 sprays into the nose 3 (three) times daily as needed for rhinitis.  127 mL  1  . Multiple Vitamin (MULTIVITAMIN) capsule Take 1 capsule by mouth daily.        Marland Kitchen omeprazole (PRILOSEC) 20 MG capsule Take 20 mg by mouth daily.       . simvastatin (ZOCOR) 10 MG tablet Take 1 tablet (10 mg total) by mouth at bedtime.  7 tablet  0  . warfarin (COUMADIN) 5 MG tablet Take as directed per Anticoagulation Clinic  90 tablet  1   No [facility] facility-administered medications on file prior to visit.

## 2013-12-31 ENCOUNTER — Encounter: Payer: Self-pay | Admitting: Cardiology

## 2014-01-10 NOTE — Telephone Encounter (Signed)
Closed encounter °

## 2014-01-13 ENCOUNTER — Other Ambulatory Visit: Payer: Self-pay | Admitting: *Deleted

## 2014-01-13 MED ORDER — SIMVASTATIN 10 MG PO TABS: 10.0000 mg | ORAL_TABLET | Freq: Every day | ORAL | Status: DC

## 2014-01-14 ENCOUNTER — Telehealth: Payer: Self-pay | Admitting: Internal Medicine

## 2014-01-14 NOTE — Telephone Encounter (Signed)
Called spoke with pt. He reports CDY rx'd him nortrpylin in the past. He saw a psychologists and was advised this could have been causing "webs" in his memory. He wants an alternative for depression. Please advise Dr. Annamaria Boots thanks  No Known Allergies   [facility] Prescriptions on File Prior to Visit  Medication Sig Dispense Refill  . aspirin 81 MG tablet Take 81 mg by mouth daily.        Marland Kitchen atenolol (TENORMIN) 25 MG tablet Take 1 tablet (25 mg total) by mouth daily.  90 tablet  2  . clonazePAM (KLONOPIN) 2 MG tablet Take 1 tablet (2 mg total) by mouth at bedtime.  90 tablet  0  . cycloSPORINE (RESTASIS) 0.05 % ophthalmic emulsion Place 1 drop into both eyes 2 (two) times daily.       Marland Kitchen doxazosin (CARDURA) 4 MG tablet Take 5 mg by mouth daily.       . finasteride (PROSCAR) 5 MG tablet Take 1 tablet (5 mg total) by mouth daily.  90 tablet  3  . furosemide (LASIX) 40 MG tablet Take 40 mg by mouth daily.       Marland Kitchen galantamine (RAZADYNE) 8 MG tablet Take 8 mg by mouth 2 (two) times daily.      . hydroxypropyl methylcellulose (ISOPTO TEARS) 2.5 % ophthalmic solution Place 1 drop into both eyes at bedtime.       Marland Kitchen ipratropium (ATROVENT) 0.06 % nasal spray Place 2 sprays into the nose 3 (three) times daily as needed for rhinitis.  127 mL  1  . Multiple Vitamin (MULTIVITAMIN) capsule Take 1 capsule by mouth daily.        Marland Kitchen omeprazole (PRILOSEC) 20 MG capsule Take 20 mg by mouth daily.       . simvastatin (ZOCOR) 10 MG tablet Take 1 tablet (10 mg total) by mouth at bedtime.  90 tablet  0  . warfarin (COUMADIN) 5 MG tablet Take as directed per Anticoagulation Clinic  90 tablet  1   No [facility] facility-administered medications on file prior to visit.

## 2014-01-14 NOTE — Telephone Encounter (Signed)
I called made pt aware of below. Nothing further needed 

## 2014-01-14 NOTE — Telephone Encounter (Signed)
This is beyond my scope of practice. He can ask Neurology or his PCP to manage or refer to a psychiatrist for management of depression.

## 2014-02-03 ENCOUNTER — Ambulatory Visit (INDEPENDENT_AMBULATORY_CARE_PROVIDER_SITE_OTHER): Payer: Medicare Other

## 2014-02-03 DIAGNOSIS — I4891 Unspecified atrial fibrillation: Secondary | ICD-10-CM

## 2014-02-03 DIAGNOSIS — Z7901 Long term (current) use of anticoagulants: Secondary | ICD-10-CM

## 2014-02-03 DIAGNOSIS — Z5181 Encounter for therapeutic drug level monitoring: Secondary | ICD-10-CM

## 2014-02-03 LAB — POCT INR: INR: 2.7

## 2014-02-04 ENCOUNTER — Telehealth: Payer: Self-pay | Admitting: Internal Medicine

## 2014-02-04 NOTE — Telephone Encounter (Signed)
Called and spoke with pt and he stated that he has plenty of the clonzepam but the pharmacy stated that they do not have any further refills for him and asked the pt to call us to send refills to them.  Pt is aware that CY will be back on Monday and he is ok to wait until that time.  CY please advise if ok to send refills for the clonazepam to the mail order pharmacy.  Thanks  Last ov--05/2013 Next ov--05/2014  No Known Allergies  [facility] Prescriptions on File Prior to Visit  Medication Sig Dispense Refill  . aspirin 81 MG tablet Take 81 mg by mouth daily.        Marland Kitchen atenolol (TENORMIN) 25 MG tablet Take 1 tablet (25 mg total) by mouth daily.  90 tablet  2  . clonazePAM (KLONOPIN) 2 MG tablet Take 1 tablet (2 mg total) by mouth at bedtime.  90 tablet  0  . cycloSPORINE (RESTASIS) 0.05 % ophthalmic emulsion Place 1 drop into both eyes 2 (two) times daily.       Marland Kitchen doxazosin (CARDURA) 4 MG tablet Take 5 mg by mouth daily.       . finasteride (PROSCAR) 5 MG tablet Take 1 tablet (5 mg total) by mouth daily.  90 tablet  3  . furosemide (LASIX) 40 MG tablet Take 40 mg by mouth daily.       Marland Kitchen galantamine (RAZADYNE) 8 MG tablet Take 8 mg by mouth 2 (two) times daily.      . hydroxypropyl methylcellulose (ISOPTO TEARS) 2.5 % ophthalmic solution Place 1 drop into both eyes at bedtime.       Marland Kitchen ipratropium (ATROVENT) 0.06 % nasal spray Place 2 sprays into the nose 3 (three) times daily as needed for rhinitis.  127 mL  1  . Multiple Vitamin (MULTIVITAMIN) capsule Take 1 capsule by mouth daily.        Marland Kitchen omeprazole (PRILOSEC) 20 MG capsule Take 20 mg by mouth daily.       . simvastatin (ZOCOR) 10 MG tablet Take 1 tablet (10 mg total) by mouth at bedtime.  90 tablet  0  . warfarin (COUMADIN) 5 MG tablet Take as directed per Anticoagulation Clinic  90 tablet  1   No [facility] facility-administered medications on file prior to visit.

## 2014-02-04 NOTE — Telephone Encounter (Signed)
Ok to refill 

## 2014-02-05 MED ORDER — CLONAZEPAM 2 MG PO TABS: 2.0000 mg | ORAL_TABLET | Freq: Every day | ORAL | Status: DC

## 2014-02-05 NOTE — Telephone Encounter (Signed)
RX has been faxed to [facility].  Pt aware. Nothing further needed

## 2014-02-14 ENCOUNTER — Telehealth: Payer: Self-pay | Admitting: Internal Medicine

## 2014-02-14 DIAGNOSIS — G4733 Obstructive sleep apnea (adult) (pediatric): Secondary | ICD-10-CM

## 2014-02-14 NOTE — Telephone Encounter (Signed)
Called spoke with pt. He is needing a new nasal pillow mask. He is also wanting to change from [facility] to [facility] bc he is not happy with services from [facility].  i have placed order. Nothing further needed

## 2014-03-04 ENCOUNTER — Encounter: Payer: Self-pay | Admitting: Internal Medicine

## 2014-03-12 ENCOUNTER — Telehealth: Payer: Self-pay | Admitting: Cardiology

## 2014-03-12 ENCOUNTER — Encounter: Payer: Medicare Other | Admitting: *Deleted

## 2014-03-12 NOTE — Telephone Encounter (Signed)
Pt has tried to send remote transmission for 1 hour. Pt states that his phone company is [facility] and that he does not have phone filter. I informed pt that I would mail phone filter today and how to hook up filter. He verbalized understanding.

## 2014-03-12 NOTE — Telephone Encounter (Signed)
Confirmed remote transmission with pt wife.

## 2014-03-14 ENCOUNTER — Encounter: Payer: Self-pay | Admitting: Cardiology

## 2014-03-17 ENCOUNTER — Ambulatory Visit (INDEPENDENT_AMBULATORY_CARE_PROVIDER_SITE_OTHER): Payer: Medicare Other

## 2014-03-17 DIAGNOSIS — Z5181 Encounter for therapeutic drug level monitoring: Secondary | ICD-10-CM

## 2014-03-17 DIAGNOSIS — I4891 Unspecified atrial fibrillation: Secondary | ICD-10-CM

## 2014-03-17 DIAGNOSIS — Z7901 Long term (current) use of anticoagulants: Secondary | ICD-10-CM

## 2014-03-17 LAB — POCT INR: INR: 1.9

## 2014-03-20 ENCOUNTER — Ambulatory Visit (INDEPENDENT_AMBULATORY_CARE_PROVIDER_SITE_OTHER): Payer: Medicare Other | Admitting: *Deleted

## 2014-03-20 DIAGNOSIS — I4891 Unspecified atrial fibrillation: Secondary | ICD-10-CM

## 2014-03-20 NOTE — Progress Notes (Signed)
Remote pacemaker transmission.   

## 2014-03-21 LAB — MDC_IDC_ENUM_SESS_TYPE_REMOTE
Battery Voltage: 2.76 V
Date Time Interrogation Session: 20150917135735
Lead Channel Impedance Value: 0 Ohm
Lead Channel Setting Pacing Amplitude: 2.5 V
Lead Channel Setting Pacing Pulse Width: 0.4 ms
Lead Channel Setting Sensing Sensitivity: 4 mV
MDC IDC MSMT BATTERY IMPEDANCE: 334 Ohm
MDC IDC MSMT BATTERY REMAINING LONGEVITY: 87 mo
MDC IDC MSMT LEADCHNL RV IMPEDANCE VALUE: 450 Ohm
MDC IDC STAT BRADY RV PERCENT PACED: 99 %

## 2014-03-25 ENCOUNTER — Encounter: Payer: Self-pay | Admitting: Cardiology

## 2014-03-27 ENCOUNTER — Encounter: Payer: Self-pay | Admitting: Internal Medicine

## 2014-04-03 ENCOUNTER — Other Ambulatory Visit: Payer: Self-pay | Admitting: *Deleted

## 2014-04-03 MED ORDER — CLONAZEPAM 2 MG PO TABS: 2.0000 mg | ORAL_TABLET | Freq: Every day | ORAL | Status: DC

## 2014-04-09 ENCOUNTER — Telehealth: Payer: Self-pay | Admitting: Internal Medicine

## 2014-04-09 MED ORDER — IPRATROPIUM BROMIDE 0.06 % NA SOLN: 2.0000 | Freq: Three times a day (TID) | NASAL | Status: DC | PRN

## 2014-04-09 NOTE — Telephone Encounter (Signed)
Called spoke with pt. He needs refill on nasal spray ipratropium called into [facility]. I have sent this in. Nothing further needed

## 2014-04-28 ENCOUNTER — Ambulatory Visit (INDEPENDENT_AMBULATORY_CARE_PROVIDER_SITE_OTHER): Payer: Medicare Other | Admitting: Pharmacist

## 2014-04-28 DIAGNOSIS — I4891 Unspecified atrial fibrillation: Secondary | ICD-10-CM

## 2014-04-28 DIAGNOSIS — Z5181 Encounter for therapeutic drug level monitoring: Secondary | ICD-10-CM

## 2014-04-28 DIAGNOSIS — Z7901 Long term (current) use of anticoagulants: Secondary | ICD-10-CM

## 2014-04-28 LAB — POCT INR: INR: 3.3

## 2014-05-13 ENCOUNTER — Other Ambulatory Visit: Payer: Self-pay | Admitting: *Deleted

## 2014-05-13 MED ORDER — WARFARIN SODIUM 5 MG PO TABS: ORAL_TABLET | ORAL | Status: DC

## 2014-05-20 ENCOUNTER — Ambulatory Visit (INDEPENDENT_AMBULATORY_CARE_PROVIDER_SITE_OTHER): Payer: Medicare Other | Admitting: *Deleted

## 2014-05-20 ENCOUNTER — Telehealth: Payer: Self-pay | Admitting: *Deleted

## 2014-05-20 DIAGNOSIS — I493 Ventricular premature depolarization: Secondary | ICD-10-CM

## 2014-05-20 LAB — MDC_IDC_ENUM_SESS_TYPE_INCLINIC
Battery Remaining Longevity: 85 mo
Battery Voltage: 2.76 V
Date Time Interrogation Session: 20151117162334
Lead Channel Impedance Value: 0 Ohm
Lead Channel Impedance Value: 442 Ohm
Lead Channel Pacing Threshold Amplitude: 1 V
Lead Channel Setting Sensing Sensitivity: 4 mV
MDC IDC MSMT BATTERY IMPEDANCE: 359 Ohm
MDC IDC MSMT LEADCHNL RV PACING THRESHOLD PULSEWIDTH: 0.4 ms
MDC IDC SET LEADCHNL RV PACING AMPLITUDE: 2.5 V
MDC IDC SET LEADCHNL RV PACING PULSEWIDTH: 0.4 ms
MDC IDC STAT BRADY RV PERCENT PACED: 98 %

## 2014-05-20 NOTE — Telephone Encounter (Signed)
Per Lysbeth Galas, concern about pt's pulse in the 40's. Pulse taken w/ automatic machines at drug store and gym. Pulse also counted by Lysbeth Galas manually. Device lower rate limit set to 60. I explained likely possibility of PVCs that cause mismeasurement as a slower rate. I asked Lysbeth Galas to send a manual transmission so I could verify normal device function.   Transmission shows potential functional failure to capture on non-magnet EGM but functional capture on magnet EGM.   Appt w/ device clinic today at 2:00.

## 2014-05-20 NOTE — Progress Notes (Signed)
Pt c/o of pulse in 40s. Carelink & presenting rhythm show intermittent bigeminal PVCs. Bigeminal pattern more prevalent when pt recumbent---stair walk test displayed sensor overdrive of PVCs---threshold approximately 80bpm. Threshold, impedance consistent with previous measurements. 22 high ventricular rates noted---longest 10sec, max-V 197bpm. Device programmed at appropriate safety margins. Histogram distribution appropriate for patient activity level. Device programmed to optimize intrinsic conduction. Changed lower rate from 60 to 70bpm to decrease bigeminal rate from 38 to 41bpm (Bigeminal extension 387ms) until next visit. Estimated longevity 68yrs. ROV w/ Dr. Rayann Heman 06/09/14.

## 2014-05-26 ENCOUNTER — Encounter: Payer: Self-pay | Admitting: Internal Medicine

## 2014-05-26 ENCOUNTER — Ambulatory Visit (INDEPENDENT_AMBULATORY_CARE_PROVIDER_SITE_OTHER): Payer: Medicare Other | Admitting: Internal Medicine

## 2014-05-26 VITALS — BP 106/60 | HR 72 | Ht 70.0 in | Wt 174.6 lb

## 2014-05-26 DIAGNOSIS — G2581 Restless legs syndrome: Secondary | ICD-10-CM

## 2014-05-26 DIAGNOSIS — G4733 Obstructive sleep apnea (adult) (pediatric): Secondary | ICD-10-CM

## 2014-05-26 NOTE — Assessment & Plan Note (Signed)
He feels comfortable and well controlled with clonazepam. This is controlling his leg jerks and allowing comfortable sleep without morning carryover.

## 2014-05-26 NOTE — Progress Notes (Signed)
Subjective:    Patient ID: [name], male    DOB: [birth year], 80 y.o.   MRN: [id number]  HPI 12/10/10- 14 yoM never smoker followed for OSA, hx Cheynes-Stokes, Restless legs, complicated by CM/ AFib/ brady-tachy/ pacer, allergic rhinitis, bronchitis, GERD. Hx right nephrectomy for cancer. Last here December 31, 2009 - note reviewed  CPAP continues to work fine, 7 cwp.  He likes his new machine.  Nortrpityline was given originally for depression and sleep. We discussed the possibility it was blurring his vision a little. He denies depression now.  He sleeps very well and is unaware of any limb movement disturbance now.   11/14/11- 81 yoM never smoker followed for OSA, hx Cheynes-Stokes, Restless legs, complicated by CM/ AFib/ brady-tachy/ pacer, allergic rhinitis, bronchitis, GERD. Hx right nephrectomy for cancer. Wears CPAP every night; States having Increased SOB as well-dizzy feelings as well; had pacemaker checked recently at cardiology and everything was normal. Describes episodes of feeling weak and dizzy and several situations, not always with exertion. Does think exercise tolerance has declined some. Insomnia with CPAP despite clonazepam 2 mg at bedtime. Sleep latency 2 hours. He thinks the main problem is arthritis and physical discomforts. PFT: 11/04/2011 normal spirometry with high normal lung volumes. FEV1/FVC 0.73. Insignificant response to bronchodilator. DLCO 118%. Room air oximetry with exercise: Ambulated several laps around our office today: 98% at rest with pulse 76. He did not drop below 97% saturation on room air and pulse rose no higher than 75. CT chest 10/28/11- images reviewed with him and his wife: IMPRESSION:  1. The plain film abnormality likely corresponds to a calcified  granuloma within the left upper lobe. There are other tiny  nodules, some of which are calcified. Given the history of  transitional cell carcinoma, follow-up with chest CT at 6 months  should be  considered.  2. No acute process in the chest. Cardiomegaly and small volume  pericardial effusion. The effusion is new since 06/14/2011.  3. Small volume perihepatic ascites. Incompletely imaged but felt  to be similar to 06/14/2011. Question fluid overload.  Original Report Authenticated By: Areta Haber, M.D.   05/15/12- 28 yoM never smoker followed for OSA, hx Cheynes-Stokes, Restless legs, complicated by CM/ AFib/ brady-tachy/ pacer, allergic rhinitis, bronchitis/ multiple granulomas, GERD. Hx right nephrectomy for cancer. Wears CPAP 7/ [facility] every night and presssure doing well; review 6MW with patient. Found out he needed new pacemaker-cause of SOB. Breathing is much better now. 6MWT 05/15/12- 98%, 98%, 100%, 435 m. No discomfort. Good distance with no oxygen limit.  05/20/13- 81 yoM never smoker followed for OSA, hx Cheynes-Stokes, Restless legs, complicated by CM/ AFib/ brady-tachy/ pacer, allergic rhinitis, bronchitis/ multiple granulomas, GERD. Hx right nephrectomy for cancer. FOLLOWS FOR: wearing CPAP every night for about 7 hours; pressure working well for patient. Would like to discuss changing DME([facility] is [facility]) New CPAP machine 7/ [employer]. Sleeps soundly. Restless legs are controlled.  05/26/14- 84 yoM never smoker followed for OSA, hx Cheynes-Stokes, Restless legs, complicated by CM/ AFib/ brady-tachy/ pacer, allergic rhinitis, bronchitis/ multiple granulomas, GERD. Hx right nephrectomy for cancer. FOLLOWS FOR: Wears CPAP 7/ [facility] every night for about 7-8 hours; pressure working well for patient; DME is [facility]. Recent problems with his pacemaker were addressed by increasing the pacing rate last week. Hopefully this will improve how he feels. Insurance change has kept him from his daily exercise at [employer]. CPAP download looks good at pressure of 7. AHI of 8.5 is  a little high but all events are central apneas. He is happy with CPAP "can't live without it", nasal  pillows. No restless legs and sleeping very well using clonazepam with no apparent morning carryover.  ROS-see HPI Constitutional:   No-   weight loss, night sweats, fevers, chills, fatigue, lassitude. HEENT:   No-  headaches, difficulty swallowing, tooth/dental problems, sore throat,       No-  sneezing, itching, ear ache, nasal congestion, post nasal drip,  CV:  No-   chest pain, orthopnea, PND, swelling in lower extremities, anasarca, +dizziness, palpitations Resp: + shortness of breath with exertion or at rest.              No-   productive cough,  No non-productive cough,  No- coughing up of blood.              No-   change in color of mucus.  No- wheezing.   Skin: No-   rash or lesions. GI:  No-   heartburn, indigestion, abdominal pain, nausea, vomiting, GU:  MS:  No-   joint pain or swelling.   Neuro-     nothing unusual Psych:  No- change in mood or affect. No depression or anxiety.  No memory loss.  OBJ- Physical Exam General- Alert, Oriented, Affect-appropriate, Distress- none acute. +thin elderly Skin- rash-none, lesions- none, excoriation- none Lymphadenopathy- none Head- atraumatic            Eyes- Gross vision intact, PERRLA, conjunctivae and secretions clear            Ears- Hearing, canals-normal            Nose- Clear, no-Septal dev, mucus, polyps, erosion, perforation             Throat- Mallampati II , mucosa clear , drainage- none, tonsils- atrophic Neck- flexible , trachea midline, no stridor , thyroid nl, carotid no bruit Chest - symmetrical excursion , unlabored           Heart/CV- RRR , 2/6 SEM AS murmur , no gallop  , no rub, nl s1 s2                           - JVD- none , edema- none, stasis changes- none, varices- none           Lung- clear to P&A, no rales, unlabored, wheeze- none, cough- none , dullness-none, rub- none           Chest wall- +R pacemaker Abd-  Br/ Gen/ Rectal- Not done, not indicated Extrem- cyanosis- none, clubbing, none, atrophy-  none, strength- nl Neuro- grossly intact to observation  Assessment & Plan:

## 2014-05-26 NOTE — Assessment & Plan Note (Signed)
He is very pleased with CPAP at [facility] pressure setting of 7. Download shows AHI of 8.5 but all events appeared to be central apneas. We discussed this and do not need to make changes CPAP was not expected to help with central apnea.

## 2014-05-26 NOTE — Patient Instructions (Addendum)
We can continue CPAP 7/ [facility]   Please call as needed   

## 2014-06-09 ENCOUNTER — Encounter: Payer: Self-pay | Admitting: Internal Medicine

## 2014-06-09 ENCOUNTER — Ambulatory Visit (INDEPENDENT_AMBULATORY_CARE_PROVIDER_SITE_OTHER): Payer: Medicare Other | Admitting: *Deleted

## 2014-06-09 ENCOUNTER — Ambulatory Visit (INDEPENDENT_AMBULATORY_CARE_PROVIDER_SITE_OTHER): Payer: Medicare Other | Admitting: Internal Medicine

## 2014-06-09 VITALS — BP 104/68 | HR 84 | Ht 70.0 in | Wt 174.8 lb

## 2014-06-09 DIAGNOSIS — I493 Ventricular premature depolarization: Secondary | ICD-10-CM

## 2014-06-09 DIAGNOSIS — I4891 Unspecified atrial fibrillation: Secondary | ICD-10-CM

## 2014-06-09 DIAGNOSIS — I442 Atrioventricular block, complete: Secondary | ICD-10-CM

## 2014-06-09 DIAGNOSIS — Z5181 Encounter for therapeutic drug level monitoring: Secondary | ICD-10-CM

## 2014-06-09 DIAGNOSIS — Z7901 Long term (current) use of anticoagulants: Secondary | ICD-10-CM

## 2014-06-09 DIAGNOSIS — I495 Sick sinus syndrome: Secondary | ICD-10-CM

## 2014-06-09 LAB — MDC_IDC_ENUM_SESS_TYPE_INCLINIC
Battery Impedance: 359 Ohm
Battery Voltage: 2.76 V
Brady Statistic RV Percent Paced: 80 %
Lead Channel Impedance Value: 0 Ohm
Lead Channel Impedance Value: 442 Ohm
Lead Channel Pacing Threshold Amplitude: 1.25 V
Lead Channel Pacing Threshold Pulse Width: 0.4 ms
Lead Channel Setting Pacing Amplitude: 2.5 V
Lead Channel Setting Pacing Pulse Width: 0.4 ms
Lead Channel Setting Sensing Sensitivity: 4 mV
MDC IDC MSMT BATTERY REMAINING LONGEVITY: 85 mo
MDC IDC SESS DTM: 20151207214020

## 2014-06-09 LAB — POCT INR: INR: 3

## 2014-06-09 MED ORDER — ATENOLOL 25 MG PO TABS: 25.0000 mg | ORAL_TABLET | Freq: Two times a day (BID) | ORAL | Status: DC

## 2014-06-09 NOTE — Progress Notes (Signed)
PCP: Irven Shelling, MD  [name] is a 80 y.o. male who presents today for routine electrophysiology followup.  Since his last visit to our office, the patient reports doing reasonably well.  His dog recently pulled his shoulder when walking on a leash.  His shoulder has been hurting him quite a bit recently.  He also had symptomatic PVCs with a low effective heart rate and symptoms of fatigue.  He was evaluated in the device clinic and his lower rate was increased from 60 to 70 bpm with significant improvement in symptoms.  Today, he denies symptoms of palpitations, chest pain, shortness of breath,  lower extremity edema, dizziness, presyncope, or syncope.  The patient is otherwise without complaint today.   Past Medical History  Diagnosis Date  . Allergic rhinitis   . RLS (restless legs syndrome)   . Permanent atrial fibrillation     on coumadin  . Esophageal reflux   . Bronchitis   . OSA (obstructive sleep apnea)   . Normal nuclear stress test 2013  . Transitional cell carcinoma     s/p nephrectomy in  2010  . Complete heart block     pacemaker originally placed in 1988  . BPH (benign prostatic hyperplasia)   . Lung nodule     noted on CXR December 2012  . Hypertension   . Dementia   . Pneumonia   . Pacemaker   . Renal insufficiency     Rt kidney removed d/t maglinant tumor  . Arthritis    Past Surgical History  Procedure Laterality Date  . Pacemaker insertion  1988    most recent generator change by Dr Rayann Heman 20067/2/13 with new right ventricular lead placed  . Nephrectomy  2010    rt  . Hernia repair      multiple  . Repair ankle ligament    . US echocardiography  04/22/2008    EF 55-60%  . Doppler echocardiography  03/26/2001    EF 55%  . Cardiovascular stress test  03/23/2009    EF 65%, NORMAL  . Eye surgery      tumor removed from Rt eye lid  . Mass excision Left 11/07/2012    Procedure: MINOR EXCISION OF MASS;  Surgeon: Haywood Lasso, MD;   Location: [place];  Service: General;  Laterality: Left;    [facility] Prescriptions  Medication Sig Dispense Refill  . aspirin 81 MG tablet Take 81 mg by mouth daily.      Marland Kitchen atenolol (TENORMIN) 25 MG tablet Take 1 tablet (25 mg total) by mouth 2 (two) times daily. 180 tablet 3  . clonazePAM (KLONOPIN) 2 MG tablet Take 1 tablet (2 mg total) by mouth at bedtime. 90 tablet 1  . cycloSPORINE (RESTASIS) 0.05 % ophthalmic emulsion Place 1 drop into both eyes 2 (two) times daily.     Marland Kitchen doxazosin (CARDURA) 2 MG tablet Take 2 mg by mouth daily.    . finasteride (PROSCAR) 5 MG tablet Take 1 tablet (5 mg total) by mouth daily. 90 tablet 3  . fluorouracil (EFUDEX) 5 % cream Apply topically 2 (two) times daily.    Marland Kitchen FLUoxetine (PROZAC) 20 MG capsule Take 20 mg by mouth daily.    . furosemide (LASIX) 40 MG tablet Take 40 mg by mouth daily.     Marland Kitchen galantamine (RAZADYNE) 8 MG tablet Take 8 mg by mouth 2 (two) times daily.    Marland Kitchen GLUCOSAMINE-CHONDROITIN PO Take 1 tablet by mouth twice  a day    . hydroxypropyl methylcellulose (ISOPTO TEARS) 2.5 % ophthalmic solution Place 1 drop into both eyes at bedtime.     Marland Kitchen ipratropium (ATROVENT) 0.06 % nasal spray Place 2 sprays into the nose 3 (three) times daily as needed for rhinitis. 127 mL 3  . Multiple Vitamin (MULTIVITAMIN) capsule Take 1 capsule by mouth daily.      Marland Kitchen omeprazole (PRILOSEC) 20 MG capsule Take 20 mg by mouth 2 (two) times daily before a meal.     . simvastatin (ZOCOR) 10 MG tablet Take 1 tablet (10 mg total) by mouth at bedtime. 90 tablet 0  . warfarin (COUMADIN) 5 MG tablet Take as directed per Anticoagulation Clinic 90 tablet 1   No [facility] facility-administered medications for this visit.    Physical Exam: Filed Vitals:   06/09/14 1627  BP: 104/68  Pulse: 84  Height: 5\' 10"  (1.778 m)  Weight: 174 lb 12.8 oz (79.289 kg)    GEN- The patient is well appearing, alert and oriented x 3 today.   Head- normocephalic,  atraumatic Eyes-  Sclera clear, conjunctiva pink Ears- hearing intact Oropharynx- clear Lungs- Clear to ausculation bilaterally, normal work of breathing Chest- pacemaker pocket is well healed Heart- Regular rate and rhythm (paced), 2/6 SEM LSB GI- soft, NT, ND, + BS Extremities- no clubbing, cyanosis, or edema MS- he is guarding his R shoulder due to pain  Pacemaker interrogation- reviewed in detail today,  See PACEART report  Assessment and Plan:  1. Complete heart block Normal pacemaker function See Pace Art report No changes today  2. Permanent Afib Continue long term anticoagulation with coumadin He had prior TIA when only on coumadin.  He was seen by neurology who recommended both ASA and coumadin.  I will therefore make no changes to his regimen.  3. NSVT/ PVCs- worsened recently I will increase atenolol to 25mg  BID Obtain an echo to evaluate for structural changes, reduced EF as a possible cause Bmet, mg, tfts  Return in 6 weeks to evaluate effect of atenolol changes and evaluate his PVCs further. Carelink every 3 months

## 2014-06-09 NOTE — Patient Instructions (Signed)
Your physician recommends that you schedule a follow-up appointment in: 6  Weeks with Dr. Rayann Heman    Your physician has recommended you make the following change in your medication:  1) Increase Atenolol to 25mg  twice daily  Your physician has requested that you have an echocardiogram. Echocardiography is a painless test that uses sound waves to create images of your heart. It provides your doctor with information about the size and shape of your heart and how well your heart's chambers and valves are working. This procedure takes approximately one hour. There are no restrictions for this procedure.   Your physician recommends that you return for lab work same day as echo: BMP/MAG/TSH/Free T4

## 2014-06-10 ENCOUNTER — Telehealth: Payer: Self-pay | Admitting: Internal Medicine

## 2014-06-10 NOTE — Telephone Encounter (Signed)
New Msg   Pt took wife Cameron's pills which were xanax 0.5 mg,gadapentine 600mg , duloxetine 60 mg , tramadol 50 mg, adderrall extended 30 mg about half [phone].

## 2014-06-10 NOTE — Telephone Encounter (Signed)
Spoke with wife and he is okay she is just going to keep a close watch on him today.

## 2014-06-11 ENCOUNTER — Other Ambulatory Visit: Payer: Self-pay | Admitting: *Deleted

## 2014-06-11 DIAGNOSIS — I4891 Unspecified atrial fibrillation: Secondary | ICD-10-CM

## 2014-06-11 MED ORDER — ATENOLOL 25 MG PO TABS: 25.0000 mg | ORAL_TABLET | Freq: Two times a day (BID) | ORAL | Status: DC

## 2014-06-12 ENCOUNTER — Other Ambulatory Visit (INDEPENDENT_AMBULATORY_CARE_PROVIDER_SITE_OTHER): Payer: Medicare Other | Admitting: *Deleted

## 2014-06-12 ENCOUNTER — Ambulatory Visit (HOSPITAL_COMMUNITY): Payer: Medicare Other | Attending: Internal Medicine

## 2014-06-12 ENCOUNTER — Encounter (HOSPITAL_COMMUNITY): Payer: Self-pay | Admitting: Internal Medicine

## 2014-06-12 ENCOUNTER — Other Ambulatory Visit (HOSPITAL_COMMUNITY): Payer: Medicare Other

## 2014-06-12 DIAGNOSIS — I4891 Unspecified atrial fibrillation: Secondary | ICD-10-CM | POA: Diagnosis not present

## 2014-06-12 DIAGNOSIS — I493 Ventricular premature depolarization: Secondary | ICD-10-CM | POA: Diagnosis not present

## 2014-06-12 DIAGNOSIS — R011 Cardiac murmur, unspecified: Secondary | ICD-10-CM

## 2014-06-12 LAB — BASIC METABOLIC PANEL
BUN: 41 mg/dL — ABNORMAL HIGH (ref 6–23)
CHLORIDE: 104 meq/L (ref 96–112)
CO2: 24 mEq/L (ref 19–32)
Calcium: 9.4 mg/dL (ref 8.4–10.5)
Creatinine, Ser: 1.6 mg/dL — ABNORMAL HIGH (ref 0.4–1.5)
GFR: 44.15 mL/min — ABNORMAL LOW (ref >60.00)
GLUCOSE: 172 mg/dL — AB (ref 70–99)
POTASSIUM: 4.8 meq/L (ref 3.5–5.1)
Sodium: 135 mEq/L (ref 135–145)

## 2014-06-12 LAB — T4, FREE: Free T4: 1.03 ng/dL (ref 0.60–1.60)

## 2014-06-12 LAB — MAGNESIUM: Magnesium: 2.2 mg/dL (ref 1.5–2.5)

## 2014-06-12 LAB — TSH: TSH: 0.31 u[IU]/mL — ABNORMAL LOW (ref 0.35–4.50)

## 2014-06-12 NOTE — Progress Notes (Signed)
2D Echo completed. 06/12/2014

## 2014-06-16 ENCOUNTER — Telehealth: Payer: Self-pay | Admitting: Internal Medicine

## 2014-06-16 NOTE — Telephone Encounter (Signed)
New message      Dr Rayann Heman increased his atenolol.  He is having trouble staying away.  Could this be the medication?

## 2014-06-17 NOTE — Telephone Encounter (Signed)
Spoke with patient and he is sick with bronchitis.  Once he is over this can assess the fatigue.  Could all be related to him being sick and not the medication.  He went to his PCP today and has a follow up

## 2014-06-18 ENCOUNTER — Other Ambulatory Visit: Payer: Self-pay | Admitting: Internal Medicine

## 2014-06-24 ENCOUNTER — Ambulatory Visit
Admission: RE | Admit: 2014-06-24 | Discharge: 2014-06-24 | Disposition: A | Discharge status: Home / Self Care | Payer: Medicare Other | Source: Ambulatory Visit | Attending: Internal Medicine | Admitting: Internal Medicine

## 2014-06-24 ENCOUNTER — Other Ambulatory Visit: Payer: Self-pay | Admitting: Internal Medicine

## 2014-06-24 DIAGNOSIS — J209 Acute bronchitis, unspecified: Secondary | ICD-10-CM

## 2014-06-30 ENCOUNTER — Encounter: Payer: Self-pay | Admitting: Internal Medicine

## 2014-07-07 ENCOUNTER — Telehealth: Payer: Self-pay | Admitting: Internal Medicine

## 2014-07-07 NOTE — Telephone Encounter (Signed)
New message     Pt c/o medication issue: 1. Name of Medication: atenolol 2. How are you currently taking this medication (dosage and times per day)? Atenolol 10mg --- one at morning and one at bedtime 3. Are you having a reaction (difficulty breathing--STAT)?  no 4. What is your medication issue? Should he take 2 atenolol in am or continue the way he is taking in

## 2014-07-07 NOTE — Telephone Encounter (Signed)
Called spoke with spouse. She wants to know if pt can use CPAP during the day with naps. I advised he can do so. Nothing further needed

## 2014-07-07 NOTE — Telephone Encounter (Signed)
Spoke with patient re: Atenolol 25 mg.  He is currently taking it bid, but wants to know if he should take both pills in the am. I advised him to continue it bid to maintain therapeutic levels in his blood. Patient voiced good understanding.

## 2014-07-11 ENCOUNTER — Ambulatory Visit (INDEPENDENT_AMBULATORY_CARE_PROVIDER_SITE_OTHER): Payer: Commercial Managed Care - HMO | Admitting: *Deleted

## 2014-07-11 DIAGNOSIS — Z7901 Long term (current) use of anticoagulants: Secondary | ICD-10-CM

## 2014-07-11 DIAGNOSIS — I4891 Unspecified atrial fibrillation: Secondary | ICD-10-CM

## 2014-07-11 DIAGNOSIS — Z5181 Encounter for therapeutic drug level monitoring: Secondary | ICD-10-CM

## 2014-07-11 LAB — POCT INR: INR: 3

## 2014-07-21 ENCOUNTER — Ambulatory Visit
Admission: RE | Admit: 2014-07-21 | Discharge: 2014-07-21 | Disposition: A | Discharge status: Home / Self Care | Payer: Commercial Managed Care - HMO | Source: Ambulatory Visit | Attending: Internal Medicine | Admitting: Internal Medicine

## 2014-07-21 ENCOUNTER — Other Ambulatory Visit: Payer: Self-pay | Admitting: Internal Medicine

## 2014-07-21 DIAGNOSIS — J189 Pneumonia, unspecified organism: Secondary | ICD-10-CM

## 2014-07-22 ENCOUNTER — Telehealth: Payer: Self-pay

## 2014-07-22 NOTE — Telephone Encounter (Signed)
Please have primary care fill this medicine.  If he does not have PCP, we can do for 30 days while he finds one.

## 2014-07-23 NOTE — Telephone Encounter (Signed)
Talked to patient's wife to let her know that the proscar needs to go to Pcp

## 2014-07-24 ENCOUNTER — Other Ambulatory Visit: Payer: Self-pay | Admitting: Dermatology

## 2014-07-28 ENCOUNTER — Ambulatory Visit (INDEPENDENT_AMBULATORY_CARE_PROVIDER_SITE_OTHER): Payer: Commercial Managed Care - HMO | Admitting: *Deleted

## 2014-07-28 ENCOUNTER — Encounter: Payer: Self-pay | Admitting: Internal Medicine

## 2014-07-28 ENCOUNTER — Ambulatory Visit (INDEPENDENT_AMBULATORY_CARE_PROVIDER_SITE_OTHER): Payer: Commercial Managed Care - HMO | Admitting: Internal Medicine

## 2014-07-28 VITALS — BP 136/80 | HR 54 | Ht 70.0 in | Wt 181.4 lb

## 2014-07-28 DIAGNOSIS — I495 Sick sinus syndrome: Secondary | ICD-10-CM

## 2014-07-28 DIAGNOSIS — Z5181 Encounter for therapeutic drug level monitoring: Secondary | ICD-10-CM

## 2014-07-28 DIAGNOSIS — I4891 Unspecified atrial fibrillation: Secondary | ICD-10-CM

## 2014-07-28 DIAGNOSIS — Z7901 Long term (current) use of anticoagulants: Secondary | ICD-10-CM

## 2014-07-28 DIAGNOSIS — I442 Atrioventricular block, complete: Secondary | ICD-10-CM

## 2014-07-28 LAB — MDC_IDC_ENUM_SESS_TYPE_INCLINIC
Battery Impedance: 384 Ohm
Battery Remaining Longevity: 84 mo
Brady Statistic RV Percent Paced: 80 %
Date Time Interrogation Session: 20160125153525
Lead Channel Impedance Value: 448 Ohm
Lead Channel Pacing Threshold Amplitude: 0.75 V
Lead Channel Setting Pacing Amplitude: 2.5 V
Lead Channel Setting Sensing Sensitivity: 4 mV
MDC IDC MSMT BATTERY VOLTAGE: 2.76 V
MDC IDC MSMT LEADCHNL RA IMPEDANCE VALUE: 0 Ohm
MDC IDC MSMT LEADCHNL RV PACING THRESHOLD PULSEWIDTH: 0.4 ms
MDC IDC SET LEADCHNL RV PACING PULSEWIDTH: 0.4 ms

## 2014-07-28 LAB — POCT INR: INR: 2.7

## 2014-07-28 NOTE — Patient Instructions (Addendum)
Your physician wants you to follow-up in: 6 months with Dr. Vallery Ridge will receive a reminder letter in the mail two months in advance. If you don't receive a letter, please call our office to schedule the follow-up appointment.    Remote monitoring is used to monitor your Pacemaker or ICD from home. This monitoring reduces the number of office visits required to check your device to one time per year. It allows [place] to keep an eye on the functioning of your device to ensure it is working properly. You are scheduled for a device check from home on 10/27/14. You may send your transmission at any time that day. If you have a wireless device, the transmission will be sent automatically. After your physician reviews your transmission, you will receive a postcard with your next transmission date.

## 2014-07-28 NOTE — Progress Notes (Signed)
PCP: Irven Shelling, MD  [name] is a 81 y.o. male who presents today for routine electrophysiology followup.  Since his last visit to our office, the patient reports that he was diagnosed with pneumonia.  He is now recovering well.   His PVCs are less worrisome.  His chronic shoulder pain is stable.  Today, he denies symptoms of palpitations, chest pain, shortness of breath,  lower extremity edema, dizziness, presyncope, or syncope.  The patient is otherwise without complaint today.   Past Medical History  Diagnosis Date  . Allergic rhinitis   . RLS (restless legs syndrome)   . Permanent atrial fibrillation     on coumadin  . Esophageal reflux   . Bronchitis   . OSA (obstructive sleep apnea)   . Normal nuclear stress test 2013  . Transitional cell carcinoma     s/p nephrectomy in  2010  . Complete heart block     pacemaker originally placed in 1988  . BPH (benign prostatic hyperplasia)   . Lung nodule     noted on CXR December 2012  . Hypertension   . Dementia   . Pneumonia   . Pacemaker   . Renal insufficiency     Rt kidney removed d/t maglinant tumor  . Arthritis    Past Surgical History  Procedure Laterality Date  . Pacemaker insertion  1988    most recent generator change by Dr Rayann Heman 20067/2/13 with new right ventricular lead placed  . Nephrectomy  2010    rt  . Hernia repair      multiple  . Repair ankle ligament    . US echocardiography  04/22/2008    EF 55-60%  . Doppler echocardiography  03/26/2001    EF 55%  . Cardiovascular stress test  03/23/2009    EF 65%, NORMAL  . Eye surgery      tumor removed from Rt eye lid  . Mass excision Left 11/07/2012    Procedure: MINOR EXCISION OF MASS;  Surgeon: Haywood Lasso, MD;  Location: [place];  Service: General;  Laterality: Left;  . Permanent pacemaker generator change N/A 01/03/2012    Procedure: PERMANENT PACEMAKER GENERATOR CHANGE;  Surgeon: Thompson Grayer, MD;  Location: [facility] CATH LAB;   Service: Cardiovascular;  Laterality: N/A;    [name] Prescriptions  Medication Sig Dispense Refill  . aspirin 81 MG tablet Take 81 mg by mouth daily.      Marland Kitchen atenolol (TENORMIN) 25 MG tablet Take 1 tablet (25 mg total) by mouth 2 (two) times daily. 180 tablet 3  . clonazePAM (KLONOPIN) 2 MG tablet Take 1 tablet (2 mg total) by mouth at bedtime. 90 tablet 1  . cycloSPORINE (RESTASIS) 0.05 % ophthalmic emulsion Place 1 drop into both eyes 2 (two) times daily.     Marland Kitchen doxazosin (CARDURA) 2 MG tablet Take 2 mg by mouth daily.    . finasteride (PROSCAR) 5 MG tablet Take 1 tablet (5 mg total) by mouth daily. 90 tablet 3  . fluorouracil (EFUDEX) 5 % cream Apply 1 application topically daily.     Marland Kitchen FLUoxetine (PROZAC) 20 MG capsule Take 20 mg by mouth daily.    . furosemide (LASIX) 40 MG tablet Take 40 mg by mouth daily.     Marland Kitchen galantamine (RAZADYNE) 8 MG tablet Take 8 mg by mouth 2 (two) times daily.    Marland Kitchen GLUCOSAMINE-CHONDROITIN PO Take 1 tablet by mouth in the morning    . hydroxypropyl methylcellulose (  ISOPTO TEARS) 2.5 % ophthalmic solution Place 1 drop into both eyes at bedtime.     Marland Kitchen ipratropium (ATROVENT) 0.06 % nasal spray Place 2 sprays into the nose 3 (three) times daily as needed for rhinitis. 127 mL 3  . Multiple Vitamin (MULTIVITAMIN) capsule Take 1 capsule by mouth daily.      . Omega-3 Fatty Acids (OMEGA 3 PO) Take 1,500 mg by mouth daily.    Marland Kitchen omeprazole (PRILOSEC) 20 MG capsule Take 20 mg by mouth 2 (two) times daily before a meal.     . simvastatin (ZOCOR) 10 MG tablet TAKE 1 BY MOUTH AT BEDTIME (Patient taking differently: TAKE 1 TABLET BY MOUTH AT BEDTIME) 90 tablet 0  . warfarin (COUMADIN) 5 MG tablet Take as directed per Anticoagulation Clinic 90 tablet 1   No [facility] facility-administered medications for this visit.    Physical Exam: Filed Vitals:   07/28/14 1503  BP: 136/80  Pulse: 54  Height: 5\' 10"  (1.778 m)  Weight: 181 lb 6.4 oz (82.283 kg)    GEN- The  patient is well appearing, alert and oriented x 3 today.   Head- normocephalic, atraumatic Eyes-  Sclera clear, conjunctiva pink Ears- hearing intact Oropharynx- clear Lungs- Clear to ausculation bilaterally, normal work of breathing Chest- pacemaker pocket is well healed Heart- Regular rate and rhythm (paced), 2/6 SEM LSB GI- soft, NT, ND, + BS Extremities- no clubbing, cyanosis, or edema MS- he is guarding his R shoulder due to pain  Pacemaker interrogation- reviewed in detail today,  See PACEART report  Assessment and Plan:  1. Complete heart block Normal pacemaker function See Pace Art report No changes today  2. Permanent Afib Continue long term anticoagulation with coumadin He had prior TIA when only on coumadin.  He was seen by neurology who recommended both ASA and coumadin.  I will therefore make no changes to his regimen.  3. NSVT/ PVCs- improved with increased atenolol last visit Echo and labs are reviewed with patient.  Return in 6 months carelink

## 2014-08-06 ENCOUNTER — Encounter: Payer: Self-pay | Admitting: Internal Medicine

## 2014-08-07 ENCOUNTER — Other Ambulatory Visit: Payer: Self-pay | Admitting: Internal Medicine

## 2014-08-07 ENCOUNTER — Ambulatory Visit
Admission: RE | Admit: 2014-08-07 | Discharge: 2014-08-07 | Disposition: A | Discharge status: Home / Self Care | Payer: Commercial Managed Care - HMO | Source: Ambulatory Visit | Attending: Internal Medicine | Admitting: Internal Medicine

## 2014-08-07 DIAGNOSIS — J189 Pneumonia, unspecified organism: Secondary | ICD-10-CM

## 2014-08-10 ENCOUNTER — Other Ambulatory Visit: Payer: Self-pay

## 2014-08-10 MED ORDER — FUROSEMIDE 40 MG PO TABS: 40.0000 mg | ORAL_TABLET | Freq: Every day | ORAL | Status: DC

## 2014-08-10 MED ORDER — SIMVASTATIN 10 MG PO TABS: 10.0000 mg | ORAL_TABLET | Freq: Every day | ORAL | Status: DC

## 2014-08-12 ENCOUNTER — Other Ambulatory Visit: Payer: Self-pay | Admitting: *Deleted

## 2014-08-12 MED ORDER — WARFARIN SODIUM 5 MG PO TABS: ORAL_TABLET | ORAL | Status: DC

## 2014-08-18 ENCOUNTER — Other Ambulatory Visit: Payer: Self-pay | Admitting: *Deleted

## 2014-08-18 ENCOUNTER — Telehealth: Payer: Self-pay | Admitting: Internal Medicine

## 2014-08-18 MED ORDER — CLONAZEPAM 2 MG PO TABS: 2.0000 mg | ORAL_TABLET | Freq: Every day | ORAL | Status: DC

## 2014-08-18 NOTE — Telephone Encounter (Signed)
Ok to refill Clonazepam #90 with 3 refills, per Dr. Annamaria Boots.  [facility].  Patient notified.  Nothing further needed.

## 2014-08-25 ENCOUNTER — Other Ambulatory Visit: Payer: Self-pay | Admitting: *Deleted

## 2014-08-25 ENCOUNTER — Telehealth: Payer: Self-pay | Admitting: *Deleted

## 2014-08-25 MED ORDER — WARFARIN SODIUM 5 MG PO TABS: ORAL_TABLET | ORAL | Status: DC

## 2014-08-25 NOTE — Telephone Encounter (Signed)
Rx refill sent to pharmacy as requested. 

## 2014-08-26 ENCOUNTER — Other Ambulatory Visit: Payer: Self-pay | Admitting: Internal Medicine

## 2014-08-26 DIAGNOSIS — R9389 Abnormal findings on diagnostic imaging of other specified body structures: Secondary | ICD-10-CM

## 2014-08-29 ENCOUNTER — Ambulatory Visit
Admission: RE | Admit: 2014-08-29 | Discharge: 2014-08-29 | Disposition: A | Discharge status: Home / Self Care | Payer: Commercial Managed Care - HMO | Source: Ambulatory Visit | Attending: Internal Medicine | Admitting: Internal Medicine

## 2014-08-29 DIAGNOSIS — R9389 Abnormal findings on diagnostic imaging of other specified body structures: Secondary | ICD-10-CM

## 2014-09-08 ENCOUNTER — Ambulatory Visit (INDEPENDENT_AMBULATORY_CARE_PROVIDER_SITE_OTHER): Payer: Commercial Managed Care - HMO

## 2014-09-08 DIAGNOSIS — Z5181 Encounter for therapeutic drug level monitoring: Secondary | ICD-10-CM

## 2014-09-08 DIAGNOSIS — I4891 Unspecified atrial fibrillation: Secondary | ICD-10-CM

## 2014-09-08 DIAGNOSIS — Z7901 Long term (current) use of anticoagulants: Secondary | ICD-10-CM

## 2014-09-08 LAB — POCT INR: INR: 2.6

## 2014-09-17 ENCOUNTER — Other Ambulatory Visit: Payer: Self-pay

## 2014-09-17 DIAGNOSIS — I4891 Unspecified atrial fibrillation: Secondary | ICD-10-CM

## 2014-09-17 MED ORDER — ATENOLOL 25 MG PO TABS: 25.0000 mg | ORAL_TABLET | Freq: Two times a day (BID) | ORAL | Status: DC

## 2014-09-19 ENCOUNTER — Other Ambulatory Visit: Payer: Self-pay | Admitting: *Deleted

## 2014-09-19 DIAGNOSIS — I4891 Unspecified atrial fibrillation: Secondary | ICD-10-CM

## 2014-09-19 MED ORDER — ATENOLOL 25 MG PO TABS: 25.0000 mg | ORAL_TABLET | Freq: Two times a day (BID) | ORAL | Status: DC

## 2014-09-30 ENCOUNTER — Other Ambulatory Visit: Payer: Self-pay

## 2014-09-30 DIAGNOSIS — I4891 Unspecified atrial fibrillation: Secondary | ICD-10-CM

## 2014-09-30 MED ORDER — ATENOLOL 25 MG PO TABS: 25.0000 mg | ORAL_TABLET | Freq: Two times a day (BID) | ORAL | Status: DC

## 2014-10-01 ENCOUNTER — Other Ambulatory Visit: Payer: Self-pay

## 2014-10-01 DIAGNOSIS — I4891 Unspecified atrial fibrillation: Secondary | ICD-10-CM

## 2014-10-01 MED ORDER — ATENOLOL 25 MG PO TABS: 25.0000 mg | ORAL_TABLET | Freq: Two times a day (BID) | ORAL | Status: DC

## 2014-10-20 ENCOUNTER — Ambulatory Visit (INDEPENDENT_AMBULATORY_CARE_PROVIDER_SITE_OTHER): Payer: Commercial Managed Care - HMO | Admitting: *Deleted

## 2014-10-20 ENCOUNTER — Telehealth: Payer: Self-pay | Admitting: Internal Medicine

## 2014-10-20 DIAGNOSIS — Z5181 Encounter for therapeutic drug level monitoring: Secondary | ICD-10-CM | POA: Diagnosis not present

## 2014-10-20 DIAGNOSIS — Z7901 Long term (current) use of anticoagulants: Secondary | ICD-10-CM

## 2014-10-20 DIAGNOSIS — I4891 Unspecified atrial fibrillation: Secondary | ICD-10-CM

## 2014-10-20 LAB — POCT INR: INR: 2.3

## 2014-10-20 MED ORDER — IPRATROPIUM BROMIDE 0.06 % NA SOLN: 2.0000 | Freq: Three times a day (TID) | NASAL | Status: DC | PRN

## 2014-10-20 NOTE — Telephone Encounter (Signed)
Rx has been sent in. Pt is aware. Nothing further was needed. 

## 2014-10-27 ENCOUNTER — Encounter: Payer: Self-pay | Admitting: Internal Medicine

## 2014-10-27 ENCOUNTER — Ambulatory Visit (INDEPENDENT_AMBULATORY_CARE_PROVIDER_SITE_OTHER): Payer: Commercial Managed Care - HMO | Admitting: *Deleted

## 2014-10-27 ENCOUNTER — Telehealth: Payer: Self-pay | Admitting: Cardiology

## 2014-10-27 DIAGNOSIS — I495 Sick sinus syndrome: Secondary | ICD-10-CM

## 2014-10-27 LAB — MDC_IDC_ENUM_SESS_TYPE_REMOTE
Battery Remaining Longevity: 77 mo
Battery Voltage: 2.76 V
Brady Statistic RV Percent Paced: 88 %
Lead Channel Impedance Value: 448 Ohm
Lead Channel Pacing Threshold Pulse Width: 0.4 ms
Lead Channel Setting Pacing Pulse Width: 0.4 ms
MDC IDC MSMT BATTERY IMPEDANCE: 460 Ohm
MDC IDC MSMT LEADCHNL RA IMPEDANCE VALUE: 0 Ohm
MDC IDC MSMT LEADCHNL RV PACING THRESHOLD AMPLITUDE: 0.75 V
MDC IDC SESS DTM: 20160425184700
MDC IDC SET LEADCHNL RV PACING AMPLITUDE: 2.5 V
MDC IDC SET LEADCHNL RV SENSING SENSITIVITY: 4 mV

## 2014-10-27 NOTE — Telephone Encounter (Signed)
Spoke with pt and reminded pt of remote transmission that is due today. Pt verbalized understanding.   

## 2014-10-28 NOTE — Progress Notes (Signed)
Remote pacemaker transmission.   

## 2014-11-04 ENCOUNTER — Encounter: Payer: Self-pay | Admitting: Cardiology

## 2014-11-17 ENCOUNTER — Telehealth: Payer: Self-pay | Admitting: Internal Medicine

## 2014-11-17 DIAGNOSIS — G4733 Obstructive sleep apnea (adult) (pediatric): Secondary | ICD-10-CM

## 2014-11-17 NOTE — Telephone Encounter (Signed)
We can order a CPAP download from his DME company that will tell us if CPAP is controlling events. After we see that, if any question, we can check his oxygenation on CPAP. These will tell us what we need to know I think.

## 2014-11-17 NOTE — Telephone Encounter (Signed)
lmomtcb for pt 

## 2014-11-17 NOTE — Telephone Encounter (Signed)
Spoke with pt. Reports he is seeing a neurologist, Dr. Sherron Ales in [place], for memory issues.   Per pt, Dr. Justine Null feels the cpap may be contributing to pt's memory problems and feels cpap pressure should be adjusted.   Pt reports Dr. Justine Null is recommending pt have a sleep study.  Pt not sure he agrees with this -- states he is sleeping fine and not having problems with cpap.  Pt would like CY's thoughts and states if Dr. Annamaria Boots feels a sleep study is a good idea, Dr. Justine Null is rec Dr. Annamaria Boots order this.  Dr. Annamaria Boots, pls advise.  Thank you.  Last OV with CY: 05/26/14; asked to f/u in  1 yr.

## 2014-11-17 NOTE — Telephone Encounter (Signed)
Pt returned call [phone]

## 2014-11-17 NOTE — Telephone Encounter (Signed)
LMTCB

## 2014-11-19 NOTE — Telephone Encounter (Signed)
Download being faxed to front fax to [facility] attention.  Will forward to [place] to look out for.

## 2014-11-19 NOTE — Telephone Encounter (Signed)
Pt returning call to nurse [phone]

## 2014-11-19 NOTE — Telephone Encounter (Signed)
Spoke with pt to make him aware of CY's recs.  Download has already been requested from [place].   Pt is needing a new chin strap.   CY are you ok with ordering this?  Thanks!

## 2014-11-19 NOTE — Telephone Encounter (Signed)
Ok to order replacement chin strap through his DME for dx OSA

## 2014-11-20 NOTE — Telephone Encounter (Signed)
Spoke with Maudie Mercury, pt's wife and informed her that pt needs to take chip by [place] office to have download.  She states that pt changed to [facility] in 07/2014 and has been using [facility] since then.  I instructed her to have pt take chip to [facility] office so they can send [place] a download.  Will forward to [place] to watch out for download.

## 2014-11-20 NOTE — Telephone Encounter (Signed)
Order placed for new chin strap.  Will forward back to [facility] to look out for download.

## 2014-11-20 NOTE — Telephone Encounter (Signed)
Spoke with [place] at [place] to get download faxed to [place]; the last one is from 05-2014. They have tried several times to reach patient to get download as they need his chip from his CPAP machine. They have not been able to reach patient. Triage please contact patient and let him know that he will need to reach out to [place] so we can have a recent download. Thanks.

## 2014-11-24 NOTE — Telephone Encounter (Signed)
We have received the download from [facility]-based on results we need to increase patients CPAP to 9 for better control. Thanks.

## 2014-11-24 NOTE — Telephone Encounter (Signed)
I spoke with patient about results and he verbalized understanding and had no questions 

## 2014-11-24 NOTE — Telephone Encounter (Signed)
Pt returned call - [phone]

## 2014-11-24 NOTE — Telephone Encounter (Signed)
Order to change cpap pressure. lmtcb X1 to make pt aware.

## 2014-11-25 ENCOUNTER — Telehealth: Payer: Self-pay | Admitting: Internal Medicine

## 2014-11-25 NOTE — Telephone Encounter (Signed)
Spoke with pt, states after speaking to nurse yesterday about changing pressure he does not know how to do this.  I advised that his homecare company would be the ones to change his pressure, and that the order had been sent to his DME just this morning. Nothing further needed at this time.

## 2014-11-27 ENCOUNTER — Telehealth: Payer: Self-pay | Admitting: Internal Medicine

## 2014-11-27 DIAGNOSIS — G4733 Obstructive sleep apnea (adult) (pediatric): Secondary | ICD-10-CM

## 2014-11-27 NOTE — Telephone Encounter (Signed)
Pt switched DME from [facility] to [employer] coverage.  Pt states that this has been in effect as of 07/04/2014 when he changed to [place] states that he has been dealing with someone named Melissa at [employer] pt that I would take care of this and if anything further needed I would call him.  Spoke [place] at [place], pt has been established with them since Jan 2016.  Orders for chin strap and pressure change faxed to [facility] and Lynn Ito is aware that this needs to be expedited.  Nothing further needed.

## 2014-11-27 NOTE — Addendum Note (Signed)
Addended by: Virl Cagey on: 11/27/2014 04:09 PM   Modules accepted: Orders

## 2014-11-27 NOTE — Telephone Encounter (Signed)
lmotmcb x1 for pt Is he with [facility]? Order was sent to [facility]

## 2014-11-27 NOTE — Telephone Encounter (Signed)
[phone] or [phone], no loger uses [facility] due to insurance, pt is using [facility]

## 2014-12-04 ENCOUNTER — Ambulatory Visit (INDEPENDENT_AMBULATORY_CARE_PROVIDER_SITE_OTHER): Payer: Commercial Managed Care - HMO | Admitting: *Deleted

## 2014-12-04 ENCOUNTER — Telehealth: Payer: Self-pay | Admitting: Internal Medicine

## 2014-12-04 DIAGNOSIS — Z7901 Long term (current) use of anticoagulants: Secondary | ICD-10-CM | POA: Diagnosis not present

## 2014-12-04 DIAGNOSIS — I4891 Unspecified atrial fibrillation: Secondary | ICD-10-CM | POA: Diagnosis not present

## 2014-12-04 DIAGNOSIS — Z5181 Encounter for therapeutic drug level monitoring: Secondary | ICD-10-CM

## 2014-12-04 LAB — POCT INR: INR: 1.8

## 2014-12-04 NOTE — Telephone Encounter (Signed)
Called spoke with patient, no need for [place] to have his chip.  Did encourage pt to check with [facility] to ensure they don't want it back - would hate for him to be charged if they like to have those returned.  Pt voiced his understanding and denied any questions/concerns.  Nothing further needed; will sign off.

## 2014-12-09 ENCOUNTER — Encounter: Payer: Self-pay | Admitting: Internal Medicine

## 2015-01-07 ENCOUNTER — Telehealth: Payer: Self-pay | Admitting: Internal Medicine

## 2015-01-07 NOTE — Telephone Encounter (Signed)
lmtcb x1 for pt. 

## 2015-01-08 NOTE — Telephone Encounter (Signed)
Called and spoke to pt. Pt stated he saw a commercial on TV that disinfects the CPAP. Advised pt to contact [facility] to see if they are able to offer that service and then call us back as to if he still wants to proceed or not. Will await call.

## 2015-01-08 NOTE — Telephone Encounter (Signed)
Pt got a call from [facility] stating that cy wanted him to get his cpap reprogramed he is not with [facility] anymore he is with [facility]  [phone]

## 2015-01-08 NOTE — Telephone Encounter (Signed)
Pt cb, [phone]

## 2015-01-08 NOTE — Telephone Encounter (Signed)
Called and LMTCB x1 

## 2015-01-08 NOTE — Telephone Encounter (Signed)
Spoke with pt, states that according to [place] he does not need his cpap adjusted currently.  They state that he does not need a cpap adjustment at this time.   Pt states that he called [facility] about the cleaning product for his cpap, but they had not heard about it.  States that it is called So Clean.  Pt states he will look further into this himself and call if he needs anything from [place] on our end.  Nothing further needed at this time.

## 2015-01-20 ENCOUNTER — Ambulatory Visit (INDEPENDENT_AMBULATORY_CARE_PROVIDER_SITE_OTHER): Payer: Commercial Managed Care - HMO | Admitting: Pharmacist

## 2015-01-20 DIAGNOSIS — Z7901 Long term (current) use of anticoagulants: Secondary | ICD-10-CM

## 2015-01-20 DIAGNOSIS — I4891 Unspecified atrial fibrillation: Secondary | ICD-10-CM | POA: Diagnosis not present

## 2015-01-20 DIAGNOSIS — Z5181 Encounter for therapeutic drug level monitoring: Secondary | ICD-10-CM | POA: Diagnosis not present

## 2015-01-20 LAB — POCT INR: INR: 2.1

## 2015-02-04 ENCOUNTER — Encounter: Payer: Self-pay | Admitting: Nurse Practitioner

## 2015-02-04 ENCOUNTER — Ambulatory Visit (INDEPENDENT_AMBULATORY_CARE_PROVIDER_SITE_OTHER): Payer: Commercial Managed Care - HMO | Admitting: Nurse Practitioner

## 2015-02-04 VITALS — BP 110/78 | HR 74 | Ht 70.0 in | Wt 181.0 lb

## 2015-02-04 DIAGNOSIS — I482 Chronic atrial fibrillation: Secondary | ICD-10-CM | POA: Diagnosis not present

## 2015-02-04 DIAGNOSIS — I4821 Permanent atrial fibrillation: Secondary | ICD-10-CM

## 2015-02-04 DIAGNOSIS — I442 Atrioventricular block, complete: Secondary | ICD-10-CM

## 2015-02-04 DIAGNOSIS — I493 Ventricular premature depolarization: Secondary | ICD-10-CM

## 2015-02-04 NOTE — Patient Instructions (Signed)
Medication Instructions:   Your physician recommends that you continue on your [place] medications as directed. Please refer to the [place] Medication list given to you today.    Labwork:  NONE ORDER TODAY  Testing/Procedures:  NONE ORDER TODAY  Follow-Up:  Remote monitoring is used to monitor your Pacemaker of ICD from home. This monitoring reduces the number of office visits required to check your device to one time per year. It allows [place] to keep an eye on the functioning of your device to ensure it is working properly. You are scheduled for a device check from home on . 11/ 2 / 16  You may send your transmission at any time that day. If you have a wireless device, the transmission will be sent automatically. After your physician reviews your transmission, you will receive a postcard with your next transmission date.  Your physician wants you to follow-up in: [place] will receive a reminder letter in the mail two months in advance. If you don't receive a letter, please call our office to schedule the follow-up appointment.      Any Other Special Instructions Will Be Listed Below (If Applicable).

## 2015-02-04 NOTE — Progress Notes (Signed)
Electrophysiology Office Note Date: 02/04/2015  ID:  [name], DOB 11-Sep-1929, MRN [id number]  PCP: Irven Shelling, MD Electrophysiologist: Rayann Heman  CC: Pacemaker follow-up  [name] is a 81 y.o. male seen today for Dr Rayann Heman.  He presents today for routine electrophysiology followup.  Since last being seen in our clinic, the patient reports doing very well.  He experienced a stress fracture doing Zumba but has since recovered and is back to exercising. He denies chest pain, palpitations, dyspnea, PND, orthopnea, nausea, vomiting, dizziness, syncope.  Device History: MDT dual chamber PPM implanted 1988 for complete heart block; gen change hx incomplete - gen change 2006 Dr Doreatha Lew, gen change with RV lead revision 2013 Dr Rayann Heman   Past Medical History  Diagnosis Date  . Allergic rhinitis   . RLS (restless legs syndrome)   . Permanent atrial fibrillation     on coumadin  . Esophageal reflux   . Bronchitis   . OSA (obstructive sleep apnea)   . Normal nuclear stress test 2013  . Transitional cell carcinoma     s/p nephrectomy in  2010  . Complete heart block     pacemaker originally placed in 1988  . BPH (benign prostatic hyperplasia)   . Lung nodule     noted on CXR December 2012  . Hypertension   . Dementia   . Pneumonia   . Renal insufficiency     Rt kidney removed d/t maglinant tumor  . Arthritis    Past Surgical History  Procedure Laterality Date  . Pacemaker insertion  1988    most recent generator change by Dr Rayann Heman 20067/2/13 with new right ventricular lead placed  . Nephrectomy  2010    rt  . Hernia repair      multiple  . Repair ankle ligament    . US echocardiography  04/22/2008    EF 55-60%  . Doppler echocardiography  03/26/2001    EF 55%  . Cardiovascular stress test  03/23/2009    EF 65%, NORMAL  . Eye surgery      tumor removed from Rt eye lid  . Mass excision Left 11/07/2012    Procedure: MINOR EXCISION OF MASS;  Surgeon:  Haywood Lasso, MD;  Location: [facility];  Service: General;  Laterality: Left;  . Permanent pacemaker generator change N/A 01/03/2012    Procedure: PERMANENT PACEMAKER GENERATOR CHANGE;  Surgeon: Thompson Grayer, MD;  Location: [facility] CATH LAB;  Service: Cardiovascular;  Laterality: N/A;    [facility] Prescriptions  Medication Sig Dispense Refill  . aspirin 81 MG tablet Take 81 mg by mouth daily.      Marland Kitchen atenolol (TENORMIN) 25 MG tablet Take 1 tablet (25 mg total) by mouth 2 (two) times daily. 180 tablet 1  . clonazePAM (KLONOPIN) 2 MG tablet Take 1 tablet (2 mg total) by mouth at bedtime. 90 tablet 3  . cycloSPORINE (RESTASIS) 0.05 % ophthalmic emulsion Place 1 drop into both eyes 2 (two) times daily.     Marland Kitchen doxazosin (CARDURA) 2 MG tablet Take 2 mg by mouth daily.    . finasteride (PROSCAR) 5 MG tablet Take 1 tablet (5 mg total) by mouth daily. 90 tablet 3  . furosemide (LASIX) 40 MG tablet Take 1 tablet (40 mg total) by mouth daily. 90 tablet 1  . galantamine (RAZADYNE) 8 MG tablet Take 8 mg by mouth 2 (two) times daily.    Marland Kitchen GLUCOSAMINE-CHONDROITIN PO Take 1 tablet by mouth  in the morning    . hydroxypropyl methylcellulose (ISOPTO TEARS) 2.5 % ophthalmic solution Place 1 drop into both eyes at bedtime.     Marland Kitchen ipratropium (ATROVENT) 0.06 % nasal spray Place 2 sprays into the nose 3 (three) times daily as needed for rhinitis. 127 mL 0  . Multiple Vitamin (MULTIVITAMIN) capsule Take 1 capsule by mouth daily.      Marland Kitchen omeprazole (PRILOSEC) 20 MG capsule Take 20 mg by mouth 2 (two) times daily before a meal.     . warfarin (COUMADIN) 5 MG tablet Take as directed per Anticoagulation Clinic 90 tablet 1   No [facility] facility-administered medications for this visit.    Allergies:   Review of patient's allergies indicates no known allergies.   Social History: History   Social History  . Marital Status: Married    Spouse Name: N/A  . Number of Children: N/A  . Years of  Education: N/A   Occupational History  . Retired Freight forwarder    Social History Main Topics  . Smoking status: Never Smoker   . Smokeless tobacco: Not on file  . Alcohol Use: No  . Drug Use: No  . Sexual Activity: Not on file   Other Topics Concern  . Not on file   Social History Narrative    Family History: Family History  Problem Relation Age of Onset  . Stroke Mother   . Stroke Father      Review of Systems: All other systems reviewed and are otherwise negative except as noted above.   Physical Exam: VS:  BP 110/78 mmHg  Pulse 74  Ht 5\' 10"  (1.778 m)  Wt 181 lb (82.101 kg)  BMI 25.97 kg/m2 , BMI Body mass index is 25.97 kg/(m^2).  GEN- The patient is well appearing, alert and oriented x 3 today.   HEENT: normocephalic, atraumatic; sclera clear, conjunctiva pink; hearing intact; oropharynx clear; neck supple, no JVP Lymph- no cervical lymphadenopathy Lungs- Clear to ausculation bilaterally, normal work of breathing.  No wheezes, rales, rhonchi Heart- Regular rate and rhythm (paced) GI- soft, non-tender, non-distended, bowel sounds present Extremities- no clubbing, cyanosis, or edema; DP/PT/radial pulses 2+ bilaterally MS- no significant deformity or atrophy Skin- warm and dry, no rash or lesion; right sided PPM pocket well healed, multiple macules Psych- euthymic mood, full affect Neuro- strength and sensation are intact  PPM Interrogation- reviewed in detail today,  See PACEART report  EKG:  EKG is ordered today. The ekg ordered today shows atrial fibrillation with ventricular pacing at 74  Recent Labs: 06/12/2014: BUN 41*; Creatinine, Ser 1.6*; Magnesium 2.2; Potassium 4.8; Sodium 135; TSH 0.31*   Wt Readings from Last 3 Encounters:  02/04/15 181 lb (82.101 kg)  07/28/14 181 lb 6.4 oz (82.283 kg)  06/09/14 174 lb 12.8 oz (79.289 kg)     Other studies Reviewed: Additional studies/ records that were reviewed today include: Dr Jackalyn Lombard office  notes, op notes  Assessment and Plan:  1.  Complete heart block Normal PPM function See Pace Art report No changes today  2.  Permanent atrial fibrillation Continue Warfarin for CHADS2VASC of at least 5 He has had previous TIA on Warfarin alone - on Warfarin and ASA per neurology CBC recently checked by PCP  3.  PVC's Burden low by device interrogation today Continue BB Echo 06/2014 demonstrated normal LV function Pt advised to call with symptomatic palpitations, dizziness   [place] medicines are reviewed at length with the patient today.   The patient  does not have concerns regarding his medicines.  The following changes were made today:  none  Labs/ tests ordered today include: none   Disposition:   Follow up with Carelink transmissions, follow up with Dr Rayann Heman 1 year   Signed, Chanetta Marshall, NP 02/04/2015 4:40 PM  [facility] [address] [place] [address] [phone] (office) [phone] (fax)

## 2015-02-05 LAB — CUP PACEART INCLINIC DEVICE CHECK
Lead Channel Setting Pacing Amplitude: 2.5 V
Lead Channel Setting Pacing Pulse Width: 0.4 ms
MDC IDC SESS DTM: 20160804164353
MDC IDC SET LEADCHNL RV SENSING SENSITIVITY: 4 mV

## 2015-02-16 ENCOUNTER — Ambulatory Visit (INDEPENDENT_AMBULATORY_CARE_PROVIDER_SITE_OTHER): Payer: Commercial Managed Care - HMO | Admitting: *Deleted

## 2015-02-16 DIAGNOSIS — Z5181 Encounter for therapeutic drug level monitoring: Secondary | ICD-10-CM

## 2015-02-16 DIAGNOSIS — I4891 Unspecified atrial fibrillation: Secondary | ICD-10-CM | POA: Diagnosis not present

## 2015-02-16 DIAGNOSIS — Z7901 Long term (current) use of anticoagulants: Secondary | ICD-10-CM | POA: Diagnosis not present

## 2015-02-16 LAB — POCT INR: INR: 2

## 2015-02-24 ENCOUNTER — Encounter: Payer: Self-pay | Admitting: Internal Medicine

## 2015-03-30 ENCOUNTER — Ambulatory Visit (INDEPENDENT_AMBULATORY_CARE_PROVIDER_SITE_OTHER): Payer: Commercial Managed Care - HMO | Admitting: Pharmacist

## 2015-03-30 DIAGNOSIS — I4891 Unspecified atrial fibrillation: Secondary | ICD-10-CM

## 2015-03-30 DIAGNOSIS — Z7901 Long term (current) use of anticoagulants: Secondary | ICD-10-CM

## 2015-03-30 DIAGNOSIS — Z5181 Encounter for therapeutic drug level monitoring: Secondary | ICD-10-CM

## 2015-03-30 LAB — POCT INR: INR: 1.8

## 2015-04-24 ENCOUNTER — Other Ambulatory Visit: Payer: Self-pay | Admitting: Internal Medicine

## 2015-04-27 ENCOUNTER — Encounter: Payer: Self-pay | Admitting: Internal Medicine

## 2015-04-27 ENCOUNTER — Ambulatory Visit (INDEPENDENT_AMBULATORY_CARE_PROVIDER_SITE_OTHER): Payer: Commercial Managed Care - HMO | Admitting: Internal Medicine

## 2015-04-27 VITALS — BP 108/62 | HR 78 | Ht 70.0 in | Wt 178.2 lb

## 2015-04-27 DIAGNOSIS — Z23 Encounter for immunization: Secondary | ICD-10-CM

## 2015-04-27 NOTE — Patient Instructions (Signed)
Flu vax  We can continue CPAP 7/ [facility] Romans

## 2015-04-27 NOTE — Progress Notes (Signed)
Subjective:    Patient ID: [name], male    DOB: 02-Oct-1929, 81 y.o.   MRN: [id number]  HPI 12/10/10- 67 yoM never smoker followed for OSA, hx Cheynes-Stokes, Restless legs, complicated by CM/ AFib/ brady-tachy/ pacer, allergic rhinitis, bronchitis, GERD. Hx right nephrectomy for cancer. Last here December 31, 2009 - note reviewed  CPAP continues to work fine, 7 cwp.  He likes his new machine.  Nortrpityline was given originally for depression and sleep. We discussed the possibility it was blurring his vision a little. He denies depression now.  He sleeps very well and is unaware of any limb movement disturbance now.   11/14/11- 81 yoM never smoker followed for OSA, hx Cheynes-Stokes, Restless legs, complicated by CM/ AFib/ brady-tachy/ pacer, allergic rhinitis, bronchitis, GERD. Hx right nephrectomy for cancer. Wears CPAP every night; States having Increased SOB as well-dizzy feelings as well; had pacemaker checked recently at cardiology and everything was normal. Describes episodes of feeling weak and dizzy and several situations, not always with exertion. Does think exercise tolerance has declined some. Insomnia with CPAP despite clonazepam 2 mg at bedtime. Sleep latency 2 hours. He thinks the main problem is arthritis and physical discomforts. PFT: 11/04/2011 normal spirometry with high normal lung volumes. FEV1/FVC 0.73. Insignificant response to bronchodilator. DLCO 118%. Room air oximetry with exercise: Ambulated several laps around our office today: 98% at rest with pulse 76. He did not drop below 97% saturation on room air and pulse rose no higher than 75. CT chest 10/28/11- images reviewed with him and his wife: IMPRESSION:  1. The plain film abnormality likely corresponds to a calcified  granuloma within the left upper lobe. There are other tiny  nodules, some of which are calcified. Given the history of  transitional cell carcinoma, follow-up with chest CT at 6 months  should be  considered.  2. No acute process in the chest. Cardiomegaly and small volume  pericardial effusion. The effusion is new since 06/14/2011.  3. Small volume perihepatic ascites. Incompletely imaged but felt  to be similar to 06/14/2011. Question fluid overload.  Original Report Authenticated By: [name], M.D.   05/15/12- 38 yoM never smoker followed for OSA, hx Cheynes-Stokes, Restless legs, complicated by CM/ AFib/ brady-tachy/ pacer, allergic rhinitis, bronchitis/ multiple granulomas, GERD. Hx right nephrectomy for cancer. Wears CPAP 7/ [facility] every night and presssure doing well; review 6MW with patient. Found out he needed new pacemaker-cause of SOB. Breathing is much better now. 6MWT 05/15/12- 98%, 98%, 100%, 435 m. No discomfort. Good distance with no oxygen limit.  05/20/13- 81 yoM never smoker followed for OSA, hx Cheynes-Stokes, Restless legs, complicated by CM/ AFib/ brady-tachy/ pacer, allergic rhinitis, bronchitis/ multiple granulomas, GERD. Hx right nephrectomy for cancer. FOLLOWS FOR: wearing CPAP every night for about 7 hours; pressure working well for patient. Would like to discuss changing DME([facility] is [facility]) New CPAP machine 7/ [facility]. Sleeps soundly. Restless legs are controlled.  05/26/14- 84 yoM never smoker followed for OSA, hx Cheynes-Stokes, Restless legs, complicated by CM/ AFib/ brady-tachy/ pacer, allergic rhinitis, bronchitis/ multiple granulomas, GERD. Hx right nephrectomy for cancer. FOLLOWS FOR: Wears CPAP 7/ [facility] every night for about 7-8 hours; pressure working well for patient; DME is [facility]. Recent problems with his pacemaker were addressed by increasing the pacing rate last week. Hopefully this will improve how he feels. Insurance change has kept him from his daily exercise at [employer]. CPAP download looks good at pressure of 7. AHI of 8.5 is  a little high but all events are central apneas. He is happy with CPAP "can't live without it", nasal  pillows. No restless legs and sleeping very well using clonazepam with no apparent morning carryover.  04/27/15- 84 yoM never smoker followed for OSA, hx Cheynes-Stokes, Restless legs, complicated by CM/ AFib/ brady-tachy/ pacer, allergic rhinitis, bronchitis/ multiple granulomas, GERD. Hx right nephrectomy for cancer. CPAP 9/ [facility] FOLLOWS FOR: Wears CPAP every night for about 8 hours; pressure works well for patient-DME is [facility]; no recent DL's.     ROS-see HPI Constitutional:   No-   weight loss, night sweats, fevers, chills, fatigue, lassitude. HEENT:   No-  headaches, difficulty swallowing, tooth/dental problems, sore throat,       No-  sneezing, itching, ear ache, nasal congestion, post nasal drip,  CV:  No-   chest pain, orthopnea, PND, swelling in lower extremities, anasarca, +dizziness, palpitations Resp: + shortness of breath with exertion or at rest.              No-   productive cough,  No non-productive cough,  No- coughing up of blood.              No-   change in color of mucus.  No- wheezing.   Skin: No-   rash or lesions. GI:  No-   heartburn, indigestion, abdominal pain, nausea, vomiting, GU:  MS:  No-   joint pain or swelling.   Neuro-     nothing unusual Psych:  No- change in mood or affect. No depression or anxiety.  No memory loss.  OBJ- Physical Exam General- Alert, Oriented, Affect-appropriate, Distress- none acute. +thin elderly Skin- rash-none, lesions- none, excoriation- none Lymphadenopathy- none Head- atraumatic            Eyes- Gross vision intact, PERRLA, conjunctivae and secretions clear            Ears- Hearing, canals-normal            Nose- Clear, no-Septal dev, mucus, polyps, erosion, perforation             Throat- Mallampati II , mucosa clear , drainage- none, tonsils- atrophic Neck- flexible , trachea midline, no stridor , thyroid nl, carotid no bruit Chest - symmetrical excursion , unlabored           Heart/CV- RRR , 2/6 SEM AS murmur , no  gallop  , no rub, nl s1 s2                           - JVD- none , edema- none, stasis changes- none, varices- none           Lung- clear to P&A, no rales, unlabored, wheeze- none, cough- none , dullness-none, rub- none           Chest wall- +R pacemaker Abd-  Br/ Gen/ Rectal- Not done, not indicated Extrem- cyanosis- none, clubbing, none, atrophy- none, strength- nl Neuro- grossly intact to observation  Assessment & Plan:

## 2015-05-04 ENCOUNTER — Ambulatory Visit (INDEPENDENT_AMBULATORY_CARE_PROVIDER_SITE_OTHER): Payer: Commercial Managed Care - HMO | Admitting: Pharmacist

## 2015-05-04 DIAGNOSIS — Z5181 Encounter for therapeutic drug level monitoring: Secondary | ICD-10-CM | POA: Diagnosis not present

## 2015-05-04 DIAGNOSIS — Z7901 Long term (current) use of anticoagulants: Secondary | ICD-10-CM | POA: Diagnosis not present

## 2015-05-04 DIAGNOSIS — I4891 Unspecified atrial fibrillation: Secondary | ICD-10-CM

## 2015-05-04 LAB — POCT INR: INR: 2.2

## 2015-05-05 ENCOUNTER — Telehealth: Payer: Self-pay | Admitting: *Deleted

## 2015-05-05 NOTE — Telephone Encounter (Signed)
Tiffany in coumadin clinic discovered pt had left his Carelink monitor behind. We called pt. He states he has old equipment that is working fine. According to MDT Carelink website, the [place] serial number in the system matches the unit Tiffany found. Pt does have a [place]. Pt states he is able to send remotes successfully w/ his old equipment. Since this is pt's 4th device, he states he does have a lot of equipment and does admit ppm tech continues to evolve. However, pt reiterated how he successfully transmitted in the past w/ previous equipment. I stated pt will need his new equipment. Pt did not understand his [place] adapter is not a remote monitor. I explained it is an adapter only, not a monitor.   Pt agreed to come to device clinic for a tutorial 05/06/15.

## 2015-05-06 ENCOUNTER — Ambulatory Visit (INDEPENDENT_AMBULATORY_CARE_PROVIDER_SITE_OTHER): Payer: Commercial Managed Care - HMO | Admitting: *Deleted

## 2015-05-06 DIAGNOSIS — I442 Atrioventricular block, complete: Secondary | ICD-10-CM

## 2015-05-07 NOTE — Progress Notes (Signed)
Remote pacemaker transmission.   

## 2015-05-08 ENCOUNTER — Telehealth: Payer: Self-pay | Admitting: Internal Medicine

## 2015-05-08 NOTE — Telephone Encounter (Signed)
He talked to his PCP nurse and gave her the ingredients that are in the medication and she said she did not see anything wrong with him taking.  I have asked him to discuss with [name], Pharm D next time he comes for his INR check and to bring his medications including that one with him

## 2015-05-08 NOTE — Telephone Encounter (Signed)
New message    Pt C/O medication issue:  1. Name of Medication:  conisense 500 mg   - pt looking at medication on Internet- no one prescribe  this medication - free medication  - no shipping   2. How are you currently taking this medication (dosage and times per day)? one capsule  Patient has not started this medication    3. Are you having a reaction (difficulty breathing--STAT)? Has not started this medication yet   4. What is your medication issue?  Diet supplement - suppose to make him feel better.

## 2015-05-21 LAB — CUP PACEART REMOTE DEVICE CHECK
Battery Impedance: 639 Ohm
Date Time Interrogation Session: 20161102115514
Implantable Lead Model: 5076
Lead Channel Impedance Value: 447 Ohm
Lead Channel Pacing Threshold Amplitude: 0.75 V
Lead Channel Pacing Threshold Pulse Width: 0.4 ms
Lead Channel Setting Pacing Pulse Width: 0.4 ms
Lead Channel Setting Sensing Sensitivity: 4 mV
MDC IDC LEAD IMPLANT DT: 20130702
MDC IDC LEAD LOCATION: 753860
MDC IDC MSMT BATTERY REMAINING LONGEVITY: 67 mo
MDC IDC MSMT BATTERY VOLTAGE: 2.76 V
MDC IDC SET LEADCHNL RV PACING AMPLITUDE: 2.5 V
MDC IDC STAT BRADY RV PERCENT PACED: 94 %

## 2015-05-22 ENCOUNTER — Encounter: Payer: Self-pay | Admitting: Cardiology

## 2015-05-23 ENCOUNTER — Other Ambulatory Visit: Payer: Self-pay | Admitting: Internal Medicine

## 2015-05-25 NOTE — Telephone Encounter (Signed)
CY Please advise on refill. Pt last seen 04/27/15 and pending OV for 04/28/16. Thanks.

## 2015-05-25 NOTE — Telephone Encounter (Signed)
Ok to refill 

## 2015-06-15 ENCOUNTER — Ambulatory Visit (INDEPENDENT_AMBULATORY_CARE_PROVIDER_SITE_OTHER): Payer: Commercial Managed Care - HMO | Admitting: *Deleted

## 2015-06-15 DIAGNOSIS — I4891 Unspecified atrial fibrillation: Secondary | ICD-10-CM | POA: Diagnosis not present

## 2015-06-15 DIAGNOSIS — Z5181 Encounter for therapeutic drug level monitoring: Secondary | ICD-10-CM

## 2015-06-15 DIAGNOSIS — Z7901 Long term (current) use of anticoagulants: Secondary | ICD-10-CM | POA: Diagnosis not present

## 2015-06-15 LAB — POCT INR: INR: 2.4

## 2015-06-17 ENCOUNTER — Telehealth: Payer: Self-pay | Admitting: Internal Medicine

## 2015-06-17 NOTE — Telephone Encounter (Signed)
New message  Pt c/o medication issue:  1. Name of Medication: Coumadin  4. What is your medication issue? Patient took two doses last night. He is requesting a call back to discuss how to regulate it.

## 2015-06-17 NOTE — Telephone Encounter (Signed)
Spoke with patient - will have him take 1/2 tablet on Thursday instead of 1 tablet then continue with his same dose. Pt expressed understanding.

## 2015-06-24 ENCOUNTER — Other Ambulatory Visit: Payer: Self-pay | Admitting: Internal Medicine

## 2015-06-29 ENCOUNTER — Other Ambulatory Visit: Payer: Self-pay | Admitting: Internal Medicine

## 2015-07-13 ENCOUNTER — Ambulatory Visit (INDEPENDENT_AMBULATORY_CARE_PROVIDER_SITE_OTHER): Payer: PPO

## 2015-07-13 DIAGNOSIS — Z7901 Long term (current) use of anticoagulants: Secondary | ICD-10-CM

## 2015-07-13 DIAGNOSIS — Z5181 Encounter for therapeutic drug level monitoring: Secondary | ICD-10-CM

## 2015-07-13 DIAGNOSIS — I4891 Unspecified atrial fibrillation: Secondary | ICD-10-CM | POA: Diagnosis not present

## 2015-07-13 LAB — POCT INR: INR: 1.8

## 2015-07-30 DIAGNOSIS — L814 Other melanin hyperpigmentation: Secondary | ICD-10-CM | POA: Diagnosis not present

## 2015-07-30 DIAGNOSIS — D2272 Melanocytic nevi of left lower limb, including hip: Secondary | ICD-10-CM | POA: Diagnosis not present

## 2015-07-30 DIAGNOSIS — D225 Melanocytic nevi of trunk: Secondary | ICD-10-CM | POA: Diagnosis not present

## 2015-07-30 DIAGNOSIS — I8312 Varicose veins of left lower extremity with inflammation: Secondary | ICD-10-CM | POA: Diagnosis not present

## 2015-07-30 DIAGNOSIS — Z85828 Personal history of other malignant neoplasm of skin: Secondary | ICD-10-CM | POA: Diagnosis not present

## 2015-07-30 DIAGNOSIS — L821 Other seborrheic keratosis: Secondary | ICD-10-CM | POA: Diagnosis not present

## 2015-07-30 DIAGNOSIS — L570 Actinic keratosis: Secondary | ICD-10-CM | POA: Diagnosis not present

## 2015-07-30 DIAGNOSIS — D1801 Hemangioma of skin and subcutaneous tissue: Secondary | ICD-10-CM | POA: Diagnosis not present

## 2015-07-30 DIAGNOSIS — I872 Venous insufficiency (chronic) (peripheral): Secondary | ICD-10-CM | POA: Diagnosis not present

## 2015-07-30 DIAGNOSIS — I8311 Varicose veins of right lower extremity with inflammation: Secondary | ICD-10-CM | POA: Diagnosis not present

## 2015-08-05 ENCOUNTER — Telehealth: Payer: Self-pay | Admitting: Cardiology

## 2015-08-05 ENCOUNTER — Telehealth: Payer: Self-pay | Admitting: Internal Medicine

## 2015-08-05 ENCOUNTER — Encounter: Payer: PPO | Admitting: *Deleted

## 2015-08-05 NOTE — Telephone Encounter (Signed)
New message      Calling to let device clinic know that his remote box is not working.  The company is sending him a new box.  It will be several days/weeks before he can transmit.

## 2015-08-05 NOTE — Telephone Encounter (Signed)
Noted on the schedule

## 2015-08-05 NOTE — Telephone Encounter (Signed)
Spoke with pt and reminded pt of remote transmission that is due today. Pt verbalized understanding.   

## 2015-08-10 ENCOUNTER — Encounter: Payer: Self-pay | Admitting: Cardiology

## 2015-08-10 ENCOUNTER — Telehealth: Payer: Self-pay | Admitting: Internal Medicine

## 2015-08-10 ENCOUNTER — Ambulatory Visit (INDEPENDENT_AMBULATORY_CARE_PROVIDER_SITE_OTHER): Payer: PPO

## 2015-08-10 DIAGNOSIS — Z5181 Encounter for therapeutic drug level monitoring: Secondary | ICD-10-CM

## 2015-08-10 DIAGNOSIS — I4891 Unspecified atrial fibrillation: Secondary | ICD-10-CM

## 2015-08-10 DIAGNOSIS — Z7901 Long term (current) use of anticoagulants: Secondary | ICD-10-CM | POA: Diagnosis not present

## 2015-08-10 LAB — POCT INR: INR: 2

## 2015-08-10 MED ORDER — FUROSEMIDE 40 MG PO TABS: 40.0000 mg | ORAL_TABLET | Freq: Every day | ORAL | Status: DC

## 2015-08-10 NOTE — Telephone Encounter (Signed)
Pt last had refill on clonazepam 2 mg on 05/25/15 #90 x 1 refill Sig: TAKE 1 TABLET AT BEDTIME FOR SLEEP AS NEEDED Pt [facility] is [facility] for [facility] day supply.  Pt only takes 1 po qhs prn. Please advise Dr. Annamaria Boots thanks

## 2015-08-10 NOTE — Telephone Encounter (Signed)
Ok to refill 

## 2015-08-11 MED ORDER — CLONAZEPAM 2 MG PO TABS: ORAL_TABLET | ORAL | Status: DC

## 2015-08-11 NOTE — Telephone Encounter (Signed)
1/2 or one 2mg  tab at bedtime if needed, # 90, ref x 1

## 2015-08-11 NOTE — Telephone Encounter (Signed)
rx printed, signed, and faxed to [facility] at below verified fax #.   lmtcb X1 for pt to make aware.

## 2015-08-11 NOTE — Telephone Encounter (Signed)
CY please clarify- ok to refill as previously prescribed (1 tab qhs prn), or as requested (2 tabs tid) Thanks!

## 2015-08-12 NOTE — Telephone Encounter (Signed)
Called and spoke with patient. Informed him that Rx was sent to pharmacy. He voiced understanding and had no further questions. Nothing further needed.

## 2015-08-14 ENCOUNTER — Ambulatory Visit (INDEPENDENT_AMBULATORY_CARE_PROVIDER_SITE_OTHER): Payer: PPO | Admitting: *Deleted

## 2015-08-14 DIAGNOSIS — I442 Atrioventricular block, complete: Secondary | ICD-10-CM

## 2015-08-17 NOTE — Progress Notes (Signed)
Remote pacemaker transmission.   

## 2015-08-19 ENCOUNTER — Other Ambulatory Visit: Payer: Self-pay | Admitting: Ophthalmology

## 2015-08-19 NOTE — H&P (Signed)
History & Physical:   DATE:   08-11-2015  NAME:  [name], [name]     VB:[id number]       HISTORY OF PRESENT ILLNESS: Referred by [facility] Complaints blurry vision  & cloudy OS I can't read with OS  PO CE w IOL OD. Va seems to have improved since surgery. No pain in OU.  Patient c/o having a decrease in vision out of OS   HPI: EYES: Reports symptoms of worse OS       LOCATION:      QUALITY/COURSE:   Reports condition is worsening.        INTENSITY/SEVERITY:    Reports measurement ( or degree) as moderate.      DURATION:   Reports the general length of symptoms to be months.                  ACTIVE PROBLEMS: Vitreous degeneration, right eye   ICD10: H43.811  ICD9:    Meibomian gland dysfunction   ICD10: H02.89 ICD9: 375.15    EIOP OU  Pseudophakia   ICD10: Z96.1 ICD9: 446.0  Essential hypertension    ICD10: I10 Initial Date:  ICD9: 401.1  Keratoconjunctivitis sicca, not specified as Sjogren's, bilateral   ICD10: H16.223  ICD9:   Hypertensive retinopathy   ICD10: H35.039  ICD9: 362.11  Onset: 05/19/2015 14:39  Initial Date:    Age-related nuclear cataract, left eye   ICD10: H25.12  ICD9:   Onset:   Initial Date:  SURGERIES: 06/08/15 CE w IOL OD  radiation on RL removal of tumor Pacemaker x3  Tumor RLL 1960s Kidney Removed Right hypernephroma  Pick List - Surgeries  MEDICATIONS:   Coumadin (Warfarin):   5 mg tablet  SIG-   as directed   once a day  Sunday Tues, Thurs  Coumadin (Warfarin):   2.5 mg tablet  SIG-   as directed   once a day  M/W/F  Proscar (Finasteride):   5 mg tablet  SIG-  1 tab(s)   once a day   Cardura (Doxazosin):    2 mg tablet    SIG-   1 each     once a day              Clonazepam (Klonopin):   2 mg tablet  SIG-  1 each   3 times a day   Aspirin:  81 mg tablet  SIG-  1 each   once a day   Multivitamin: Strength-  SIG-  Dose-  Freq-    Atenolol (Tenormin):   25 mg tablet  SIG-  1 each   once a day   Furosemide  (Lasix):   20 mg tablet  SIG-  1 each    in the AM    Galantamine Hydrobromide ER: 8 mg capsule, extended release SIG-  Dose-  Freq-    Alphagan P: 0.1% solution SIG-  1 drop in Right Eye Twice daily   Restasis: 0.05% emulsion SIG-  1 gtt BID OU  Pred Forte: Strength-  SIG-    Ciloxan (Ciprofloxacin) Solution: 0.3% solution SIG-  2 drop(s)  every 4 hours    Acular (Ketorlac Tromethamine):   0.5% solution  SIG-  2 drop(s)   4 times a day  REVIEW OF SYSTEMS: ROS:   GEN- Constitutional: HENT: GEN - Endocrine: Reports  symptoms of LUNGS/Respiratory:  HEART/Cardiovascular: Reports symptoms of hypertension.    ABD/Gastrointestinal:   Musculoskeletal (BJE): NEURO/Neurological: PSYCH/Psychiatric:    Is the pt oriented to time, place, person? yes  Mood normal     TOBACCO:Smoker Status:   Never smoker   ICD10: Z87.898 ICD9: V13.89 Onset: 05/19/2015 14:41  SOCIAL HISTORY: Starter Pick List - Social History  FAMILY HISTORY: Family History - 1st Degree Relatives:  Son alive and well.  ALLERGIES: Drug Allergies.  No Known.   Starter - Allergies - Summary:  PHYSICAL EXAMINATION: VS: BMI: 25.2.  BP: 134/69.  H: 70.00 in.  P: 72 /min.  W: 175lbs 0oz.    Va  08/11/2015 14:09  OD [place] 20/25-2  Ph 20/20   OS cc 20/25  glare test  OS cc 20/30+1  EYEGLASSES:  OD -1.75 +0.75 X015         OS -1.50 +1.25 X122 ADD: +2.75  MR  08/11/2015 14:11  OD -2.00 +1.50 x035 20/20-1 OS -1.50 +1.25 x122 20/25+2 ADD  +2.75  K's OD:  43.25   axis 112       43.50    axis 022 OS:  43.25   axis 015       44.00    axis 105  VF: FULL IN ALL FOUR QUADRANTS   OU  Motility   PUPILS: 1mm reactive -mg   EYELIDS & OCULAR ADNEXA loss of lashes RL & clogged glands ,   SLE: Conjunctiva inclusion cyst in conj OS,  pinguecula    Cornea arcus 2-3,INFERIOR STAINING &   Decreased Tear Break-Up Time  OU    anterior chamber  :  deep and quiet   OD   Iris brown   Lens     posterior chamber  intraocular  lens implant  OD good position,   2-3  nuclear  sclerosis  , 2+ cortical OS  Vitreous posterior vitreous detachment   OD   CCT01/24/2017 16:19  OD554 OS 557  Ta   in mmHg    OD  20        OS 20 Time  08/11/2015 15:43   Gonio   Dilation        Fundus: difficult view OD   optic nerve  OD 35% cup TEMPORAL DISCOLORATION                                                                    OS 35% cup    Macula      OD difficult view, flat  , dull light reflex  , no edema                                                 OS dull light reflex   Vessels narrow arterioles   Periphery flat and detached OD  normal OS     Exam: GENERAL: Appearance: HEAD, EARS, NOSE AND THROAT: Ears-Nose (external) Inspection: Externally, nose and ears are normal in appearance and without scars, lesions, or nodules.      Hearing assessment shows no problems with normal conversation.      LUNGS and  RESPIRATORY: Lung auscultation elicits no wheezing, rhonci, rales or rubs and with equal breath sounds.    Respiratory effort described as breathing is unlabored and chest movement is symmetrical.    HEART (Cardiovascular): Heart auscultation discovers  grade III/IV murmur  ABDOMEN (Gastrointestinal): Mass/Tenderness Exam: Neither are present.     MUSCULOSKELETAL (BJE): Inspection-Palpation: No major bone, joint, tendon, or muscle changes.      NEUROLOGICAL: Alert and oriented. No major deficits of coordination or sensation.      PSYCHIATRIC: Insight and judgment appear  both to be intact and appropriate.    Mood and affect are described as normal mood and full affect.    SKIN: Skin Inspection: No rashes or lesions  ADMITTING DIAGNOSIS Age-related nuclear cataract, left eye   ICD10: H25.12  ICD9:   Onset:   Initial Date: Vitreous degeneration, right eye   ICD10: H43.811  ICD9:    Meibomian gland dysfunction   ICD10: H02.89 ICD9: 375.15    EIOP OU  Pseudophakia   ICD10: Z96.1   Essential hypertension    ICD10:  I10   Keratoconjunctivitis sicca, not specified as Sjogren's, bilateral   ICD10: H16.223   Hypertensive retinopathy   ICD10: H35.039  ICD9: 362.11  Onset: 05/19/2015 14:39   SURGICAL TREATMENT PLAN: if VA stable OD consider  phaco emulsion cataract extraction   OS    phaco emulsion cataract extraction  w  intraocular lens implant  OS  Risk and benefits of surgery have been reviewed with the patient and the patient agrees to proceed with the surgical procedure.     ___________________________ Marylynn Pearson, Lauris Chroman - Inactive Problems:

## 2015-08-20 DIAGNOSIS — G4733 Obstructive sleep apnea (adult) (pediatric): Secondary | ICD-10-CM | POA: Diagnosis not present

## 2015-08-24 ENCOUNTER — Other Ambulatory Visit: Payer: Self-pay | Admitting: *Deleted

## 2015-08-24 ENCOUNTER — Telehealth: Payer: Self-pay | Admitting: Internal Medicine

## 2015-08-24 ENCOUNTER — Encounter (HOSPITAL_COMMUNITY): Payer: Self-pay | Admitting: *Deleted

## 2015-08-24 MED ORDER — FUROSEMIDE 40 MG PO TABS: 40.0000 mg | ORAL_TABLET | Freq: Every day | ORAL | Status: DC

## 2015-08-24 NOTE — Telephone Encounter (Signed)
Rx sent 

## 2015-08-24 NOTE — Progress Notes (Signed)
Pt has history of A-fib and has a pacemaker. His cardiologist is Dr. Rayann Heman. Pt denies chest pain or sob. Pt is on Coumadin and was instructed by Dr. Venetia Maxon to continue to take medication.

## 2015-08-24 NOTE — Telephone Encounter (Signed)
Follow up   *STAT* If patient is at the pharmacy, call can be transferred to refill team.   1. Which medications need to be refilled? (please list name of each medication and dose if known) FURISEMIDE 40MG    2. Which pharmacy/location (including street and city if local pharmacy) is medication to be sent to? [place] [address]  3. Do they need a 30 day or 90 day supply? [place]

## 2015-08-24 NOTE — Progress Notes (Signed)
Anesthesia Chart Review:  Pt is an 82 year old male scheduled for L cataract extraction phaco and intraocular lens placement on 08/26/2015 with Dr. Venetia Maxon.   Pt is a same day work up.   Pulmonologist is Dr. Baird Lyons. EP cardiologist is Dr. Thompson Grayer. PCP is Dr. [name].   PMH includes:  Complete heart block, pacemaker, HTN, atrial fibrillation, TIA, OSA (on CPAP), renal insufficiency (s/p nephrectomy for cancer), dementia. Never smoker. BMI 25.5.   Medications include: ASA, atenolol, doxazosin, lasix, atrovent, coumadin. Pt will be continuing coumadin perioperatively  Labs will be obtained DOS.   EKG 02/04/15: electronic ventricular pacemaker.   Echo 06/12/14:  - Left ventricle: The cavity size was normal. Wall thickness was increased in a pattern of mild LVH. There was mild focal basal hypertrophy of the septum. Systolic function was normal. The estimated ejection fraction was in the range of 55% to 60%. Wall motion was normal; there were no regional wall motion abnormalities. - Aortic valve: Cusp separation was reduced. Valve mobility was restricted. There was mild stenosis. There was trivial regurgitation. - Mitral valve: Calcified annulus. Mildly thickened leaflets. There was mild regurgitation. - Left atrium: The atrium was severely dilated. - Right ventricle: The cavity size was mildly dilated. Pacer wire or catheter noted in right ventricle. - Right atrium: The atrium was severely dilated. Pacer wire or catheter noted in right atrium. - Pulmonary arteries: Systolic pressure was moderately increased. PA peak pressure: 48 mm Hg (S). - Pericardium, extracardiac: A small pericardial effusion was identified. - Impressions: Normal LV function; severe biatrial enlargement; mild RVE; heavily calcified aortic valve with reduced cusp excursion; mild AS by doppler with mean gradient 10 mmHg; trace AI; mild MR and TR; moderately elevated pulmonary pressures, small pericardial  effusion.  Nuclear stress test 11/07/11: Normal stress nuclear study. LV Ejection Fraction: 56%. LV Wall Motion: Normal thickening. Septal hypokinesis.  If labs acceptable DOS, I anticipate pt can proceed with surgery as scheduled.   Willeen Cass, FNP-BC [facility]/Anesthesiology Phone: [phone] 08/24/2015 1:46 PM

## 2015-08-25 MED ORDER — TETRACAINE HCL 0.5 % OP SOLN
1.0000 [drp] | OPHTHALMIC | Status: DC
  Filled 2015-08-25 (×2): qty 2

## 2015-08-25 MED ORDER — CYCLOPENTOLATE HCL 1 % OP SOLN: 1.0000 [drp] | OPHTHALMIC | Status: DC

## 2015-08-25 MED ORDER — GATIFLOXACIN 0.5 % OP SOLN: 1.0000 [drp] | OPHTHALMIC | Status: DC

## 2015-08-25 MED ORDER — PHENYLEPHRINE HCL 2.5 % OP SOLN: 1.0000 [drp] | OPHTHALMIC | Status: DC

## 2015-08-25 MED ORDER — TROPICAMIDE 1 % OP SOLN: 1.0000 [drp] | OPHTHALMIC | Status: DC

## 2015-08-25 MED ORDER — KETOROLAC TROMETHAMINE 0.5 % OP SOLN: 1.0000 [drp] | OPHTHALMIC | Status: DC

## 2015-08-26 ENCOUNTER — Encounter (HOSPITAL_COMMUNITY)
Admission: RE | Disposition: A | Discharge status: Home / Self Care | Payer: Self-pay | Source: Ambulatory Visit | Attending: Ophthalmology

## 2015-08-26 ENCOUNTER — Ambulatory Visit (HOSPITAL_COMMUNITY): Payer: No Typology Code available for payment source | Admitting: Emergency Medicine

## 2015-08-26 ENCOUNTER — Ambulatory Visit (HOSPITAL_COMMUNITY)
Admission: RE | Admit: 2015-08-26 | Discharge: 2015-08-26 | Disposition: A | Discharge status: Home / Self Care | Payer: No Typology Code available for payment source | Source: Ambulatory Visit | Attending: Ophthalmology | Admitting: Ophthalmology

## 2015-08-26 ENCOUNTER — Encounter (HOSPITAL_COMMUNITY): Payer: Self-pay | Admitting: *Deleted

## 2015-08-26 DIAGNOSIS — K219 Gastro-esophageal reflux disease without esophagitis: Secondary | ICD-10-CM | POA: Insufficient documentation

## 2015-08-26 DIAGNOSIS — Z85528 Personal history of other malignant neoplasm of kidney: Secondary | ICD-10-CM | POA: Diagnosis not present

## 2015-08-26 DIAGNOSIS — Z7901 Long term (current) use of anticoagulants: Secondary | ICD-10-CM | POA: Diagnosis not present

## 2015-08-26 DIAGNOSIS — Z950 Presence of cardiac pacemaker: Secondary | ICD-10-CM | POA: Insufficient documentation

## 2015-08-26 DIAGNOSIS — H2512 Age-related nuclear cataract, left eye: Secondary | ICD-10-CM | POA: Diagnosis not present

## 2015-08-26 DIAGNOSIS — G4733 Obstructive sleep apnea (adult) (pediatric): Secondary | ICD-10-CM | POA: Diagnosis not present

## 2015-08-26 DIAGNOSIS — M1990 Unspecified osteoarthritis, unspecified site: Secondary | ICD-10-CM | POA: Diagnosis not present

## 2015-08-26 DIAGNOSIS — H43811 Vitreous degeneration, right eye: Secondary | ICD-10-CM | POA: Diagnosis not present

## 2015-08-26 DIAGNOSIS — Z961 Presence of intraocular lens: Secondary | ICD-10-CM | POA: Diagnosis not present

## 2015-08-26 DIAGNOSIS — H35039 Hypertensive retinopathy, unspecified eye: Secondary | ICD-10-CM | POA: Diagnosis not present

## 2015-08-26 DIAGNOSIS — Z7982 Long term (current) use of aspirin: Secondary | ICD-10-CM | POA: Insufficient documentation

## 2015-08-26 DIAGNOSIS — H25812 Combined forms of age-related cataract, left eye: Secondary | ICD-10-CM | POA: Diagnosis not present

## 2015-08-26 DIAGNOSIS — I10 Essential (primary) hypertension: Secondary | ICD-10-CM | POA: Diagnosis not present

## 2015-08-26 DIAGNOSIS — F0390 Unspecified dementia without behavioral disturbance: Secondary | ICD-10-CM | POA: Diagnosis not present

## 2015-08-26 DIAGNOSIS — Z8673 Personal history of transient ischemic attack (TIA), and cerebral infarction without residual deficits: Secondary | ICD-10-CM | POA: Diagnosis not present

## 2015-08-26 DIAGNOSIS — Z79899 Other long term (current) drug therapy: Secondary | ICD-10-CM | POA: Diagnosis not present

## 2015-08-26 DIAGNOSIS — H0289 Other specified disorders of eyelid: Secondary | ICD-10-CM | POA: Diagnosis not present

## 2015-08-26 DIAGNOSIS — Z905 Acquired absence of kidney: Secondary | ICD-10-CM | POA: Insufficient documentation

## 2015-08-26 DIAGNOSIS — I4891 Unspecified atrial fibrillation: Secondary | ICD-10-CM | POA: Diagnosis not present

## 2015-08-26 DIAGNOSIS — H269 Unspecified cataract: Secondary | ICD-10-CM | POA: Diagnosis present

## 2015-08-26 DIAGNOSIS — H16223 Keratoconjunctivitis sicca, not specified as Sjogren's, bilateral: Secondary | ICD-10-CM | POA: Insufficient documentation

## 2015-08-26 DIAGNOSIS — H259 Unspecified age-related cataract: Secondary | ICD-10-CM | POA: Diagnosis not present

## 2015-08-26 HISTORY — DX: Presence of cardiac pacemaker: Z95.0

## 2015-08-26 HISTORY — DX: Cerebral infarction, unspecified: I63.9

## 2015-08-26 HISTORY — PX: CATARACT EXTRACTION W/PHACO: SHX586

## 2015-08-26 LAB — CBC
HCT: 38.8 % — ABNORMAL LOW (ref 39.0–52.0)
HEMOGLOBIN: 12.6 g/dL — AB (ref 13.0–17.0)
MCH: 32.6 pg (ref 26.0–34.0)
MCHC: 32.5 g/dL (ref 30.0–36.0)
MCV: 100.5 fL — ABNORMAL HIGH (ref 78.0–100.0)
Platelets: 150 10*3/uL (ref 150–400)
RBC: 3.86 MIL/uL — AB (ref 4.22–5.81)
RDW: 13.2 % (ref 11.5–15.5)
WBC: 4.2 10*3/uL (ref 4.0–10.5)

## 2015-08-26 LAB — BASIC METABOLIC PANEL
Anion gap: 9 (ref 5–15)
BUN: 24 mg/dL — AB (ref 6–20)
CHLORIDE: 109 mmol/L (ref 101–111)
CO2: 25 mmol/L (ref 22–32)
Calcium: 9.3 mg/dL (ref 8.9–10.3)
Creatinine, Ser: 1.48 mg/dL — ABNORMAL HIGH (ref 0.61–1.24)
GFR calc Af Amer: 49 mL/min — ABNORMAL LOW (ref >60)
GFR calc non Af Amer: 42 mL/min — ABNORMAL LOW (ref >60)
GLUCOSE: 100 mg/dL — AB (ref 65–99)
POTASSIUM: 4.5 mmol/L (ref 3.5–5.1)
SODIUM: 143 mmol/L (ref 135–145)

## 2015-08-26 LAB — PROTIME-INR
INR: 2.36 — ABNORMAL HIGH (ref 0.00–1.49)
PROTHROMBIN TIME: 25.5 s — AB (ref 11.6–15.2)

## 2015-08-26 SURGERY — PHACOEMULSIFICATION, CATARACT, WITH IOL INSERTION
Anesthesia: Monitor Anesthesia Care | Site: Eye | Laterality: Left

## 2015-08-26 MED ORDER — SODIUM CHLORIDE 0.9 % IV SOLN
INTRAVENOUS | Status: DC | PRN
  Administered 2015-08-26: 10:00:00 via INTRAVENOUS

## 2015-08-26 MED ORDER — PROPOFOL 10 MG/ML IV BOLUS
INTRAVENOUS | Status: AC
  Filled 2015-08-26: qty 20

## 2015-08-26 MED ORDER — NA CHONDROIT SULF-NA HYALURON 40-30 MG/ML IO SOLN
INTRAOCULAR | Status: DC | PRN
  Administered 2015-08-26: 0.5 mL via INTRAOCULAR

## 2015-08-26 MED ORDER — TETRACAINE HCL 0.5 % OP SOLN
OPHTHALMIC | Status: AC
  Filled 2015-08-26: qty 2

## 2015-08-26 MED ORDER — GENTAMICIN SULFATE 40 MG/ML IJ SOLN
INTRAMUSCULAR | Status: AC
  Filled 2015-08-26: qty 2

## 2015-08-26 MED ORDER — LIDOCAINE-EPINEPHRINE 2 %-1:100000 IJ SOLN
INTRAMUSCULAR | Status: DC | PRN
  Administered 2015-08-26: 4.5 mL via RETROBULBAR

## 2015-08-26 MED ORDER — GATIFLOXACIN 0.5 % OP SOLN
OPHTHALMIC | Status: AC
  Administered 2015-08-26: 1 [drp]
  Filled 2015-08-26: qty 2.5

## 2015-08-26 MED ORDER — SODIUM CHLORIDE 0.9 % IV SOLN: INTRAVENOUS | Status: DC

## 2015-08-26 MED ORDER — DEXAMETHASONE SODIUM PHOSPHATE 10 MG/ML IJ SOLN
INTRAMUSCULAR | Status: AC
  Filled 2015-08-26: qty 1

## 2015-08-26 MED ORDER — PROPOFOL 10 MG/ML IV BOLUS
INTRAVENOUS | Status: DC | PRN
  Administered 2015-08-26: 10 mg via INTRAVENOUS

## 2015-08-26 MED ORDER — CYCLOPENTOLATE HCL 1 % OP SOLN
1.0000 [drp] | OPHTHALMIC | Status: AC
  Administered 2015-08-26 (×2): 1 [drp] via OPHTHALMIC

## 2015-08-26 MED ORDER — EPINEPHRINE HCL 1 MG/ML IJ SOLN
INTRAOCULAR | Status: DC | PRN
  Administered 2015-08-26: 11:00:00

## 2015-08-26 MED ORDER — PILOCARPINE HCL 4 % OP SOLN
OPHTHALMIC | Status: AC
  Filled 2015-08-26: qty 15

## 2015-08-26 MED ORDER — LIDOCAINE HCL 2 % IJ SOLN
INTRAMUSCULAR | Status: AC
  Filled 2015-08-26: qty 20

## 2015-08-26 MED ORDER — BUPIVACAINE HCL (PF) 0.75 % IJ SOLN
INTRAMUSCULAR | Status: AC
  Filled 2015-08-26: qty 10

## 2015-08-26 MED ORDER — PHENYLEPHRINE HCL 2.5 % OP SOLN
1.0000 [drp] | OPHTHALMIC | Status: AC
  Administered 2015-08-26 (×2): 1 [drp] via OPHTHALMIC

## 2015-08-26 MED ORDER — GATIFLOXACIN 0.5 % OP SOLN
1.0000 [drp] | OPHTHALMIC | Status: AC
  Administered 2015-08-26 (×2): 1 [drp] via OPHTHALMIC

## 2015-08-26 MED ORDER — ACETAMINOPHEN 325 MG PO TABS: 650.0000 mg | ORAL_TABLET | ORAL | Status: DC | PRN

## 2015-08-26 MED ORDER — NA CHONDROIT SULF-NA HYALURON 40-30 MG/ML IO SOLN
INTRAOCULAR | Status: AC
  Filled 2015-08-26: qty 0.5

## 2015-08-26 MED ORDER — TROPICAMIDE 1 % OP SOLN
OPHTHALMIC | Status: AC
  Administered 2015-08-26: 1 [drp]
  Filled 2015-08-26: qty 3

## 2015-08-26 MED ORDER — HYALURONIDASE HUMAN 150 UNIT/ML IJ SOLN
INTRAMUSCULAR | Status: AC
  Filled 2015-08-26: qty 1

## 2015-08-26 MED ORDER — SODIUM HYALURONATE 10 MG/ML IO SOLN
INTRAOCULAR | Status: AC
  Filled 2015-08-26: qty 0.85

## 2015-08-26 MED ORDER — BSS IO SOLN
INTRAOCULAR | Status: AC
  Filled 2015-08-26: qty 500

## 2015-08-26 MED ORDER — EPINEPHRINE HCL 1 MG/ML IJ SOLN
INTRAMUSCULAR | Status: AC
  Filled 2015-08-26: qty 1

## 2015-08-26 MED ORDER — ACETYLCHOLINE CHLORIDE 20 MG IO SOLR
INTRAOCULAR | Status: DC | PRN
  Administered 2015-08-26: 10 mg via INTRAOCULAR

## 2015-08-26 MED ORDER — CYCLOPENTOLATE HCL 1 % OP SOLN
OPHTHALMIC | Status: AC
  Administered 2015-08-26: 10:00:00
  Filled 2015-08-26: qty 2

## 2015-08-26 MED ORDER — ACETYLCHOLINE CHLORIDE 20 MG IO SOLR
INTRAOCULAR | Status: AC
  Filled 2015-08-26: qty 1

## 2015-08-26 MED ORDER — KETOROLAC TROMETHAMINE 0.5 % OP SOLN
1.0000 [drp] | OPHTHALMIC | Status: AC
  Administered 2015-08-26 (×2): 1 [drp] via OPHTHALMIC

## 2015-08-26 MED ORDER — LIDOCAINE-EPINEPHRINE 2 %-1:100000 IJ SOLN
INTRAMUSCULAR | Status: AC
  Filled 2015-08-26: qty 1

## 2015-08-26 MED ORDER — SODIUM HYALURONATE 10 MG/ML IO SOLN
INTRAOCULAR | Status: DC | PRN
  Administered 2015-08-26: 0.85 mL via INTRAOCULAR

## 2015-08-26 MED ORDER — TOBRAMYCIN-DEXAMETHASONE 0.3-0.1 % OP OINT
TOPICAL_OINTMENT | OPHTHALMIC | Status: AC
  Filled 2015-08-26: qty 3.5

## 2015-08-26 MED ORDER — 0.9 % SODIUM CHLORIDE (POUR BTL) OPTIME
TOPICAL | Status: DC | PRN
  Administered 2015-08-26: 100 mL

## 2015-08-26 MED ORDER — BSS IO SOLN
INTRAOCULAR | Status: AC
  Filled 2015-08-26: qty 15

## 2015-08-26 MED ORDER — TOBRAMYCIN 0.3 % OP OINT
TOPICAL_OINTMENT | OPHTHALMIC | Status: DC | PRN
  Administered 2015-08-26: 1 via OPHTHALMIC

## 2015-08-26 MED ORDER — KETOROLAC TROMETHAMINE 0.5 % OP SOLN
OPHTHALMIC | Status: AC
  Administered 2015-08-26: 10:00:00
  Filled 2015-08-26: qty 5

## 2015-08-26 MED ORDER — PROMETHAZINE HCL 25 MG/ML IJ SOLN: 6.2500 mg | INTRAMUSCULAR | Status: DC | PRN

## 2015-08-26 MED ORDER — TROPICAMIDE 1 % OP SOLN
1.0000 [drp] | OPHTHALMIC | Status: AC
  Administered 2015-08-26 (×2): 1 [drp] via OPHTHALMIC

## 2015-08-26 MED ORDER — PHENYLEPHRINE HCL 2.5 % OP SOLN
OPHTHALMIC | Status: AC
  Administered 2015-08-26: 1 [drp]
  Filled 2015-08-26: qty 2

## 2015-08-26 MED ORDER — FENTANYL CITRATE (PF) 100 MCG/2ML IJ SOLN: 25.0000 ug | INTRAMUSCULAR | Status: DC | PRN

## 2015-08-26 MED ORDER — PROPOFOL 500 MG/50ML IV EMUL
INTRAVENOUS | Status: DC | PRN
  Administered 2015-08-26: 15 ug/kg/min via INTRAVENOUS

## 2015-08-26 MED ORDER — FENTANYL CITRATE (PF) 250 MCG/5ML IJ SOLN
INTRAMUSCULAR | Status: AC
  Filled 2015-08-26: qty 5

## 2015-08-26 SURGICAL SUPPLY — 35 items
APL SRG 3 HI ABS STRL LF PLS (MISCELLANEOUS) ×1
APPLICATOR COTTON TIP 6IN STRL (MISCELLANEOUS) ×3 IMPLANT
APPLICATOR DR MATTHEWS STRL (MISCELLANEOUS) ×3 IMPLANT
BLADE KERATOME 2.75 (BLADE) ×2 IMPLANT
BLADE KERATOME 2.75MM (BLADE) ×1
CANNULA ANTERIOR CHAMBER 27GA (MISCELLANEOUS) ×3 IMPLANT
CORDS BIPOLAR (ELECTRODE) IMPLANT
COVER MAYO STAND STRL (DRAPES) ×3 IMPLANT
DRAPE OPHTHALMIC 40X48 W POUCH (DRAPES) ×3 IMPLANT
DRAPE RETRACTOR (MISCELLANEOUS) ×3 IMPLANT
GLOVE BIO SURGEON STRL SZ8 (GLOVE) ×3 IMPLANT
GOWN STRL REUS W/ TWL LRG LVL3 (GOWN DISPOSABLE) ×3 IMPLANT
GOWN STRL REUS W/TWL LRG LVL3 (GOWN DISPOSABLE) ×9
KIT BASIN OR (CUSTOM PROCEDURE TRAY) ×3 IMPLANT
KIT ROOM TURNOVER OR (KITS) ×3 IMPLANT
LENS IOL ACRSF IQ PC 14.0 (Intraocular Lens) IMPLANT
LENS IOL ACRYSOF IQ POST 14.0 (Intraocular Lens) ×3 IMPLANT
NDL 18GX1X1/2 (RX/OR ONLY) (NEEDLE) ×1 IMPLANT
NDL 25GX 5/8IN NON SAFETY (NEEDLE) ×1 IMPLANT
NDL FILTER BLUNT 18X1 1/2 (NEEDLE) ×1 IMPLANT
NEEDLE 18GX1X1/2 (RX/OR ONLY) (NEEDLE) ×3 IMPLANT
NEEDLE 25GX 5/8IN NON SAFETY (NEEDLE) ×3 IMPLANT
NEEDLE FILTER BLUNT 18X 1/2SAF (NEEDLE) ×2
NEEDLE FILTER BLUNT 18X1 1/2 (NEEDLE) ×1 IMPLANT
NS IRRIG 1000ML POUR BTL (IV SOLUTION) ×3 IMPLANT
PACK CATARACT CUSTOM (CUSTOM PROCEDURE TRAY) ×3 IMPLANT
PAD ARMBOARD 7.5X6 YLW CONV (MISCELLANEOUS) ×3 IMPLANT
PAK PIK CVS CATARACT (OPHTHALMIC) ×3 IMPLANT
SUT ETHILON 10 0 CS140 6 (SUTURE) ×2 IMPLANT
SUT SILK 6 0 G 6 (SUTURE) IMPLANT
SYR TB 1ML LUER SLIP (SYRINGE) ×3 IMPLANT
TIP ABS 45DEG FLARED 0.9MM (TIP) ×3 IMPLANT
TOWEL OR 17X26 10 PK STRL BLUE (TOWEL DISPOSABLE) ×3 IMPLANT
WATER STERILE IRR 1000ML POUR (IV SOLUTION) ×3 IMPLANT
WIPE INSTRUMENT VISIWIPE 73X73 (MISCELLANEOUS) ×3 IMPLANT

## 2015-08-26 NOTE — Op Note (Signed)
Preoperative diagnosis:Vision significant cataract left eye Postop diagnosis: Same Procedure: Phacoemulsification with intraocular lens implant left eye Anesthesia: 2% Xylocaine with epinephrine a 50-50 mixture With [place] Complications: NoneComplications: None: None Asst.: Mindy Procedure: The patient was transported to the operating room where he was given a peribulbar block with the aforementioned local anesthetic agent. Following this the nurse anesthetist asked that we wait a few minutes for the patient to start back breathing and she placed and airway and. Following this the patient's face was prepped and draped in usual sterile fashion with a surgeon sitting temporally the operating microscope in position a Weck-Cel sponges used to fixate the globe and a 15 blade was used to enter through inferior clear cornea. Viscoat was injected to inflate the anterior chamber with an additional Weck-Cel sponge a 2.75 mm keratome blade was used in a stepwise fashion through temporal clear cornea to into the eye. Using a bent 25-gauge needle a continuous tear curvilinear capsulorrhexis was formed the capsule forceps were used to remove the anterior capsule. BSS was used to hydrodissect and hydrodelineate the nucleus. The phacoemulsification unit was used to remove the epinucleus deep sculpting occurred then using the snapper hook and the phaco tip the nucleus was divided into 4 quadrants and all nuclear fragments were removed. The irrigation-aspiration device was then used to strip cortical fibers from the posterior capsule after all cortical fibers had been removed Provisc was injected in the anterior chamber. The intraocular lens implant was examined and noted to have no defects the lens was an Alcon AcrySof SN 60 WF 14.0 dpt lens SN number [id number] the lens is placed in the lens injector and injected in the capsular bag and positioned with a Kuglen hook. Your gauge and aspiration device was then used to remove  viscoelastic from the eye Miochol was injected to pressurized the eye a single 10-0 nylon suture was placed to achieve watertight closure. Following this all instruments removed from the eye topical Tobrex ointment was applied a patch and [employer] were placed and the patient returned to recovery area in stable condition. Marylynn Pearson Junior M.D.

## 2015-08-26 NOTE — Anesthesia Preprocedure Evaluation (Addendum)
Anesthesia Evaluation  Patient identified by MRN, date of birth, ID band Patient awake    Reviewed: Allergy & Precautions, NPO status , Patient's Chart, lab work & pertinent test results, reviewed documented beta blocker date and time   Airway Mallampati: II  TM Distance: >3 FB Neck ROM: Full    Dental  (+) Dental Advisory Given   Pulmonary sleep apnea ,    breath sounds clear to auscultation       Cardiovascular hypertension, Pt. on medications and Pt. on home beta blockers + dysrhythmias Atrial Fibrillation + pacemaker  Rhythm:Regular Rate:Normal     Neuro/Psych CVA    GI/Hepatic Neg liver ROS, GERD  ,  Endo/Other  negative endocrine ROS  Renal/GU CRFRenal disease     Musculoskeletal  (+) Arthritis ,   Abdominal   Peds  Hematology negative hematology ROS (+)   Anesthesia Other Findings   Reproductive/Obstetrics                            Lab Results  Component Value Date   WBC 4.2 08/26/2015   HGB 12.6* 08/26/2015   HCT 38.8* 08/26/2015   MCV 100.5* 08/26/2015   PLT 150 08/26/2015   Lab Results  Component Value Date   CREATININE 1.48* 08/26/2015   BUN 24* 08/26/2015   NA 143 08/26/2015   K 4.5 08/26/2015   CL 109 08/26/2015   CO2 25 08/26/2015    Anesthesia Physical Anesthesia Plan  ASA: III  Anesthesia Plan: MAC   Post-op Pain Management:    Induction: Intravenous  Airway Management Planned: Nasal Cannula and Natural Airway  Additional Equipment:   Intra-op Plan:   Post-operative Plan:   Informed Consent: I have reviewed the patients History and Physical, chart, labs and discussed the procedure including the risks, benefits and alternatives for the proposed anesthesia with the patient or authorized representative who has indicated his/her understanding and acceptance.     Plan Discussed with: CRNA  Anesthesia Plan Comments:         Anesthesia Quick  Evaluation

## 2015-08-26 NOTE — Interval H&P Note (Signed)
History and Physical Interval Note:  08/26/2015 10:05 AM  [name]  has presented today for surgery, with the diagnosis of AGE RELATED NUCLEAR CATARACC  The various methods of treatment have been discussed with the patient and family. After consideration of risks, benefits and other options for treatment, the patient has consented to  Procedure(s): CATARACT EXTRACTION PHACO AND INTRAOCULAR LENS PLACEMENT ([place]) (Left) as a surgical intervention .  The patient's history has been reviewed, patient examined, no change in status, stable for surgery.  I have reviewed the patient's chart and labs.  Questions were answered to the patient's satisfaction.     Rhylen Pulido

## 2015-08-26 NOTE — H&P (View-Only) (Signed)
History & Physical:   DATE:   08-11-2015  NAME:  [name], Michael [name]     QF:[id number]       HISTORY OF PRESENT ILLNESS: Referred by [facility] Complaints blurry vision  & cloudy OS I can't read with OS  PO CE w IOL OD. Va seems to have improved since surgery. No pain in OU.  Patient c/o having a decrease in vision out of OS   HPI: EYES: Reports symptoms of worse OS       LOCATION:      QUALITY/COURSE:   Reports condition is worsening.        INTENSITY/SEVERITY:    Reports measurement ( or degree) as moderate.      DURATION:   Reports the general length of symptoms to be months.                  ACTIVE PROBLEMS: Vitreous degeneration, right eye   ICD10: H43.811  ICD9:    Meibomian gland dysfunction   ICD10: H02.89 ICD9: 375.15    EIOP OU  Pseudophakia   ICD10: Z96.1 ICD9: 446.0  Essential hypertension    ICD10: I10 Initial Date:  ICD9: 401.1  Keratoconjunctivitis sicca, not specified as Sjogren's, bilateral   ICD10: H16.223  ICD9:   Hypertensive retinopathy   ICD10: H35.039  ICD9: 362.11  Onset: 05/19/2015 14:39  Initial Date:    Age-related nuclear cataract, left eye   ICD10: H25.12  ICD9:   Onset:   Initial Date:  SURGERIES: 06/08/15 CE w IOL OD  radiation on RL removal of tumor Pacemaker x3  Tumor RLL 1960s Kidney Removed Right hypernephroma  Pick List - Surgeries  MEDICATIONS:   Coumadin (Warfarin):   5 mg tablet  SIG-   as directed   once a day  Sunday Tues, Thurs  Coumadin (Warfarin):   2.5 mg tablet  SIG-   as directed   once a day  M/W/F  Proscar (Finasteride):   5 mg tablet  SIG-  1 tab(s)   once a day   Cardura (Doxazosin):    2 mg tablet    SIG-   1 each     once a day              Clonazepam (Klonopin):   2 mg tablet  SIG-  1 each   3 times a day   Aspirin:  81 mg tablet  SIG-  1 each   once a day   Multivitamin: Strength-  SIG-  Dose-  Freq-    Atenolol (Tenormin):   25 mg tablet  SIG-  1 each   once a day   Furosemide  (Lasix):   20 mg tablet  SIG-  1 each    in the AM    Galantamine Hydrobromide ER: 8 mg capsule, extended release SIG-  Dose-  Freq-    Alphagan P: 0.1% solution SIG-  1 drop in Right Eye Twice daily   Restasis: 0.05% emulsion SIG-  1 gtt BID OU  Pred Forte: Strength-  SIG-    Ciloxan (Ciprofloxacin) Solution: 0.3% solution SIG-  2 drop(s)  every 4 hours    Acular (Ketorlac Tromethamine):   0.5% solution  SIG-  2 drop(s)   4 times a day  REVIEW OF SYSTEMS: ROS:   GEN- Constitutional: HENT: GEN - Endocrine: Reports  symptoms of LUNGS/Respiratory:  HEART/Cardiovascular: Reports symptoms of hypertension.    ABD/Gastrointestinal:   Musculoskeletal (BJE): NEURO/Neurological: PSYCH/Psychiatric:    Is the pt oriented to time, place, person? yes  Mood normal     TOBACCO:Smoker Status:   Never smoker   ICD10: Z87.898 ICD9: V13.89 Onset: 05/19/2015 14:41  SOCIAL HISTORY: Starter Pick List - Social History  FAMILY HISTORY: Family History - 1st Degree Relatives:  Son alive and well.  ALLERGIES: Drug Allergies.  No Known.   Starter - Allergies - Summary:  PHYSICAL EXAMINATION: VS: BMI: 25.2.  BP: 134/69.  H: 70.00 in.  P: 72 /min.  W: 175lbs 0oz.    Va  08/11/2015 14:09  OD [place] 20/25-2  Ph 20/20   OS cc 20/25  glare test  OS cc 20/30+1  EYEGLASSES:  OD -1.75 +0.75 X015         OS -1.50 +1.25 X122 ADD: +2.75  MR  08/11/2015 14:11  OD -2.00 +1.50 x035 20/20-1 OS -1.50 +1.25 x122 20/25+2 ADD  +2.75  K's OD:  43.25   axis 112       43.50    axis 022 OS:  43.25   axis 015       44.00    axis 105  VF: FULL IN ALL FOUR QUADRANTS   OU  Motility   PUPILS: 44mm reactive -mg   EYELIDS & OCULAR ADNEXA loss of lashes RL & clogged glands ,   SLE: Conjunctiva inclusion cyst in conj OS,  pinguecula    Cornea arcus 2-3,INFERIOR STAINING &   Decreased Tear Break-Up Time  OU    anterior chamber  :  deep and quiet   OD   Iris brown   Lens     posterior chamber  intraocular  lens implant  OD good position,   2-3  nuclear  sclerosis  , 2+ cortical OS  Vitreous posterior vitreous detachment   OD   CCT01/24/2017 16:19  OD554 OS 557  Ta   in mmHg    OD  20        OS 20 Time  08/11/2015 15:43   Gonio   Dilation        Fundus: difficult view OD   optic nerve  OD 35% cup TEMPORAL DISCOLORATION                                                                    OS 35% cup    Macula      OD difficult view, flat  , dull light reflex  , no edema                                                 OS dull light reflex   Vessels narrow arterioles   Periphery flat and detached OD  normal OS     Exam: GENERAL: Appearance: HEAD, EARS, NOSE AND THROAT: Ears-Nose (external) Inspection: Externally, nose and ears are normal in appearance and without scars, lesions, or nodules.      Hearing assessment shows no problems with normal conversation.      LUNGS and  RESPIRATORY: Lung auscultation elicits no wheezing, rhonci, rales or rubs and with equal breath sounds.    Respiratory effort described as breathing is unlabored and chest movement is symmetrical.    HEART (Cardiovascular): Heart auscultation discovers  grade III/IV murmur  ABDOMEN (Gastrointestinal): Mass/Tenderness Exam: Neither are present.     MUSCULOSKELETAL (BJE): Inspection-Palpation: No major bone, joint, tendon, or muscle changes.      NEUROLOGICAL: Alert and oriented. No major deficits of coordination or sensation.      PSYCHIATRIC: Insight and judgment appear  both to be intact and appropriate.    Mood and affect are described as normal mood and full affect.    SKIN: Skin Inspection: No rashes or lesions  ADMITTING DIAGNOSIS Age-related nuclear cataract, left eye   ICD10: H25.12  ICD9:   Onset:   Initial Date: Vitreous degeneration, right eye   ICD10: H43.811  ICD9:    Meibomian gland dysfunction   ICD10: H02.89 ICD9: 375.15    EIOP OU  Pseudophakia   ICD10: Z96.1   Essential hypertension    ICD10:  I10   Keratoconjunctivitis sicca, not specified as Sjogren's, bilateral   ICD10: H16.223   Hypertensive retinopathy   ICD10: H35.039  ICD9: 362.11  Onset: 05/19/2015 14:39   SURGICAL TREATMENT PLAN: if VA stable OD consider  phaco emulsion cataract extraction   OS    phaco emulsion cataract extraction  w  intraocular lens implant  OS  Risk and benefits of surgery have been reviewed with the patient and the patient agrees to proceed with the surgical procedure.     ___________________________ Marylynn Pearson, Lauris Chroman - Inactive Problems:

## 2015-08-26 NOTE — Transfer of Care (Signed)
Immediate Anesthesia Transfer of Care Note  Patient: [name]  Procedure(s) Performed: Procedure(s): CATARACT EXTRACTION PHACO AND INTRAOCULAR LENS PLACEMENT ([facility]) (Left)  Patient Location: PACU  Anesthesia Type:General  Level of Consciousness: awake, alert , patient cooperative and responds to stimulation  Airway & Oxygen Therapy: Patient Spontanous Breathing and Patient connected to nasal cannula oxygen  Post-op Assessment: Report given to RN, Post -op Vital signs reviewed and stable and Patient moving all extremities X 4  Post vital signs: Reviewed and stable  Last Vitals:  Filed Vitals:   08/26/15 0821  BP: 123/71  Pulse: 71  Temp: 36.5 C  Resp: 20    Complications: No apparent anesthesia complications

## 2015-08-26 NOTE — Anesthesia Procedure Notes (Signed)
Procedure Name: MAC Date/Time: 08/26/2015 10:30 AM Performed by: Tressia Miners LEFFEW Pre-anesthesia Checklist: Patient identified, Emergency Drugs available, Suction available, Timeout performed and Patient being monitored Patient Re-evaluated:Patient Re-evaluated prior to inductionOxygen Delivery Method: Nasal cannula Placement Confirmation: positive ETCO2

## 2015-08-26 NOTE — Anesthesia Postprocedure Evaluation (Signed)
Anesthesia Post Note  Patient: [name]  Procedure(s) Performed: Procedure(s) (LRB): CATARACT EXTRACTION PHACO AND INTRAOCULAR LENS PLACEMENT ([facility]) (Left)  Patient location during evaluation: PACU Anesthesia Type: MAC Level of consciousness: awake and alert Pain management: pain level controlled Vital Signs Assessment: post-procedure vital signs reviewed and stable Respiratory status: spontaneous breathing Cardiovascular status: blood pressure returned to baseline Anesthetic complications: no    Last Vitals:  Filed Vitals:   08/26/15 1200 08/26/15 1215  BP: 137/89 140/94  Pulse:  71  Temp: 36.6 C   Resp: 15     Last Pain: There were no vitals filed for this visit.               Tiajuana Amass

## 2015-08-26 NOTE — Discharge Instructions (Signed)
The patient may remove the eye patch today at 3:00 PM and instilled the eyedrops given to him at the office. The patient should sleep with the plastic she'll over his eye he should sleep on his back or right side Avoid heavy lifting bending or straining and do not rub the eye.

## 2015-08-27 ENCOUNTER — Encounter (HOSPITAL_COMMUNITY): Payer: Self-pay | Admitting: Ophthalmology

## 2015-09-06 ENCOUNTER — Encounter: Payer: Self-pay | Admitting: Cardiology

## 2015-09-06 LAB — CUP PACEART REMOTE DEVICE CHECK
Battery Impedance: 716 Ohm
Brady Statistic RV Percent Paced: 94 %
Date Time Interrogation Session: 20170210224140
Implantable Lead Implant Date: 20130702
Implantable Lead Location: 753860
Lead Channel Setting Pacing Amplitude: 2.5 V
Lead Channel Setting Pacing Pulse Width: 0.4 ms
Lead Channel Setting Sensing Sensitivity: 4 mV
MDC IDC MSMT BATTERY REMAINING LONGEVITY: 64 mo
MDC IDC MSMT BATTERY VOLTAGE: 2.76 V
MDC IDC MSMT LEADCHNL RA IMPEDANCE VALUE: 0 Ohm
MDC IDC MSMT LEADCHNL RV IMPEDANCE VALUE: 454 Ohm

## 2015-09-06 NOTE — Progress Notes (Signed)
Normal remote reviewed. 26 NSVT   Next [employer] 11/16/15

## 2015-09-14 ENCOUNTER — Ambulatory Visit (INDEPENDENT_AMBULATORY_CARE_PROVIDER_SITE_OTHER): Payer: PPO | Admitting: *Deleted

## 2015-09-14 DIAGNOSIS — Z5181 Encounter for therapeutic drug level monitoring: Secondary | ICD-10-CM | POA: Diagnosis not present

## 2015-09-14 DIAGNOSIS — I4891 Unspecified atrial fibrillation: Secondary | ICD-10-CM

## 2015-09-14 DIAGNOSIS — Z7901 Long term (current) use of anticoagulants: Secondary | ICD-10-CM

## 2015-09-14 LAB — POCT INR: INR: 2

## 2015-09-15 DIAGNOSIS — N400 Enlarged prostate without lower urinary tract symptoms: Secondary | ICD-10-CM | POA: Diagnosis not present

## 2015-09-15 DIAGNOSIS — R911 Solitary pulmonary nodule: Secondary | ICD-10-CM | POA: Diagnosis not present

## 2015-09-15 DIAGNOSIS — I495 Sick sinus syndrome: Secondary | ICD-10-CM | POA: Diagnosis not present

## 2015-09-15 DIAGNOSIS — N183 Chronic kidney disease, stage 3 (moderate): Secondary | ICD-10-CM | POA: Diagnosis not present

## 2015-09-15 DIAGNOSIS — R7301 Impaired fasting glucose: Secondary | ICD-10-CM | POA: Diagnosis not present

## 2015-09-15 DIAGNOSIS — G4733 Obstructive sleep apnea (adult) (pediatric): Secondary | ICD-10-CM | POA: Diagnosis not present

## 2015-09-15 DIAGNOSIS — I272 Other secondary pulmonary hypertension: Secondary | ICD-10-CM | POA: Diagnosis not present

## 2015-09-15 DIAGNOSIS — E7800 Pure hypercholesterolemia, unspecified: Secondary | ICD-10-CM | POA: Diagnosis not present

## 2015-09-15 DIAGNOSIS — Z1389 Encounter for screening for other disorder: Secondary | ICD-10-CM | POA: Diagnosis not present

## 2015-09-15 DIAGNOSIS — Z0000 Encounter for general adult medical examination without abnormal findings: Secondary | ICD-10-CM | POA: Diagnosis not present

## 2015-10-26 ENCOUNTER — Ambulatory Visit (INDEPENDENT_AMBULATORY_CARE_PROVIDER_SITE_OTHER): Payer: PPO | Admitting: Pharmacist

## 2015-10-26 DIAGNOSIS — I4891 Unspecified atrial fibrillation: Secondary | ICD-10-CM

## 2015-10-26 DIAGNOSIS — Z5181 Encounter for therapeutic drug level monitoring: Secondary | ICD-10-CM | POA: Diagnosis not present

## 2015-10-26 DIAGNOSIS — Z7901 Long term (current) use of anticoagulants: Secondary | ICD-10-CM

## 2015-10-26 LAB — POCT INR: INR: 2

## 2015-11-16 ENCOUNTER — Telehealth: Payer: Self-pay | Admitting: *Deleted

## 2015-11-16 ENCOUNTER — Telehealth: Payer: Self-pay | Admitting: Cardiology

## 2015-11-16 ENCOUNTER — Ambulatory Visit (INDEPENDENT_AMBULATORY_CARE_PROVIDER_SITE_OTHER): Payer: PPO | Admitting: *Deleted

## 2015-11-16 DIAGNOSIS — I442 Atrioventricular block, complete: Secondary | ICD-10-CM | POA: Diagnosis not present

## 2015-11-16 NOTE — Telephone Encounter (Signed)
LMOVM reminding pt to send remote transmission.   

## 2015-11-16 NOTE — Telephone Encounter (Signed)
Patient called in concerned because he was referred to tech services regarding his [employer] monitor SN not matching the SN of the monitor that he has at home.  Patient reports that he only has one monitor and that he has been using it to transmit since 2013.  Unable to update [employer] SN as it is linked to another patient in [place].  Patient is upset that his SN was dissociated from his profile and that the [place] services rep was unable to answer his questions about how this could have happened.  Advised patient that we have no way to track this information, but that I can call [employer] tech services for assistance.  He is agreeable to me calling him back after I speak with a [employer] rep.  Spoke with [employer] rep, Verdis Frederickson, who is unsure where new Carelink monitor SN came from.  She states that she will have to have her supervisor review it and call me back.  She is agreeable to calling me back tomorrow after 2:00pm at the device clinic phone number.  In the interim, patient's remote transmission was received.  Verdis Frederickson states she is unsure of how it was received and will hopefully have more information tomorrow.  Called patient to make him aware.  He is agreeable to me contacting him tomorrow afternoon regarding his monitor and which steps he needs to take next.

## 2015-11-17 NOTE — Progress Notes (Signed)
Remote pacemaker transmission.   

## 2015-11-20 ENCOUNTER — Telehealth: Payer: Self-pay | Admitting: Nurse Practitioner

## 2015-11-20 NOTE — Telephone Encounter (Signed)
See phone note from 11/20/15.

## 2015-11-20 NOTE — Telephone Encounter (Signed)
Returned patient's call.  Advised patient to bring his old 65 Carelink monitor with him to his coumadin clinic appointment on 12/07/15 and we will send it back to the company for him.  Advised patient to keep his Staci Acosta and the new [place] monitor with the SN [id number].  Patient verbalizes understanding and is appreciative of call.

## 2015-11-20 NOTE — Telephone Encounter (Signed)
New message     The pt wanted to know where to sent his old pacemaker to since he has a new pacemaker. Pt just need address to mail the other to

## 2015-12-01 LAB — CUP PACEART REMOTE DEVICE CHECK
Battery Remaining Longevity: 59 mo
Battery Voltage: 2.76 V
Implantable Lead Implant Date: 20130702
Implantable Lead Location: 753860
Lead Channel Impedance Value: 0 Ohm
Lead Channel Pacing Threshold Amplitude: 0.75 V
Lead Channel Pacing Threshold Pulse Width: 0.4 ms
Lead Channel Setting Pacing Amplitude: 2.5 V
MDC IDC MSMT BATTERY IMPEDANCE: 820 Ohm
MDC IDC MSMT LEADCHNL RV IMPEDANCE VALUE: 454 Ohm
MDC IDC SESS DTM: 20170515201412
MDC IDC SET LEADCHNL RV PACING PULSEWIDTH: 0.4 ms
MDC IDC SET LEADCHNL RV SENSING SENSITIVITY: 4 mV
MDC IDC STAT BRADY RV PERCENT PACED: 94 %

## 2015-12-07 ENCOUNTER — Ambulatory Visit (INDEPENDENT_AMBULATORY_CARE_PROVIDER_SITE_OTHER): Payer: PPO | Admitting: *Deleted

## 2015-12-07 DIAGNOSIS — I4891 Unspecified atrial fibrillation: Secondary | ICD-10-CM

## 2015-12-07 DIAGNOSIS — Z5181 Encounter for therapeutic drug level monitoring: Secondary | ICD-10-CM | POA: Diagnosis not present

## 2015-12-07 DIAGNOSIS — Z7901 Long term (current) use of anticoagulants: Secondary | ICD-10-CM

## 2015-12-07 LAB — POCT INR: INR: 1.7

## 2015-12-16 ENCOUNTER — Encounter: Payer: Self-pay | Admitting: Cardiology

## 2016-01-01 DIAGNOSIS — I951 Orthostatic hypotension: Secondary | ICD-10-CM | POA: Diagnosis not present

## 2016-01-01 DIAGNOSIS — A09 Infectious gastroenteritis and colitis, unspecified: Secondary | ICD-10-CM | POA: Diagnosis not present

## 2016-01-01 DIAGNOSIS — R42 Dizziness and giddiness: Secondary | ICD-10-CM | POA: Diagnosis not present

## 2016-01-01 DIAGNOSIS — J029 Acute pharyngitis, unspecified: Secondary | ICD-10-CM | POA: Diagnosis not present

## 2016-01-02 ENCOUNTER — Inpatient Hospital Stay (HOSPITAL_COMMUNITY): Payer: PPO

## 2016-01-02 ENCOUNTER — Emergency Department (HOSPITAL_COMMUNITY): Payer: PPO

## 2016-01-02 ENCOUNTER — Inpatient Hospital Stay (HOSPITAL_COMMUNITY)
Admission: EM | Admit: 2016-01-02 | Discharge: 2016-01-06 | DRG: 871 | Disposition: A | Discharge status: Home Health | Payer: PPO | Attending: Internal Medicine | Admitting: Internal Medicine

## 2016-01-02 ENCOUNTER — Encounter (HOSPITAL_COMMUNITY): Payer: Self-pay | Admitting: *Deleted

## 2016-01-02 DIAGNOSIS — R911 Solitary pulmonary nodule: Secondary | ICD-10-CM | POA: Diagnosis present

## 2016-01-02 DIAGNOSIS — J189 Pneumonia, unspecified organism: Secondary | ICD-10-CM | POA: Diagnosis present

## 2016-01-02 DIAGNOSIS — G4733 Obstructive sleep apnea (adult) (pediatric): Secondary | ICD-10-CM | POA: Diagnosis not present

## 2016-01-02 DIAGNOSIS — E872 Acidosis: Secondary | ICD-10-CM | POA: Diagnosis not present

## 2016-01-02 DIAGNOSIS — R6510 Systemic inflammatory response syndrome (SIRS) of non-infectious origin without acute organ dysfunction: Secondary | ICD-10-CM

## 2016-01-02 DIAGNOSIS — Z8701 Personal history of pneumonia (recurrent): Secondary | ICD-10-CM | POA: Diagnosis not present

## 2016-01-02 DIAGNOSIS — Z7982 Long term (current) use of aspirin: Secondary | ICD-10-CM

## 2016-01-02 DIAGNOSIS — I4891 Unspecified atrial fibrillation: Secondary | ICD-10-CM | POA: Diagnosis not present

## 2016-01-02 DIAGNOSIS — N390 Urinary tract infection, site not specified: Secondary | ICD-10-CM | POA: Diagnosis not present

## 2016-01-02 DIAGNOSIS — Z7901 Long term (current) use of anticoagulants: Secondary | ICD-10-CM | POA: Diagnosis not present

## 2016-01-02 DIAGNOSIS — I482 Chronic atrial fibrillation: Secondary | ICD-10-CM | POA: Diagnosis present

## 2016-01-02 DIAGNOSIS — A419 Sepsis, unspecified organism: Secondary | ICD-10-CM | POA: Diagnosis not present

## 2016-01-02 DIAGNOSIS — J40 Bronchitis, not specified as acute or chronic: Secondary | ICD-10-CM | POA: Diagnosis present

## 2016-01-02 DIAGNOSIS — Z950 Presence of cardiac pacemaker: Secondary | ICD-10-CM

## 2016-01-02 DIAGNOSIS — Z823 Family history of stroke: Secondary | ICD-10-CM | POA: Diagnosis not present

## 2016-01-02 DIAGNOSIS — R05 Cough: Secondary | ICD-10-CM | POA: Diagnosis not present

## 2016-01-02 DIAGNOSIS — N179 Acute kidney failure, unspecified: Secondary | ICD-10-CM

## 2016-01-02 DIAGNOSIS — Z85528 Personal history of other malignant neoplasm of kidney: Secondary | ICD-10-CM

## 2016-01-02 DIAGNOSIS — G2581 Restless legs syndrome: Secondary | ICD-10-CM | POA: Diagnosis not present

## 2016-01-02 DIAGNOSIS — Z8673 Personal history of transient ischemic attack (TIA), and cerebral infarction without residual deficits: Secondary | ICD-10-CM | POA: Diagnosis not present

## 2016-01-02 DIAGNOSIS — N183 Chronic kidney disease, stage 3 (moderate): Secondary | ICD-10-CM | POA: Diagnosis present

## 2016-01-02 DIAGNOSIS — F0390 Unspecified dementia without behavioral disturbance: Secondary | ICD-10-CM | POA: Diagnosis present

## 2016-01-02 DIAGNOSIS — R509 Fever, unspecified: Secondary | ICD-10-CM

## 2016-01-02 DIAGNOSIS — I129 Hypertensive chronic kidney disease with stage 1 through stage 4 chronic kidney disease, or unspecified chronic kidney disease: Secondary | ICD-10-CM | POA: Diagnosis not present

## 2016-01-02 DIAGNOSIS — K219 Gastro-esophageal reflux disease without esophagitis: Secondary | ICD-10-CM | POA: Diagnosis present

## 2016-01-02 DIAGNOSIS — I509 Heart failure, unspecified: Secondary | ICD-10-CM | POA: Diagnosis not present

## 2016-01-02 DIAGNOSIS — Z905 Acquired absence of kidney: Secondary | ICD-10-CM | POA: Diagnosis not present

## 2016-01-02 DIAGNOSIS — R7989 Other specified abnormal findings of blood chemistry: Secondary | ICD-10-CM

## 2016-01-02 DIAGNOSIS — J209 Acute bronchitis, unspecified: Secondary | ICD-10-CM | POA: Diagnosis not present

## 2016-01-02 DIAGNOSIS — R0602 Shortness of breath: Secondary | ICD-10-CM | POA: Diagnosis not present

## 2016-01-02 DIAGNOSIS — Z5181 Encounter for therapeutic drug level monitoring: Secondary | ICD-10-CM

## 2016-01-02 LAB — PROTIME-INR
INR: 2.2 — AB (ref 0.00–1.49)
Prothrombin Time: 24.2 seconds — ABNORMAL HIGH (ref 11.6–15.2)

## 2016-01-02 LAB — BASIC METABOLIC PANEL
Anion gap: 4 — ABNORMAL LOW (ref 5–15)
BUN: 27 mg/dL — ABNORMAL HIGH (ref 6–20)
CHLORIDE: 112 mmol/L — AB (ref 101–111)
CO2: 21 mmol/L — ABNORMAL LOW (ref 22–32)
CREATININE: 1.8 mg/dL — AB (ref 0.61–1.24)
Calcium: 8.1 mg/dL — ABNORMAL LOW (ref 8.9–10.3)
GFR, EST AFRICAN AMERICAN: 38 mL/min — AB (ref >60)
GFR, EST NON AFRICAN AMERICAN: 33 mL/min — AB (ref >60)
Glucose, Bld: 136 mg/dL — ABNORMAL HIGH (ref 65–99)
POTASSIUM: 4 mmol/L (ref 3.5–5.1)
SODIUM: 137 mmol/L (ref 135–145)

## 2016-01-02 LAB — RESPIRATORY PANEL BY PCR
Adenovirus: NOT DETECTED
BORDETELLA PERTUSSIS-RVPCR: NOT DETECTED
CHLAMYDOPHILA PNEUMONIAE-RVPPCR: NOT DETECTED
CORONAVIRUS HKU1-RVPPCR: NOT DETECTED
Coronavirus 229E: NOT DETECTED
Coronavirus NL63: NOT DETECTED
Coronavirus OC43: NOT DETECTED
INFLUENZA A H1 2009-RVPPR: NOT DETECTED
INFLUENZA A H1-RVPPCR: NOT DETECTED
INFLUENZA A H3-RVPPCR: NOT DETECTED
Influenza A: NOT DETECTED
Influenza B: NOT DETECTED
METAPNEUMOVIRUS-RVPPCR: NOT DETECTED
MYCOPLASMA PNEUMONIAE-RVPPCR: NOT DETECTED
Parainfluenza Virus 1: NOT DETECTED
Parainfluenza Virus 2: NOT DETECTED
Parainfluenza Virus 3: NOT DETECTED
Parainfluenza Virus 4: NOT DETECTED
RESPIRATORY SYNCYTIAL VIRUS-RVPPCR: NOT DETECTED
RHINOVIRUS / ENTEROVIRUS - RVPPCR: NOT DETECTED

## 2016-01-02 LAB — URINALYSIS, ROUTINE W REFLEX MICROSCOPIC
GLUCOSE, UA: NEGATIVE mg/dL
KETONES UR: 15 mg/dL — AB
Nitrite: NEGATIVE
PROTEIN: 100 mg/dL — AB
Specific Gravity, Urine: 1.026 (ref 1.005–1.030)
pH: 5.5 (ref 5.0–8.0)

## 2016-01-02 LAB — URINE MICROSCOPIC-ADD ON

## 2016-01-02 LAB — COMPREHENSIVE METABOLIC PANEL
ALT: 18 U/L (ref 17–63)
AST: 22 U/L (ref 15–41)
Albumin: 3.5 g/dL (ref 3.5–5.0)
Alkaline Phosphatase: 93 U/L (ref 38–126)
Anion gap: 5 (ref 5–15)
BILIRUBIN TOTAL: 1.8 mg/dL — AB (ref 0.3–1.2)
BUN: 23 mg/dL — AB (ref 6–20)
CO2: 25 mmol/L (ref 22–32)
CREATININE: 1.89 mg/dL — AB (ref 0.61–1.24)
Calcium: 9.4 mg/dL (ref 8.9–10.3)
Chloride: 109 mmol/L (ref 101–111)
GFR calc Af Amer: 36 mL/min — ABNORMAL LOW (ref >60)
GFR, EST NON AFRICAN AMERICAN: 31 mL/min — AB (ref >60)
GLUCOSE: 136 mg/dL — AB (ref 65–99)
POTASSIUM: 4.2 mmol/L (ref 3.5–5.1)
Sodium: 139 mmol/L (ref 135–145)
TOTAL PROTEIN: 6.8 g/dL (ref 6.5–8.1)

## 2016-01-02 LAB — CBC WITH DIFFERENTIAL/PLATELET
BASOS ABS: 0 10*3/uL (ref 0.0–0.1)
Basophils Relative: 0 %
EOS ABS: 0 10*3/uL (ref 0.0–0.7)
EOS PCT: 0 %
HCT: 40.3 % (ref 39.0–52.0)
Hemoglobin: 12.7 g/dL — ABNORMAL LOW (ref 13.0–17.0)
LYMPHS PCT: 4 %
Lymphs Abs: 0.5 10*3/uL — ABNORMAL LOW (ref 0.7–4.0)
MCH: 31.8 pg (ref 26.0–34.0)
MCHC: 31.5 g/dL (ref 30.0–36.0)
MCV: 100.8 fL — AB (ref 78.0–100.0)
MONO ABS: 0.4 10*3/uL (ref 0.1–1.0)
Monocytes Relative: 4 %
Neutro Abs: 11.2 10*3/uL — ABNORMAL HIGH (ref 1.7–7.7)
Neutrophils Relative %: 92 %
PLATELETS: 150 10*3/uL (ref 150–400)
RBC: 4 MIL/uL — ABNORMAL LOW (ref 4.22–5.81)
RDW: 13.4 % (ref 11.5–15.5)
WBC: 12.1 10*3/uL — AB (ref 4.0–10.5)

## 2016-01-02 LAB — CBC
HEMATOCRIT: 33.9 % — AB (ref 39.0–52.0)
Hemoglobin: 10.8 g/dL — ABNORMAL LOW (ref 13.0–17.0)
MCH: 32.6 pg (ref 26.0–34.0)
MCHC: 31.9 g/dL (ref 30.0–36.0)
MCV: 102.4 fL — ABNORMAL HIGH (ref 78.0–100.0)
PLATELETS: 122 10*3/uL — AB (ref 150–400)
RBC: 3.31 MIL/uL — ABNORMAL LOW (ref 4.22–5.81)
RDW: 13.7 % (ref 11.5–15.5)
WBC: 12 10*3/uL — AB (ref 4.0–10.5)

## 2016-01-02 LAB — LACTIC ACID, PLASMA
LACTIC ACID, VENOUS: 2.4 mmol/L — AB (ref 0.5–1.9)
LACTIC ACID, VENOUS: 3 mmol/L — AB (ref 0.5–1.9)

## 2016-01-02 LAB — PROCALCITONIN: PROCALCITONIN: 6.57 ng/mL

## 2016-01-02 LAB — I-STAT CG4 LACTIC ACID, ED
LACTIC ACID, VENOUS: 1.55 mmol/L (ref 0.5–1.9)
Lactic Acid, Venous: 1.85 mmol/L (ref 0.5–1.9)

## 2016-01-02 MED ORDER — ONDANSETRON HCL 4 MG PO TABS: 4.0000 mg | ORAL_TABLET | Freq: Four times a day (QID) | ORAL | Status: DC | PRN

## 2016-01-02 MED ORDER — ACETAMINOPHEN 325 MG PO TABS
650.0000 mg | ORAL_TABLET | Freq: Once | ORAL | Status: AC
  Administered 2016-01-02: 650 mg via ORAL
  Filled 2016-01-02: qty 2

## 2016-01-02 MED ORDER — DEXTROSE 5 % IV SOLN
500.0000 mg | INTRAVENOUS | Status: AC
  Administered 2016-01-02: 500 mg via INTRAVENOUS
  Filled 2016-01-02: qty 500

## 2016-01-02 MED ORDER — POLYETHYLENE GLYCOL 3350 17 G PO PACK: 17.0000 g | PACK | Freq: Every day | ORAL | Status: DC | PRN

## 2016-01-02 MED ORDER — SODIUM CHLORIDE 0.9 % IV BOLUS (SEPSIS)
1000.0000 mL | Freq: Once | INTRAVENOUS | Status: AC
  Administered 2016-01-02: 1000 mL via INTRAVENOUS

## 2016-01-02 MED ORDER — WARFARIN SODIUM 5 MG PO TABS
5.0000 mg | ORAL_TABLET | ORAL | Status: DC
  Administered 2016-01-02 – 2016-01-03 (×2): 5 mg via ORAL
  Filled 2016-01-02 (×2): qty 1

## 2016-01-02 MED ORDER — ADULT MULTIVITAMIN W/MINERALS CH
1.0000 | ORAL_TABLET | Freq: Every day | ORAL | Status: DC
  Administered 2016-01-03 – 2016-01-06 (×4): 1 via ORAL
  Filled 2016-01-02 (×5): qty 1

## 2016-01-02 MED ORDER — SODIUM CHLORIDE 0.9 % IV BOLUS (SEPSIS)
2000.0000 mL | Freq: Once | INTRAVENOUS | Status: AC
  Administered 2016-01-02: 2000 mL via INTRAVENOUS

## 2016-01-02 MED ORDER — WARFARIN SODIUM 5 MG PO TABS: 2.5000 mg | ORAL_TABLET | Freq: Every day | ORAL | Status: DC

## 2016-01-02 MED ORDER — DOXAZOSIN MESYLATE 2 MG PO TABS
2.0000 mg | ORAL_TABLET | Freq: Every day | ORAL | Status: DC
  Administered 2016-01-03 – 2016-01-06 (×4): 2 mg via ORAL
  Filled 2016-01-02 (×5): qty 1

## 2016-01-02 MED ORDER — GALANTAMINE HYDROBROMIDE 4 MG PO TABS
8.0000 mg | ORAL_TABLET | Freq: Two times a day (BID) | ORAL | Status: DC
  Administered 2016-01-02 – 2016-01-06 (×8): 8 mg via ORAL
  Filled 2016-01-02 (×10): qty 2

## 2016-01-02 MED ORDER — PIPERACILLIN-TAZOBACTAM 3.375 G IVPB
3.3750 g | Freq: Three times a day (TID) | INTRAVENOUS | Status: DC
  Administered 2016-01-03 – 2016-01-06 (×12): 3.375 g via INTRAVENOUS
  Filled 2016-01-02 (×14): qty 50

## 2016-01-02 MED ORDER — ONDANSETRON HCL 4 MG/2ML IJ SOLN: 4.0000 mg | Freq: Four times a day (QID) | INTRAMUSCULAR | Status: DC | PRN

## 2016-01-02 MED ORDER — HYPROMELLOSE (GONIOSCOPIC) 2.5 % OP SOLN
1.0000 [drp] | Freq: Four times a day (QID) | OPHTHALMIC | Status: DC | PRN
Start: 2016-01-02 — End: 2016-01-06

## 2016-01-02 MED ORDER — WARFARIN SODIUM 5 MG PO TABS: 2.5000 mg | ORAL_TABLET | ORAL | Status: DC

## 2016-01-02 MED ORDER — DEXTROSE 5 % IV SOLN: 1.0000 g | INTRAVENOUS | Status: DC

## 2016-01-02 MED ORDER — ACETAMINOPHEN 325 MG PO TABS
650.0000 mg | ORAL_TABLET | ORAL | Status: DC | PRN
  Administered 2016-01-02 – 2016-01-03 (×2): 650 mg via ORAL
  Filled 2016-01-02 (×2): qty 2

## 2016-01-02 MED ORDER — DEXTROSE 5 % IV SOLN
1.0000 g | INTRAVENOUS | Status: AC
  Administered 2016-01-02: 1 g via INTRAVENOUS
  Filled 2016-01-02: qty 10

## 2016-01-02 MED ORDER — AZITHROMYCIN 500 MG IV SOLR: 500.0000 mg | INTRAVENOUS | Status: DC

## 2016-01-02 MED ORDER — IPRATROPIUM BROMIDE 0.06 % NA SOLN
2.0000 | Freq: Two times a day (BID) | NASAL | Status: DC
  Administered 2016-01-02 – 2016-01-06 (×6): 2 via NASAL
  Filled 2016-01-02: qty 15

## 2016-01-02 MED ORDER — CYCLOSPORINE 0.05 % OP EMUL
1.0000 [drp] | Freq: Two times a day (BID) | OPHTHALMIC | Status: DC
  Administered 2016-01-02 – 2016-01-06 (×9): 1 [drp] via OPHTHALMIC
  Filled 2016-01-02 (×10): qty 1

## 2016-01-02 MED ORDER — ASPIRIN EC 81 MG PO TBEC
81.0000 mg | DELAYED_RELEASE_TABLET | ORAL | Status: DC
  Administered 2016-01-03 – 2016-01-06 (×4): 81 mg via ORAL
  Filled 2016-01-02 (×5): qty 1

## 2016-01-02 MED ORDER — WARFARIN - PHARMACIST DOSING INPATIENT
Freq: Every day | Status: DC
  Administered 2016-01-03: 18:00:00

## 2016-01-02 MED ORDER — SODIUM CHLORIDE 0.9 % IV BOLUS (SEPSIS)
500.0000 mL | Freq: Once | INTRAVENOUS | Status: AC
  Administered 2016-01-02: 500 mL via INTRAVENOUS

## 2016-01-02 MED ORDER — CLONAZEPAM 1 MG PO TABS
2.0000 mg | ORAL_TABLET | Freq: Every day | ORAL | Status: DC
  Administered 2016-01-02 – 2016-01-05 (×4): 2 mg via ORAL
  Filled 2016-01-02 (×4): qty 2

## 2016-01-02 MED ORDER — SODIUM CHLORIDE 0.9 % IV BOLUS (SEPSIS)
1000.0000 mL | Freq: Once | INTRAVENOUS | Status: AC
Start: 2016-01-02 — End: 2016-01-02
  Administered 2016-01-02: 1000 mL via INTRAVENOUS

## 2016-01-02 MED ORDER — ATENOLOL 50 MG PO TABS
25.0000 mg | ORAL_TABLET | Freq: Two times a day (BID) | ORAL | Status: DC
  Administered 2016-01-03 – 2016-01-06 (×7): 25 mg via ORAL
  Filled 2016-01-02 (×8): qty 1

## 2016-01-02 MED ORDER — PIPERACILLIN-TAZOBACTAM 3.375 G IVPB 30 MIN
3.3750 g | Freq: Once | INTRAVENOUS | Status: AC
  Administered 2016-01-02: 3.375 g via INTRAVENOUS
  Filled 2016-01-02: qty 50

## 2016-01-02 MED ORDER — WARFARIN SODIUM 5 MG PO TABS: 5.0000 mg | ORAL_TABLET | ORAL | Status: DC

## 2016-01-02 MED ORDER — FINASTERIDE 5 MG PO TABS
5.0000 mg | ORAL_TABLET | Freq: Every evening | ORAL | Status: DC
  Administered 2016-01-02 – 2016-01-06 (×5): 5 mg via ORAL
  Filled 2016-01-02 (×7): qty 1

## 2016-01-02 MED ORDER — SODIUM CHLORIDE 0.9 % IV SOLN
Freq: Once | INTRAVENOUS | Status: AC
  Administered 2016-01-02: 11:00:00 via INTRAVENOUS

## 2016-01-02 MED ORDER — GUAIFENESIN ER 600 MG PO TB12
600.0000 mg | ORAL_TABLET | Freq: Two times a day (BID) | ORAL | Status: DC
  Administered 2016-01-02 – 2016-01-06 (×9): 600 mg via ORAL
  Filled 2016-01-02 (×9): qty 1

## 2016-01-02 MED ORDER — ALBUTEROL SULFATE (2.5 MG/3ML) 0.083% IN NEBU: 2.5000 mg | INHALATION_SOLUTION | Freq: Four times a day (QID) | RESPIRATORY_TRACT | Status: DC | PRN

## 2016-01-02 NOTE — ED Notes (Signed)
PT reports he started coughing last night and had a fever. Pt was at PCP office on Friday for a sore throat . Pt tested neg, For Strep.

## 2016-01-02 NOTE — ED Notes (Signed)
Attempted report 

## 2016-01-02 NOTE — Progress Notes (Signed)
CRITICAL VALUE ALERT  Critical value received:  Lactic acid 2.4  Date of notification: 01/02/16  Time of notification:  [id number]  Critical value read back:Yes.    Nurse who received alert:  Jabier Mutton  MD notified (1st page):  Dr. Rockne Menghini  Time of first page:  69  Responding MD: Dr. Rockne Menghini  Time MD responded:  1554  MD ordered to have lactic acid recheck in 3 hours and to make sure pt receives the ordered fluid bolus.

## 2016-01-02 NOTE — H&P (Signed)
History and Physical:    [name]   [id number] DOB: July 09, 1929 DOA: 01/02/2016  Referring MD/provider: Dr. Winfred Leeds PCP: Irven Shelling, MD   Patient coming from: Home.  Chief Complaint: Fever  History of Present Illness:   [name] is an 82 y.o. male with a PMH of atrial fibrillation and sick sinus syndrome status post pacemaker, OSA on nasal CPAP, and prior history of aspiration pneumonia 3 presents with a chief complaint of upper respiratory symptoms. The patient's symptoms initially began 3 days ago with sore throat and cough. This was followed by progressive weakness and loss of energy. The patient saw his PCP on 01/01/16 where a rapid strep test was performed and was negative. His blood pressure was noted to be low at that time. This morning, the patient developed fever up to 103.5 at home with ongoing dry cough and chest congestion. According to family members, he was confused more than usual and was subsequently brought to the hospital via EMS.  ED Course: Code sepsis called in the ED. Chest x-ray negative for acute abnormalities. Urinalysis negative for nitrites but did show leukocytes and many bacteria. Specimen was contaminated with squamous cells. WBC 12.1. Lactic acid WNL. Given a dose of Rocephin and azithromycin as well as fluid volume resuscitation.  ROS:   Review of Systems  Constitutional: Positive for fever, chills and malaise/fatigue.  HENT: Negative.   Respiratory: Positive for cough and shortness of breath.   Cardiovascular: Positive for leg swelling.  Gastrointestinal: Negative.   Genitourinary: Negative.   Musculoskeletal: Negative.   Skin: Negative.   Neurological: Negative.   Endo/Heme/Allergies: Negative.   Psychiatric/Behavioral: Negative.     All other systems were reviewed are are negative. Past Medical History:   Past Medical History  Diagnosis Date  . Allergic rhinitis   . RLS (restless legs syndrome)   .  Permanent atrial fibrillation (HCC)     on coumadin  . Esophageal reflux   . Bronchitis   . Normal nuclear stress test 2013  . Transitional cell carcinoma ([place])     s/p nephrectomy in  2010  . Complete heart block ([place])     pacemaker originally placed in 1988  . BPH (benign prostatic hyperplasia)   . Lung nodule     noted on CXR December 2012  . Hypertension   . Dementia   . Pneumonia   . Renal insufficiency     Rt kidney removed d/t maglinant tumor  . Arthritis   . Presence of permanent cardiac pacemaker   . Stroke [facility])     TIA greater than 20 years  . OSA (obstructive sleep apnea)     uses cpap    Past Surgical History:   Past Surgical History  Procedure Laterality Date  . Pacemaker insertion  1988    most recent generator change by Dr Rayann Heman 20067/2/13 with new right ventricular lead placed  . Nephrectomy  2010    rt  . Hernia repair      multiple  . US echocardiography  04/22/2008    EF 55-60%  . Doppler echocardiography  03/26/2001    EF 55%  . Cardiovascular stress test  03/23/2009    EF 65%, NORMAL  . Eye surgery      tumor removed from Rt eye lid  . Mass excision Left 11/07/2012    Procedure: MINOR EXCISION OF MASS;  Surgeon: Haywood Lasso, MD;  Location: [place];  Service: General;  Laterality:  Left;  . Permanent pacemaker generator change N/A 01/03/2012    Procedure: PERMANENT PACEMAKER GENERATOR CHANGE;  Surgeon: Thompson Grayer, MD;  Location: [facility] CATH LAB;  Service: Cardiovascular;  Laterality: N/A;  . Cataract extraction w/phaco Left 08/26/2015    Procedure: CATARACT EXTRACTION PHACO AND INTRAOCULAR LENS PLACEMENT (IOC);  Surgeon: Marylynn Pearson, MD;  Location: [place];  Service: Ophthalmology;  Laterality: Left;    Social History:   Social History   Social History  . Marital Status: Married    Spouse Name: N/A  . Number of Children: N/A  . Years of Education: N/A   Occupational History  . Retired Freight forwarder    Social  History Main Topics  . Smoking status: Never Smoker   . Smokeless tobacco: Never Used  . Alcohol Use: No  . Drug Use: No  . Sexual Activity: Not on file   Other Topics Concern  . Not on file   Social History Narrative    Allergies   Review of patient's allergies indicates no known allergies.  Family history:   Family History  Problem Relation Age of Onset  . Stroke Mother   . Stroke Father     [place] Medications:   Prior to Admission medications   Medication Sig Start Date End Date Taking? Authorizing Provider  acetaminophen (TYLENOL) 325 MG tablet Take 650 mg by mouth daily as needed for mild pain or fever.   Yes Historical Provider, MD  aspirin 81 MG tablet Take 81 mg by mouth every morning.    Yes Historical Provider, MD  atenolol (TENORMIN) 25 MG tablet Take 1 tablet (25 mg total) by mouth 2 (two) times daily. 10/01/14  Yes Thompson Grayer, MD  clonazePAM (KLONOPIN) 2 MG tablet TAKE 1 TABLET AT BEDTIME FOR SLEEP AS NEEDED Patient taking differently: Take 2 mg by mouth at bedtime.  08/11/15  Yes Deneise Lever, MD  cycloSPORINE (RESTASIS) 0.05 % ophthalmic emulsion Place 1 drop into both eyes 2 (two) times daily.    Yes Historical Provider, MD  doxazosin (CARDURA) 2 MG tablet Take 2 mg by mouth daily.   Yes Historical Provider, MD  finasteride (PROSCAR) 5 MG tablet Take 1 tablet (5 mg total) by mouth daily. Patient taking differently: Take 5 mg by mouth every evening.  09/12/13  Yes Thompson Grayer, MD  furosemide (LASIX) 40 MG tablet Take 1 tablet (40 mg total) by mouth daily. 08/24/15  Yes Thompson Grayer, MD  galantamine (RAZADYNE) 8 MG tablet Take 8 mg by mouth 2 (two) times daily.   Yes Historical Provider, MD  glucosamine-chondroitin 500-400 MG tablet Take 2 tablets by mouth every evening.   Yes Historical Provider, MD  hydroxypropyl methylcellulose (ISOPTO TEARS) 2.5 % ophthalmic solution Place 1 drop into both eyes 4 (four) times daily as needed for dry eyes.    Yes Historical  Provider, MD  ipratropium (ATROVENT) 0.06 % nasal spray USE 2 SPRAYS IN EACH NOSTRIL THREE TIMES DAILY AS NEEDED FOR RHINITIS. Patient taking differently: USE 2 SPRAYS IN EACH NOSTRIL twice a day 06/24/15  Yes Deneise Lever, MD  Multiple Vitamin (MULTIVITAMIN) capsule Take 1 capsule by mouth daily.     Yes Historical Provider, MD  OVER THE COUNTER MEDICATION Apply 1 application topically at bedtime. Eye gel   Yes Historical Provider, MD  warfarin (COUMADIN) 5 MG tablet Take 2.5-5 mg by mouth daily at 6 PM. Take 1/2 tablet (2.5mg ) on Mon, Wed, and Fri. Take 1 tablet (5mg ) on Sun, Tues, Thurs,  and Sat.   Yes Historical Provider, MD  ciprofloxacin (CIPRO) 500 MG tablet Take 500 mg by mouth every 12 (twelve) hours. For 7 days 01/01/16   Historical Provider, MD  metroNIDAZOLE (FLAGYL) 500 MG tablet Take 500 mg by mouth every 8 (eight) hours. For 7 days 01/01/16   Historical Provider, MD    Physical Exam:   Filed Vitals:   01/02/16 1300 01/02/16 1330 01/02/16 1336 01/02/16 1345  BP: 109/69 90/61 90/61  104/54  Pulse: 70 69 70 69  Temp:      TempSrc:      Resp: 15 20 23 17   Height:      Weight:      SpO2: 94% 96% 94% 93%     Physical Exam: Blood pressure 104/54, pulse 69, temperature 103 F (39.4 C), temperature source Oral, resp. rate 17, height 5\' 10"  (1.778 m), weight 77.111 kg (170 lb), SpO2 93 %. Gen: No acute distress. Head: Normocephalic, atraumatic. Eyes: Pupils equal, round and reactive to light. Extraocular movements intact.  Sclerae nonicteric. Mouth: Oropharynx reveals Mildly dry mucous membranes, fair dentition. Neck: Supple, no thyromegaly, no lymphadenopathy, no jugular venous distention. Chest: Lungs are coarse with bilateral crackles in the bases.  CV: Heart sounds are regular with an S1, S2. No rubs, clicks, or gallops, grade 2/6 systolic ejection murmur. Abdomen: Soft, nontender, nondistended with normal active bowel sounds. Extremities: Extremities show 1+ edema with  hemosiderin deposits bilaterally. Skin: Warm and dry. No rashes, lesions or wounds. Neuro: Alert and oriented times 2; grossly nonfocal.  Psych: Insight is good and judgment is appropriate. Mood and affect normal.   Data Review:    Labs: Basic Metabolic Panel:  Recent Labs Lab 01/02/16 1105  NA 139  K 4.2  CL 109  CO2 25  GLUCOSE 136*  BUN 23*  CREATININE 1.89*  CALCIUM 9.4   Liver Function Tests:  Recent Labs Lab 01/02/16 1105  AST 22  ALT 18  ALKPHOS 93  BILITOT 1.8*  PROT 6.8  ALBUMIN 3.5   No results for input(s): LIPASE, AMYLASE in the last 168 hours. No results for input(s): AMMONIA in the last 168 hours. CBC:  Recent Labs Lab 01/02/16 1105  WBC 12.1*  NEUTROABS 11.2*  HGB 12.7*  HCT 40.3  MCV 100.8*  PLT 150   Cardiac Enzymes: No results for input(s): CKTOTAL, CKMB, CKMBINDEX, TROPONINI in the last 168 hours.  BNP (last 3 results) No results for input(s): PROBNP in the last 8760 hours. CBG: No results for input(s): GLUCAP in the last 168 hours.  Urinalysis    Component Value Date/Time   COLORURINE AMBER* 01/02/2016 1128   APPEARANCEUR CLOUDY* 01/02/2016 1128   LABSPEC 1.026 01/02/2016 1128   PHURINE 5.5 01/02/2016 1128   GLUCOSEU NEGATIVE 01/02/2016 1128   HGBUR MODERATE* 01/02/2016 1128   BILIRUBINUR SMALL* 01/02/2016 1128   KETONESUR 15* 01/02/2016 1128   PROTEINUR 100* 01/02/2016 1128   NITRITE NEGATIVE 01/02/2016 1128   LEUKOCYTESUR MODERATE* 01/02/2016 1128      Radiographic Studies: Dg Chest 2 View  01/02/2016  CLINICAL DATA:  Dry cough with sore throat for several days, initial encounter EXAM: CHEST  2 VIEW COMPARISON:  08/07/2014 FINDINGS: Cardiac shadow remains enlarged. A pacing device is again seen and stable. The lungs are well aerated bilaterally. Mild increased density is noted in the region of the middle lobe on the lateral projection without definitive infiltrate on the frontal images. This is likely related to some  scarring as seen on  previous CTs. No sizable effusion is noted. No bony abnormality is seen. IMPRESSION: No acute abnormality noted. Electronically Signed   By: Inez Catalina M.D.   On: 01/02/2016 11:52    EKG: Independently reviewed. Sinus rhythm with LVH and Q waves in 3 and aVF.   Assessment/Plan:   Principal Problem:   Sepsis ([place]) Source likely upper respiratory, but urinary source also possible. Sepsis order set utilized. Fluid volume resuscitated in the ED. Lactic acid is not elevated which is reassuring. Check pro-calcitonin. Blood and urine cultures were sent. We'll treat with Zosyn which will cover urinary pathogens and aspiration pathogens since the patient has a history of aspiration pneumonia in the past. Will get speech therapy evaluation as well. Send respiratory virus panel.  Active Problems:   Obstructive sleep apnea CPAP per home regimen ordered.    FIBRILLATION, ATRIAL Status post pacemaker. Continue Coumadin per pharmacy dosing. Continue atenolol. Rate currently controlled.    Esophageal reflux Symptomatic treatment.   Other information:   DVT prophylaxis: Coumadin ordered. Code Status: Full code. Family Communication: Wife at the bedside. Disposition Plan: Home in 2-3 days if afebrile. Consults called: None. Admission status: Inpatient. Medical-surgical bed.  Time spent: One hour.  RAMA,CHRISTINA [facility] Pager [phone] Cell: [phone]   If 7PM-7AM, please contact night-coverage [email or web address] Password [email or web address] 01/02/2016, 2:26 PM

## 2016-01-02 NOTE — Progress Notes (Signed)
CRITICAL VALUE ALERT  Critical value received: lactic acid 3.0  Date of notification:  7/1  Time of notification:  1939  Critical value read back:yes  Nurse who received alert:  Arthor Captain LPN  MD notified (1st page): L.[place]  Time of first page:  1945  MD notified 2nd page  L [place]  Time of second page:2010  Responding MD: [facility]   Time MD responded:  2030

## 2016-01-02 NOTE — Progress Notes (Signed)
ANTICOAGULATION CONSULT NOTE - Initial Consult  Pharmacy Consult for warfarin Indication: atrial fibrillation  No Known Allergies  Patient Measurements: Height: 5\' 10"  (177.8 cm) Weight: 170 lb (77.111 kg) IBW/kg (Calculated) : 73  Vital Signs: Temp: 100.4 F (38 C) (07/01 1445) Temp Source: Oral (07/01 1445) BP: 108/53 mmHg (07/01 1445) Pulse Rate: 70 (07/01 1445)  Labs:  Recent Labs  01/02/16 1105  HGB 12.7*  HCT 40.3  PLT 150  LABPROT 24.2*  INR 2.20*  CREATININE 1.89*    Estimated Creatinine Clearance: 30 mL/min (by C-G formula based on Cr of 1.89).  Assessment: 71 YOM on warfarin for AFib, to be continued here. Home dose if 5mg  daily except 2.5mg  on MWF. INR therapeutic on admission at 2.2. Last dose taken 6/30. Hgb 12.7, plts 150- no bleeding noted.  Goal of Therapy:  INR 2-3 Monitor platelets by anticoagulation protocol: Yes   Plan:  -continue home warfarin-5mg  daily except 2.5mg  on MWF -daily INR for now -follow for s/s bleeding  Leonia Heatherly D. Stone Spirito, PharmD, BCPS Clinical Pharmacist Pager: [phone] 01/02/2016 3:16 PM

## 2016-01-02 NOTE — Progress Notes (Addendum)
Pt fluid bolus completed at 1830 and VS to be rechecked in 1 hr as ordered. Pt remains in bed with call light within reach and no acute changes noted. Reported off to coming RN. Delia Heady RN

## 2016-01-02 NOTE — Progress Notes (Addendum)
Pharmacy Antibiotic Note  [name] is a 81 y.o. male admitted on 01/02/2016 with SOB/fever concerning for PNA/sepsis. Pharmacy has been consulted for Rocephin + Azithromycin dosing for r/o CAP.  Code sepsis called at 52 - antibiotics were delivered at 1125.  Plan: 1. Rocephin 1g IV every 24 hours 2. Azithromycin 500 mg IV every 24 hours 3. Pharmacy will sign off as no further dose adjustments are expected at this time.   Height: 5\' 10"  (177.8 cm) Weight: 170 lb (77.111 kg) IBW/kg (Calculated) : 73  Temp (24hrs), Avg:104.4 F (40.2 C), Min:104.4 F (40.2 C), Max:104.4 F (40.2 C)  No results for input(s): WBC, CREATININE, LATICACIDVEN, VANCOTROUGH, VANCOPEAK, VANCORANDOM, GENTTROUGH, GENTPEAK, GENTRANDOM, TOBRATROUGH, TOBRAPEAK, TOBRARND, AMIKACINPEAK, AMIKACINTROU, AMIKACIN in the last 168 hours.  CrCl cannot be calculated (Patient has no serum creatinine result on file.).    No Known Allergies  Antibiotics this admission: Azith 7/1 x1 CTX 7/1 x1 Zosyn 7/1>>   Microbiology: 7/1 urine: sent 7/1 Bcx: sent 7/1 resp virus panel: ordered  Thank you for allowing pharmacy to be a part of this patient's care.  Lawson Radar 01/02/2016 11:13 AM   ADDENDUM D/t patient's history of aspiration PNA, antibiotics have been changed to Zosyn.  Plan: -Zosyn 3.375g IV q8h EI -follow c/s, renal function, de-escalation  Gustavus Haskin D. Eulah Walkup, PharmD, BCPS Clinical Pharmacist Pager: [phone] 01/02/2016 3:17 PM

## 2016-01-02 NOTE — Progress Notes (Signed)
[facility] paged twice with critical lactic acid levels temp 102.4 resp 22 650mg  Tylenol given and rapid response called. Will continue to monitor. Arthor Captain LPN

## 2016-01-02 NOTE — Progress Notes (Signed)
Pt arrived to the unit from ED with IV intact and transfusing; pt A&O x4; family at bedside; pt oriented to the unit and room; VS taken, MD notified of pt lactic acid results and order was received to recheck pt lactic acid in 3 hours; pt placed on droplet precaution d/t respiratory panel order sent; pt has rhonchi/rales in bilateral lungs upon assessment; pt in bed with call light within reach and will continue to monitor closely. Delia Heady RN

## 2016-01-02 NOTE — ED Provider Notes (Signed)
CSN: [id number]     Arrival date & time 01/02/16  1036 History   First MD Initiated Contact with Patient 01/02/16 1040     Chief Complaint  Patient presents with  . Fever  . Cough   HPI  [name] is a 82 y.o. male PMH significant for right nephrectomy, HTN, a-fib (on Coumadin), complete heart block (s/p pacemaker) presenting with a fever and cough since last night. His family member, present at bedside, states that he has had a sore throat since Wednesday, was tested at his primary care office and had a negative strep test. He denies CP, SOB, abdominal pain, N/V, changes in bowel/bladder habits.   His family member endorses a history of aspiration pneumonia several years ago.  Past Medical History  Diagnosis Date  . Allergic rhinitis   . RLS (restless legs syndrome)   . Permanent atrial fibrillation (HCC)     on coumadin  . Esophageal reflux   . Bronchitis   . Normal nuclear stress test 2013  . Transitional cell carcinoma ([place])     s/p nephrectomy in  2010  . Complete heart block ([place])     pacemaker originally placed in 1988  . BPH (benign prostatic hyperplasia)   . Lung nodule     noted on CXR December 2012  . Hypertension   . Dementia   . Pneumonia   . Renal insufficiency     Rt kidney removed d/t maglinant tumor  . Arthritis   . Presence of permanent cardiac pacemaker   . Stroke [facility])     TIA greater than 20 years  . OSA (obstructive sleep apnea)     uses cpap   Past Surgical History  Procedure Laterality Date  . Pacemaker insertion  1988    most recent generator change by Dr Rayann Heman 20067/2/13 with new right ventricular lead placed  . Nephrectomy  2010    rt  . Hernia repair      multiple  . US echocardiography  04/22/2008    EF 55-60%  . Doppler echocardiography  03/26/2001    EF 55%  . Cardiovascular stress test  03/23/2009    EF 65%, NORMAL  . Eye surgery      tumor removed from Rt eye lid  . Mass excision Left 11/07/2012    Procedure: MINOR EXCISION  OF MASS;  Surgeon: Haywood Lasso, MD;  Location: [place];  Service: General;  Laterality: Left;  . Permanent pacemaker generator change N/A 01/03/2012    Procedure: PERMANENT PACEMAKER GENERATOR CHANGE;  Surgeon: Thompson Grayer, MD;  Location[facility] C[facility] LAB;  Service: Cardiovascular;  Laterality: N/A;  . Cataract extraction w/phaco Left 08/26/2015    Procedure: CATARACT EXTRACTION PHACO AND INTRAOCULAR LENS PLACEMENT (IOC);  Surgeon: Marylynn Pearson, MD;  Location: [place];  Service: Ophthalmology;  Laterality: Left;   Family History  Problem Relation Age of Onset  . Stroke Mother   . Stroke Father    Social History  Substance Use Topics  . Smoking status: Never Smoker   . Smokeless tobacco: Never Used  . Alcohol Use: No    Review of Systems  Ten systems are reviewed and are negative for acute change except as noted in the HPI  Allergies  Review of patient's allergies indicates no known allergies.  Home Medications   Prior to Admission medications   Medication Sig Start Date End Date Taking? Authorizing Provider  aspirin 81 MG tablet Take 81 mg by mouth daily.  Historical Provider, MD  atenolol (TENORMIN) 25 MG tablet Take 1 tablet (25 mg total) by mouth 2 (two) times daily. 10/01/14   Thompson Grayer, MD  clonazePAM (KLONOPIN) 2 MG tablet TAKE 1 TABLET AT BEDTIME FOR SLEEP AS NEEDED 08/11/15   Deneise Lever, MD  cycloSPORINE (RESTASIS) 0.05 % ophthalmic emulsion Place 1 drop into both eyes 2 (two) times daily.     Historical Provider, MD  doxazosin (CARDURA) 2 MG tablet Take 2 mg by mouth daily.    Historical Provider, MD  finasteride (PROSCAR) 5 MG tablet Take 1 tablet (5 mg total) by mouth daily. 09/12/13   Thompson Grayer, MD  furosemide (LASIX) 40 MG tablet Take 1 tablet (40 mg total) by mouth daily. 08/24/15   Thompson Grayer, MD  galantamine (RAZADYNE) 8 MG tablet Take 8 mg by mouth 2 (two) times daily.    Historical Provider, MD  glucosamine-chondroitin 500-400 MG  tablet Take 2 tablets by mouth every evening.    Historical Provider, MD  hydroxypropyl methylcellulose (ISOPTO TEARS) 2.5 % ophthalmic solution Place 1 drop into both eyes 4 (four) times daily as needed for dry eyes.     Historical Provider, MD  ipratropium (ATROVENT) 0.06 % nasal spray USE 2 SPRAYS IN EACH NOSTRIL THREE TIMES DAILY AS NEEDED FOR RHINITIS. 06/24/15   Deneise Lever, MD  Multiple Vitamin (MULTIVITAMIN) capsule Take 1 capsule by mouth daily.      Historical Provider, MD  warfarin (COUMADIN) 5 MG tablet Take 2.5-5 mg by mouth daily. Take 1/2 tablet (2.5mg ) on Mon, Wed, and Fri. Take 1 tablet (5mg ) on Sun, Tues, Thurs, and Sat.    Historical Provider, MD   BP 122/60 mmHg  Pulse 72  Temp(Src) 104.4 F (40.2 C) (Rectal)  Resp 24  Ht 5\' 10"  (1.778 m)  Wt 77.111 kg  BMI 24.39 kg/m2  SpO2 97% Physical Exam  Constitutional: He appears well-developed and well-nourished. No distress.  HENT:  Head: Normocephalic and atraumatic.  Mouth/Throat: Oropharynx is clear and moist. No oropharyngeal exudate.  Eyes: Conjunctivae are normal. Pupils are equal, round, and reactive to light. Right eye exhibits no discharge. Left eye exhibits no discharge. No scleral icterus.  Neck: No tracheal deviation present.  Cardiovascular: Normal rate, regular rhythm, normal heart sounds and intact distal pulses.  Exam reveals no gallop and no friction rub.   No murmur heard. Pulmonary/Chest: Effort normal. No respiratory distress. He has no wheezes. He has rales. He exhibits no tenderness.  Rales BL lower lobes  Abdominal: Soft. Bowel sounds are normal. He exhibits no distension and no mass. There is no tenderness. There is no rebound and no guarding.  Musculoskeletal: He exhibits no edema.  Lymphadenopathy:    He has no cervical adenopathy.  Neurological: He is alert. Coordination normal.  Skin: Skin is warm and dry. No rash noted. He is not diaphoretic. No erythema.  Psychiatric: He has a normal mood  and affect. His behavior is normal.  Nursing note and vitals reviewed.   ED Course  Procedures  Labs Review Labs Reviewed  COMPREHENSIVE METABOLIC PANEL - Abnormal; Notable for the following:    Glucose, Bld 136 (*)    BUN 23 (*)    Creatinine, Ser 1.89 (*)    Total Bilirubin 1.8 (*)    GFR calc non Af Amer 31 (*)    GFR calc Af Amer 36 (*)    All other components within normal limits  CBC WITH DIFFERENTIAL/PLATELET - Abnormal; Notable for the  following:    WBC 12.1 (*)    RBC 4.00 (*)    Hemoglobin 12.7 (*)    MCV 100.8 (*)    Neutro Abs 11.2 (*)    Lymphs Abs 0.5 (*)    All other components within normal limits  URINALYSIS, ROUTINE W REFLEX MICROSCOPIC (NOT AT [facility]) - Abnormal; Notable for the following:    Color, Urine AMBER (*)    APPearance CLOUDY (*)    Hgb urine dipstick MODERATE (*)    Bilirubin Urine SMALL (*)    Ketones, ur 15 (*)    Protein, ur 100 (*)    Leukocytes, UA MODERATE (*)    All other components within normal limits  PROTIME-INR - Abnormal; Notable for the following:    Prothrombin Time 24.2 (*)    INR 2.20 (*)    All other components within normal limits  URINE MICROSCOPIC-ADD ON - Abnormal; Notable for the following:    Squamous Epithelial / LPF 6-30 (*)    Bacteria, UA MANY (*)    All other components within normal limits  CULTURE, BLOOD (ROUTINE X 2)  CULTURE, BLOOD (ROUTINE X 2)  URINE CULTURE  I-STAT CG4 LACTIC ACID, ED  I-STAT CG4 LACTIC ACID, ED    Imaging Review Dg Chest 2 View  01/02/2016  CLINICAL DATA:  Dry cough with sore throat for several days, initial encounter EXAM: CHEST  2 VIEW COMPARISON:  08/07/2014 FINDINGS: Cardiac shadow remains enlarged. A pacing device is again seen and stable. The lungs are well aerated bilaterally. Mild increased density is noted in the region of the middle lobe on the lateral projection without definitive infiltrate on the frontal images. This is likely related to some scarring as seen on previous  CTs. No sizable effusion is noted. No bony abnormality is seen. IMPRESSION: No acute abnormality noted. Electronically Signed   By: Inez Catalina M.D.   On: 01/02/2016 11:52   I have personally reviewed and evaluated these images and lab results as part of my medical decision-making.   EKG Interpretation   Date/Time:  Saturday January 02 2016 10:51:15 EDT Ventricular Rate:  70 PR Interval:    QRS Duration: 152 QT Interval:  409 QTC Calculation: 442 R Axis:   -83 Text Interpretation:  Sinus rhythm IVCD, consider atypical RBBB LVH with  secondary repolarization abnormality Inferior infarct, acute Anterior  infarct, old Lateral leads are also involved No significant change since  last tracing Confirmed by Winfred Leeds  MD, SAM [phone]) on 01/02/2016 11:16:46  AM Also confirmed by Winfred Leeds  MD, SAM [phone])  on 01/02/2016 11:17:43 AM      MDM   Final diagnoses:  SIRS (systemic inflammatory response syndrome) (HCC)  Elevated serum creatinine   Patient meets SIRS criteria; code sepsis initiated. Rectal temp of 104.4. Patient febrile with likely respiratory source; however, question urinary source. UA is borderline (moderate leukocytes, too numerous to count WBC, many bacteria but 6-30 squamous epithelials). Urine culture sent. Patient was given rocephin. Patient's SCr was elevated today at 1.89 from 1.48 in February. Given this and borderline UA in a patient with a history of nephrectomy, this is also concerning. Patient will need to be admitted.  Dr. Rockne Menghini advised hospital admission to [facility] [place]. Patient and family in understanding and agreement with the plan.    Cowley Lions, PA-C 01/02/16 1421  Orlie Dakin, MD 01/02/16 1729

## 2016-01-02 NOTE — ED Provider Notes (Signed)
Complains of mild nonproductive cough onset this morning. He had a sore throat onset 2 days ago which has since resolved. No nausea or vomiting. No treatment prior to coming here. On exam alert, chronically ill-appearing, no distress nontoxic heart regular rhythm lungs crackles right side posteriorly no respiratory distress abdomen nondistended nontender extremities without edema. Code sepsis called based on service criteria of fever, tachypnea. Likely source of infection respiratory  Orlie Dakin, MD 01/02/16 1728

## 2016-01-02 NOTE — ED Notes (Signed)
Per PA, to hold fluid bolus until lactic acid results

## 2016-01-03 ENCOUNTER — Inpatient Hospital Stay (HOSPITAL_COMMUNITY): Payer: PPO

## 2016-01-03 DIAGNOSIS — Z950 Presence of cardiac pacemaker: Secondary | ICD-10-CM | POA: Diagnosis not present

## 2016-01-03 DIAGNOSIS — N390 Urinary tract infection, site not specified: Secondary | ICD-10-CM

## 2016-01-03 DIAGNOSIS — A419 Sepsis, unspecified organism: Secondary | ICD-10-CM | POA: Diagnosis not present

## 2016-01-03 DIAGNOSIS — G4733 Obstructive sleep apnea (adult) (pediatric): Secondary | ICD-10-CM | POA: Diagnosis not present

## 2016-01-03 DIAGNOSIS — Z7901 Long term (current) use of anticoagulants: Secondary | ICD-10-CM

## 2016-01-03 DIAGNOSIS — I482 Chronic atrial fibrillation: Secondary | ICD-10-CM

## 2016-01-03 DIAGNOSIS — J189 Pneumonia, unspecified organism: Secondary | ICD-10-CM | POA: Diagnosis not present

## 2016-01-03 DIAGNOSIS — J209 Acute bronchitis, unspecified: Secondary | ICD-10-CM | POA: Diagnosis not present

## 2016-01-03 LAB — STREP PNEUMONIAE URINARY ANTIGEN: STREP PNEUMO URINARY ANTIGEN: NEGATIVE

## 2016-01-03 LAB — LACTIC ACID, PLASMA
LACTIC ACID, VENOUS: 1 mmol/L (ref 0.5–1.9)
Lactic Acid, Venous: 1.5 mmol/L (ref 0.5–1.9)

## 2016-01-03 LAB — MRSA PCR SCREENING: MRSA by PCR: NEGATIVE

## 2016-01-03 LAB — PROTIME-INR
INR: 2.61 — AB (ref 0.00–1.49)
Prothrombin Time: 27.6 seconds — ABNORMAL HIGH (ref 11.6–15.2)

## 2016-01-03 MED ORDER — CHLORHEXIDINE GLUCONATE 0.12 % MT SOLN
15.0000 mL | Freq: Two times a day (BID) | OROMUCOSAL | Status: DC
  Administered 2016-01-03 – 2016-01-06 (×7): 15 mL via OROMUCOSAL
  Filled 2016-01-03 (×7): qty 15

## 2016-01-03 MED ORDER — CETYLPYRIDINIUM CHLORIDE 0.05 % MT LIQD
7.0000 mL | Freq: Two times a day (BID) | OROMUCOSAL | Status: DC
  Administered 2016-01-03 – 2016-01-06 (×7): 7 mL via OROMUCOSAL

## 2016-01-03 MED ORDER — IPRATROPIUM-ALBUTEROL 0.5-2.5 (3) MG/3ML IN SOLN: 3.0000 mL | Freq: Three times a day (TID) | RESPIRATORY_TRACT | Status: DC

## 2016-01-03 NOTE — Significant Event (Addendum)
Rapid Response Event Note  Overview: Time Called: 2014 Arrival Time: 2045 Event Type: Other (Comment)  Initial Focused Assessment:  Called by bedside RN to evaluate patient.  Patient arrived to hospital earlier today for fever, sore throat, and cough, Code Sepsis initiated in the ED.  Bedside RN reported Lactic Acid of 3.0 and Temp of 102.4.  Bedside gave 650mg  of Tylenol and paged MD.  Patient has PMHx of AFIB/Sick Sinus (PPM placed), aspiration pneumonia x 3, OSA (CPAP at home), s/p R nepherectomy, and renal insufficiency.  UTI ? Aspiration PNA ? Fluid resuscitation started in the ED and continued on 5N.   Interventions: Patient was alert and oriented upon arrival, patient was talking and was able to state why he was in the hospital.  Patient appeared flushed, VSS were SBP in the upper 90-110s and MAP > 60, HR 70-72 paced via ppm, patient was on 2L [place] and oxygen sats were greater than 95% Lung sounds clear in the upper, slightly diminished in the lower lung fields, abdomen was distended, active bowel sounds.  MD at bedside, ordered: - 2 L fluids. Bedside RN initiated (Overall patient received total 6L fluids) - Bladder scan: ~ 80cc - CT renal study - CXR  Plan of Care (if not transferred): Vitals were monitored in the room, patient voided 50cc of concentrated urine, patient placed back in bed and was sent for CT.  RN instructed on monitoring VS and patient.  RN called at 2215, 0025, and 0130 for updates. Patient's last temp was 99.3, patient is on CPAP, BP stable 100-110s/50s-60s, PPM at 70. Repeat labs were obtained, Lactic Acid of 1.0, CR stable at 1.8  Event Summary: Name of Physician Notified: Dr. Hal Hope at 2045    at    Outcome: Stayed in room and stabalized  Event End Time: 2120  Friend, [place]

## 2016-01-03 NOTE — Progress Notes (Signed)
ANTICOAGULATION CONSULT NOTE  Pharmacy Consult for warfarin Indication: atrial fibrillation  No Known Allergies  Patient Measurements: Height: 5\' 10"  (177.8 cm) Weight: 170 lb (77.111 kg) IBW/kg (Calculated) : 73  Vital Signs: Temp: 99.9 F (37.7 C) (07/02 0800) Temp Source: Oral (07/02 0800) BP: 109/62 mmHg (07/02 0800) Pulse Rate: 69 (07/02 0800)  Labs:  Recent Labs  01/02/16 1105 01/02/16 2305 01/03/16 0503  HGB 12.7* 10.8*  --   HCT 40.3 33.9*  --   PLT 150 122*  --   LABPROT 24.2*  --  27.6*  INR 2.20*  --  2.61*  CREATININE 1.89* 1.80*  --     Estimated Creatinine Clearance: 31.5 mL/min (by C-G formula based on Cr of 1.8).  Assessment: [name] on warfarin for AFib, to be continued here. Home dose if 5mg  daily except 2.5mg  on MWF. INR therapeutic on admission at 2.2. Last dose taken 6/30.  INR this morning 2.61. Hgb 10.2, plts 122- no bleeding noted.  Goal of Therapy:  INR 2-3 Monitor platelets by anticoagulation protocol: Yes   Plan:  -continue home warfarin-5mg  daily except 2.5mg  on MWF -daily INR for now, if remains stable can decrease frequency -follow for s/s bleeding  Daiquan Resnik D. Phebe Dettmer, PharmD, BCPS Clinical Pharmacist Pager: [phone] 01/03/2016 10:59 AM

## 2016-01-03 NOTE — Progress Notes (Signed)
PROGRESS NOTE  [name] [id number] DOB: 1929-11-07 DOA: 01/02/2016 PCP: Irven Shelling, MD  HPI/Recap of past 24 hours:  Fever subsided, on room air, but persistent nonproductive cough, c/o poor appetite, abdominal distention and diarrhea, denies ab pain, no n/v,  Family report patient is slightly more confused than normal ( h/o mild dementia)  Assessment/Plan: Principal Problem:   Sepsis ([place]) Active Problems:   Obstructive sleep apnea   FIBRILLATION, ATRIAL   Esophageal reflux   SIRS (systemic inflammatory response syndrome) (HCC)    Sepsis with fever of 104.4, RR 22, leukocytosis wbc>12, lactic acidosis, cr elevation, source of infection from bronchitis/pna/uti, patient does has respiratory symptom, but denies urinary symptom. S/p ivf, On abx, better. Off ivf.  PNA: community acquired vs aspiration pna. Initial cxr ? Developing pna, CT renal did reveal left low lobe opacity and possible small pleural effusion.  He does has congested cough, on exam with significant rhonchi, more on the left, Will get CT chest for further eval, continue zosyn, mucinex, add nebs. Swallow eval ordered.   UTI?: he does has h/o bph, he denies significant urinary symptom, urine culture pending. Already on abx.  CKDIII, cr close to baseline, close monitor, renal dosing meds  Diarrhea? No ab pain, does have Abdominal distension, will check c diff. Possible some mild ileus.  Afib, with h/o cva, h/o heart block s/p pacemaker, currently paced rhythm, on  Atenolol, warfarin. inr therapeutic, pharmacy to dose coumadin.  osa on cpap  Mild dementia: baseline independent, still drives per family.     Code Status: full  Family Communication: patient  And wife in room and daughter stephanie who is a NP over the phone  Disposition Plan: home when medically stable in 1-2 days   Consultants:  none  Procedures:  none  Antibiotics:  zosyn   Objective: BP 109/62 mmHg  Pulse 69   Temp(Src) 98.7 F (37.1 C) (Oral)  Resp 20  Ht 5\' 10"  (1.778 m)  Wt 77.111 kg (170 lb)  BMI 24.39 kg/m2  SpO2 97%  Intake/Output Summary (Last 24 hours) at 01/03/16 0915 Last data filed at 01/03/16 0900  Gross per 24 hour  Intake    410 ml  Output    175 ml  Net    235 ml   Filed Weights   01/02/16 1045  Weight: 77.111 kg (170 lb)    Exam:   General:  NAD  Cardiovascular: paced rhythm   Respiratory: coarse, significant rhonchi , more on left side  Abdomen: Soft/ND/NT, positive BS  Musculoskeletal: trace pitting edema on left leg ( per wife this is at baseline)  Neuro: aaox3  Data Reviewed: Basic Metabolic Panel:  Recent Labs Lab 01/02/16 1105 01/02/16 2305  NA 139 137  K 4.2 4.0  CL 109 112*  CO2 25 21*  GLUCOSE 136* 136*  BUN 23* 27*  CREATININE 1.89* 1.80*  CALCIUM 9.4 8.1*   Liver Function Tests:  Recent Labs Lab 01/02/16 1105  AST 22  ALT 18  ALKPHOS 93  BILITOT 1.8*  PROT 6.8  ALBUMIN 3.5   No results for input(s): LIPASE, AMYLASE in the last 168 hours. No results for input(s): AMMONIA in the last 168 hours. CBC:  Recent Labs Lab 01/02/16 1105 01/02/16 2305  WBC 12.1* 12.0*  NEUTROABS 11.2*  --   HGB 12.7* 10.8*  HCT 40.3 33.9*  MCV 100.8* 102.4*  PLT 150 122*   Cardiac Enzymes:   No results for input(s): CKTOTAL, CKMB, CKMBINDEX,  TROPONINI in the last 168 hours. BNP (last 3 results) No results for input(s): BNP in the last 8760 hours.  ProBNP (last 3 results) No results for input(s): PROBNP in the last 8760 hours.  CBG: No results for input(s): GLUCAP in the last 168 hours.  Recent Results (from the past 240 hour(s))  Respiratory Panel by PCR     Status: None   Collection Time: 01/02/16  4:52 PM  Result Value Ref Range Status   Adenovirus NOT DETECTED NOT DETECTED Final   Coronavirus 229E NOT DETECTED NOT DETECTED Final   Coronavirus HKU1 NOT DETECTED NOT DETECTED Final   Coronavirus NL63 NOT DETECTED NOT DETECTED  Final   Coronavirus OC43 NOT DETECTED NOT DETECTED Final   Metapneumovirus NOT DETECTED NOT DETECTED Final   Rhinovirus / Enterovirus NOT DETECTED NOT DETECTED Final   Influenza A NOT DETECTED NOT DETECTED Final   Influenza A H1 NOT DETECTED NOT DETECTED Final   Influenza A H1 2009 NOT DETECTED NOT DETECTED Final   Influenza A H3 NOT DETECTED NOT DETECTED Final   Influenza B NOT DETECTED NOT DETECTED Final   Parainfluenza Virus 1 NOT DETECTED NOT DETECTED Final   Parainfluenza Virus 2 NOT DETECTED NOT DETECTED Final   Parainfluenza Virus 3 NOT DETECTED NOT DETECTED Final   Parainfluenza Virus 4 NOT DETECTED NOT DETECTED Final   Respiratory Syncytial Virus NOT DETECTED NOT DETECTED Final   Bordetella pertussis NOT DETECTED NOT DETECTED Final   Chlamydophila pneumoniae NOT DETECTED NOT DETECTED Final   Mycoplasma pneumoniae NOT DETECTED NOT DETECTED Final     Studies: Dg Chest 2 View  01/02/2016  CLINICAL DATA:  82 year old male with shortness of breath and cough and fever. Possible pneumonia. EXAM: CHEST  2 VIEW COMPARISON:  Chest radiograph dated 01/02/2016 FINDINGS: Two views of the chest demonstrate stable moderate cardiomegaly with central vascular prominence compatible with congestive changes. Bibasilar linear densities, likely atelectatic changes. There is no focal consolidation, pleural effusion, or pneumothorax. Right pectoral pacemaker device. There is osteopenia with degenerative changes of the spine. No acute fracture. IMPRESSION: Cardiomegaly with mild congestive changes. Bibasilar atelectatic changes. Developing pneumonia is not excluded. Clinical correlation is recommended. No focal consolidation. Electronically Signed   By: Anner Crete M.D.   On: 01/02/2016 23:07   Dg Chest 2 View  01/02/2016  CLINICAL DATA:  Dry cough with sore throat for several days, initial encounter EXAM: CHEST  2 VIEW COMPARISON:  08/07/2014 FINDINGS: Cardiac shadow remains enlarged. A pacing device is  again seen and stable. The lungs are well aerated bilaterally. Mild increased density is noted in the region of the middle lobe on the lateral projection without definitive infiltrate on the frontal images. This is likely related to some scarring as seen on previous CTs. No sizable effusion is noted. No bony abnormality is seen. IMPRESSION: No acute abnormality noted. Electronically Signed   By: Inez Catalina M.D.   On: 01/02/2016 11:52   Ct Renal Stone Study  01/02/2016  CLINICAL DATA:  Acute renal failure.  Fever.  Possible sepsis. EXAM: CT ABDOMEN AND PELVIS WITHOUT CONTRAST TECHNIQUE: Multidetector CT imaging of the abdomen and pelvis was performed following the standard protocol without IV contrast. COMPARISON:  06/14/2011 FINDINGS: There are unremarkable unenhanced appearances of the liver, gallbladder and bile ducts. There are unremarkable unenhanced appearances of the pancreas, spleen and adrenals except for a few splenic granulomata. There are unremarkable appearances of the right nephrectomy bed. Left kidney is negative for hydronephrosis. There is  an upper pole 2.5 cm left renal cyst, decreased in size from 06/14/2011 when it measured 5.9 cm. There also is a 2 cm cyst of the lateral aspect of the left renal midpole, unchanged. The left collecting system and ureter are unremarkable. Urinary bladder is nearly completely empty but grossly unremarkable. The abdominal aorta is normal in caliber. There is mild atherosclerotic calcification. There is no adenopathy in the abdomen or pelvis. Bowel is unremarkable. There is no adenopathy.  There is small volume perihepatic ascites. No significant skeletal lesion. Moderate lumbar facet arthritis from L4 through S1 with a grade 1 spondylolisthesis at L4-5. Noncalcified 5.5 mm nodule in the posterior right lower lobe periphery, axial image 4 series 205. This is indeterminate. Mild stable scarring in the right middle lobe and lingular base. Mild patchy atelectatic  appearing opacity in the posterior lower lobes, left greater than right. Trace left pleural effusion IMPRESSION: 1. Unremarkable appearances of the right nephrectomy bed. Solitary left kidney is notable only for benign cysts. 2. Small volume perihepatic ascites. 3. Indeterminate 5.5 mm nodule at the posterior periphery of the right lower lobe. Mild atelectatic appearing opacity in the left lower lobe posteriorly. Trace left pleural effusion. Follow-up radiography or chest CT recommended, depending on level of clinical suspicion. Electronically Signed   By: Andreas Newport M.D.   On: 01/02/2016 23:00    Scheduled Meds: . antiseptic oral rinse  7 mL Mouth Rinse q12n4p  . aspirin EC  81 mg Oral BH-q7a  . atenolol  25 mg Oral BID  . chlorhexidine  15 mL Mouth Rinse BID  . clonazePAM  2 mg Oral QHS  . cycloSPORINE  1 drop Both Eyes BID  . doxazosin  2 mg Oral Daily  . finasteride  5 mg Oral QPM  . galantamine  8 mg Oral BID  . guaiFENesin  600 mg Oral BID  . ipratropium  2 spray Each Nare BID  . multivitamin with minerals  1 tablet Oral Daily  . piperacillin-tazobactam (ZOSYN)  IV  3.375 g Intravenous Q8H  . [START ON 01/04/2016] warfarin  2.5 mg Oral Q M,W,F-1800   And  . warfarin  5 mg Oral Q T,Th,S,Su-1800  . Warfarin - Pharmacist Dosing Inpatient   Does not apply q1800    Continuous Infusions:    Time spent: 57mins  Latifah Padin MD, PhD  [facility] Pager [phone]. If 7PM-7AM, please contact night-coverage at [email or web address], password [email or web address] 01/03/2016, 9:15 AM  LOS: 1 day

## 2016-01-04 ENCOUNTER — Other Ambulatory Visit (HOSPITAL_COMMUNITY): Payer: PPO

## 2016-01-04 ENCOUNTER — Inpatient Hospital Stay (HOSPITAL_COMMUNITY): Payer: PPO

## 2016-01-04 DIAGNOSIS — Z950 Presence of cardiac pacemaker: Secondary | ICD-10-CM | POA: Diagnosis not present

## 2016-01-04 DIAGNOSIS — J209 Acute bronchitis, unspecified: Secondary | ICD-10-CM | POA: Diagnosis not present

## 2016-01-04 DIAGNOSIS — N390 Urinary tract infection, site not specified: Secondary | ICD-10-CM | POA: Diagnosis not present

## 2016-01-04 DIAGNOSIS — I509 Heart failure, unspecified: Secondary | ICD-10-CM | POA: Diagnosis not present

## 2016-01-04 DIAGNOSIS — A419 Sepsis, unspecified organism: Secondary | ICD-10-CM | POA: Diagnosis not present

## 2016-01-04 DIAGNOSIS — J189 Pneumonia, unspecified organism: Secondary | ICD-10-CM | POA: Diagnosis not present

## 2016-01-04 DIAGNOSIS — I482 Chronic atrial fibrillation: Secondary | ICD-10-CM | POA: Diagnosis not present

## 2016-01-04 DIAGNOSIS — B952 Enterococcus as the cause of diseases classified elsewhere: Secondary | ICD-10-CM

## 2016-01-04 DIAGNOSIS — G4733 Obstructive sleep apnea (adult) (pediatric): Secondary | ICD-10-CM | POA: Diagnosis not present

## 2016-01-04 DIAGNOSIS — F0390 Unspecified dementia without behavioral disturbance: Secondary | ICD-10-CM

## 2016-01-04 LAB — ECHOCARDIOGRAM COMPLETE
AO mean calculated velocity dopler: 157 cm/s
AOPV: 0.37 m/s
AOVTI: 41.4 cm
AV Area VTI index: 0.72 cm2/m2
AV Area VTI: 1.39 cm2
AV Area mean vel: 1.37 cm2
AV area mean vel ind: 0.71 cm2/m2
AVA: 1.4 cm2
AVCELMEANRAT: 0.36
AVG: 11 mmHg
AVPG: 19 mmHg
AVPKVEL: 217 cm/s
Ao-asc: 34 cm
CHL CUP AV PEAK INDEX: 0.71
CHL CUP AV VEL: 1.4
CHL CUP DOP CALC LVOT VTI: 15.2 cm
CHL CUP MV DEC (S): 158
E decel time: 158 msec
E/e' ratio: 10.16
FS: 26 % — AB (ref 28–44)
HEIGHTINCHES: 70 in
IV/PV OW: 1
LA diam end sys: 53 mm
LADIAMINDEX: 2.72 cm/m2
LASIZE: 53 mm
LAVOL: 194 mL
LAVOLA4C: 183 mL
LAVOLIN: 99.5 mL/m2
LDCA: 3.8 cm2
LV E/e' medial: 10.16
LVEEAVG: 10.16
LVELAT: 12.7 cm/s
LVOT SV: 58 mL
LVOT diameter: 22 mm
LVOT peak VTI: 0.37 cm
LVOTPV: 79.3 cm/s
MVPG: 7 mmHg
MVPKEVEL: 129 m/s
P 1/2 time: 442 ms
PV Reg vel dias: 101 cm/s
PW: 11.7 mm — AB (ref 0.6–1.1)
TAPSE: 16.9 mm
TDI e' lateral: 12.7
TDI e' medial: 10
Valve area index: 0.72
Weight: 2720 oz

## 2016-01-04 LAB — URINE CULTURE: Culture: >=100000 — AB

## 2016-01-04 LAB — COMPREHENSIVE METABOLIC PANEL
ALBUMIN: 2.7 g/dL — AB (ref 3.5–5.0)
ALK PHOS: 76 U/L (ref 38–126)
ALT: 23 U/L (ref 17–63)
AST: 24 U/L (ref 15–41)
Anion gap: 17 — ABNORMAL HIGH (ref 5–15)
BILIRUBIN TOTAL: 0.9 mg/dL (ref 0.3–1.2)
BUN: 31 mg/dL — ABNORMAL HIGH (ref 6–20)
CHLORIDE: 104 mmol/L (ref 101–111)
CO2: 21 mmol/L — AB (ref 22–32)
Calcium: 10 mg/dL (ref 8.9–10.3)
Creatinine, Ser: 1.92 mg/dL — ABNORMAL HIGH (ref 0.61–1.24)
GFR calc Af Amer: 35 mL/min — ABNORMAL LOW (ref >60)
GFR calc non Af Amer: 30 mL/min — ABNORMAL LOW (ref >60)
GLUCOSE: 136 mg/dL — AB (ref 65–99)
POTASSIUM: 4.1 mmol/L (ref 3.5–5.1)
Sodium: 142 mmol/L (ref 135–145)
Total Protein: 5.6 g/dL — ABNORMAL LOW (ref 6.5–8.1)

## 2016-01-04 LAB — CBC
HEMATOCRIT: 34.1 % — AB (ref 39.0–52.0)
HEMOGLOBIN: 10.8 g/dL — AB (ref 13.0–17.0)
MCH: 31.7 pg (ref 26.0–34.0)
MCHC: 31.7 g/dL (ref 30.0–36.0)
MCV: 100 fL (ref 78.0–100.0)
Platelets: 122 10*3/uL — ABNORMAL LOW (ref 150–400)
RBC: 3.41 MIL/uL — AB (ref 4.22–5.81)
RDW: 14.1 % (ref 11.5–15.5)
WBC: 11.6 10*3/uL — AB (ref 4.0–10.5)

## 2016-01-04 LAB — C DIFFICILE QUICK SCREEN W PCR REFLEX
C Diff antigen: NEGATIVE
C Diff interpretation: NEGATIVE
C Diff toxin: NEGATIVE

## 2016-01-04 LAB — MAGNESIUM: Magnesium: 1.9 mg/dL (ref 1.7–2.4)

## 2016-01-04 LAB — PROTIME-INR
INR: 3 — AB (ref 0.00–1.49)
Prothrombin Time: 30.6 seconds — ABNORMAL HIGH (ref 11.6–15.2)

## 2016-01-04 MED ORDER — IPRATROPIUM-ALBUTEROL 0.5-2.5 (3) MG/3ML IN SOLN
3.0000 mL | Freq: Three times a day (TID) | RESPIRATORY_TRACT | Status: DC
Start: 2016-01-05 — End: 2016-01-06
  Administered 2016-01-05 – 2016-01-06 (×5): 3 mL via RESPIRATORY_TRACT
  Filled 2016-01-04 (×5): qty 3

## 2016-01-04 MED ORDER — LOPERAMIDE HCL 2 MG PO CAPS
2.0000 mg | ORAL_CAPSULE | Freq: Four times a day (QID) | ORAL | Status: DC | PRN
  Administered 2016-01-04 – 2016-01-06 (×4): 2 mg via ORAL
  Filled 2016-01-04 (×4): qty 1

## 2016-01-04 MED ORDER — SODIUM CHLORIDE 0.9 % IV SOLN
INTRAVENOUS | Status: AC
  Administered 2016-01-04: 11:00:00 via INTRAVENOUS

## 2016-01-04 MED ORDER — IPRATROPIUM-ALBUTEROL 0.5-2.5 (3) MG/3ML IN SOLN
3.0000 mL | Freq: Three times a day (TID) | RESPIRATORY_TRACT | Status: DC
  Administered 2016-01-04 (×2): 3 mL via RESPIRATORY_TRACT
  Filled 2016-01-04 (×2): qty 3

## 2016-01-04 MED ORDER — WARFARIN SODIUM 1 MG PO TABS
1.0000 mg | ORAL_TABLET | Freq: Once | ORAL | Status: AC
  Administered 2016-01-04: 1 mg via ORAL
  Filled 2016-01-04: qty 1

## 2016-01-04 NOTE — Progress Notes (Signed)
ANTICOAGULATION CONSULT NOTE  Pharmacy Consult for warfarin Indication: atrial fibrillation  No Known Allergies  Patient Measurements: Height: 5\' 10"  (177.8 cm) Weight: 170 lb (77.111 kg) IBW/kg (Calculated) : 73  Vital Signs: Temp: 98.4 F (36.9 C) (07/03 1044) Temp Source: Oral (07/03 1044) BP: 110/47 mmHg (07/03 1044) Pulse Rate: 71 (07/03 1044)  Labs:  Recent Labs  01/02/16 1105 01/02/16 2305 01/03/16 0503 01/04/16 0348  HGB 12.7* 10.8*  --  10.8*  HCT 40.3 33.9*  --  34.1*  PLT 150 122*  --  122*  LABPROT 24.2*  --  27.6* 30.6*  INR 2.20*  --  2.61* 3.00*  CREATININE 1.89* 1.80*  --  1.92*    Estimated Creatinine Clearance: 29.6 mL/min (by C-G formula based on Cr of 1.92).  Assessment: 42 YOM on warfarin for AFib, to be continued here. Home dose if 5mg  daily except 2.5mg  on MWF. INR therapeutic on admission at 2.2. Last dose taken 6/30. Currently on Zosyn.  INR this morning 2.61>3. Hgb 10.2, plts 122- no bleeding noted.  Goal of Therapy:  INR 2-3 Monitor platelets by anticoagulation protocol: Yes   Plan:  -Warfarin 1mg  x 1 dose tonight to keep INR from dropping too low (d/c home dose for now) -daily INR -follow for s/s bleeding  Elicia Lamp, PharmD, BCPS Clinical Pharmacist Pager [phone] 01/04/2016 1:18 PM

## 2016-01-04 NOTE — Progress Notes (Signed)
RT note: Instructed patient with flutter valve use. With good effort patient was able to move secretions.

## 2016-01-04 NOTE — Evaluation (Signed)
Clinical/Bedside Swallow Evaluation Patient Details  Name: Dina Murga Hockett MRN: [id number] Date of Birth: [birth year]  Today's Date: 01/04/2016 Time: SLP Start Time (ACUTE ONLY): [id number] SLP Stop Time (ACUTE ONLY): 1345 SLP Time Calculation (min) (ACUTE ONLY): 23 min  Past Medical History:  Past Medical History  Diagnosis Date  . Allergic rhinitis   . RLS (restless legs syndrome)   . Permanent atrial fibrillation (HCC)     on coumadin  . Esophageal reflux   . Bronchitis   . Normal nuclear stress test 2013  . Transitional cell carcinoma ([place])     s/p nephrectomy in  2010  . Complete heart block ([place])     pacemaker originally placed in 1988  . BPH (benign prostatic hyperplasia)   . Lung nodule     noted on CXR December 2012  . Hypertension   . Dementia   . Pneumonia   . Renal insufficiency     Rt kidney removed d/t maglinant tumor  . Arthritis   . Presence of permanent cardiac pacemaker   . Stroke [facility])     TIA greater than 20 years  . OSA (obstructive sleep apnea)     uses cpap   Past Surgical History:  Past Surgical History  Procedure Laterality Date  . Pacemaker insertion  1988    most recent generator change by Dr Rayann Heman 20067/2/13 with new right ventricular lead placed  . Nephrectomy  2010    rt  . Hernia repair      multiple  . US echocardiography  04/22/2008    EF 55-60%  . Doppler echocardiography  03/26/2001    EF 55%  . Cardiovascular stress test  03/23/2009    EF 65%, NORMAL  . Eye surgery      tumor removed from Rt eye lid  . Mass excision Left 11/07/2012    Procedure: MINOR EXCISION OF MASS;  Surgeon: Haywood Lasso, MD;  Location: [place];  Service: General;  Laterality: Left;  . Permanent pacemaker generator change N/A 01/03/2012    Procedure: PERMANENT PACEMAKER GENERATOR CHANGE;  Surgeon: Thompson Grayer, MD;  Location: [facility] CATH LAB;  Service: Cardiovascular;  Laterality: N/A;  . Cataract extraction w/phaco Left 08/26/2015    Procedure:  CATARACT EXTRACTION PHACO AND INTRAOCULAR LENS PLACEMENT (IOC);  Surgeon: Marylynn Pearson, MD;  Location: [place];  Service: Ophthalmology;  Laterality: Left;   HPI:  82 y.o. male with a PMH of atrial fibrillation and sick sinus syndrome status post pacemaker, GERD (takes meds), TIA 20 yrs ago, OSA on nasal CPAP, and prior history of aspiration pneumonia 3 (10-15 yrs ago) presents with a chief complaint of upper respiratory symptoms. Chest CT patchy ground-glass opacities RIGHT greater than LEFT lungs concerning for pneumonia, alternatively confluent edema, 5 mm RIGHT lower lobe pulmonary nodule stable from August 29, 2014.   Assessment / Plan / Recommendation Clinical Impression  Pt has history of reflux (takes Omeprazole) and had 3 episodes of suspected aspiration pna 10 years ago (daughter confirmed with wife). He has not had pna since. Pt does not demonstrate oral or pharyngeal dysfunction; no s/s aspiration with thin liquid or solid. Do not suspect aspiration during the swallow. During assessment he denied globus sensation, no eructation. It is possible he may have aspirated reflux at home leading to Music pna. Thoroughly educated pt on reflux precautions, discussed and provided written handout. Recommend continue regular texture and thin liquids, stay upright 1 hour after meals, alternate liquids and solids, use caution  with peppermint, wedge if needed (doesn't like to use his), slow consumption. No follow up needed at this time. If pt returns with repeated pna, MBS may be indicated at that time.       Aspiration Risk  Moderate aspiration risk    Diet Recommendation Regular;Thin liquid   Liquid Administration via: Cup Medication Administration: Whole meds with liquid Supervision: Patient able to self feed Compensations: Small sips/bites;Slow rate;Follow solids with liquid Postural Changes: Remain upright for at least 30 minutes after po intake;Seated upright at 90 degrees    Other   Recommendations Oral Care Recommendations: Oral care BID   Follow up Recommendations  None    Frequency and Duration            Prognosis        Swallow Study   General HPI: 82 y.o. male with a PMH of atrial fibrillation and sick sinus syndrome status post pacemaker, GERD (takes meds), TIA 20 yrs ago, OSA on nasal CPAP, and prior history of aspiration pneumonia 3 (10-15 yrs ago) presents with a chief complaint of upper respiratory symptoms. Chest CT patchy ground-glass opacities RIGHT greater than LEFT lungs concerning for pneumonia, alternatively confluent edema, 5 mm RIGHT lower lobe pulmonary nodule stable from August 29, 2014. Type of Study: Bedside Swallow Evaluation Previous Swallow Assessment:  (MBS 2001 results unavailable) Diet Prior to this Study: Regular;Thin liquids Temperature Spikes Noted: No Respiratory Status: Room air History of Recent Intubation: No Behavior/Cognition: Alert;Cooperative;Pleasant mood Oral Cavity Assessment: Within Functional Limits Oral Care Completed by SLP: No Oral Cavity - Dentition: Adequate natural dentition Vision: Functional for self-feeding Self-Feeding Abilities: Able to feed self Patient Positioning: Upright in chair Baseline Vocal Quality: Normal Volitional Cough: Strong Volitional Swallow: Able to elicit    Oral/Motor/Sensory Function Overall Oral Motor/Sensory Function: Within functional limits   Ice Chips Ice chips: Not tested   Thin Liquid Thin Liquid: Within functional limits Presentation: Cup;Straw    Nectar Thick Nectar Thick Liquid: Not tested   Honey Thick Honey Thick Liquid: Not tested   Puree Puree: Not tested   Solid   GO   Solid: Within functional limits        Houston Siren 01/04/2016,2:27 PM  Orbie Pyo Colvin Caroli.Ed [employer] [phone]

## 2016-01-04 NOTE — Care Management Important Message (Signed)
Important Message  Patient Details  Name: [name] MRN: [id number] Date of Birth: 22-Jul-1929   Medicare Important Message Given:  Yes    Talen Poser Abena 01/04/2016, 11:29 AM

## 2016-01-04 NOTE — Evaluation (Signed)
Physical Therapy Evaluation Patient Details Name: [name] MRN: [id number] DOB: 12/03/1929 Today's Date: 01/04/2016   History of Present Illness  82 y.o. male admitted with sepsis and now diagnosed with pneumonia. PMH: atrial fibrillation and sick sinus syndrome status post pacemaker, history of aspiration pneumonia 3, hypertension, dementia, stroke.  Clinical Impression  Pt presenting with mild instability with ambulation but no gross loss of balance. Anticipate pt will D/C to home when medically stable. Will continue PT services to continue to work on mobility and safety as pt will need to be supervision to modified independent when he returns home. Pt states that his wife will be around but she will not be able to provide any physical assistance.     Follow Up Recommendations No PT follow up;Supervision for mobility/OOB    Equipment Recommendations  None recommended by PT    Recommendations for Other Services       Precautions / Restrictions Precautions Precautions: Fall Precaution Comments: pacemaker Restrictions Weight Bearing Restrictions: No      Mobility  Bed Mobility               General bed mobility comments: pt up with nursing upon arrival  Transfers Overall transfer level: Needs assistance Equipment used: None Transfers: Sit to/from Stand Sit to Stand: Supervision         General transfer comment: supervision for safety  Ambulation/Gait Ambulation/Gait assistance: Min guard Ambulation Distance (Feet): 160 Feet Assistive device: None Gait Pattern/deviations: Step-through pattern;Trunk flexed Gait velocity: decreased   General Gait Details: limited stride lenght bilaterally with mild instability but no gross loss of balance.   Stairs            Wheelchair Mobility    Modified Rankin (Stroke Patients Only)       Balance Overall balance assessment: Needs assistance Sitting-balance support: No upper extremity supported Sitting  balance-Leahy Scale: Normal     Standing balance support: No upper extremity supported Standing balance-Leahy Scale: Fair                               Pertinent Vitals/Pain Pain Assessment: No/denies pain    Home Living Family/patient expects to be discharged to:: Private residence Living Arrangements: Spouse/significant other Available Help at Discharge: Family;Available 24 hours/day Type of Home: House Home Access: Level entry     Home Layout: One level Home Equipment: None Additional Comments: pt states that his spouse will not be able to provide any physical assistance but is around all the time.     Prior Function Level of Independence: Independent         Comments: reports driving and doing zumba 3xwk.      Hand Dominance        Extremity/Trunk Assessment   Upper Extremity Assessment: Overall WFL for tasks assessed           Lower Extremity Assessment: Overall WFL for tasks assessed         Communication   Communication: No difficulties  Cognition Arousal/Alertness: Awake/alert Behavior During Therapy: WFL for tasks assessed/performed Overall Cognitive Status: No family/caregiver present to determine baseline cognitive functioning (history of dementia, oriented to place and situation. )           Safety/Judgement: Decreased awareness of safety          General Comments General comments (skin integrity, edema, etc.): Pt able to stand briefly on single leg when attempting to don his  pants. Poor safety awareness noted as attempting to don in standing on single leg despite cues for sitting.     Exercises        Assessment/Plan    PT Assessment Patient needs continued PT services  PT Diagnosis Difficulty walking   PT Problem List Decreased strength;Decreased range of motion;Decreased balance;Decreased activity tolerance;Decreased mobility  PT Treatment Interventions DME instruction;Gait training;Functional mobility  training;Therapeutic activities;Therapeutic exercise;Patient/family education   PT Goals ([place] goals can be found in the Care Plan section) Acute Rehab PT Goals Patient Stated Goal: get back home PT Goal Formulation: With patient Time For Goal Achievement: 01/18/16 Potential to Achieve Goals: Good    Frequency Min 3X/week   Barriers to discharge Decreased caregiver support      Co-evaluation               End of Session Equipment Utilized During Treatment: Gait belt Activity Tolerance: Patient tolerated treatment well Patient left: in chair;with call bell/phone within reach;with chair alarm set;with nursing/sitter in room Nurse Communication: Mobility status         Time: 1030-1047 PT Time Calculation (min) (ACUTE ONLY): 17 min   Charges:   PT Evaluation $PT Eval Moderate Complexity: 1 Procedure     PT G Codes:        Cassell Clement, PT, CSCS Pager [phone] Office [phone]  01/04/2016, 11:03 AM

## 2016-01-04 NOTE — Progress Notes (Addendum)
PROGRESS NOTE  [name] [id number] DOB: 1929/08/07 DOA: 01/02/2016 PCP: Irven Shelling, MD  HPI/Recap of past 24 hours:  tmax 100 last 24hrs, on room air, congested cough, but nonproductive,   Daughter stephanie in room  Assessment/Plan: Principal Problem:   Sepsis ([place]) Active Problems:   Obstructive sleep apnea   FIBRILLATION, ATRIAL   Esophageal reflux   SIRS (systemic inflammatory response syndrome) (HCC)    Sepsis with fever of 104.4, RR 22, leukocytosis wbc>12, lactic acidosis, cr elevation, source of infection from bronchitis/pna/uti, patient does has significant respiratory symptom, but denies urinary symptom.  ivf/ abx, better.   PNA/bronchitis: community acquired vs aspiration pna. Initial cxr ? Developing pna, CT renal did reveal left low lobe opacity and possible small pleural effusion.  He does has congested cough, on exam with significant rhonchi, more on the left,  CT chest + bronchial wall thickening, ? Pna vs edema, clinically patient is dry, ct findings likely consistent with infection, continue zosyn, mucinex, add nebs. Swallow eval unremarkable. Echo pending  UTI?: he does has h/o bph, he denies significant urinary symptom, urine culture enterococcus pan sensitive. Already on abx.  CKDIII, cr 189-1.80-1.92, cr in 08/2015 1.48, now sure if new baseline or component of acute kidney injury, gentle hydration, renal dosing meds, repeat bmp in am  Diarrhea? No ab pain, does have Abdominal distension, negative c diff. Possible some mild ileus that is better.   Afib, with h/o cva, h/o heart block s/p pacemaker, currently paced rhythm, on  Atenolol, warfarin. inr therapeutic, pharmacy to dose coumadin.  osa on cpap  Mild dementia: baseline independent, still drives per family.     Code Status: full  Family Communication: patient  And  stephanie who is a NP in room  Disposition Plan: home when medically stable in 1-2  days   Consultants:  none  Procedures:  none  Antibiotics:  zosyn   Objective: BP 123/72 mmHg  Pulse 71  Temp(Src) 97.7 F (36.5 C) (Oral)  Resp 18  Ht 5\' 10"  (1.778 m)  Wt 77.111 kg (170 lb)  BMI 24.39 kg/m2  SpO2 95%  Intake/Output Summary (Last 24 hours) at 01/04/16 0831 Last data filed at 01/04/16 [id number]  Gross per 24 hour  Intake    990 ml  Output    100 ml  Net    890 ml   Filed Weights   01/02/16 1045  Weight: 77.111 kg (170 lb)    Exam:   General:  NAD  Cardiovascular: paced rhythm   Respiratory: coarse, significant rhonchi , more on left side, after deep cough , exam has improved.  Abdomen: Soft/ND/NT, positive BS  Musculoskeletal: trace pitting edema on left leg ( per wife this is at baseline)  Neuro: aaox3, daughter states patient is a little confused more than usual.   Data Reviewed: Basic Metabolic Panel:  Recent Labs Lab 01/02/16 1105 01/02/16 2305 01/04/16 0348  NA 139 137 142  K 4.2 4.0 4.1  CL 109 112* 104  CO2 25 21* 21*  GLUCOSE 136* 136* 136*  BUN 23* 27* 31*  CREATININE 1.89* 1.80* 1.92*  CALCIUM 9.4 8.1* 10.0  MG  --   --  1.9   Liver Function Tests:  Recent Labs Lab 01/02/16 1105 01/04/16 0348  AST 22 24  ALT 18 23  ALKPHOS 93 76  BILITOT 1.8* 0.9  PROT 6.8 5.6*  ALBUMIN 3.5 2.7*   No results for input(s): LIPASE, AMYLASE in the last 168 hours.  No results for input(s): AMMONIA in the last 168 hours. CBC:  Recent Labs Lab 01/02/16 1105 01/02/16 2305 01/04/16 0348  WBC 12.1* 12.0* 11.6*  NEUTROABS 11.2*  --   --   HGB 12.7* 10.8* 10.8*  HCT 40.3 33.9* 34.1*  MCV 100.8* 102.4* 100.0  PLT 150 122* 122*   Cardiac Enzymes:   No results for input(s): CKTOTAL, CKMB, CKMBINDEX, TROPONINI in the last 168 hours. BNP (last 3 results) No results for input(s): BNP in the last 8760 hours.  ProBNP (last 3 results) No results for input(s): PROBNP in the last 8760 hours.  CBG: No results for input(s):  GLUCAP in the last 168 hours.  Recent Results (from the past 240 hour(s))  Blood Culture (routine x 2)     Status: None (Preliminary result)   Collection Time: 01/02/16 11:05 AM  Result Value Ref Range Status   Specimen Description BLOOD RIGHT ANTECUBITAL  Final   Special Requests BOTTLES DRAWN AEROBIC AND ANAEROBIC 5CC  Final   Culture NO GROWTH 1 DAY  Final   Report Status PENDING  Incomplete  Blood Culture (routine x 2)     Status: None (Preliminary result)   Collection Time: 01/02/16 11:15 AM  Result Value Ref Range Status   Specimen Description BLOOD LEFT FOREARM  Final   Special Requests BOTTLES DRAWN AEROBIC AND ANAEROBIC 5CC  Final   Culture NO GROWTH 1 DAY  Final   Report Status PENDING  Incomplete  Urine culture     Status: Abnormal (Preliminary result)   Collection Time: 01/02/16 11:28 AM  Result Value Ref Range Status   Specimen Description URINE, CLEAN CATCH  Final   Special Requests NONE  Final   Culture >=100,000 COLONIES/mL ENTEROCOCCUS SPECIES (A)  Final   Report Status PENDING  Incomplete  Respiratory Panel by PCR     Status: None   Collection Time: 01/02/16  4:52 PM  Result Value Ref Range Status   Adenovirus NOT DETECTED NOT DETECTED Final   Coronavirus 229E NOT DETECTED NOT DETECTED Final   Coronavirus HKU1 NOT DETECTED NOT DETECTED Final   Coronavirus NL63 NOT DETECTED NOT DETECTED Final   Coronavirus OC43 NOT DETECTED NOT DETECTED Final   Metapneumovirus NOT DETECTED NOT DETECTED Final   Rhinovirus / Enterovirus NOT DETECTED NOT DETECTED Final   Influenza A NOT DETECTED NOT DETECTED Final   Influenza A H1 NOT DETECTED NOT DETECTED Final   Influenza A H1 2009 NOT DETECTED NOT DETECTED Final   Influenza A H3 NOT DETECTED NOT DETECTED Final   Influenza B NOT DETECTED NOT DETECTED Final   Parainfluenza Virus 1 NOT DETECTED NOT DETECTED Final   Parainfluenza Virus 2 NOT DETECTED NOT DETECTED Final   Parainfluenza Virus 3 NOT DETECTED NOT DETECTED Final    Parainfluenza Virus 4 NOT DETECTED NOT DETECTED Final   Respiratory Syncytial Virus NOT DETECTED NOT DETECTED Final   Bordetella pertussis NOT DETECTED NOT DETECTED Final   Chlamydophila pneumoniae NOT DETECTED NOT DETECTED Final   Mycoplasma pneumoniae NOT DETECTED NOT DETECTED Final  MRSA PCR Screening     Status: None   Collection Time: 01/03/16  7:02 PM  Result Value Ref Range Status   MRSA by PCR NEGATIVE NEGATIVE Final    Comment:        The GeneXpert MRSA Assay (FDA approved for NASAL specimens only), is one component of a comprehensive MRSA colonization surveillance program. It is not intended to diagnose MRSA infection nor to guide or monitor treatment  for MRSA infections.   C difficile quick scan w PCR reflex     Status: None   Collection Time: 01/03/16  8:45 PM  Result Value Ref Range Status   C Diff antigen NEGATIVE NEGATIVE Final   C Diff toxin NEGATIVE NEGATIVE Final   C Diff interpretation Negative for toxigenic C. difficile  Final     Studies: Ct Chest Wo Contrast  01/03/2016  CLINICAL DATA:  Follow-up recurrent pneumonia. History of esophageal reflux, bronchitis, dementia, pneumonia. EXAM: CT CHEST WITHOUT CONTRAST TECHNIQUE: Multidetector CT imaging of the chest was performed following the standard protocol without IV contrast. COMPARISON:  Chest radiograph January 02, 2016 and CT chest August 29, 2014 and CT abdomen and pelvis January 02, 2016 FINDINGS: Cardiovascular: Similar least moderate cardiomegaly with small chronic pericardial effusion. Mild coronary artery calcifications in place. RIGHT cardiac pacemaker in situ. Thoracic aorta is normal in course and caliber with moderate calcific atherosclerosis. Mediastinum/Nodes: No definite lymphadenopathy though limited assessment by noncontrast CT. Similar subcentimeter precarinal lymph node. Lungs/Pleura: Small bilateral pleural effusions and dependent atelectasis. Superimposed patchy ground-glass opacities RIGHT upper lobe,  posterior segment and, RIGHT middle lobe with tiny centrilobular ground-glass nodules in RIGHT lung and to lesser extent LEFT lower lobe. Bronchial wall thickening tracheobronchial tree is patent and midline. 5 mm RIGHT lower lobe pulmonary nodule, image 92/142 which appears to represent prior 5 mm pulmonary nodule. 2 mm RIGHT normal lung pulmonary nodule. 2 mm subpleural RIGHT upper lobe pulmonary nodules likely benign. 3 mm calcified granuloma LEFT upper lobe. 7 mm sub solid pulmonary nodule RIGHT upper lobe abutting the fissure. Upper Abdomen: Calcified splenic granulomas. 2.7 cm LEFT upper pole renal cyst partially imaged. Small amount of ascites in the included RIGHT upper quadrant with linear air density anterior to the liver corresponding to known colonic intra positioning. Musculoskeletal: RIGHT transversus abdominus small lipoma. Mild dependent subcutaneous edema. IMPRESSION: Similar cardiomegaly. Small pleural effusions and bronchial wall thickening, which can be seen with pulmonary edema or bronchitis. Patchy ground-glass opacities RIGHT greater than LEFT lungs concerning for pneumonia, alternatively confluent edema. 5 mm RIGHT lower lobe pulmonary nodule stable from August 29, 2014. 8 mm RIGHT upper lobe sub solid pulmonary nodule. Follow-up by chest CT without contrast is recommended in 3 months to confirm persistence. This recommendation follows the consensus statement: Recommendations for the Management of Subsolid Pulmonary Nodules Detected at CT: A Statement from the [place] as published in Radiology 2013; 266:304-317. Electronically Signed   By: Elon Alas M.D.   On: 01/03/2016 19:18    Scheduled Meds: . antiseptic oral rinse  7 mL Mouth Rinse q12n4p  . aspirin EC  81 mg Oral BH-q7a  . atenolol  25 mg Oral BID  . chlorhexidine  15 mL Mouth Rinse BID  . clonazePAM  2 mg Oral QHS  . cycloSPORINE  1 drop Both Eyes BID  . doxazosin  2 mg Oral Daily  . finasteride  5 mg  Oral QPM  . galantamine  8 mg Oral BID  . guaiFENesin  600 mg Oral BID  . ipratropium  2 spray Each Nare BID  . ipratropium-albuterol  3 mL Nebulization Q8H  . multivitamin with minerals  1 tablet Oral Daily  . piperacillin-tazobactam (ZOSYN)  IV  3.375 g Intravenous Q8H  . warfarin  2.5 mg Oral Q M,W,F-1800   And  . warfarin  5 mg Oral Q T,Th,S,Su-1800  . Warfarin - Pharmacist Dosing Inpatient   Does not apply [phone]  Continuous Infusions:    Time spent: 3mins  Jonea Bukowski MD, PhD  [facility] Pager [phone]. If 7PM-7AM, please contact night-coverage at [email or web address], password [facility] 01/04/2016, 8:31 AM  LOS: 2 days

## 2016-01-04 NOTE — Progress Notes (Signed)
Echocardiogram 2D Echocardiogram has been performed.  Aggie Cosier 01/04/2016, 4:30 PM

## 2016-01-05 DIAGNOSIS — A419 Sepsis, unspecified organism: Secondary | ICD-10-CM | POA: Diagnosis not present

## 2016-01-05 DIAGNOSIS — Z950 Presence of cardiac pacemaker: Secondary | ICD-10-CM | POA: Diagnosis not present

## 2016-01-05 DIAGNOSIS — G4733 Obstructive sleep apnea (adult) (pediatric): Secondary | ICD-10-CM | POA: Diagnosis not present

## 2016-01-05 DIAGNOSIS — N390 Urinary tract infection, site not specified: Secondary | ICD-10-CM | POA: Diagnosis not present

## 2016-01-05 DIAGNOSIS — N183 Chronic kidney disease, stage 3 (moderate): Secondary | ICD-10-CM

## 2016-01-05 DIAGNOSIS — J189 Pneumonia, unspecified organism: Secondary | ICD-10-CM | POA: Diagnosis not present

## 2016-01-05 DIAGNOSIS — J209 Acute bronchitis, unspecified: Secondary | ICD-10-CM

## 2016-01-05 DIAGNOSIS — I482 Chronic atrial fibrillation: Secondary | ICD-10-CM | POA: Diagnosis not present

## 2016-01-05 LAB — BASIC METABOLIC PANEL
ANION GAP: 6 (ref 5–15)
BUN: 32 mg/dL — ABNORMAL HIGH (ref 6–20)
CHLORIDE: 114 mmol/L — AB (ref 101–111)
CO2: 20 mmol/L — AB (ref 22–32)
Calcium: 8.8 mg/dL — ABNORMAL LOW (ref 8.9–10.3)
Creatinine, Ser: 1.83 mg/dL — ABNORMAL HIGH (ref 0.61–1.24)
GFR calc Af Amer: 37 mL/min — ABNORMAL LOW (ref >60)
GFR, EST NON AFRICAN AMERICAN: 32 mL/min — AB (ref >60)
GLUCOSE: 126 mg/dL — AB (ref 65–99)
POTASSIUM: 4.1 mmol/L (ref 3.5–5.1)
Sodium: 140 mmol/L (ref 135–145)

## 2016-01-05 LAB — CBC
HEMATOCRIT: 33.2 % — AB (ref 39.0–52.0)
HEMOGLOBIN: 10.5 g/dL — AB (ref 13.0–17.0)
MCH: 31.5 pg (ref 26.0–34.0)
MCHC: 31.6 g/dL (ref 30.0–36.0)
MCV: 99.7 fL (ref 78.0–100.0)
Platelets: 134 10*3/uL — ABNORMAL LOW (ref 150–400)
RBC: 3.33 MIL/uL — AB (ref 4.22–5.81)
RDW: 14.1 % (ref 11.5–15.5)
WBC: 10.9 10*3/uL — AB (ref 4.0–10.5)

## 2016-01-05 LAB — PROTIME-INR
INR: 3.79 — ABNORMAL HIGH (ref 0.00–1.49)
Prothrombin Time: 36.5 seconds — ABNORMAL HIGH (ref 11.6–15.2)

## 2016-01-05 LAB — HEMOGLOBIN A1C
HEMOGLOBIN A1C: 5.7 % — AB (ref 4.8–5.6)
MEAN PLASMA GLUCOSE: 117 mg/dL

## 2016-01-05 MED ORDER — GUAIFENESIN ER 600 MG PO TB12: 600.0000 mg | ORAL_TABLET | Freq: Two times a day (BID) | ORAL | Status: DC

## 2016-01-05 NOTE — Progress Notes (Addendum)
ANTICOAGULATION/ANTIBIOTIC CONSULT NOTE  Pharmacy Consult for warfarin (and Zosyn) Indication: atrial fibrillation  (PNA/possible UTI/Sepsis)  No Known Allergies  Patient Measurements: Height: 5\' 10"  (177.8 cm) Weight: 170 lb (77.111 kg) IBW/kg (Calculated) : 73  Vital Signs: Temp: 99.6 F (37.6 C) (07/04 0432) Temp Source: Oral (07/04 0432) BP: 117/62 mmHg (07/04 0432) Pulse Rate: 69 (07/04 0432)  Labs:  Recent Labs  01/02/16 2305 01/03/16 0503 01/04/16 0348 01/05/16 0258  HGB 10.8*  --  10.8* 10.5*  HCT 33.9*  --  34.1* 33.2*  PLT 122*  --  122* 134*  LABPROT  --  27.6* 30.6* 36.5*  INR  --  2.61* 3.00* 3.79*  CREATININE 1.80*  --  1.92* 1.83*    Estimated Creatinine Clearance: 31 mL/min (by C-G formula based on Cr of 1.83).  Assessment: 54 YOM on warfarin for AFib that presented with sepsis on 01/02/16.   Anticoag: Warfarin for Afib Home dose if 5mg  daily except 2.5mg  on MWF. INR therapeutic on admission at 2.2. Last dose taken 6/30.  INR Currently 2.6>3>3.79 Hgb 10.5, Plts 134, no bleeding noted.   ID:  PNA- started on CAP tx, but changed to Zosyn for aspiration coverage given patient's history. LA 3>1>1.5 WBC 11.6>10.9, AF (tmax 100 F). Pt denies urinary symptoms.   Azith 7/1 x1 CTX 7/1 x1 Zosyn 7/1>>  7/1 urine: >100K enterococcus (pan-S) 7/1 Bcx: ngtd 7/2 MRSA PCR neg  7/1 resp virus panel: negative 7/2 cdiff: neg 7/2 strep pneumo: neg   Goal of Therapy:  INR 2-3 Monitor platelets by anticoagulation protocol: Yes   Plan:  -Hold Warfarin x 1 tonight -daily INR -follow for s/s bleeding  -Zosyn 3.375g IV q8h EI - no urinary sxs, could switch to levo if wanted to tx UC -follow c/s, renal function, de-escalation    Bennye Alm, PharmD Pharmacy Resident [phone]  01/05/2016 7:51 AM

## 2016-01-05 NOTE — Discharge Instructions (Signed)

## 2016-01-05 NOTE — Progress Notes (Addendum)
PROGRESS NOTE  [name] [id number] DOB: 1929-08-26 DOA: 01/02/2016 PCP: Irven Shelling, MD  Brief Summary:  [name] is an 82 y.o. male with a PMH of atrial fibrillation and sick sinus syndrome status post pacemaker, OSA on nasal CPAP, and prior history of aspiration pneumonia 3 presents with a chief complaint of upper respiratory symptoms. The patient's symptoms initially began 3 days ago with sore throat and cough. This was followed by progressive weakness and loss of energy. The patient saw his PCP on 01/01/16 where a rapid strep test was performed and was negative. His blood pressure was noted to be low at that time. This morning, the patient developed fever up to 103.5 at home with ongoing dry cough and chest congestion. According to family members, he was confused more than usual and was subsequently brought to the hospital via EMS.  ED Course: Code sepsis called in the ED. Chest x-ray negative for acute abnormalities. Urinalysis negative for nitrites but did show leukocytes and many bacteria. Specimen was contaminated with squamous cells. WBC 12.1. Lactic acid WNL. Given a dose of Rocephin and azithromycin as well as fluid volume resuscitation.    HPI/Recap of past 24 hours:  tmax 100 last 24hrs, on room air, congested cough, but nonproductive,  Patient has baseline dementia, not able to remember whether he received nebulizer treatment or not. Family not  in room  Assessment/Plan: Principal Problem:   Sepsis ([place]) Active Problems:   Obstructive sleep apnea   FIBRILLATION, ATRIAL   Esophageal reflux   SIRS (systemic inflammatory response syndrome) ([place])    Sepsis presented on admission with fever of 104.4, RR 22, leukocytosis wbc>12, lactic acidosis, cr elevation, source of infection from bronchitis/pna/uti, patient does has significant respiratory symptom, but denies urinary symptom.  ivf/ abx, better.   PNA/bronchitis: community acquired vs aspiration  pna. Initial cxr ? Developing pna, CT renal did reveal left low lobe opacity and possible small pleural effusion.  He does has congested cough, on exam with significant rhonchi, more on the left,  CT chest + bronchial wall thickening, ? Pna vs edema, clinically patient is dry, ct findings likely consistent with infection, continue zosyn, mucinex, add nebs. strepneumo antigen negative, legionella antigen in process, Swallow eval unremarkable.   15mm Right lung nodule on CT chest, need to repeat CT chest in three months, family made aware.   UTI?: he does has h/o bph, he denies significant urinary symptom, urine culture enterococcus pan sensitive. Already on abx.  CKDIII, h/o nephrectomy, no solitary kidney, cr 189-1.80-1.92, cr in 08/2015 1.48, now sure if new baseline or component of acute kidney injury,  CT renal no obstruction, cr did not change after gentle hydration, renal dosing meds, repeat bmp in am, family aware to repeat bmp at hospital follow up to monitor cr.  Diarrhea?   Patient was prescribed cipro/flagyl for diarrhea prior to coming to the hospital, but he has not taken it. No ab pain, does have Abdominal distension initially on exam, negative c diff. Possible some mild ileus that is better now.   H/o Afib, with h/o cva, h/o heart block s/p pacemaker, currently paced rhythm, on  Atenolol, warfarin.  pharmacy to dose coumadin. INR 3.79 on 7/4, hold coumadin today, repeat level in am. Family report difficulty to go to coumadin clinic for INR check  Due to patient has dementia and wife has MS,care manager consulted for possible set up home health RN for INR check.  Cardiomegaly: EchocardiogramNormal LV systolic function;  mild LVH; biatrial enlargement; calcified aortic valve with mild AS and trace AI; mild MR; mild  TR; moderately elevated pulmonary pressure. Echodensity in  posterior pericardial space of uncertain etiology; suggest  cardiac MRI to further assess. Defer  outpatient  cardiology to determine cardiac MRI  osa on cpap  Mild dementia: baseline independent, still drives per family. Advise patient to stop driving.     Code Status: full  Family Communication: patient  And  Daughter stephanie who is going to be a NP over the phone  Disposition Plan: home when medically stable in 1-2 days, pending respiratory symptom and INR level   Consultants:  none  Procedures:  none  Antibiotics:  zosyn   Objective: BP 117/62 mmHg  Pulse 69  Temp(Src) 99.6 F (37.6 C) (Oral)  Resp 16  Ht 5\' 10"  (1.778 m)  Wt 77.111 kg (170 lb)  BMI 24.39 kg/m2  SpO2 94%  Intake/Output Summary (Last 24 hours) at 01/05/16 0810 Last data filed at 01/05/16 0547  Gross per 24 hour  Intake    100 ml  Output    253 ml  Net   -153 ml   Filed Weights   01/02/16 1045  Weight: 77.111 kg (170 lb)    Exam:   General:  NAD  Cardiovascular: paced rhythm   Respiratory: coarse, significant rhonchi , more on left side, after deep cough , exam has improved. Does has upper airway component.   Abdomen: Soft/ND/NT, positive BS  Musculoskeletal: trace pitting edema on left leg ( per wife this is at baseline)  Neuro: aaox3, daughter states patient is a little confused more than usual. Poor short term memory.  Data Reviewed: Basic Metabolic Panel:  Recent Labs Lab 01/02/16 1105 01/02/16 2305 01/04/16 0348 01/05/16 0258  NA 139 137 142 140  K 4.2 4.0 4.1 4.1  CL 109 112* 104 114*  CO2 25 21* 21* 20*  GLUCOSE 136* 136* 136* 126*  BUN 23* 27* 31* 32*  CREATININE 1.89* 1.80* 1.92* 1.83*  CALCIUM 9.4 8.1* 10.0 8.8*  MG  --   --  1.9  --    Liver Function Tests:  Recent Labs Lab 01/02/16 1105 01/04/16 0348  AST 22 24  ALT 18 23  ALKPHOS 93 76  BILITOT 1.8* 0.9  PROT 6.8 5.6*  ALBUMIN 3.5 2.7*   No results for input(s): LIPASE, AMYLASE in the last 168 hours. No results for input(s): AMMONIA in the last 168 hours. CBC:  Recent Labs Lab  01/02/16 1105 01/02/16 2305 01/04/16 0348 01/05/16 0258  WBC 12.1* 12.0* 11.6* 10.9*  NEUTROABS 11.2*  --   --   --   HGB 12.7* 10.8* 10.8* 10.5*  HCT 40.3 33.9* 34.1* 33.2*  MCV 100.8* 102.4* 100.0 99.7  PLT 150 122* 122* 134*   Cardiac Enzymes:   No results for input(s): CKTOTAL, CKMB, CKMBINDEX, TROPONINI in the last 168 hours. BNP (last 3 results) No results for input(s): BNP in the last 8760 hours.  ProBNP (last 3 results) No results for input(s): PROBNP in the last 8760 hours.  CBG: No results for input(s): GLUCAP in the last 168 hours.  Recent Results (from the past 240 hour(s))  Blood Culture (routine x 2)     Status: None (Preliminary result)   Collection Time: 01/02/16 11:05 AM  Result Value Ref Range Status   Specimen Description BLOOD RIGHT ANTECUBITAL  Final   Special Requests BOTTLES DRAWN AEROBIC AND ANAEROBIC 5CC  Final   Culture NO  GROWTH 2 DAYS  Final   Report Status PENDING  Incomplete  Blood Culture (routine x 2)     Status: None (Preliminary result)   Collection Time: 01/02/16 11:15 AM  Result Value Ref Range Status   Specimen Description BLOOD LEFT FOREARM  Final   Special Requests BOTTLES DRAWN AEROBIC AND ANAEROBIC 5CC  Final   Culture NO GROWTH 2 DAYS  Final   Report Status PENDING  Incomplete  Urine culture     Status: Abnormal   Collection Time: 01/02/16 11:28 AM  Result Value Ref Range Status   Specimen Description URINE, CLEAN CATCH  Final   Special Requests NONE  Final   Culture >=100,000 COLONIES/mL ENTEROCOCCUS SPECIES (A)  Final   Report Status 01/04/2016 FINAL  Final   Organism ID, Bacteria ENTEROCOCCUS SPECIES (A)  Final      Susceptibility   Enterococcus species - MIC*    AMPICILLIN <=2 SENSITIVE Sensitive     LEVOFLOXACIN 1 SENSITIVE Sensitive     NITROFURANTOIN <=16 SENSITIVE Sensitive     VANCOMYCIN 1 SENSITIVE Sensitive     * >=100,000 COLONIES/mL ENTEROCOCCUS SPECIES  Respiratory Panel by PCR     Status: None   Collection  Time: 01/02/16  4:52 PM  Result Value Ref Range Status   Adenovirus NOT DETECTED NOT DETECTED Final   Coronavirus 229E NOT DETECTED NOT DETECTED Final   Coronavirus HKU1 NOT DETECTED NOT DETECTED Final   Coronavirus NL63 NOT DETECTED NOT DETECTED Final   Coronavirus OC43 NOT DETECTED NOT DETECTED Final   Metapneumovirus NOT DETECTED NOT DETECTED Final   Rhinovirus / Enterovirus NOT DETECTED NOT DETECTED Final   Influenza A NOT DETECTED NOT DETECTED Final   Influenza A H1 NOT DETECTED NOT DETECTED Final   Influenza A H1 2009 NOT DETECTED NOT DETECTED Final   Influenza A H3 NOT DETECTED NOT DETECTED Final   Influenza B NOT DETECTED NOT DETECTED Final   Parainfluenza Virus 1 NOT DETECTED NOT DETECTED Final   Parainfluenza Virus 2 NOT DETECTED NOT DETECTED Final   Parainfluenza Virus 3 NOT DETECTED NOT DETECTED Final   Parainfluenza Virus 4 NOT DETECTED NOT DETECTED Final   Respiratory Syncytial Virus NOT DETECTED NOT DETECTED Final   Bordetella pertussis NOT DETECTED NOT DETECTED Final   Chlamydophila pneumoniae NOT DETECTED NOT DETECTED Final   Mycoplasma pneumoniae NOT DETECTED NOT DETECTED Final  MRSA PCR Screening     Status: None   Collection Time: 01/03/16  7:02 PM  Result Value Ref Range Status   MRSA by PCR NEGATIVE NEGATIVE Final    Comment:        The GeneXpert MRSA Assay (FDA approved for NASAL specimens only), is one component of a comprehensive MRSA colonization surveillance program. It is not intended to diagnose MRSA infection nor to guide or monitor treatment for MRSA infections.   C difficile quick scan w PCR reflex     Status: None   Collection Time: 01/03/16  8:45 PM  Result Value Ref Range Status   C Diff antigen NEGATIVE NEGATIVE Final   C Diff toxin NEGATIVE NEGATIVE Final   C Diff interpretation Negative for toxigenic C. difficile  Final     Studies: No results found.  Scheduled Meds: . antiseptic oral rinse  7 mL Mouth Rinse q12n4p  . aspirin EC   81 mg Oral BH-q7a  . atenolol  25 mg Oral BID  . chlorhexidine  15 mL Mouth Rinse BID  . clonazePAM  2 mg Oral  QHS  . cycloSPORINE  1 drop Both Eyes BID  . doxazosin  2 mg Oral Daily  . finasteride  5 mg Oral QPM  . galantamine  8 mg Oral BID  . guaiFENesin  600 mg Oral BID  . ipratropium  2 spray Each Nare BID  . ipratropium-albuterol  3 mL Nebulization TID  . multivitamin with minerals  1 tablet Oral Daily  . piperacillin-tazobactam (ZOSYN)  IV  3.375 g Intravenous Q8H  . Warfarin - Pharmacist Dosing Inpatient   Does not apply q1800    Continuous Infusions: . sodium chloride 75 mL/hr at 01/04/16 1048     Time spent: 63mins  Ashan Cueva MD, PhD  [facility] Pager [phone]. If 7PM-7AM, please contact night-coverage at [email or web address], password [facility] 01/05/2016, 8:10 AM  LOS: 3 days

## 2016-01-05 NOTE — Progress Notes (Signed)
Physical Therapy Treatment Patient Details Name: [name] MRN: [id number] DOB: [birth year] Today's Date: 01/05/2016    History of Present Illness 82 y.o. male admitted with sepsis and now diagnosed with pneumonia. PMH: atrial fibrillation and sick sinus syndrome status post pacemaker, history of aspiration pneumonia 3, hypertension, dementia, stroke.    PT Comments    Pt performed increased gait.  Balance deficits remain but patient refused use of AD to assist.  Pt tolerated 10 min standing trial with reaching and weight shifting at couter.  LOB x 1 during gait training when stopping and starting gait to avoid person in hall.  Will continue to follow during acute hospitalization.    Follow Up Recommendations  No PT follow up;Supervision for mobility/OOB     Equipment Recommendations  None recommended by PT    Recommendations for Other Services       Precautions / Restrictions Precautions Precautions: Fall Precaution Comments: pacemaker Restrictions Weight Bearing Restrictions: No    Mobility  Bed Mobility               General bed mobility comments: Pt in bathroom performing perianal care with wife present on arrival   Transfers Overall transfer level: Needs assistance Equipment used: None (encouraged use of cane, pt refused.  ) Transfers: Sit to/from Stand Sit to Stand: Supervision         General transfer comment: supervision for safety  Ambulation/Gait Ambulation/Gait assistance: Min guard Ambulation Distance (Feet): 600 Feet (LOB x1 to left) Assistive device: None Gait Pattern/deviations: Step-through pattern;Trunk flexed Gait velocity: decreased   General Gait Details: limited stride lenght bilaterally with mild instability, LOB x1.     Stairs            Wheelchair Mobility    Modified Rankin (Stroke Patients Only)       Balance Overall balance assessment: Needs assistance   Sitting balance-Leahy Scale: Normal        Standing balance-Leahy Scale: Fair                      Cognition Arousal/Alertness: Awake/alert Behavior During Therapy: WFL for tasks assessed/performed Overall Cognitive Status: Within Functional Limits for tasks assessed           Safety/Judgement: Decreased awareness of safety          Exercises      General Comments        Pertinent Vitals/Pain Pain Assessment: No/denies pain    Home Living                      Prior Function            PT Goals ([place] goals can now be found in the care plan section) Acute Rehab PT Goals Patient Stated Goal: get back home Potential to Achieve Goals: Good Progress towards PT goals: Progressing toward goals    Frequency  Min 3X/week    PT Plan [place] plan remains appropriate    Co-evaluation             End of Session Equipment Utilized During Treatment: Gait belt Activity Tolerance: Patient tolerated treatment well Patient left: in chair;with call bell/phone within reach;with chair alarm set;with nursing/sitter in room     Time: [id number] PT Time Calculation (min) (ACUTE ONLY): 23 min  Charges:  $Gait Training: 8-22 mins $Therapeutic Exercise: 8-22 mins  G Codes:      Cristela Blue 01/05/2016, 5:41 PM Governor Rooks, PTA pager [phone]'

## 2016-01-06 DIAGNOSIS — A419 Sepsis, unspecified organism: Secondary | ICD-10-CM | POA: Diagnosis not present

## 2016-01-06 LAB — LEGIONELLA PNEUMOPHILA SEROGP 1 UR AG: L. PNEUMOPHILA SEROGP 1 UR AG: NEGATIVE

## 2016-01-06 LAB — BASIC METABOLIC PANEL
ANION GAP: 6 (ref 5–15)
BUN: 32 mg/dL — ABNORMAL HIGH (ref 6–20)
CALCIUM: 8.9 mg/dL (ref 8.9–10.3)
CO2: 23 mmol/L (ref 22–32)
CREATININE: 1.85 mg/dL — AB (ref 0.61–1.24)
Chloride: 114 mmol/L — ABNORMAL HIGH (ref 101–111)
GFR calc Af Amer: 37 mL/min — ABNORMAL LOW (ref >60)
GFR calc non Af Amer: 32 mL/min — ABNORMAL LOW (ref >60)
GLUCOSE: 118 mg/dL — AB (ref 65–99)
Potassium: 3.7 mmol/L (ref 3.5–5.1)
Sodium: 143 mmol/L (ref 135–145)

## 2016-01-06 LAB — PROTIME-INR
INR: 3 — ABNORMAL HIGH (ref 0.00–1.49)
Prothrombin Time: 30.6 seconds — ABNORMAL HIGH (ref 11.6–15.2)

## 2016-01-06 LAB — EXPECTORATED SPUTUM ASSESSMENT W GRAM STAIN, RFLX TO RESP C: Special Requests: NORMAL

## 2016-01-06 LAB — CBC
HEMATOCRIT: 31.3 % — AB (ref 39.0–52.0)
Hemoglobin: 9.9 g/dL — ABNORMAL LOW (ref 13.0–17.0)
MCH: 31.3 pg (ref 26.0–34.0)
MCHC: 31.6 g/dL (ref 30.0–36.0)
MCV: 99.1 fL (ref 78.0–100.0)
Platelets: 149 10*3/uL — ABNORMAL LOW (ref 150–400)
RBC: 3.16 MIL/uL — ABNORMAL LOW (ref 4.22–5.81)
RDW: 14.1 % (ref 11.5–15.5)
WBC: 7.1 10*3/uL (ref 4.0–10.5)

## 2016-01-06 LAB — EXPECTORATED SPUTUM ASSESSMENT W REFEX TO RESP CULTURE

## 2016-01-06 MED ORDER — AMOXICILLIN-POT CLAVULANATE 500-125 MG PO TABS: 1.0000 | ORAL_TABLET | Freq: Three times a day (TID) | ORAL | Status: DC

## 2016-01-06 MED ORDER — WARFARIN SODIUM 5 MG PO TABS: 2.5000 mg | ORAL_TABLET | Freq: Every day | ORAL | Status: DC

## 2016-01-06 MED ORDER — SACCHAROMYCES BOULARDII 250 MG PO CAPS: 250.0000 mg | ORAL_CAPSULE | Freq: Two times a day (BID) | ORAL | Status: DC

## 2016-01-06 MED ORDER — WARFARIN SODIUM 1 MG PO TABS
1.0000 mg | ORAL_TABLET | Freq: Once | ORAL | Status: AC
Start: 2016-01-06 — End: 2016-01-06
  Administered 2016-01-06: 1 mg via ORAL
  Filled 2016-01-06: qty 1

## 2016-01-06 NOTE — Progress Notes (Signed)
Pt stated could apply the CPAP when he was ready to go to sleep. RT will continue to monitor.

## 2016-01-06 NOTE — Progress Notes (Signed)
Pt discharge education and instructions completed with pt and spouse at bedside; both voices understanding and denies any questions. Pt IV and telemetry removed; pt discharge home with spouse to transport him home. Pt spouse handed the prescriptions for Augmentin and Florastor. Pt transported off unit via wheelchair with spouse and belongings to the side. Delia Heady RN

## 2016-01-06 NOTE — Care Management Note (Signed)
Case Management Note  Patient Details  Name: [name] MRN: [id number] Date of Birth: July 19, 1929  Subjective/Objective:     82 yr old gentleman admitted with sepsis, pneumonia.               Action/Plan: Case manager spoke with patient and wife concerning home health needs. Patient feels confident he will be fine at discharge, wife states he has cognitive deficits. Choice offered for Home Health, referral called to [facility], [facility] Liaison.    Expected Discharge Date:    01/06/16              Expected Discharge Plan:  [place]  In-House Referral:     Discharge planning Services  CM Consult  Post Acute Care Choice:  Home Health Choice offered to:  Spouse, Patient  DME Arranged:    DME Agency:  NA  HH Arranged:  PT, Nurse's Aide [place] Agency:  [place]  Status of Service:  Completed, signed off  If discussed at [place] of Stay Meetings, dates discussed:    Additional Comments:  Ninfa Meeker, RN 01/06/2016, 2:09 PM

## 2016-01-06 NOTE — Progress Notes (Signed)
ANTICOAGULATION CONSULT NOTE  Pharmacy Consult for warfarin Indication: atrial fibrillation    No Known Allergies  Patient Measurements: Height: 5\' 10"  (177.8 cm) Weight: 170 lb (77.111 kg) IBW/kg (Calculated) : 73  Vital Signs: Temp: 98.5 F (36.9 C) (07/05 0551) Temp Source: Oral (07/05 0551) BP: 136/74 mmHg (07/05 0551) Pulse Rate: 70 (07/05 0551)  Labs:  Recent Labs  01/04/16 0348 01/05/16 0258 01/06/16 0244  HGB 10.8* 10.5* 9.9*  HCT 34.1* 33.2* 31.3*  PLT 122* 134* 149*  LABPROT 30.6* 36.5* 30.6*  INR 3.00* 3.79* 3.00*  CREATININE 1.92* 1.83* 1.85*    Estimated Creatinine Clearance: 30.7 mL/min (by C-G formula based on Cr of 1.85).  Assessment: 18 YOM on warfarin for AFib that presented with sepsis on 01/02/16.   Anticoag: Warfarin for Afib Home dose if 5mg  daily except 2.5mg  on MWF. INR therapeutic on admission at 2.2. Last dose taken 6/30.   INR therapeutic at 3.00.  Hgb 9.9, Plts 149, no bleeding noted.  Goal of Therapy:  INR 2-3 Monitor platelets by anticoagulation protocol: Yes   Plan:  -Warfarin 1 mg x 1 tonight -daily INR, CBC every 3 days  -follow for s/s bleeding   Bennye Alm, PharmD Pharmacy Resident [phone]  01/06/2016 8:38 AM

## 2016-01-06 NOTE — Discharge Summary (Signed)
[name], is a 82 y.o. male  DOB 11-27-1929  MRN [id number].  Admission date:  01/02/2016  Admitting Physician  Venetia Maxon Rama, MD  Discharge Date:  01/06/2016   Primary MD  Irven Shelling, MD  Recommendations for primary care physician for things to follow:  - Please check CBC, BMP in 1 week - Patient instructed to check INR in 3-5 days - Patient will need repeat CT chest without contrast in 3 months regarding follow-up on lung nodules. - Patient to follow with cardiology as an outpatient regarding  Echodensity in posterior pericardial space of uncertain etiology  Admission Diagnosis  Elevated serum creatinine [R74.8] SIRS (systemic inflammatory response syndrome) (HCC) [R65.10]   Discharge Diagnosis  Elevated serum creatinine [R74.8] SIRS (systemic inflammatory response syndrome) (HCC) [R65.10]    Principal Problem:   Sepsis ([place]) Active Problems:   Obstructive sleep apnea   FIBRILLATION, ATRIAL   Esophageal reflux   SIRS (systemic inflammatory response syndrome) (HCC)      Past Medical History  Diagnosis Date  . Allergic rhinitis   . RLS (restless legs syndrome)   . Permanent atrial fibrillation (HCC)     on coumadin  . Esophageal reflux   . Bronchitis   . Normal nuclear stress test 2013  . Transitional cell carcinoma ([place])     s/p nephrectomy in  2010  . Complete heart block ([place])     pacemaker originally placed in 1988  . BPH (benign prostatic hyperplasia)   . Lung nodule     noted on CXR December 2012  . Hypertension   . Dementia   . Pneumonia   . Renal insufficiency     Rt kidney removed d/t maglinant tumor  . Arthritis   . Presence of permanent cardiac pacemaker   . Stroke [facility])     TIA greater than 20 years  . OSA (obstructive sleep apnea)     uses cpap    Past Surgical History  Procedure Laterality Date  . Pacemaker insertion  1988    most recent  generator change by Dr Rayann Heman 20067/2/13 with new right ventricular lead placed  . Nephrectomy  2010    rt  . Hernia repair      multiple  . US echocardiography  04/22/2008    EF 55-60%  . Doppler echocardiography  03/26/2001    EF 55%  . Cardiovascular stress test  03/23/2009    EF 65%, NORMAL  . Eye surgery      tumor removed from Rt eye lid  . Mass excision Left 11/07/2012    Procedure: MINOR EXCISION OF MASS;  Surgeon: Haywood Lasso, MD;  Location: [place];  Service: General;  Laterality: Left;  . Permanent pacemaker generator change N/A 01/03/2012    Procedure: PERMANENT PACEMAKER GENERATOR CHANGE;  Surgeon: Thompson Grayer, MD;  Location: [facility] CATH LAB;  Service: Cardiovascular;  Laterality: N/A;  . Cataract extraction w/phaco Left 08/26/2015    Procedure: CATARACT EXTRACTION PHACO AND INTRAOCULAR LENS PLACEMENT (IOC);  Surgeon: Marylynn Pearson, MD;  Location: [place];  Service: Ophthalmology;  Laterality: Left;       History of present illness and  Hospital Course:     Kindly see H&P for history of present illness and admission details, please review complete Labs, Consult reports and Test reports for all details in brief  HPI  from the history and physical done on the day of admission 01/02/2016 Tonny Boisseau Doring is an 82 y.o. male with a PMH of atrial fibrillation and sick sinus syndrome status post pacemaker, OSA on nasal CPAP, and prior history of aspiration pneumonia 3 presents with a chief complaint of upper respiratory symptoms. The patient's symptoms initially began 3 days ago with sore throat and cough. This was followed by progressive weakness and loss of energy. The patient saw his PCP on 01/01/16 where a rapid strep test was performed and was negative. His blood pressure was noted to be low at that time. This morning, the patient developed fever up to 103.5 at home with ongoing dry cough and chest congestion. According to family members, he was confused more than  usual and was subsequently brought to the hospital via EMS.  ED Course: Code sepsis called in the ED. Chest x-ray negative for acute abnormalities. Urinalysis negative for nitrites but did show leukocytes and many bacteria. Specimen was contaminated with squamous cells. WBC 12.1. Lactic acid WNL. Given a dose of Rocephin and azithromycin as well as fluid volume resuscitation.    Hospital Course   Sepsis presented on admission  -  fever of 104.4, RR 22, leukocytosis wbc>12, lactic acidosis, cr elevation, source of infection from bronchitis/pna/uti, patient does has significant respiratory symptom, but denies urinary symptom. ivf/ abx, better.   PNA/bronchitis:  - community acquired vs aspiration pna. Initial cxr ? Developing pna, CT renal did reveal left low lobe opacity and possible small pleural effusion. He does has congested cough, on exam with significant rhonchi, more on the left, CT chest + bronchial wall thickening, ? Pna vs edema, clinically patient is dry, ct findings likely consistent with infection, strepneumo antigen negative, legionella antigen in process, Swallow eval unremarkable.  - Treated with 4 days of IV Zosyn, definite synovitis on by mouth Augmentin as an outpatient. - 23mm Right lung nodule on CT chest, need to repeat CT chest in three months, family made aware, discussed with wife at bedside, she understands importance of follow-up  UTI: he does has h/o bph, he denies significant urinary symptom, urine culture enterococcus pan sensitive. Already on abx.  CKDIII,  - h/o nephrectomy, no solitary kidney, cr 189-1.80-1.92, cr in 08/2015 1.48, but his baseline is 1.6-1.7 over few years  - CT renal no obstruction, .  Diarrhea?  - Patient was prescribed cipro/flagyl for diarrhea prior to coming to the hospital, but he has not taken it. - No ab pain, does have Abdominal distension initially on exam, negative c diff. Abdominal exam benign a day of discharge.   H/o Afib,  with h/o cva, h/o heart block s/p pacemaker, currently paced rhythm, on Atenolol, warfarin.   Cardiomegaly: EchocardiogramNormal LV systolic function; mild LVH; biatrial enlargement; calcified aortic valve with mild AS and trace AI; mild MR; mild  TR; moderately elevated pulmonary pressure. Echodensity in posterior pericardial space of uncertain etiology; suggest  cardiac MRI to further assess. Defer outpatient cardiology to determine cardiac MRI   osa on cpap  Mild dementia:  - baseline independent    Discharge Condition:  stable   Follow UP  Follow-up Information    Follow up with Irven Shelling, MD In 1 week.   Specialty:  Internal Medicine   Why:  hospital discharge follow up, repeat cbc/bmp at follow up, monitor renal function, repeat chest ct in three months to follow up on right upper lobe nodule   Contact information:   [address] [place] [address] [phone]       Follow up with coumadin clinic.   Why:  in 3-5 days for INR check      Follow up with [facility].   Why:  [facility] will contact you to arrange start date and time for therapy.   Contact information:   [place] [phone]         Discharge Instructions  and  Discharge Medications     Discharge Instructions    Discharge instructions    Complete by:  As directed   Follow with Primary MD Irven Shelling, MD in 7 days  - please have your INR checked in 3-5 days.  Get CBC, CMP,  checked  by Primary MD next visit.    Activity: As tolerated with Full fall precautions use walker/cane & assistance as needed   Disposition Home **   Diet: Heart Healthy ** , with feeding assistance and aspiration precautions.  For Heart failure patients - Check your Weight same time everyday, if you gain over 2 pounds, or you develop in leg swelling, experience more shortness of breath or chest  pain, call your Primary MD immediately. Follow Cardiac Low Salt Diet and 1.5 lit/day fluid restriction.   On your next visit with your primary care physician please Get Medicines reviewed and adjusted.   Please request your Prim.MD to go over all Hospital Tests and Procedure/Radiological results at the follow up, please get all Hospital records sent to your Prim MD by signing hospital release before you go home.   If you experience worsening of your admission symptoms, develop shortness of breath, life threatening emergency, suicidal or homicidal thoughts you must seek medical attention immediately by calling 911 or calling your MD immediately  if symptoms less severe.  You Must read complete instructions/literature along with all the possible adverse reactions/side effects for all the Medicines you take and that have been prescribed to you. Take any new Medicines after you have completely understood and accpet all the possible adverse reactions/side effects.   Do not drive, operating heavy machinery, perform activities at heights, swimming or participation in water activities or provide baby sitting services if your were admitted for syncope or siezures until you have seen by Primary MD or a Neurologist and advised to do so again.  Do not drive when taking Pain medications.    Do not take more than prescribed Pain, Sleep and Anxiety Medications  Special Instructions: If you have smoked or chewed Tobacco  in the last 2 yrs please stop smoking, stop any regular Alcohol  and or any Recreational drug use.  Wear Seat belts while driving.   Please note  You were cared for by a hospitalist during your hospital stay. If you have any questions about your discharge medications or the care you received while you were in the hospital after you are discharged, you can call the unit and asked to speak with the hospitalist on call if the hospitalist that took care of you is not available. Once you are  discharged, your primary care physician  will handle any further medical issues. Please note that NO REFILLS for any discharge medications will be authorized once you are discharged, as it is imperative that you return to your primary care physician (or establish a relationship with a primary care physician if you do not have one) for your aftercare needs so that they can reassess your need for medications and monitor your lab values.     Face-to-face encounter (required for Medicare/Medicaid patients)    Complete by:  As directed   I Xu,Fang certify that this patient is under my care and that I, or a nurse practitioner or physician's assistant working with me, had a face-to-face encounter that meets the physician face-to-face encounter requirements with this patient on 01/05/2016. The encounter with the patient was in whole, or in part for the following medical condition(s) which is the primary reason for home health care (List medical condition): FTT, RN for medication supervision and INR monitoring, check INR and report INR result to cardiology coumadin clinic.  The encounter with the patient was in whole, or in part, for the following medical condition, which is the primary reason for home health care:  FTT  I certify that, based on my findings, the following services are medically necessary home health services:  Nursing  Reason for Medically Necessary Home Health Services:  Skilled Nursing- Change/Decline in Patient Status  My clinical findings support the need for the above services:  Shortness of breath with activity  Further, I certify that my clinical findings support that this patient is homebound due to:  Shortness of Breath with activity     Home Health    Complete by:  As directed   To provide the following care/treatments:   [facility]       Increase activity slowly    Complete by:  As directed             Medication List    STOP taking these medications         ciprofloxacin 500 MG tablet  Commonly known as:  CIPRO     metroNIDAZOLE 500 MG tablet  Commonly known as:  FLAGYL      TAKE these medications        acetaminophen 325 MG tablet  Commonly known as:  TYLENOL  Take 650 mg by mouth daily as needed for mild pain or fever.     amoxicillin-clavulanate 500-125 MG tablet  Commonly known as:  AUGMENTIN  Take 1 tablet (500 mg total) by mouth 3 (three) times daily.  Start taking on:  01/07/2016     aspirin 81 MG tablet  Take 81 mg by mouth every morning.     atenolol 25 MG tablet  Commonly known as:  TENORMIN  Take 1 tablet (25 mg total) by mouth 2 (two) times daily.     clonazePAM 2 MG tablet  Commonly known as:  KLONOPIN  TAKE 1 TABLET AT BEDTIME FOR SLEEP AS NEEDED     cycloSPORINE 0.05 % ophthalmic emulsion  Commonly known as:  RESTASIS  Place 1 drop into both eyes 2 (two) times daily.     doxazosin 2 MG tablet  Commonly known as:  CARDURA  Take 2 mg by mouth daily.     finasteride 5 MG tablet  Commonly known as:  PROSCAR  Take 1 tablet (5 mg total) by mouth daily.     galantamine 8 MG tablet  Commonly known as:  RAZADYNE  Take 8 mg by  mouth 2 (two) times daily.     glucosamine-chondroitin 500-400 MG tablet  Take 2 tablets by mouth every evening.     guaiFENesin 600 MG 12 hr tablet  Commonly known as:  MUCINEX  Take 1 tablet (600 mg total) by mouth 2 (two) times daily.     hydroxypropyl methylcellulose / hypromellose 2.5 % ophthalmic solution  Commonly known as:  ISOPTO TEARS / GONIOVISC  Place 1 drop into both eyes 4 (four) times daily as needed for dry eyes.     ipratropium 0.06 % nasal spray  Commonly known as:  ATROVENT  USE 2 SPRAYS IN EACH NOSTRIL THREE TIMES DAILY AS NEEDED FOR RHINITIS.     multivitamin capsule  Take 1 capsule by mouth daily.     OVER THE COUNTER MEDICATION  Apply 1 application topically at bedtime. Eye gel     saccharomyces boulardii 250 MG capsule  Commonly known as:  FLORASTOR    Take 1 capsule (250 mg total) by mouth 2 (two) times daily.     warfarin 5 MG tablet  Commonly known as:  COUMADIN  Take 0.5-1 tablets (2.5-5 mg total) by mouth daily at 6 PM. Take 1/2 tablet (2.5mg ) on Mon, Wed, and Fri. Take 1 tablet (5mg ) on Sun, Tues, Thurs, and Sat.  Start taking on:  01/07/2016      ASK your doctor about these medications        furosemide 40 MG tablet  Commonly known as:  LASIX  Take 1 tablet (40 mg total) by mouth daily.          Diet and Activity recommendation: See Discharge Instructions above   Consults obtained -  None   Major procedures and Radiology Reports - PLEASE review detailed and final reports for all details, in brief -     Dg Chest 2 View  01/02/2016  CLINICAL DATA:  82 year old male with shortness of breath and cough and fever. Possible pneumonia. EXAM: CHEST  2 VIEW COMPARISON:  Chest radiograph dated 01/02/2016 FINDINGS: Two views of the chest demonstrate stable moderate cardiomegaly with central vascular prominence compatible with congestive changes. Bibasilar linear densities, likely atelectatic changes. There is no focal consolidation, pleural effusion, or pneumothorax. Right pectoral pacemaker device. There is osteopenia with degenerative changes of the spine. No acute fracture. IMPRESSION: Cardiomegaly with mild congestive changes. Bibasilar atelectatic changes. Developing pneumonia is not excluded. Clinical correlation is recommended. No focal consolidation. Electronically Signed   By: Anner Crete M.D.   On: 01/02/2016 23:07   Dg Chest 2 View  01/02/2016  CLINICAL DATA:  Dry cough with sore throat for several days, initial encounter EXAM: CHEST  2 VIEW COMPARISON:  08/07/2014 FINDINGS: Cardiac shadow remains enlarged. A pacing device is again seen and stable. The lungs are well aerated bilaterally. Mild increased density is noted in the region of the middle lobe on the lateral projection without definitive infiltrate on the frontal  images. This is likely related to some scarring as seen on previous CTs. No sizable effusion is noted. No bony abnormality is seen. IMPRESSION: No acute abnormality noted. Electronically Signed   By: Inez Catalina M.D.   On: 01/02/2016 11:52   Ct Chest Wo Contrast  01/03/2016  CLINICAL DATA:  Follow-up recurrent pneumonia. History of esophageal reflux, bronchitis, dementia, pneumonia. EXAM: CT CHEST WITHOUT CONTRAST TECHNIQUE: Multidetector CT imaging of the chest was performed following the standard protocol without IV contrast. COMPARISON:  Chest radiograph January 02, 2016 and CT chest August 29, 2014 and CT abdomen and pelvis January 02, 2016 FINDINGS: Cardiovascular: Similar least moderate cardiomegaly with small chronic pericardial effusion. Mild coronary artery calcifications in place. RIGHT cardiac pacemaker in situ. Thoracic aorta is normal in course and caliber with moderate calcific atherosclerosis. Mediastinum/Nodes: No definite lymphadenopathy though limited assessment by noncontrast CT. Similar subcentimeter precarinal lymph node. Lungs/Pleura: Small bilateral pleural effusions and dependent atelectasis. Superimposed patchy ground-glass opacities RIGHT upper lobe, posterior segment and, RIGHT middle lobe with tiny centrilobular ground-glass nodules in RIGHT lung and to lesser extent LEFT lower lobe. Bronchial wall thickening tracheobronchial tree is patent and midline. 5 mm RIGHT lower lobe pulmonary nodule, image 92/142 which appears to represent prior 5 mm pulmonary nodule. 2 mm RIGHT normal lung pulmonary nodule. 2 mm subpleural RIGHT upper lobe pulmonary nodules likely benign. 3 mm calcified granuloma LEFT upper lobe. 7 mm sub solid pulmonary nodule RIGHT upper lobe abutting the fissure. Upper Abdomen: Calcified splenic granulomas. 2.7 cm LEFT upper pole renal cyst partially imaged. Small amount of ascites in the included RIGHT upper quadrant with linear air density anterior to the liver corresponding  to known colonic intra positioning. Musculoskeletal: RIGHT transversus abdominus small lipoma. Mild dependent subcutaneous edema. IMPRESSION: Similar cardiomegaly. Small pleural effusions and bronchial wall thickening, which can be seen with pulmonary edema or bronchitis. Patchy ground-glass opacities RIGHT greater than LEFT lungs concerning for pneumonia, alternatively confluent edema. 5 mm RIGHT lower lobe pulmonary nodule stable from August 29, 2014. 8 mm RIGHT upper lobe sub solid pulmonary nodule. Follow-up by chest CT without contrast is recommended in 3 months to confirm persistence. This recommendation follows the consensus statement: Recommendations for the Management of Subsolid Pulmonary Nodules Detected at CT: A Statement from the [place] as published in Radiology 2013; 266:304-317. Electronically Signed   By: Elon Alas M.D.   On: 01/03/2016 19:18   Ct Renal Stone Study  01/02/2016  CLINICAL DATA:  Acute renal failure.  Fever.  Possible sepsis. EXAM: CT ABDOMEN AND PELVIS WITHOUT CONTRAST TECHNIQUE: Multidetector CT imaging of the abdomen and pelvis was performed following the standard protocol without IV contrast. COMPARISON:  06/14/2011 FINDINGS: There are unremarkable unenhanced appearances of the liver, gallbladder and bile ducts. There are unremarkable unenhanced appearances of the pancreas, spleen and adrenals except for a few splenic granulomata. There are unremarkable appearances of the right nephrectomy bed. Left kidney is negative for hydronephrosis. There is an upper pole 2.5 cm left renal cyst, decreased in size from 06/14/2011 when it measured 5.9 cm. There also is a 2 cm cyst of the lateral aspect of the left renal midpole, unchanged. The left collecting system and ureter are unremarkable. Urinary bladder is nearly completely empty but grossly unremarkable. The abdominal aorta is normal in caliber. There is mild atherosclerotic calcification. There is no adenopathy in  the abdomen or pelvis. Bowel is unremarkable. There is no adenopathy.  There is small volume perihepatic ascites. No significant skeletal lesion. Moderate lumbar facet arthritis from L4 through S1 with a grade 1 spondylolisthesis at L4-5. Noncalcified 5.5 mm nodule in the posterior right lower lobe periphery, axial image 4 series 205. This is indeterminate. Mild stable scarring in the right middle lobe and lingular base. Mild patchy atelectatic appearing opacity in the posterior lower lobes, left greater than right. Trace left pleural effusion IMPRESSION: 1. Unremarkable appearances of the right nephrectomy bed. Solitary left kidney is notable only for benign cysts. 2. Small volume perihepatic ascites. 3. Indeterminate 5.5 mm nodule at the posterior periphery of  the right lower lobe. Mild atelectatic appearing opacity in the left lower lobe posteriorly. Trace left pleural effusion. Follow-up radiography or chest CT recommended, depending on level of clinical suspicion. Electronically Signed   By: Andreas Newport M.D.   On: 01/02/2016 23:00    Micro Results     Recent Results (from the past 240 hour(s))  Blood Culture (routine x 2)     Status: None (Preliminary result)   Collection Time: 01/02/16 11:05 AM  Result Value Ref Range Status   Specimen Description BLOOD RIGHT ANTECUBITAL  Final   Special Requests BOTTLES DRAWN AEROBIC AND ANAEROBIC 5CC  Final   Culture NO GROWTH 3 DAYS  Final   Report Status PENDING  Incomplete  Blood Culture (routine x 2)     Status: None (Preliminary result)   Collection Time: 01/02/16 11:15 AM  Result Value Ref Range Status   Specimen Description BLOOD LEFT FOREARM  Final   Special Requests BOTTLES DRAWN AEROBIC AND ANAEROBIC 5CC  Final   Culture NO GROWTH 3 DAYS  Final   Report Status PENDING  Incomplete  Urine culture     Status: Abnormal   Collection Time: 01/02/16 11:28 AM  Result Value Ref Range Status   Specimen Description URINE, CLEAN CATCH  Final    Special Requests NONE  Final   Culture >=100,000 COLONIES/mL ENTEROCOCCUS SPECIES (A)  Final   Report Status 01/04/2016 FINAL  Final   Organism ID, Bacteria ENTEROCOCCUS SPECIES (A)  Final      Susceptibility   Enterococcus species - MIC*    AMPICILLIN <=2 SENSITIVE Sensitive     LEVOFLOXACIN 1 SENSITIVE Sensitive     NITROFURANTOIN <=16 SENSITIVE Sensitive     VANCOMYCIN 1 SENSITIVE Sensitive     * >=100,000 COLONIES/mL ENTEROCOCCUS SPECIES  Respiratory Panel by PCR     Status: None   Collection Time: 01/02/16  4:52 PM  Result Value Ref Range Status   Adenovirus NOT DETECTED NOT DETECTED Final   Coronavirus 229E NOT DETECTED NOT DETECTED Final   Coronavirus HKU1 NOT DETECTED NOT DETECTED Final   Coronavirus NL63 NOT DETECTED NOT DETECTED Final   Coronavirus OC43 NOT DETECTED NOT DETECTED Final   Metapneumovirus NOT DETECTED NOT DETECTED Final   Rhinovirus / Enterovirus NOT DETECTED NOT DETECTED Final   Influenza A NOT DETECTED NOT DETECTED Final   Influenza A H1 NOT DETECTED NOT DETECTED Final   Influenza A H1 2009 NOT DETECTED NOT DETECTED Final   Influenza A H3 NOT DETECTED NOT DETECTED Final   Influenza B NOT DETECTED NOT DETECTED Final   Parainfluenza Virus 1 NOT DETECTED NOT DETECTED Final   Parainfluenza Virus 2 NOT DETECTED NOT DETECTED Final   Parainfluenza Virus 3 NOT DETECTED NOT DETECTED Final   Parainfluenza Virus 4 NOT DETECTED NOT DETECTED Final   Respiratory Syncytial Virus NOT DETECTED NOT DETECTED Final   Bordetella pertussis NOT DETECTED NOT DETECTED Final   Chlamydophila pneumoniae NOT DETECTED NOT DETECTED Final   Mycoplasma pneumoniae NOT DETECTED NOT DETECTED Final  MRSA PCR Screening     Status: None   Collection Time: 01/03/16  7:02 PM  Result Value Ref Range Status   MRSA by PCR NEGATIVE NEGATIVE Final    Comment:        The GeneXpert MRSA Assay (FDA approved for NASAL specimens only), is one component of a comprehensive MRSA  colonization surveillance program. It is not intended to diagnose MRSA infection nor to guide or monitor treatment for MRSA  infections.   C difficile quick scan w PCR reflex     Status: None   Collection Time: 01/03/16  8:45 PM  Result Value Ref Range Status   C Diff antigen NEGATIVE NEGATIVE Final   C Diff toxin NEGATIVE NEGATIVE Final   C Diff interpretation Negative for toxigenic C. difficile  Final  Culture, expectorated sputum-assessment     Status: None   Collection Time: 01/06/16 10:19 AM  Result Value Ref Range Status   Specimen Description EXPECTORATED SPUTUM  Final   Special Requests NORMAL  Final   Sputum evaluation   Final    THIS SPECIMEN IS ACCEPTABLE. RESPIRATORY CULTURE REPORT TO FOLLOW.   Report Status 01/06/2016 FINAL  Final  Culture, respiratory (NON-Expectorated)     Status: None (Preliminary result)   Collection Time: 01/06/16 10:19 AM  Result Value Ref Range Status   Specimen Description SPUTUM  Final   Special Requests NONE  Final   Gram Stain   Final    ABUNDANT WBC PRESENT,BOTH PMN AND MONONUCLEAR NO ORGANISMS SEEN    Culture PENDING  Incomplete   Report Status PENDING  Incomplete       Today   Subjective:   Haygen Kizziah today has no headache,no chest or abdominal pain,no new weakness tingling or numbness, feels much better wants to go home today.   Objective:   Blood pressure 126/76, pulse 71, temperature 99.1 F (37.3 C), temperature source Oral, resp. rate 18, height 5\' 10"  (1.778 m), weight 77.111 kg (170 lb), SpO2 97 %.   Intake/Output Summary (Last 24 hours) at 01/06/16 1419 Last data filed at 01/06/16 1300  Gross per 24 hour  Intake    870 ml  Output      0 ml  Net    870 ml    Exam Awake Alert, Oriented x 3, No new F.N deficits, Normal affect [place].AT,PERRAL Supple Neck,No JVD, No cervical lymphadenopathy appriciated.  Symmetrical Chest wall movement, Good air movement bilaterally, CTAB Based rhythm on telemetry,No  Gallops,No Parasternal Heave +ve B.Sounds, Abd Soft, Non tender, No organomegaly appriciated, No rebound -guarding or rigidity. No Cyanosis, Clubbing or edema, No new Rash or bruise  Data Review   CBC w Diff: Lab Results  Component Value Date   WBC 7.1 01/06/2016   HGB 9.9* 01/06/2016   HCT 31.3* 01/06/2016   PLT 149* 01/06/2016   LYMPHOPCT 4 01/02/2016   MONOPCT 4 01/02/2016   EOSPCT 0 01/02/2016   BASOPCT 0 01/02/2016    CMP: Lab Results  Component Value Date   NA 143 01/06/2016   K 3.7 01/06/2016   CL 114* 01/06/2016   CO2 23 01/06/2016   BUN 32* 01/06/2016   CREATININE 1.85* 01/06/2016   PROT 5.6* 01/04/2016   ALBUMIN 2.7* 01/04/2016   BILITOT 0.9 01/04/2016   ALKPHOS 76 01/04/2016   AST 24 01/04/2016   ALT 23 01/04/2016  .   Total Time in preparing paper work, data evaluation and todays exam - 35 minutes  Conchetta Lamia M.D on 01/06/2016 at 2:19 PM  [facility]   Office  [phone]

## 2016-01-06 NOTE — Progress Notes (Signed)
Pt ambulated 1149ft with RN in the hallways; pt was in the 90's on RA during ambulation. Pt denied any sob, distress or discomfort. Pt back to room with call light within reach and spouse at bedside. Will continue to closely monitor. Delia Heady RN

## 2016-01-07 LAB — CULTURE, BLOOD (ROUTINE X 2)
CULTURE: NO GROWTH
Culture: NO GROWTH

## 2016-01-09 DIAGNOSIS — G2581 Restless legs syndrome: Secondary | ICD-10-CM | POA: Diagnosis not present

## 2016-01-09 DIAGNOSIS — K219 Gastro-esophageal reflux disease without esophagitis: Secondary | ICD-10-CM | POA: Diagnosis not present

## 2016-01-09 DIAGNOSIS — N400 Enlarged prostate without lower urinary tract symptoms: Secondary | ICD-10-CM | POA: Diagnosis not present

## 2016-01-09 DIAGNOSIS — I10 Essential (primary) hypertension: Secondary | ICD-10-CM | POA: Diagnosis not present

## 2016-01-09 DIAGNOSIS — R269 Unspecified abnormalities of gait and mobility: Secondary | ICD-10-CM | POA: Diagnosis not present

## 2016-01-09 DIAGNOSIS — G4733 Obstructive sleep apnea (adult) (pediatric): Secondary | ICD-10-CM | POA: Diagnosis not present

## 2016-01-09 DIAGNOSIS — F0390 Unspecified dementia without behavioral disturbance: Secondary | ICD-10-CM | POA: Diagnosis not present

## 2016-01-09 DIAGNOSIS — I482 Chronic atrial fibrillation: Secondary | ICD-10-CM | POA: Diagnosis not present

## 2016-01-10 LAB — CULTURE, RESPIRATORY

## 2016-01-10 LAB — CULTURE, RESPIRATORY W GRAM STAIN

## 2016-01-11 ENCOUNTER — Telehealth: Payer: Self-pay | Admitting: Internal Medicine

## 2016-01-11 DIAGNOSIS — R918 Other nonspecific abnormal finding of lung field: Secondary | ICD-10-CM | POA: Diagnosis not present

## 2016-01-11 DIAGNOSIS — N183 Chronic kidney disease, stage 3 (moderate): Secondary | ICD-10-CM | POA: Diagnosis not present

## 2016-01-11 DIAGNOSIS — Z7901 Long term (current) use of anticoagulants: Secondary | ICD-10-CM | POA: Diagnosis not present

## 2016-01-11 DIAGNOSIS — R531 Weakness: Secondary | ICD-10-CM | POA: Diagnosis not present

## 2016-01-11 DIAGNOSIS — Z79899 Other long term (current) drug therapy: Secondary | ICD-10-CM | POA: Diagnosis not present

## 2016-01-11 DIAGNOSIS — J189 Pneumonia, unspecified organism: Secondary | ICD-10-CM | POA: Diagnosis not present

## 2016-01-11 DIAGNOSIS — Z5181 Encounter for therapeutic drug level monitoring: Secondary | ICD-10-CM | POA: Diagnosis not present

## 2016-01-11 LAB — POCT INR: INR: 1.8

## 2016-01-11 NOTE — Telephone Encounter (Signed)
Returned call to pt's wife, advised pt's primary care MD Dr Laurann Montana with [facility] is not in the Epic system therefor we cannot see his INR from today.  Pt's wife reports INR is 1.8 which I advised her is too low given his goal of 2-3.  Advised her that I need a hardcopy of today's results faxed to me, I cannot dose verbal results from pt.  Advised wife of fax # directly to clinic and told her we would be glad to call her back with dosage instructions and reschedule pt's follow-up appt at that time once INR results received from primary care office.  Pt has been in the hospital 7/1-7/5 dx SIRS given IV Rocephin and Azithromycin.  Will await results from pt's primary care office.

## 2016-01-11 NOTE — Telephone Encounter (Signed)
New message      The wife is calling to give the PT/INR result and dose the pt coumadin, the primary drew the lab and the primary care physician is in the Epic system please look up  Lab result.

## 2016-01-12 ENCOUNTER — Ambulatory Visit (INDEPENDENT_AMBULATORY_CARE_PROVIDER_SITE_OTHER): Payer: PPO | Admitting: Cardiology

## 2016-01-12 DIAGNOSIS — I482 Chronic atrial fibrillation, unspecified: Secondary | ICD-10-CM

## 2016-01-12 DIAGNOSIS — Z5181 Encounter for therapeutic drug level monitoring: Secondary | ICD-10-CM

## 2016-01-12 NOTE — Telephone Encounter (Signed)
INR result was on fax machine this AM, telephoned pt to go over results but he wanted spouse to take results. See Anti-coag note

## 2016-01-21 ENCOUNTER — Ambulatory Visit (INDEPENDENT_AMBULATORY_CARE_PROVIDER_SITE_OTHER): Payer: PPO | Admitting: Pharmacist

## 2016-01-21 DIAGNOSIS — Z7901 Long term (current) use of anticoagulants: Secondary | ICD-10-CM | POA: Diagnosis not present

## 2016-01-21 DIAGNOSIS — I482 Chronic atrial fibrillation, unspecified: Secondary | ICD-10-CM

## 2016-01-21 DIAGNOSIS — I4891 Unspecified atrial fibrillation: Secondary | ICD-10-CM | POA: Diagnosis not present

## 2016-01-21 DIAGNOSIS — Z5181 Encounter for therapeutic drug level monitoring: Secondary | ICD-10-CM

## 2016-01-21 LAB — POCT INR: INR: 2.2

## 2016-01-22 DIAGNOSIS — K219 Gastro-esophageal reflux disease without esophagitis: Secondary | ICD-10-CM | POA: Diagnosis not present

## 2016-01-22 DIAGNOSIS — R269 Unspecified abnormalities of gait and mobility: Secondary | ICD-10-CM | POA: Diagnosis not present

## 2016-01-22 DIAGNOSIS — I10 Essential (primary) hypertension: Secondary | ICD-10-CM | POA: Diagnosis not present

## 2016-01-22 DIAGNOSIS — N400 Enlarged prostate without lower urinary tract symptoms: Secondary | ICD-10-CM | POA: Diagnosis not present

## 2016-01-22 DIAGNOSIS — G4733 Obstructive sleep apnea (adult) (pediatric): Secondary | ICD-10-CM | POA: Diagnosis not present

## 2016-01-22 DIAGNOSIS — I482 Chronic atrial fibrillation: Secondary | ICD-10-CM | POA: Diagnosis not present

## 2016-01-22 DIAGNOSIS — G2581 Restless legs syndrome: Secondary | ICD-10-CM | POA: Diagnosis not present

## 2016-01-22 DIAGNOSIS — F0390 Unspecified dementia without behavioral disturbance: Secondary | ICD-10-CM | POA: Diagnosis not present

## 2016-02-03 ENCOUNTER — Telehealth: Payer: Self-pay | Admitting: *Deleted

## 2016-02-03 ENCOUNTER — Ambulatory Visit
Admission: RE | Admit: 2016-02-03 | Discharge: 2016-02-03 | Disposition: A | Discharge status: Home / Self Care | Payer: PPO | Source: Ambulatory Visit | Attending: Internal Medicine | Admitting: Internal Medicine

## 2016-02-03 ENCOUNTER — Other Ambulatory Visit: Payer: Self-pay | Admitting: Internal Medicine

## 2016-02-03 DIAGNOSIS — I272 Other secondary pulmonary hypertension: Secondary | ICD-10-CM | POA: Diagnosis not present

## 2016-02-03 DIAGNOSIS — G4733 Obstructive sleep apnea (adult) (pediatric): Secondary | ICD-10-CM | POA: Diagnosis not present

## 2016-02-03 DIAGNOSIS — C659 Malignant neoplasm of unspecified renal pelvis: Secondary | ICD-10-CM | POA: Diagnosis not present

## 2016-02-03 DIAGNOSIS — J40 Bronchitis, not specified as acute or chronic: Secondary | ICD-10-CM

## 2016-02-03 DIAGNOSIS — R042 Hemoptysis: Secondary | ICD-10-CM | POA: Diagnosis not present

## 2016-02-03 DIAGNOSIS — J189 Pneumonia, unspecified organism: Secondary | ICD-10-CM | POA: Diagnosis not present

## 2016-02-03 DIAGNOSIS — R05 Cough: Secondary | ICD-10-CM | POA: Diagnosis not present

## 2016-02-03 DIAGNOSIS — Z7901 Long term (current) use of anticoagulants: Secondary | ICD-10-CM | POA: Diagnosis not present

## 2016-02-03 DIAGNOSIS — N401 Enlarged prostate with lower urinary tract symptoms: Secondary | ICD-10-CM | POA: Diagnosis not present

## 2016-02-03 DIAGNOSIS — R3916 Straining to void: Secondary | ICD-10-CM | POA: Diagnosis not present

## 2016-02-03 NOTE — Telephone Encounter (Signed)
Spoke with pt's wife and she states [name] is seeing PCP today and is being started on Levaquin 500 mg daily for 10 days for pneumonia . [name] instructed to have [name]  take Levaquin as ordered and take coumadin as ordered and made an appt for INR recheck on Monday August 7th. Also instructed to eat couple servings of greens from now until Monday  [name] states understanding. She also informs nurse that he is expectorating some streaks of blood in his sputum small amt at present . Instructed to monitor and call if becomes worse or if has any other concerns as this may be due to his coughing due to pneumonia.

## 2016-02-04 ENCOUNTER — Other Ambulatory Visit: Payer: Self-pay | Admitting: Internal Medicine

## 2016-02-07 ENCOUNTER — Encounter: Payer: Self-pay | Admitting: Internal Medicine

## 2016-02-08 ENCOUNTER — Encounter: Payer: Self-pay | Admitting: Internal Medicine

## 2016-02-08 ENCOUNTER — Ambulatory Visit (INDEPENDENT_AMBULATORY_CARE_PROVIDER_SITE_OTHER): Payer: PPO | Admitting: Internal Medicine

## 2016-02-08 ENCOUNTER — Ambulatory Visit (INDEPENDENT_AMBULATORY_CARE_PROVIDER_SITE_OTHER): Payer: PPO | Admitting: *Deleted

## 2016-02-08 ENCOUNTER — Encounter (INDEPENDENT_AMBULATORY_CARE_PROVIDER_SITE_OTHER): Payer: Self-pay

## 2016-02-08 VITALS — BP 114/72 | HR 74 | Ht 70.0 in | Wt 181.8 lb

## 2016-02-08 DIAGNOSIS — Z7901 Long term (current) use of anticoagulants: Secondary | ICD-10-CM

## 2016-02-08 DIAGNOSIS — I442 Atrioventricular block, complete: Secondary | ICD-10-CM

## 2016-02-08 DIAGNOSIS — R911 Solitary pulmonary nodule: Secondary | ICD-10-CM | POA: Diagnosis not present

## 2016-02-08 DIAGNOSIS — I4891 Unspecified atrial fibrillation: Secondary | ICD-10-CM | POA: Diagnosis not present

## 2016-02-08 DIAGNOSIS — G4733 Obstructive sleep apnea (adult) (pediatric): Secondary | ICD-10-CM | POA: Diagnosis not present

## 2016-02-08 DIAGNOSIS — R6510 Systemic inflammatory response syndrome (SIRS) of non-infectious origin without acute organ dysfunction: Secondary | ICD-10-CM

## 2016-02-08 DIAGNOSIS — Z5181 Encounter for therapeutic drug level monitoring: Secondary | ICD-10-CM | POA: Diagnosis not present

## 2016-02-08 DIAGNOSIS — K219 Gastro-esophageal reflux disease without esophagitis: Secondary | ICD-10-CM

## 2016-02-08 LAB — POCT INR: INR: 2.7

## 2016-02-08 NOTE — Patient Instructions (Addendum)
Order- future CT chest, no contrast,  Dx lung nodule     To be done in October before next ov w CDY  Finish the antibiotic and keep follow-up with Dr Laurann Montana as planned

## 2016-02-08 NOTE — Progress Notes (Signed)
Subjective:    Patient ID: [name], male    DOB: January 09, 1930, 82 y.o.   MRN: [id number]  HPI M never smoker followed for OSA, hx Cheynes-Stokes, Restless legs, complicated by CM/ AFib/ brady-tachy/ pacer, allergic rhinitis, bronchitis, GERD. Hx right nephrectomy for cancer..  05/26/14- 84 yoM never smoker followed for OSA, hx Cheynes-Stokes, Restless legs, complicated by CM/ AFib/ brady-tachy/ pacer, allergic rhinitis, bronchitis/ multiple granulomas, GERD. Hx right nephrectomy for cancer. FOLLOWS FOR: Wears CPAP 7/ Advanced every night for about 7-8 hours; pressure working well for patient; DME is [facility]. Recent problems with his pacemaker were addressed by increasing the pacing rate last week. Hopefully this will improve how he feels. Insurance change has kept him from his daily exercise at [employer]. CPAP download looks good at pressure of 7. AHI of 8.5 is a little high but all events are central apneas. He is happy with CPAP "can't live without it", nasal pillows. No restless legs and sleeping very well using clonazepam with no apparent morning carryover.  04/27/15- 84 yoM never smoker followed for OSA, hx Cheynes-Stokes, Restless legs, complicated by CM/ AFib/ brady-tachy/ pacer, allergic rhinitis, bronchitis/ multiple granulomas, GERD. Hx right nephrectomy for cancer. CPAP 9/ [facility] FOLLOWS FOR: Wears CPAP every night for about 8 hours; pressure works well for patient-DME is [facility]; no recent DL's.  02/08/2016-82 year old male never smoker followed for OSA, history [name], Restless legs, complicated by CM/A. fib/bradycardia tachycardia/pacer, allergic rhinitis, bronchitis/multiple granulomas, GERD. History right nephrectomy for cancer. CPAP 9/[facility]  Using chinstrap with nasal mask. Hospital in July for SIRS/Right lung pneumonia. Had speech swallowing evaluation. He denies coughing or choking with meals. FOLLOWS FOR: DME: [facility]. Wears CPAP every night and DL attached.  On  Levaquin CPAP download good compliance 96%/4 hour, good control AHI 4.0.  He says he feels "fine" today.  CT chest 01/03/2016 IMPRESSION: Similar cardiomegaly. Small pleural effusions and bronchial wall thickening, which can be seen with pulmonary edema or bronchitis. Patchy ground-glass opacities RIGHT greater than LEFT lungs concerning for pneumonia, alternatively confluent edema. 5 mm RIGHT lower lobe pulmonary nodule stable from August 29, 2014. 8 mm RIGHT upper lobe sub solid pulmonary nodule. Follow-up by chest CT without contrast is recommended in 3 months to confirm persistence. This recommendation follows the consensus statement: Recommendations for the Management of Subsolid Pulmonary Nodules Detected at CT: A Statement from the [place] as published in Radiology 2013; 266:304-317. Electronically Signed   By: Elon Alas M.D.   On: 01/03/2016 19:18 CXR 02/03/2016 IMPRESSION: Aortic atherosclerosis. Increased right basilar opacity concerning for worsening pneumonia or atelectasis. Electronically Signed   By: Marijo Conception, M.D.   On: 02/03/2016 16:19  ROS-see HPI Constitutional:   No-   weight loss, night sweats, fevers, chills, fatigue, lassitude. HEENT:   No-  headaches, difficulty swallowing, tooth/dental problems, sore throat,       No-  sneezing, itching, ear ache, nasal congestion, post nasal drip,  CV:  No-   chest pain, orthopnea, PND, swelling in lower extremities, anasarca, +dizziness, palpitations Resp: + shortness of breath with exertion or at rest.              No-   productive cough,  No non-productive cough,  No- coughing up of blood.              No-   change in color of mucus.  No- wheezing.   Skin: No-   rash or lesions. GI:  No-  heartburn, indigestion, abdominal pain, nausea, vomiting, GU:  MS:  No-   joint pain or swelling.   Neuro-     nothing unusual Psych:  No- change in mood or affect. No depression or anxiety.  No memory  loss.  OBJ- Physical Exam General- Alert, Oriented, Affect-appropriate, Distress- none acute. +thin elderly Skin- rash-none, lesions- none, excoriation- none Lymphadenopathy- none Head- atraumatic            Eyes- Gross vision intact, PERRLA, conjunctivae and secretions clear            Ears- Hearing, canals-normal            Nose- Clear, no-Septal dev, mucus, polyps, erosion, perforation             Throat- Mallampati II , mucosa clear , drainage- none, tonsils- atrophic Neck- flexible , trachea midline, no stridor , thyroid nl, carotid no bruit Chest - symmetrical excursion , unlabored           Heart/CV- RRR , 2/6 SEM AS murmur , no gallop  , no rub, nl s1 s2                           - JVD- none , edema- none, stasis changes- none, varices- none           Lung-+ few large airway rattles, no rales, unlabored, wheeze- none, cough- none , dullness-none, rub- none           Chest wall- +R pacemaker Abd-  Br/ Gen/ Rectal- Not done, not indicated Extrem- cyanosis- none, clubbing, none, atrophy- none, strength- nl Neuro- grossly intact to observation  Assessment & Plan:

## 2016-02-17 ENCOUNTER — Encounter: Payer: Self-pay | Admitting: Internal Medicine

## 2016-02-17 ENCOUNTER — Encounter (INDEPENDENT_AMBULATORY_CARE_PROVIDER_SITE_OTHER): Payer: Self-pay

## 2016-02-17 ENCOUNTER — Ambulatory Visit (INDEPENDENT_AMBULATORY_CARE_PROVIDER_SITE_OTHER): Payer: PPO | Admitting: Internal Medicine

## 2016-02-17 VITALS — BP 134/78 | HR 67 | Ht 70.0 in | Wt 179.4 lb

## 2016-02-17 DIAGNOSIS — I493 Ventricular premature depolarization: Secondary | ICD-10-CM

## 2016-02-17 DIAGNOSIS — I442 Atrioventricular block, complete: Secondary | ICD-10-CM | POA: Diagnosis not present

## 2016-02-17 DIAGNOSIS — Z7901 Long term (current) use of anticoagulants: Secondary | ICD-10-CM

## 2016-02-17 DIAGNOSIS — I4821 Permanent atrial fibrillation: Secondary | ICD-10-CM

## 2016-02-17 DIAGNOSIS — I482 Chronic atrial fibrillation: Secondary | ICD-10-CM | POA: Diagnosis not present

## 2016-02-17 NOTE — Patient Instructions (Signed)
Medication Instructions:  Your physician recommends that you continue on your [place] medications as directed. Please refer to the [place] Medication list given to you today.   Labwork: None ordered   Testing/Procedures: None ordered   Follow-Up: Your physician wants you to follow-up in: 12 months with Dr. Rayann Heman. You will receive a reminder letter in the mail two months in advance. If you don't receive a letter, please call our office to schedule the follow-up appointment.  Remote monitoring is used to monitor your Pacemaker from home. This monitoring reduces the number of office visits required to check your device to one time per year. It allows [place] to keep an eye on the functioning of your device to ensure it is working properly. You are scheduled for a device check from home on 05/18/16. You may send your transmission at any time that day. If you have a wireless device, the transmission will be sent automatically. After your physician reviews your transmission, you will receive a postcard with your next transmission date.    Any Other Special Instructions Will Be Listed Below (If Applicable).     If you need a refill on your cardiac medications before your next appointment, please call your pharmacy.

## 2016-02-18 LAB — CUP PACEART INCLINIC DEVICE CHECK
Battery Remaining Longevity: 57 mo
Battery Voltage: 2.75 V
Date Time Interrogation Session: 20170816190851
Implantable Lead Location: 753860
Implantable Lead Model: 5076
Lead Channel Impedance Value: 0 Ohm
Lead Channel Impedance Value: 465 Ohm
Lead Channel Pacing Threshold Amplitude: 0.75 V
Lead Channel Setting Pacing Pulse Width: 0.4 ms
Lead Channel Setting Sensing Sensitivity: 4 mV
MDC IDC LEAD IMPLANT DT: 20130702
MDC IDC MSMT BATTERY IMPEDANCE: 900 Ohm
MDC IDC MSMT LEADCHNL RV PACING THRESHOLD AMPLITUDE: 0.75 V
MDC IDC MSMT LEADCHNL RV PACING THRESHOLD PULSEWIDTH: 0.4 ms
MDC IDC MSMT LEADCHNL RV PACING THRESHOLD PULSEWIDTH: 0.4 ms
MDC IDC SET LEADCHNL RV PACING AMPLITUDE: 2.5 V
MDC IDC STAT BRADY RV PERCENT PACED: 95 %

## 2016-02-18 NOTE — Progress Notes (Signed)
PCP: Michael Shelling, MD  [name] is a 82 y.o. male who presents today for routine electrophysiology followup.  He has mild dementia.  He has also had troubles with aspiration pneumonia.  He was recently hospitalized for this.  He follows with Michael [name].  Today, he denies symptoms of palpitations, chest pain, shortness of breath,  lower extremity edema, dizziness, presyncope, or syncope.  The patient is otherwise without complaint today.   Past Medical History:  Diagnosis Date  . Allergic rhinitis   . Arthritis   . BPH (benign prostatic hyperplasia)   . Bronchitis   . Complete heart block ([place])    pacemaker originally placed in 1988  . Dementia   . Esophageal reflux   . Hypertension   . Lung nodule    noted on CXR December 2012  . Normal nuclear stress test 2013  . OSA (obstructive sleep apnea)    uses cpap  . Permanent atrial fibrillation ([facility])    on coumadin  . Pneumonia   . Presence of permanent cardiac pacemaker   . Renal insufficiency    Rt kidney removed d/t maglinant tumor  . RLS (restless legs syndrome)   . Stroke [facility])    TIA greater than 20 years  . Transitional cell carcinoma ([place])    s/p nephrectomy in  2010   Past Surgical History:  Procedure Laterality Date  . CARDIOVASCULAR STRESS TEST  03/23/2009   EF 65%, NORMAL  . CATARACT EXTRACTION W/PHACO Left 08/26/2015   Procedure: CATARACT EXTRACTION PHACO AND INTRAOCULAR LENS PLACEMENT (IOC);  Surgeon: Michael Pearson, MD;  Location: [place];  Service: Ophthalmology;  Laterality: Left;  . DOPPLER ECHOCARDIOGRAPHY  03/26/2001   EF 55%  . EYE SURGERY     tumor removed from Rt eye lid  . HERNIA REPAIR     multiple  . MASS EXCISION Left 11/07/2012   Procedure: MINOR EXCISION OF MASS;  Surgeon: Michael Lasso, MD;  Location: [place];  Service: General;  Laterality: Left;  . NEPHRECTOMY  2010   rt  . PACEMAKER INSERTION  1988   most recent generator change by [name] 20067/2/13 with new  right ventricular lead placed  . PERMANENT PACEMAKER GENERATOR CHANGE N/A 01/03/2012   Procedure: PERMANENT PACEMAKER GENERATOR CHANGE;  Surgeon: Michael Grayer, MD;  Location: [facility] CATH LAB;  Service: Cardiovascular;  Laterality: N/A;  . US ECHOCARDIOGRAPHY  04/22/2008   EF 55-60%    [name] Outpatient Prescriptions  Medication Sig Dispense Refill  . acetaminophen (TYLENOL) 325 MG tablet Take 650 mg by mouth daily as needed for mild pain or fever.    Marland Kitchen aspirin 81 MG tablet Take 81 mg by mouth every morning.     Marland Kitchen atenolol (TENORMIN) 25 MG tablet Take 1 tablet (25 mg total) by mouth 2 (two) times daily. 180 tablet 1  . clonazePAM (KLONOPIN) 2 MG tablet Take 2 mg by mouth at bedtime.    . cycloSPORINE (RESTASIS) 0.05 % ophthalmic emulsion Place 1 drop into both eyes 2 (two) times daily.     Marland Kitchen doxazosin (CARDURA) 2 MG tablet Take 2 mg by mouth daily.    . finasteride (PROSCAR) 5 MG tablet Take 5 mg by mouth daily.    . furosemide (LASIX) 40 MG tablet Take 1 tablet by mouth once daily 90 tablet 0  . galantamine (RAZADYNE) 8 MG tablet Take 8 mg by mouth 2 (two) times daily.    Marland Kitchen glucosamine-chondroitin 500-400 MG tablet Take 2  tablets by mouth every evening.    . hydroxypropyl methylcellulose (ISOPTO TEARS) 2.5 % ophthalmic solution Place 1 drop into both eyes 4 (four) times daily as needed for dry eyes.     Marland Kitchen ipratropium (ATROVENT) 0.06 % nasal spray Place 2 sprays into both nostrils 2 (two) times daily.    . Multiple Vitamin (MULTIVITAMIN) capsule Take 1 capsule by mouth daily.      Marland Kitchen OVER THE COUNTER MEDICATION Apply 1 application topically at bedtime. Eye gel    . saccharomyces boulardii (FLORASTOR) 250 MG capsule Take 1 capsule (250 mg total) by mouth 2 (two) times daily. 30 capsule 0  . warfarin (COUMADIN) 5 MG tablet Take 0.5-1 tablets (2.5-5 mg total) by mouth daily at 6 PM. Take 1/2 tablet (2.5mg ) on Mon, Wed, and Fri. Take 1 tablet (5mg ) on Sun, Tues, Thurs, and Sat.     No [place]  facility-administered medications for this visit.     Physical Exam: Vitals:   02/17/16 1555  BP: 134/78  Pulse: 67  SpO2: 97%  Weight: 179 lb 6.4 oz (81.4 kg)  Height: 5\' 10"  (1.778 m)    GEN- The patient is elderly appearing, alert and oriented x 3 today.   Head- normocephalic, atraumatic Eyes-  Sclera clear, conjunctiva pink Ears- hearing intact Oropharynx- clear Lungs- Clear to ausculation bilaterally, normal work of breathing Chest- pacemaker pocket is well healed Heart- Regular rate and rhythm (paced), 2/6 SEM LSB GI- soft, NT, ND, + BS Extremities- no clubbing, cyanosis, or edema MS- he is guarding his R shoulder due to pain  Pacemaker interrogation- reviewed in detail today,  See PACEART report  Assessment and Plan:  1. Complete heart block Normal pacemaker function See Pace Art report No changes today  2. Permanent Afib Continue long term anticoagulation with coumadin He had prior TIA when only on coumadin.  He was seen by neurology who recommended both ASA and coumadin.  I will therefore make no changes to his regimen.  3. NSVT/ PVCs- no changes today  4. Abnormal echo Recent echo reveals a echodensity of uncertainty within the posterior pericardial space.  Though an MRI was advised, he cannot have an MRI with his Gimpel pacemaker.  I did discuss this finding with the patient and his wife today.  They are clear that they do not wish to evaluate this further at this time.  They prefer a more conservative approach.  We will therefore follow clinically.  Return in 12 months carelink  Michael Rinks Keefer Soulliere,MD

## 2016-02-19 DIAGNOSIS — J690 Pneumonitis due to inhalation of food and vomit: Secondary | ICD-10-CM | POA: Diagnosis not present

## 2016-02-21 DIAGNOSIS — R911 Solitary pulmonary nodule: Secondary | ICD-10-CM | POA: Insufficient documentation

## 2016-02-21 NOTE — Assessment & Plan Note (Addendum)
Resolving right lung pneumonia. Plan-continue reflux precautions and finished Levaquin

## 2016-02-21 NOTE — Assessment & Plan Note (Signed)
Regular rhythm on exam with pacemaker

## 2016-02-21 NOTE — Assessment & Plan Note (Signed)
Speech swallowing evaluation in July hospitalization

## 2016-02-21 NOTE — Assessment & Plan Note (Signed)
Not clear if groundglass lesion is residual from pneumonia or new process. Plan-noncontrast CT chest scheduled October 2017

## 2016-02-21 NOTE — Assessment & Plan Note (Signed)
CPAP 9/Advanced with good control and compliance. He is comfortable with present set up.

## 2016-02-23 ENCOUNTER — Ambulatory Visit (INDEPENDENT_AMBULATORY_CARE_PROVIDER_SITE_OTHER): Payer: PPO | Admitting: *Deleted

## 2016-02-23 DIAGNOSIS — Z5181 Encounter for therapeutic drug level monitoring: Secondary | ICD-10-CM | POA: Diagnosis not present

## 2016-02-23 DIAGNOSIS — I4891 Unspecified atrial fibrillation: Secondary | ICD-10-CM

## 2016-02-23 DIAGNOSIS — Z7901 Long term (current) use of anticoagulants: Secondary | ICD-10-CM | POA: Diagnosis not present

## 2016-02-23 LAB — POCT INR: INR: 2.5

## 2016-03-08 ENCOUNTER — Other Ambulatory Visit: Payer: Self-pay | Admitting: Internal Medicine

## 2016-03-08 NOTE — Telephone Encounter (Signed)
Patients wife calling to get a 90 day prescription refill on Clonazepam sent to [facility].  Patient was using mail order pharmacy before, but is now getting this from [facility] instead.  Patient is completely out of medication but wife says that this message can wait until Dr. Annamaria Boots returns on Thursday.  She said that she would rather the medication come from Dr. Annamaria Boots instead of another provider.    Dr. Annamaria Boots, ok to refill for 90 day supply?  [facility] Prescriptions on File Prior to Visit  Medication Sig Dispense Refill  . acetaminophen (TYLENOL) 325 MG tablet Take 650 mg by mouth daily as needed for mild pain or fever.    Marland Kitchen aspirin 81 MG tablet Take 81 mg by mouth every morning.     Marland Kitchen atenolol (TENORMIN) 25 MG tablet Take 1 tablet (25 mg total) by mouth 2 (two) times daily. 180 tablet 1  . clonazePAM (KLONOPIN) 2 MG tablet Take 2 mg by mouth at bedtime.    . cycloSPORINE (RESTASIS) 0.05 % ophthalmic emulsion Place 1 drop into both eyes 2 (two) times daily.     Marland Kitchen doxazosin (CARDURA) 2 MG tablet Take 2 mg by mouth daily.    . finasteride (PROSCAR) 5 MG tablet Take 5 mg by mouth daily.    . furosemide (LASIX) 40 MG tablet Take 1 tablet by mouth once daily 90 tablet 0  . galantamine (RAZADYNE) 8 MG tablet Take 8 mg by mouth 2 (two) times daily.    Marland Kitchen glucosamine-chondroitin 500-400 MG tablet Take 2 tablets by mouth every evening.    . hydroxypropyl methylcellulose (ISOPTO TEARS) 2.5 % ophthalmic solution Place 1 drop into both eyes 4 (four) times daily as needed for dry eyes.     Marland Kitchen ipratropium (ATROVENT) 0.06 % nasal spray Place 2 sprays into both nostrils 2 (two) times daily.    . Multiple Vitamin (MULTIVITAMIN) capsule Take 1 capsule by mouth daily.      Marland Kitchen OVER THE COUNTER MEDICATION Apply 1 application topically at bedtime. Eye gel    . saccharomyces boulardii (FLORASTOR) 250 MG capsule Take 1 capsule (250 mg total) by mouth 2 (two) times daily. 30 capsule 0  . warfarin (COUMADIN) 5 MG  tablet Take 0.5-1 tablets (2.5-5 mg total) by mouth daily at 6 PM. Take 1/2 tablet (2.5mg ) on Mon, Wed, and Fri. Take 1 tablet (5mg ) on Sun, Tues, Thurs, and Sat.     No [place] facility-administered medications on file prior to visit.    No Known Allergies

## 2016-03-10 MED ORDER — CLONAZEPAM 2 MG PO TABS: 2.0000 mg | ORAL_TABLET | Freq: Every day | ORAL | 0 refills | Status: DC

## 2016-03-10 NOTE — Telephone Encounter (Signed)
Rx has been called in per CY. Pt's wife is aware. Nothing further was needed.

## 2016-03-10 NOTE — Telephone Encounter (Signed)
Ok to refill as a 90 day script

## 2016-03-14 ENCOUNTER — Other Ambulatory Visit: Payer: Self-pay | Admitting: *Deleted

## 2016-03-14 MED ORDER — WARFARIN SODIUM 5 MG PO TABS: ORAL_TABLET | ORAL | 3 refills | Status: DC

## 2016-03-21 ENCOUNTER — Ambulatory Visit (INDEPENDENT_AMBULATORY_CARE_PROVIDER_SITE_OTHER)
Admission: RE | Admit: 2016-03-21 | Discharge: 2016-03-21 | Disposition: A | Discharge status: Home / Self Care | Payer: PPO | Source: Ambulatory Visit | Attending: Adult Health | Admitting: Adult Health

## 2016-03-21 ENCOUNTER — Encounter: Payer: Self-pay | Admitting: Adult Health

## 2016-03-21 ENCOUNTER — Ambulatory Visit (INDEPENDENT_AMBULATORY_CARE_PROVIDER_SITE_OTHER): Payer: PPO | Admitting: Adult Health

## 2016-03-21 ENCOUNTER — Telehealth: Payer: Self-pay | Admitting: Internal Medicine

## 2016-03-21 ENCOUNTER — Encounter (INDEPENDENT_AMBULATORY_CARE_PROVIDER_SITE_OTHER): Payer: Self-pay

## 2016-03-21 ENCOUNTER — Ambulatory Visit (INDEPENDENT_AMBULATORY_CARE_PROVIDER_SITE_OTHER): Payer: PPO | Admitting: Pharmacist

## 2016-03-21 VITALS — BP 120/80 | HR 75 | Temp 98.2°F | Ht 70.0 in | Wt 182.0 lb

## 2016-03-21 DIAGNOSIS — Z7901 Long term (current) use of anticoagulants: Secondary | ICD-10-CM | POA: Diagnosis not present

## 2016-03-21 DIAGNOSIS — J189 Pneumonia, unspecified organism: Secondary | ICD-10-CM

## 2016-03-21 DIAGNOSIS — I4891 Unspecified atrial fibrillation: Secondary | ICD-10-CM | POA: Diagnosis not present

## 2016-03-21 DIAGNOSIS — R911 Solitary pulmonary nodule: Secondary | ICD-10-CM | POA: Diagnosis not present

## 2016-03-21 DIAGNOSIS — R05 Cough: Secondary | ICD-10-CM

## 2016-03-21 DIAGNOSIS — R059 Cough, unspecified: Secondary | ICD-10-CM

## 2016-03-21 DIAGNOSIS — J40 Bronchitis, not specified as acute or chronic: Secondary | ICD-10-CM | POA: Diagnosis not present

## 2016-03-21 DIAGNOSIS — Z5181 Encounter for therapeutic drug level monitoring: Secondary | ICD-10-CM

## 2016-03-21 DIAGNOSIS — R0602 Shortness of breath: Secondary | ICD-10-CM | POA: Diagnosis not present

## 2016-03-21 LAB — POCT INR: INR: 1.6

## 2016-03-21 NOTE — Patient Instructions (Addendum)
Chest xray today . Once it is back we will decide on antibiotic.  Follow up CT chest next month as planned.  Mucinex DM Twice daily  As needed  Cough/congestion  Continue with swallow exercises .  GERD measures.  follow up Dr. Annamaria Boots  As planned next month and As needed   Please contact office for sooner follow up if symptoms do not improve or worsen or seek emergency care

## 2016-03-21 NOTE — Telephone Encounter (Signed)
Spoke with pt's wife. Pt possibly has PNA. They want to see CY today but advised them that he doesn't have any openings. Pt has been scheduled to see TP today at 3:30pm. Nothing further was needed.

## 2016-03-21 NOTE — Assessment & Plan Note (Signed)
?   Early flare with cough  Check cxr to r/o PNA   Plan  Patient Instructions  Chest xray today . Once it is back we will decide on antibiotic.  Follow up CT chest next month as planned.  Mucinex DM Twice daily  As needed  Cough/congestion  Continue with swallow exercises .  GERD measures.  follow up Dr. Annamaria Boots  As planned next month and As needed   Please contact office for sooner follow up if symptoms do not improve or worsen or seek emergency care

## 2016-03-21 NOTE — Assessment & Plan Note (Signed)
Follow up CT chest next month

## 2016-03-21 NOTE — Progress Notes (Signed)
Subjective:    Patient ID: [name], male    DOB: 1930/06/05, 82 y.o.   MRN: [id number]  HPI 82 year old male never smoker followed for obstructive sleep apnea, restless leg syndrome, allergic rhinitis and bronchitis. History of A. fib, bradycardia status post pacemaker. History of right nephrectomy for cancer  03/21/2016 an acute office visit Patient presented for an acute office visit. Wanes of 2 days of minimally productive cough. He denies any fever, discolored mucus, chest pain, orthopnea, PND, or increased leg swelling. Patient says he's had 2 recent episodes of pneumonia. Most recent in July of this year where he had a right-sided pneumonia. He did have a speech evaluation was set up for swallow exercises. He says he does not feel bad, but 1 to make sure his cough is not gone. Return into a pneumonia. He does have some nodules on CT chest and has an upcoming chest CT next month for serial follow-up.   Past Medical History:  Diagnosis Date  . Allergic rhinitis   . Arthritis   . BPH (benign prostatic hyperplasia)   . Bronchitis   . Complete heart block ([place])    pacemaker originally placed in 1988  . Dementia   . Esophageal reflux   . Hypertension   . Lung nodule    noted on CXR December 2012  . Normal nuclear stress test 2013  . OSA (obstructive sleep apnea)    uses cpap  . Permanent atrial fibrillation (H[facility])    on coumadin  . Pneumonia   . Presence of permanent cardiac pacemaker   . Renal insufficiency    Rt kidney removed d/t maglinant tumor  . RLS (restless legs syndrome)   . Stroke [facility])    TIA greater than 20 years  . Transitional cell carcinoma ([place])    s/p nephrectomy in  2010   [facility] Prescriptions on File Prior to Visit  Medication Sig Dispense Refill  . acetaminophen (TYLENOL) 325 MG tablet Take 650 mg by mouth daily as needed for mild pain or fever.    Marland Kitchen aspirin 81 MG tablet Take 81 mg by mouth every morning.     Marland Kitchen atenolol (TENORMIN)  25 MG tablet Take 1 tablet (25 mg total) by mouth 2 (two) times daily. 180 tablet 1  . clonazePAM (KLONOPIN) 2 MG tablet Take 1 tablet (2 mg total) by mouth at bedtime. 90 tablet 0  . cycloSPORINE (RESTASIS) 0.05 % ophthalmic emulsion Place 1 drop into both eyes 2 (two) times daily.     Marland Kitchen doxazosin (CARDURA) 2 MG tablet Take 2 mg by mouth daily.    . finasteride (PROSCAR) 5 MG tablet Take 5 mg by mouth daily.    . furosemide (LASIX) 40 MG tablet Take 1 tablet by mouth once daily 90 tablet 0  . galantamine (RAZADYNE) 8 MG tablet Take 8 mg by mouth 2 (two) times daily.    Marland Kitchen glucosamine-chondroitin 500-400 MG tablet Take 2 tablets by mouth every evening.    . hydroxypropyl methylcellulose (ISOPTO TEARS) 2.5 % ophthalmic solution Place 1 drop into both eyes 4 (four) times daily as needed for dry eyes.     Marland Kitchen ipratropium (ATROVENT) 0.06 % nasal spray Place 2 sprays into both nostrils 2 (two) times daily.    . Multiple Vitamin (MULTIVITAMIN) capsule Take 1 capsule by mouth daily.      Marland Kitchen OVER THE COUNTER MEDICATION Apply 1 application topically at bedtime. Eye gel    . saccharomyces boulardii (FLORASTOR) 250  MG capsule Take 1 capsule (250 mg total) by mouth 2 (two) times daily. 30 capsule 0  . warfarin (COUMADIN) 5 MG tablet Take as directed by coumadin clinic 30 tablet 3   No [facility] facility-administered medications on file prior to visit.      Review of Systems Constitutional:   No  weight loss, night sweats,  Fevers, chills, fatigue, or  lassitude.  HEENT:   No headaches,  Difficulty swallowing,  Tooth/dental problems, or  Sore throat,                No sneezing, itching, ear ache, nasal congestion, post nasal drip,   CV:  No chest pain,  Orthopnea, PND, swelling in lower extremities, anasarca, dizziness, palpitations, syncope.   GI  No heartburn, indigestion, abdominal pain, nausea, vomiting, diarrhea, change in bowel habits, loss of appetite, bloody stools.   Resp:    No chest wall  deformity  Skin: no rash or lesions.  GU: no dysuria, change in color of urine, no urgency or frequency.  No flank pain, no hematuria   MS:  No joint pain or swelling.  No decreased range of motion.  No back pain.  Psych:  No change in mood or affect. No depression or anxiety.  No memory loss.         Objective:   Physical Exam Vitals:   03/21/16 1550  BP: 120/80  Pulse: 75  Temp: 98.2 F (36.8 C)  TempSrc: Oral  SpO2: 98%  Weight: 182 lb (82.6 kg)  Height: 5\' 10"  (1.778 m)     GEN: A/Ox3; pleasant , NAD, elderly    HEENT:  [place]/AT,  EACs-clear, TMs-wnl, NOSE-clear, THROAT-clear, no lesions, no postnasal drip or exudate noted.   NECK:  Supple w/ fair ROM; no JVD; normal carotid impulses w/o bruits; no thyromegaly or nodules palpated; no lymphadenopathy.    RESP  Few trace rhonchi  no accessory muscle use, no dullness to percussion  CARD:  RRR, no m/r/g  , 1+ peripheral edema, pulses intact, no cyanosis or clubbing.  GI:   Soft & nt; nml bowel sounds; no organomegaly or masses detected.   Musco: Warm bil, no deformities or joint swelling noted.   Neuro: alert, no focal deficits noted.    Skin: Warm, no lesions or rashes  Tammy Parrett NP-C  [facility]  03/21/2016      Assessment & Plan:

## 2016-03-21 NOTE — Addendum Note (Signed)
Addended by: Lorane Gell on: 03/21/2016 04:21 PM   Modules accepted: Orders

## 2016-03-22 ENCOUNTER — Other Ambulatory Visit: Payer: Self-pay | Admitting: *Deleted

## 2016-03-22 ENCOUNTER — Telehealth: Payer: Self-pay | Admitting: Adult Health

## 2016-03-22 MED ORDER — WARFARIN SODIUM 5 MG PO TABS: ORAL_TABLET | ORAL | 1 refills | Status: DC

## 2016-03-22 NOTE — Telephone Encounter (Signed)
Michael Needles, NP  Katherine Roan Cox, CMA        CXR shows previous PNA is resolving  Cont to watch cough , if worsens or not improving will need to call back .  Can call in some abx.  Cont w/ GERD tx  Please contact office for sooner follow up if symptoms do not improve or worsen or seek emergency care   ---  [facility]

## 2016-03-23 MED ORDER — AZITHROMYCIN 250 MG PO TABS: ORAL_TABLET | ORAL | 0 refills | Status: AC

## 2016-03-23 NOTE — Telephone Encounter (Signed)
Patient wife called states that pt is coughing up brown and yellow mucous and would like an antibiotic called into [facility] on [address] - wife Lysbeth Galas) can be reached at [phone]

## 2016-03-23 NOTE — Telephone Encounter (Signed)
Spoke with pt's wife. She is aware of PM's recommendations. Rx has been sent in. Nothing further was needed at this time.

## 2016-03-23 NOTE — Telephone Encounter (Signed)
CXR from earlier this week shows resolving pneumonia. However as symptoms are worse we will treat with Z pack. Please call in 500 mg on day 1 and then 250 mg every day for total 5 days.  Suspicion for recurrent aspiration. As per recent swallow eval  In July 2017 the reccs are to   RegularDiet;Thin liquid  Liquid Administration via: Cup Medication Administration: Whole meds with liquid Supervision: Patient able to self feed Compensations: Small sips/bites;Slow rate;Follow solids with liquid Postural Changes: Remain upright for at least 30 minutes after po intake;Seated upright at 90 degrees.  Please remind the patient to follow these instructions.  Marshell Garfinkel MD [facility] Pager [phone] If no answer or after 3pm call: [phone] 03/23/2016, 9:57 AM

## 2016-03-23 NOTE — Telephone Encounter (Signed)
Called and spoke with pts daughter and she stated that the pts cough is more congested and he is coughing more.    She stated that the pt was not given exercises to do at home from PT for the swallowing.  Are there any exercises you can recommend for the pt do try at home?  TP please advise. thanks

## 2016-03-23 NOTE — Telephone Encounter (Signed)
Spoke with pt's wife, Lysbeth Galas. States that once the pt got up this AM, she discovered that the pt is now coughing up brown and yellow mucus. They would like to have antibiotic sent in. They also need to know if there are any swallowing exercises that the pt can do at home.  PM - please advise as TP is not here today. Thanks.

## 2016-03-25 ENCOUNTER — Telehealth: Payer: Self-pay | Admitting: Adult Health

## 2016-03-25 MED ORDER — AMOXICILLIN-POT CLAVULANATE 875-125 MG PO TABS: 1.0000 | ORAL_TABLET | Freq: Two times a day (BID) | ORAL | 0 refills | Status: DC

## 2016-03-25 NOTE — Telephone Encounter (Signed)
Offer augmentin 875 mg, # 14   1 twice daily 

## 2016-03-25 NOTE — Telephone Encounter (Signed)
Called spoke with pt. Aware of recs. rx's sent in. Nothing further needed 

## 2016-03-25 NOTE — Telephone Encounter (Signed)
Per 03/21/16 OV w/ TP: Chest xray today . Once it is back we will decide on antibiotic.  Follow up CT chest next month as planned.  Mucinex DM Twice daily  As needed  Cough/congestion  Continue with swallow exercises .  GERD measures.  follow up Dr. Annamaria Boots  As planned next month and As needed   Please contact office for sooner follow up if symptoms do not improve or worsen or seek emergency care  ---  Called spoke with pt spouse. Pt is coughing a lot more (brown mucus).  Pt was rx'd zpak 03/22/16 and has finished this. Pt taking mucinex q 12 hrs. Spouse feels pt is getting worse. Requesting further recs. Please advise Dr. Annamaria Boots thanks  No Known Allergies   [facility] Prescriptions on File Prior to Visit  Medication Sig Dispense Refill  . acetaminophen (TYLENOL) 325 MG tablet Take 650 mg by mouth daily as needed for mild pain or fever.    Marland Kitchen aspirin 81 MG tablet Take 81 mg by mouth every morning.     Marland Kitchen atenolol (TENORMIN) 25 MG tablet Take 1 tablet (25 mg total) by mouth 2 (two) times daily. 180 tablet 1  . azithromycin (ZITHROMAX Z-PAK) 250 MG tablet Take 2 tablets (500 mg) on  Day 1,  followed by 1 tablet (250 mg) once daily on Days 2 through 5. 6 each 0  . clonazePAM (KLONOPIN) 2 MG tablet Take 1 tablet (2 mg total) by mouth at bedtime. 90 tablet 0  . cycloSPORINE (RESTASIS) 0.05 % ophthalmic emulsion Place 1 drop into both eyes 2 (two) times daily.     Marland Kitchen doxazosin (CARDURA) 2 MG tablet Take 2 mg by mouth daily.    . finasteride (PROSCAR) 5 MG tablet Take 5 mg by mouth daily.    . furosemide (LASIX) 40 MG tablet Take 1 tablet by mouth once daily 90 tablet 0  . galantamine (RAZADYNE) 8 MG tablet Take 8 mg by mouth 2 (two) times daily.    Marland Kitchen glucosamine-chondroitin 500-400 MG tablet Take 2 tablets by mouth every evening.    . hydroxypropyl methylcellulose (ISOPTO TEARS) 2.5 % ophthalmic solution Place 1 drop into both eyes 4 (four) times daily as needed for dry eyes.     Marland Kitchen  ipratropium (ATROVENT) 0.06 % nasal spray Place 2 sprays into both nostrils 2 (two) times daily.    . Multiple Vitamin (MULTIVITAMIN) capsule Take 1 capsule by mouth daily.      Marland Kitchen OVER THE COUNTER MEDICATION Apply 1 application topically at bedtime. Eye gel    . saccharomyces boulardii (FLORASTOR) 250 MG capsule Take 1 capsule (250 mg total) by mouth 2 (two) times daily. 30 capsule 0  . warfarin (COUMADIN) 5 MG tablet Take as directed by coumadin clinic 90 tablet 1   No [facility] facility-administered medications on file prior to visit.

## 2016-03-30 ENCOUNTER — Telehealth: Payer: Self-pay | Admitting: Internal Medicine

## 2016-03-30 DIAGNOSIS — R05 Cough: Secondary | ICD-10-CM | POA: Diagnosis not present

## 2016-03-30 NOTE — Telephone Encounter (Signed)
I think its time to take him to ER for evaluation and lab work and possible admission, before we get too close to the weekend.

## 2016-03-30 NOTE — Telephone Encounter (Signed)
Attempted to contact patient, left message for patient to return call.

## 2016-03-30 NOTE — Telephone Encounter (Signed)
Pt spouse called back. Reports he has been on ABX x 4 days. Pt is not any better. C/o prod cough (light brown w/ ting blood), very weak. Pt coughs more with trying to talk. Pt is taking mucinex DM and it does not seem to be helping.  Please advise Dr. Annamaria Boots thanks

## 2016-03-30 NOTE — Telephone Encounter (Signed)
Called spoke with pt spouse. She is aware of recs below. She will discuss at next visit with Dr. Annamaria Boots about getting patient established with another physician. Nothing further needed

## 2016-04-04 ENCOUNTER — Ambulatory Visit (INDEPENDENT_AMBULATORY_CARE_PROVIDER_SITE_OTHER): Payer: PPO | Admitting: *Deleted

## 2016-04-04 DIAGNOSIS — Z5181 Encounter for therapeutic drug level monitoring: Secondary | ICD-10-CM | POA: Diagnosis not present

## 2016-04-04 DIAGNOSIS — Z7901 Long term (current) use of anticoagulants: Secondary | ICD-10-CM

## 2016-04-04 DIAGNOSIS — I4891 Unspecified atrial fibrillation: Secondary | ICD-10-CM | POA: Diagnosis not present

## 2016-04-04 LAB — POCT INR: INR: 3.9

## 2016-04-05 ENCOUNTER — Ambulatory Visit (INDEPENDENT_AMBULATORY_CARE_PROVIDER_SITE_OTHER)
Admission: RE | Admit: 2016-04-05 | Discharge: 2016-04-05 | Disposition: A | Discharge status: Home / Self Care | Payer: PPO | Source: Ambulatory Visit | Attending: Internal Medicine | Admitting: Internal Medicine

## 2016-04-05 DIAGNOSIS — R911 Solitary pulmonary nodule: Secondary | ICD-10-CM

## 2016-04-19 ENCOUNTER — Ambulatory Visit (INDEPENDENT_AMBULATORY_CARE_PROVIDER_SITE_OTHER): Payer: PPO | Admitting: Pharmacist Clinician (PhC)/ Clinical Pharmacy Specialist

## 2016-04-19 DIAGNOSIS — Z5181 Encounter for therapeutic drug level monitoring: Secondary | ICD-10-CM

## 2016-04-19 DIAGNOSIS — Z7901 Long term (current) use of anticoagulants: Secondary | ICD-10-CM | POA: Diagnosis not present

## 2016-04-19 DIAGNOSIS — I4891 Unspecified atrial fibrillation: Secondary | ICD-10-CM | POA: Diagnosis not present

## 2016-04-19 LAB — POCT INR: INR: 2.1

## 2016-04-27 ENCOUNTER — Encounter: Payer: Self-pay | Admitting: Internal Medicine

## 2016-04-28 ENCOUNTER — Encounter: Payer: Self-pay | Admitting: Internal Medicine

## 2016-04-28 ENCOUNTER — Ambulatory Visit (INDEPENDENT_AMBULATORY_CARE_PROVIDER_SITE_OTHER): Payer: PPO | Admitting: Internal Medicine

## 2016-04-28 DIAGNOSIS — K219 Gastro-esophageal reflux disease without esophagitis: Secondary | ICD-10-CM | POA: Diagnosis not present

## 2016-04-28 DIAGNOSIS — R911 Solitary pulmonary nodule: Secondary | ICD-10-CM | POA: Diagnosis not present

## 2016-04-28 DIAGNOSIS — G4733 Obstructive sleep apnea (adult) (pediatric): Secondary | ICD-10-CM | POA: Diagnosis not present

## 2016-04-28 DIAGNOSIS — Z23 Encounter for immunization: Secondary | ICD-10-CM

## 2016-04-28 MED ORDER — TETANUS-DIPHTH-ACELL PERTUSSIS 5-2.5-18.5 LF-MCG/0.5 IM SUSP: 0.5000 mL | Freq: Once | INTRAMUSCULAR | Status: DC

## 2016-04-28 NOTE — Patient Instructions (Addendum)
Flu vax- senior  TDAP  We can continue CPAP 9, mask of choice, humidifier, supplies, [facility]/ [facility]    Dx OSA  Please call if we can help

## 2016-04-28 NOTE — Assessment & Plan Note (Signed)
Follow-up CT not necessary at this point. Occasional chest x-ray will be done as part of necessary healthcare over time.

## 2016-04-28 NOTE — Assessment & Plan Note (Signed)
Again emphasized reflux control measures

## 2016-04-28 NOTE — Progress Notes (Signed)
Subjective:    Patient ID: [name], male    DOB: 1930-01-21, 82 y.o.   MRN: [id number]  HPI M never smoker followed for OSA, hx Cheynes-[name], Restless legs, complicated by CM/ AFib/ brady-tachy/ pacer, allergic rhinitis, bronchitis, GERD. Hx right nephrectomy for cancer, GERD    02/08/2016-82 year old male never smoker followed for OSA, history [name], Restless legs, complicated by CM/A. fib/bradycardia tachycardia/pacer, allergic rhinitis, bronchitis/multiple granulomas, GERD. History right nephrectomy for cancer. CPAP 9/[facility]  Using chinstrap with nasal mask. Hospital in July for SIRS/Right lung pneumonia. Had speech swallowing evaluation. He denies coughing or choking with meals. FOLLOWS FOR: DME: [facility]. Wears CPAP every night and DL attached.  On Levaquin CPAP download good compliance 96%/4 hour, good control AHI 4.0.  He says he feels "fine" today.  CT chest 01/03/2016 IMPRESSION: Similar cardiomegaly. Small pleural effusions and bronchial wall thickening, which can be seen with pulmonary edema or bronchitis. Patchy ground-glass opacities RIGHT greater than LEFT lungs concerning for pneumonia, alternatively confluent edema. 5 mm RIGHT lower lobe pulmonary nodule stable from August 29, 2014. 8 mm RIGHT upper lobe sub solid pulmonary nodule. Follow-up by chest CT without contrast is recommended in 3 months to confirm persistence. This recommendation follows the consensus statement: Recommendations for the Management of Subsolid Pulmonary Nodules Detected at CT: A Statement from the [place] as published in Radiology 2013; 266:304-317. Electronically Signed   By: [name] M.D.   On: 01/03/2016 19:18 CXR 02/03/2016 IMPRESSION: Aortic atherosclerosis. Increased right basilar opacity concerning for worsening pneumonia or atelectasis. Electronically Signed   By: [name], M.D.   On: 02/03/2016 16:19  04/28/2016-82 year old male never  smoker followed for OSA, allergic rhinitis, bronchitis,multiple granulomas history Cheyne [name], Restless legs, complicated by CM/A. fib/bradycardia tachycardia/pacer, , GERD. History right nephrectomy for cancer. CPAP 9/[facility]  Using chinstrap with nasal mask LOV- NP 09/22/ 2017- bronchitis/ GERD. CXR FOLLOWS FOR: DME  [facility]. Pt wears CPAP every night for about 8 hours;pressure works well for pt; and would like to discuss getting supplies. Has had bronchitis and pneumonia this year attributed to aspiration. Sleeping on a wedge to minimize reflux. We discussed acid blockers and the swallowing instructions he is been given. Today feels chest is clear and normal, no longer coughing. CT reviewed. He requested TDAP vaccine- [name] explained this might not be covered due to lack of acute injury and he accepted responsibility if not covered. Doing very well with CPAP. Sleeping through the night and feeling well rested. Download confirms excellent compliance 100%/4 hour and excellent control AHI 2.9/hour with CPAP 9. CT chest 04/05/2016 IMPRESSION: 1. The 7 mm sub solid pulmonary nodule abutting the fissure on the prior exam has resolved and requires no other follow up. 2. Multiple other nodules in calcified granulomas are stable from 2013 and require no further follow up. 3. There is some scattered ground-glass opacities favoring the lung bases, these probably reflect low grade edema and for the most part are improved from prior. 4. Coronary, aortic arch, and branch vessel atherosclerotic vascular disease. Mild to moderate cardiomegaly with small pericardial effusion. 5. Small left pleural effusion. Electronically Signed   By: [name] M.D.   On: 04/05/2016 16:12  ROS-see HPI Constitutional:   No-   weight loss, night sweats, fevers, chills, fatigue, lassitude. HEENT:   No-  headaches, difficulty swallowing, tooth/dental problems, sore throat,       No-  sneezing, itching, ear ache, nasal  congestion, post nasal drip,  CV:  No-   chest pain, orthopnea, PND, swelling in lower extremities, anasarca, +dizziness, palpitations Resp: + shortness of breath with exertion or at rest.              No-   productive cough,  No non-productive cough,  No- coughing up of blood.              No-   change in color of mucus.  No- wheezing.   Skin: No-   rash or lesions. GI:  No-   heartburn, indigestion, abdominal pain, nausea, vomiting, GU:  MS:  No-   joint pain or swelling.   Neuro-     nothing unusual Psych:  No- change in mood or affect. No depression or anxiety.  No memory loss.  OBJ- Physical Exam General- Alert, Oriented, Affect-appropriate, Distress- none acute. +thin elderly Skin- rash-none, lesions- none, excoriation- none Lymphadenopathy- none Head- atraumatic            Eyes- Gross vision intact, PERRLA, conjunctivae and secretions clear            Ears- Hearing, canals-normal            Nose- Clear, no-Septal dev, mucus, polyps, erosion, perforation             Throat- Mallampati II , mucosa clear , drainage- none, tonsils- atrophic Neck- flexible , trachea midline, no stridor , thyroid nl, carotid no bruit Chest - symmetrical excursion , unlabored           Heart/CV- RRR , 2-3/6 SEM AS murmur , no gallop  , no rub, nl s1 s2                           - JVD- none , edema-+ 2/ support hose, stasis changes- , varices- none           Lung-clear, no rales, unlabored, wheeze- none, cough- none , dullness-none, rub- none           Chest wall- +R pacemaker Abd-  Br/ Gen/ Rectal- Not done, not indicated Extrem- cyanosis- none, clubbing, none, atrophy- none, strength- nl Neuro- grossly intact to observation  Assessment & Plan:

## 2016-04-28 NOTE — Assessment & Plan Note (Signed)
He continues excellent compliance and control confirmed by his wife and by download report available today. We discussed comfort measures. No changes required.

## 2016-05-10 ENCOUNTER — Ambulatory Visit: Payer: PPO | Admitting: Internal Medicine

## 2016-05-17 ENCOUNTER — Ambulatory Visit (INDEPENDENT_AMBULATORY_CARE_PROVIDER_SITE_OTHER): Payer: PPO | Admitting: Pharmacist

## 2016-05-17 DIAGNOSIS — Z7901 Long term (current) use of anticoagulants: Secondary | ICD-10-CM | POA: Diagnosis not present

## 2016-05-17 DIAGNOSIS — I4891 Unspecified atrial fibrillation: Secondary | ICD-10-CM

## 2016-05-17 DIAGNOSIS — Z5181 Encounter for therapeutic drug level monitoring: Secondary | ICD-10-CM | POA: Diagnosis not present

## 2016-05-17 LAB — POCT INR: INR: 2.4

## 2016-05-18 ENCOUNTER — Ambulatory Visit (INDEPENDENT_AMBULATORY_CARE_PROVIDER_SITE_OTHER): Payer: PPO | Admitting: *Deleted

## 2016-05-18 ENCOUNTER — Telehealth: Payer: Self-pay | Admitting: Cardiology

## 2016-05-18 DIAGNOSIS — I442 Atrioventricular block, complete: Secondary | ICD-10-CM | POA: Diagnosis not present

## 2016-05-18 NOTE — Telephone Encounter (Signed)
LMOVM reminding pt to send remote transmission.   

## 2016-05-18 NOTE — Progress Notes (Signed)
Remote pacemaker transmission.   

## 2016-05-25 ENCOUNTER — Encounter: Payer: Self-pay | Admitting: Cardiology

## 2016-05-28 LAB — CUP PACEART REMOTE DEVICE CHECK
Battery Impedance: 1085 Ohm
Battery Voltage: 2.74 V
Implantable Lead Implant Date: 20130702
Implantable Lead Location: 753860
Implantable Pulse Generator Implant Date: 20130702
Lead Channel Impedance Value: 453 Ohm
Lead Channel Pacing Threshold Pulse Width: 0.4 ms
Lead Channel Setting Sensing Sensitivity: 4 mV
MDC IDC MSMT BATTERY REMAINING LONGEVITY: 51 mo
MDC IDC MSMT LEADCHNL RA IMPEDANCE VALUE: 0 Ohm
MDC IDC MSMT LEADCHNL RV PACING THRESHOLD AMPLITUDE: 0.625 V
MDC IDC SESS DTM: 20171115174037
MDC IDC SET LEADCHNL RV PACING AMPLITUDE: 2.5 V
MDC IDC SET LEADCHNL RV PACING PULSEWIDTH: 0.4 ms
MDC IDC STAT BRADY RV PERCENT PACED: 97 %

## 2016-06-02 ENCOUNTER — Other Ambulatory Visit: Payer: Self-pay | Admitting: Internal Medicine

## 2016-06-12 ENCOUNTER — Other Ambulatory Visit: Payer: Self-pay | Admitting: Internal Medicine

## 2016-06-13 ENCOUNTER — Other Ambulatory Visit: Payer: Self-pay | Admitting: Internal Medicine

## 2016-06-13 MED ORDER — CLONAZEPAM 2 MG PO TABS: 2.0000 mg | ORAL_TABLET | Freq: Every day | ORAL | 1 refills | Status: DC

## 2016-06-13 NOTE — Telephone Encounter (Signed)
[facility] pharmacy called about Clonazepam being filled for the patient.Mearl Latin

## 2016-06-13 NOTE — Telephone Encounter (Signed)
Patient is requesting a refill doxazosin (CARDURA) 2 MG tablet. [facility].

## 2016-06-13 NOTE — Telephone Encounter (Signed)
Please advise if okay to refill this medication.  Last filled 03/10/16 #90 Recall in computer for 04/2017  Refill okayed per Katie, x #1 refills.  Nothing further needed.

## 2016-06-16 DIAGNOSIS — G4733 Obstructive sleep apnea (adult) (pediatric): Secondary | ICD-10-CM | POA: Diagnosis not present

## 2016-06-20 ENCOUNTER — Ambulatory Visit (INDEPENDENT_AMBULATORY_CARE_PROVIDER_SITE_OTHER): Payer: PPO | Admitting: *Deleted

## 2016-06-20 DIAGNOSIS — Z5181 Encounter for therapeutic drug level monitoring: Secondary | ICD-10-CM

## 2016-06-20 DIAGNOSIS — I4891 Unspecified atrial fibrillation: Secondary | ICD-10-CM

## 2016-06-20 DIAGNOSIS — Z7901 Long term (current) use of anticoagulants: Secondary | ICD-10-CM | POA: Diagnosis not present

## 2016-06-20 LAB — POCT INR: INR: 3.4

## 2016-07-18 ENCOUNTER — Ambulatory Visit (INDEPENDENT_AMBULATORY_CARE_PROVIDER_SITE_OTHER): Payer: PPO | Admitting: *Deleted

## 2016-07-18 DIAGNOSIS — Z7901 Long term (current) use of anticoagulants: Secondary | ICD-10-CM | POA: Diagnosis not present

## 2016-07-18 DIAGNOSIS — Z5181 Encounter for therapeutic drug level monitoring: Secondary | ICD-10-CM | POA: Diagnosis not present

## 2016-07-18 DIAGNOSIS — I4891 Unspecified atrial fibrillation: Secondary | ICD-10-CM | POA: Diagnosis not present

## 2016-07-18 LAB — POCT INR: INR: 2

## 2016-07-20 DIAGNOSIS — G4733 Obstructive sleep apnea (adult) (pediatric): Secondary | ICD-10-CM | POA: Diagnosis not present

## 2016-07-20 DIAGNOSIS — A000 Cholera due to Vibrio cholerae 01, biovar cholerae: Secondary | ICD-10-CM | POA: Diagnosis not present

## 2016-07-27 DIAGNOSIS — L821 Other seborrheic keratosis: Secondary | ICD-10-CM | POA: Diagnosis not present

## 2016-07-27 DIAGNOSIS — Z85828 Personal history of other malignant neoplasm of skin: Secondary | ICD-10-CM | POA: Diagnosis not present

## 2016-07-27 DIAGNOSIS — L985 Mucinosis of the skin: Secondary | ICD-10-CM | POA: Diagnosis not present

## 2016-07-27 DIAGNOSIS — L308 Other specified dermatitis: Secondary | ICD-10-CM | POA: Diagnosis not present

## 2016-07-27 DIAGNOSIS — L814 Other melanin hyperpigmentation: Secondary | ICD-10-CM | POA: Diagnosis not present

## 2016-07-27 DIAGNOSIS — D1801 Hemangioma of skin and subcutaneous tissue: Secondary | ICD-10-CM | POA: Diagnosis not present

## 2016-07-27 DIAGNOSIS — L570 Actinic keratosis: Secondary | ICD-10-CM | POA: Diagnosis not present

## 2016-07-27 DIAGNOSIS — D485 Neoplasm of uncertain behavior of skin: Secondary | ICD-10-CM | POA: Diagnosis not present

## 2016-07-27 DIAGNOSIS — D225 Melanocytic nevi of trunk: Secondary | ICD-10-CM | POA: Diagnosis not present

## 2016-08-09 ENCOUNTER — Other Ambulatory Visit: Payer: Self-pay | Admitting: *Deleted

## 2016-08-09 MED ORDER — WARFARIN SODIUM 5 MG PO TABS: ORAL_TABLET | ORAL | 1 refills | Status: DC

## 2016-08-15 ENCOUNTER — Ambulatory Visit (INDEPENDENT_AMBULATORY_CARE_PROVIDER_SITE_OTHER): Payer: PPO | Admitting: *Deleted

## 2016-08-15 DIAGNOSIS — Z7901 Long term (current) use of anticoagulants: Secondary | ICD-10-CM | POA: Diagnosis not present

## 2016-08-15 DIAGNOSIS — I4891 Unspecified atrial fibrillation: Secondary | ICD-10-CM | POA: Diagnosis not present

## 2016-08-15 DIAGNOSIS — Z5181 Encounter for therapeutic drug level monitoring: Secondary | ICD-10-CM | POA: Diagnosis not present

## 2016-08-15 LAB — POCT INR: INR: 2.8

## 2016-08-17 ENCOUNTER — Encounter: Payer: PPO | Admitting: *Deleted

## 2016-08-17 ENCOUNTER — Telehealth: Payer: Self-pay | Admitting: Cardiology

## 2016-08-17 NOTE — Telephone Encounter (Signed)
LMOVM reminding pt to send remote transmission.   

## 2016-08-18 ENCOUNTER — Encounter: Payer: Self-pay | Admitting: Cardiology

## 2016-08-22 ENCOUNTER — Telehealth: Payer: Self-pay | Admitting: Internal Medicine

## 2016-08-22 ENCOUNTER — Ambulatory Visit (INDEPENDENT_AMBULATORY_CARE_PROVIDER_SITE_OTHER): Payer: PPO | Admitting: *Deleted

## 2016-08-22 DIAGNOSIS — I442 Atrioventricular block, complete: Secondary | ICD-10-CM

## 2016-08-22 NOTE — Telephone Encounter (Signed)
New message      Pt forgot to do a remote pacemaker check last week.  He will do it today.

## 2016-08-22 NOTE — Progress Notes (Signed)
Remote pacemaker transmission.   

## 2016-08-23 ENCOUNTER — Encounter: Payer: Self-pay | Admitting: Cardiology

## 2016-08-23 LAB — CUP PACEART REMOTE DEVICE CHECK
Battery Impedance: 1305 Ohm
Battery Remaining Longevity: 45 mo
Battery Voltage: 2.75 V
Date Time Interrogation Session: 20180219194510
Implantable Lead Location: 753860
Implantable Lead Model: 5076
Implantable Pulse Generator Implant Date: 20130702
Lead Channel Impedance Value: 0 Ohm
Lead Channel Pacing Threshold Pulse Width: 0.4 ms
Lead Channel Setting Pacing Pulse Width: 0.4 ms
Lead Channel Setting Sensing Sensitivity: 4 mV
MDC IDC LEAD IMPLANT DT: 20130702
MDC IDC MSMT LEADCHNL RV IMPEDANCE VALUE: 448 Ohm
MDC IDC MSMT LEADCHNL RV PACING THRESHOLD AMPLITUDE: 0.625 V
MDC IDC SET LEADCHNL RV PACING AMPLITUDE: 2.5 V
MDC IDC STAT BRADY RV PERCENT PACED: 98 %

## 2016-09-06 ENCOUNTER — Telehealth: Payer: Self-pay | Admitting: Internal Medicine

## 2016-09-06 MED ORDER — CLONAZEPAM 2 MG PO TABS: 2.0000 mg | ORAL_TABLET | Freq: Every day | ORAL | 1 refills | Status: DC

## 2016-09-06 NOTE — Telephone Encounter (Signed)
Ok to send to mail order?  

## 2016-09-06 NOTE — Telephone Encounter (Signed)
Spoke with pt. States that he is needing a refill on Clonazepam 2mg . This was last refilled on 06/13/16 #90 with 1 refill but this was called into [facility]. Pt is now needing this go to [facility] and the additional refill at [facility] can't be transferred. His last OV was on 04/28/16 with CY.  CY - please advise on refill. Thanks.

## 2016-09-06 NOTE — Telephone Encounter (Signed)
Rx has been called in to [facility]. Pt is aware. Nothing further was needed.

## 2016-09-20 DIAGNOSIS — Z85528 Personal history of other malignant neoplasm of kidney: Secondary | ICD-10-CM | POA: Diagnosis not present

## 2016-09-20 DIAGNOSIS — R911 Solitary pulmonary nodule: Secondary | ICD-10-CM | POA: Diagnosis not present

## 2016-09-20 DIAGNOSIS — Z1389 Encounter for screening for other disorder: Secondary | ICD-10-CM | POA: Diagnosis not present

## 2016-09-20 DIAGNOSIS — I2729 Other secondary pulmonary hypertension: Secondary | ICD-10-CM | POA: Diagnosis not present

## 2016-09-20 DIAGNOSIS — N183 Chronic kidney disease, stage 3 (moderate): Secondary | ICD-10-CM | POA: Diagnosis not present

## 2016-09-20 DIAGNOSIS — G4733 Obstructive sleep apnea (adult) (pediatric): Secondary | ICD-10-CM | POA: Diagnosis not present

## 2016-09-20 DIAGNOSIS — Z0000 Encounter for general adult medical examination without abnormal findings: Secondary | ICD-10-CM | POA: Diagnosis not present

## 2016-09-22 ENCOUNTER — Telehealth: Payer: Self-pay | Admitting: Internal Medicine

## 2016-09-22 MED ORDER — CLONAZEPAM 2 MG PO TABS: 2.0000 mg | ORAL_TABLET | Freq: Every day | ORAL | 1 refills | Status: DC

## 2016-09-22 NOTE — Telephone Encounter (Signed)
Spoke with pt, requesting to switch his 90 day rx of clonazepam to [facility] on [address]- states he is out of medication and is having difficulty getting his rx filled through his mail order pharmacy.  RX was called in to [facility] on 09/06/16 and has still not been received d/t pt filling a 7 day rx through local pharmacy to last until mail order rx was received.  CY ok to switch rx to [facility]?  Thanks.

## 2016-09-22 NOTE — Telephone Encounter (Signed)
[facility] was [facility] to [facility]  LMOM informing the pt

## 2016-09-22 NOTE — Telephone Encounter (Signed)
k for 90 day Rx clonazepam locally as requested

## 2016-09-27 ENCOUNTER — Ambulatory Visit (INDEPENDENT_AMBULATORY_CARE_PROVIDER_SITE_OTHER): Payer: PPO

## 2016-09-27 DIAGNOSIS — Z7901 Long term (current) use of anticoagulants: Secondary | ICD-10-CM

## 2016-09-27 DIAGNOSIS — Z5181 Encounter for therapeutic drug level monitoring: Secondary | ICD-10-CM

## 2016-09-27 DIAGNOSIS — I4891 Unspecified atrial fibrillation: Secondary | ICD-10-CM

## 2016-09-27 LAB — POCT INR: INR: 1.6

## 2016-10-10 ENCOUNTER — Ambulatory Visit
Admission: RE | Admit: 2016-10-10 | Discharge: 2016-10-10 | Disposition: A | Discharge status: Home / Self Care | Payer: PPO | Source: Ambulatory Visit | Attending: Internal Medicine | Admitting: Internal Medicine

## 2016-10-10 ENCOUNTER — Other Ambulatory Visit: Payer: Self-pay | Admitting: Internal Medicine

## 2016-10-10 DIAGNOSIS — R05 Cough: Secondary | ICD-10-CM

## 2016-10-10 DIAGNOSIS — R059 Cough, unspecified: Secondary | ICD-10-CM

## 2016-10-17 ENCOUNTER — Ambulatory Visit (INDEPENDENT_AMBULATORY_CARE_PROVIDER_SITE_OTHER): Payer: PPO | Admitting: *Deleted

## 2016-10-17 DIAGNOSIS — I4891 Unspecified atrial fibrillation: Secondary | ICD-10-CM | POA: Diagnosis not present

## 2016-10-17 DIAGNOSIS — Z5181 Encounter for therapeutic drug level monitoring: Secondary | ICD-10-CM

## 2016-10-17 DIAGNOSIS — Z7901 Long term (current) use of anticoagulants: Secondary | ICD-10-CM | POA: Diagnosis not present

## 2016-10-17 LAB — POCT INR: INR: 2.3

## 2016-10-18 ENCOUNTER — Ambulatory Visit
Admission: RE | Admit: 2016-10-18 | Discharge: 2016-10-18 | Disposition: A | Discharge status: Home / Self Care | Payer: PPO | Source: Ambulatory Visit | Attending: Internal Medicine | Admitting: Internal Medicine

## 2016-10-18 ENCOUNTER — Other Ambulatory Visit: Payer: Self-pay | Admitting: Internal Medicine

## 2016-10-18 DIAGNOSIS — J181 Lobar pneumonia, unspecified organism: Principal | ICD-10-CM

## 2016-10-18 DIAGNOSIS — J189 Pneumonia, unspecified organism: Secondary | ICD-10-CM

## 2016-10-18 DIAGNOSIS — R05 Cough: Secondary | ICD-10-CM | POA: Diagnosis not present

## 2016-10-24 ENCOUNTER — Telehealth: Payer: Self-pay

## 2016-10-24 ENCOUNTER — Ambulatory Visit
Admission: RE | Admit: 2016-10-24 | Discharge: 2016-10-24 | Disposition: A | Discharge status: Home / Self Care | Payer: PPO | Source: Ambulatory Visit | Attending: Internal Medicine | Admitting: Internal Medicine

## 2016-10-24 ENCOUNTER — Other Ambulatory Visit: Payer: Self-pay | Admitting: Internal Medicine

## 2016-10-24 DIAGNOSIS — R079 Chest pain, unspecified: Secondary | ICD-10-CM | POA: Diagnosis not present

## 2016-10-24 DIAGNOSIS — R0789 Other chest pain: Secondary | ICD-10-CM

## 2016-10-24 DIAGNOSIS — M79602 Pain in left arm: Secondary | ICD-10-CM

## 2016-10-24 DIAGNOSIS — S4992XA Unspecified injury of left shoulder and upper arm, initial encounter: Secondary | ICD-10-CM | POA: Diagnosis not present

## 2016-10-24 DIAGNOSIS — S299XXA Unspecified injury of thorax, initial encounter: Secondary | ICD-10-CM | POA: Diagnosis not present

## 2016-10-24 NOTE — Telephone Encounter (Signed)
Pt called into clinic to report a fall this past weekend, pt states he fell and hit his arm.  Arm is swollen went to see primary MD and he told pt it was swollen secondary to fall on Coumadin.  He states Dr Laurann Montana examined arm and nothing is broken it is just swollen and sore.  Primary MD advised swelling will improve over time, no further intervention needed.  Pt states he just wanted to let us know since we ask him if he has had any bleeding problems.  Advised pt to continue to monitor and call primary MD back for further assessment and work up if swelling worsens.  Pt verbalized understanding.

## 2016-11-09 DIAGNOSIS — S4982XA Other specified injuries of left shoulder and upper arm, initial encounter: Secondary | ICD-10-CM | POA: Diagnosis not present

## 2016-11-14 ENCOUNTER — Other Ambulatory Visit: Payer: Self-pay | Admitting: Internal Medicine

## 2016-11-14 ENCOUNTER — Ambulatory Visit (INDEPENDENT_AMBULATORY_CARE_PROVIDER_SITE_OTHER): Payer: PPO | Admitting: *Deleted

## 2016-11-14 DIAGNOSIS — I4891 Unspecified atrial fibrillation: Secondary | ICD-10-CM

## 2016-11-14 DIAGNOSIS — Z5181 Encounter for therapeutic drug level monitoring: Secondary | ICD-10-CM | POA: Diagnosis not present

## 2016-11-14 DIAGNOSIS — Z7901 Long term (current) use of anticoagulants: Secondary | ICD-10-CM

## 2016-11-14 LAB — POCT INR: INR: 2.9

## 2016-11-21 ENCOUNTER — Ambulatory Visit (INDEPENDENT_AMBULATORY_CARE_PROVIDER_SITE_OTHER): Payer: PPO | Admitting: *Deleted

## 2016-11-21 ENCOUNTER — Telehealth: Payer: Self-pay | Admitting: Cardiology

## 2016-11-21 DIAGNOSIS — I442 Atrioventricular block, complete: Secondary | ICD-10-CM

## 2016-11-21 NOTE — Telephone Encounter (Signed)
Spoke with pt and reminded pt of remote transmission that is due today. Pt verbalized understanding.   

## 2016-11-22 LAB — CUP PACEART REMOTE DEVICE CHECK
Battery Remaining Longevity: 41 mo
Implantable Lead Implant Date: 20130702
Implantable Lead Model: 5076
Implantable Pulse Generator Implant Date: 20130702
Lead Channel Impedance Value: 471 Ohm
Lead Channel Pacing Threshold Amplitude: 0.75 V
Lead Channel Pacing Threshold Pulse Width: 0.4 ms
MDC IDC LEAD LOCATION: 753860
MDC IDC MSMT BATTERY IMPEDANCE: 1502 Ohm
MDC IDC MSMT BATTERY VOLTAGE: 2.74 V
MDC IDC MSMT LEADCHNL RA IMPEDANCE VALUE: 0 Ohm
MDC IDC SESS DTM: 20180521190159
MDC IDC SET LEADCHNL RV PACING AMPLITUDE: 2.5 V
MDC IDC SET LEADCHNL RV PACING PULSEWIDTH: 0.4 ms
MDC IDC SET LEADCHNL RV SENSING SENSITIVITY: 4 mV
MDC IDC STAT BRADY RV PERCENT PACED: 98 %

## 2016-11-22 NOTE — Progress Notes (Signed)
Remote pacemaker transmission.   

## 2016-11-23 ENCOUNTER — Encounter: Payer: Self-pay | Admitting: Cardiology

## 2016-12-08 ENCOUNTER — Other Ambulatory Visit: Payer: Self-pay | Admitting: Internal Medicine

## 2016-12-08 MED ORDER — WARFARIN SODIUM 5 MG PO TABS: ORAL_TABLET | ORAL | 1 refills | Status: DC

## 2016-12-08 NOTE — Telephone Encounter (Signed)
°*  STAT* If patient is at the pharmacy, call can be transferred to refill team.   1. Which medications need to be refilled? (please list name of each medication and dose if known) Warfarin-New prescription 2. Which pharmacy/location (including street and city if local pharmacy) is medication to be sent to?[facility] on [address]  3. Do they need a 30 day or 90 day supply? 90 and refills

## 2016-12-08 NOTE — Telephone Encounter (Signed)
Refill done for Warfarin as requested

## 2016-12-14 ENCOUNTER — Other Ambulatory Visit: Payer: Self-pay | Admitting: Internal Medicine

## 2016-12-14 ENCOUNTER — Ambulatory Visit
Admission: RE | Admit: 2016-12-14 | Discharge: 2016-12-14 | Disposition: A | Discharge status: Home / Self Care | Payer: PPO | Source: Ambulatory Visit | Attending: Internal Medicine | Admitting: Internal Medicine

## 2016-12-14 DIAGNOSIS — M62830 Muscle spasm of back: Secondary | ICD-10-CM | POA: Diagnosis not present

## 2016-12-14 DIAGNOSIS — M47816 Spondylosis without myelopathy or radiculopathy, lumbar region: Secondary | ICD-10-CM | POA: Diagnosis not present

## 2016-12-21 ENCOUNTER — Ambulatory Visit (INDEPENDENT_AMBULATORY_CARE_PROVIDER_SITE_OTHER): Payer: PPO

## 2016-12-21 DIAGNOSIS — I4891 Unspecified atrial fibrillation: Secondary | ICD-10-CM | POA: Diagnosis not present

## 2016-12-21 DIAGNOSIS — Z5181 Encounter for therapeutic drug level monitoring: Secondary | ICD-10-CM

## 2016-12-21 DIAGNOSIS — Z7901 Long term (current) use of anticoagulants: Secondary | ICD-10-CM | POA: Diagnosis not present

## 2016-12-21 LAB — POCT INR: INR: 2.2

## 2017-02-01 ENCOUNTER — Ambulatory Visit (INDEPENDENT_AMBULATORY_CARE_PROVIDER_SITE_OTHER): Payer: PPO | Admitting: *Deleted

## 2017-02-01 ENCOUNTER — Encounter: Payer: PPO | Admitting: Internal Medicine

## 2017-02-01 DIAGNOSIS — Z5181 Encounter for therapeutic drug level monitoring: Secondary | ICD-10-CM | POA: Diagnosis not present

## 2017-02-01 DIAGNOSIS — I4891 Unspecified atrial fibrillation: Secondary | ICD-10-CM

## 2017-02-01 DIAGNOSIS — Z7901 Long term (current) use of anticoagulants: Secondary | ICD-10-CM | POA: Diagnosis not present

## 2017-02-01 LAB — POCT INR: INR: 2.5

## 2017-02-02 DIAGNOSIS — N183 Chronic kidney disease, stage 3 (moderate): Secondary | ICD-10-CM | POA: Diagnosis not present

## 2017-02-02 DIAGNOSIS — M47816 Spondylosis without myelopathy or radiculopathy, lumbar region: Secondary | ICD-10-CM | POA: Diagnosis not present

## 2017-02-02 DIAGNOSIS — I4891 Unspecified atrial fibrillation: Secondary | ICD-10-CM | POA: Diagnosis not present

## 2017-02-02 DIAGNOSIS — J40 Bronchitis, not specified as acute or chronic: Secondary | ICD-10-CM | POA: Diagnosis not present

## 2017-02-02 DIAGNOSIS — N400 Enlarged prostate without lower urinary tract symptoms: Secondary | ICD-10-CM | POA: Diagnosis not present

## 2017-02-02 DIAGNOSIS — M152 Bouchard's nodes (with arthropathy): Secondary | ICD-10-CM | POA: Diagnosis not present

## 2017-02-08 DIAGNOSIS — N401 Enlarged prostate with lower urinary tract symptoms: Secondary | ICD-10-CM | POA: Diagnosis not present

## 2017-02-08 DIAGNOSIS — C651 Malignant neoplasm of right renal pelvis: Secondary | ICD-10-CM | POA: Diagnosis not present

## 2017-02-08 DIAGNOSIS — R3916 Straining to void: Secondary | ICD-10-CM | POA: Diagnosis not present

## 2017-02-10 ENCOUNTER — Telehealth: Payer: Self-pay | Admitting: Internal Medicine

## 2017-02-10 NOTE — Telephone Encounter (Signed)
New message     Pt c/o swelling: STAT is pt has developed SOB within 24 hours  1. How long have you been experiencing swelling? Early this week , he isnt having any pain, just really swollen in left leg   2. Where is the swelling located?from knees down  3.  Are you currently taking a "fluid pill"? yes  4.  Are you currently SOB?  no  5.  Have you traveled recently? no

## 2017-02-10 NOTE — Telephone Encounter (Signed)
Swelling in both legs(left worse than right) but says in the morning when he gets up they are back to normal.  He wears support hose daily.  He says that they are not hurting him but his wife wanted him to call.  I am going to move his appointment up to 02/20/17 at 12:30pm.  He will let me know if she feels he needs to be seen sooner.

## 2017-02-20 ENCOUNTER — Encounter (INDEPENDENT_AMBULATORY_CARE_PROVIDER_SITE_OTHER): Payer: Self-pay

## 2017-02-20 ENCOUNTER — Encounter: Payer: Self-pay | Admitting: Internal Medicine

## 2017-02-20 ENCOUNTER — Ambulatory Visit (INDEPENDENT_AMBULATORY_CARE_PROVIDER_SITE_OTHER): Payer: PPO | Admitting: Internal Medicine

## 2017-02-20 VITALS — BP 140/76 | HR 78 | Ht 70.0 in | Wt 187.2 lb

## 2017-02-20 DIAGNOSIS — I482 Chronic atrial fibrillation: Secondary | ICD-10-CM

## 2017-02-20 DIAGNOSIS — H0011 Chalazion right upper eyelid: Secondary | ICD-10-CM | POA: Diagnosis not present

## 2017-02-20 DIAGNOSIS — H04123 Dry eye syndrome of bilateral lacrimal glands: Secondary | ICD-10-CM | POA: Diagnosis not present

## 2017-02-20 DIAGNOSIS — I442 Atrioventricular block, complete: Secondary | ICD-10-CM | POA: Diagnosis not present

## 2017-02-20 DIAGNOSIS — I4821 Permanent atrial fibrillation: Secondary | ICD-10-CM

## 2017-02-20 MED ORDER — FUROSEMIDE 40 MG PO TABS: 40.0000 mg | ORAL_TABLET | Freq: Every day | ORAL | 3 refills | Status: DC

## 2017-02-20 NOTE — Patient Instructions (Addendum)
Medication Instructions:  Your physician recommends that you continue on your [place] medications as directed. Please refer to the [place] Medication list given to you today.   Labwork: None ordered   Testing/Procedures: None ordered   Follow-Up: Remote monitoring is used to monitor your Pacemaker from home. This monitoring reduces the number of office visits required to check your device to one time per year. It allows [place] to keep an eye on the functioning of your device to ensure it is working properly. You are scheduled for a device check from home on 03/13/17. You may send your transmission at any time that day. If you have a wireless device, the transmission will be sent automatically. After your physician reviews your transmission, you will receive a postcard with your next transmission date.   Your physician wants you to follow-up in: 12 months with Dr Vallery Ridge will receive a reminder letter in the mail two months in advance. If you don't receive a letter, please call our office to schedule the follow-up appointment.      Low-Sodium Eating Plan Sodium, which is an element that makes up salt, helps you maintain a healthy balance of fluids in your body. Too much sodium can increase your blood pressure and cause fluid and waste to be held in your body. Your health care provider or dietitian may recommend following this plan if you have high blood pressure (hypertension), kidney disease, liver disease, or heart failure. Eating less sodium can help lower your blood pressure, reduce swelling, and protect your heart, liver, and kidneys. What are tips for following this plan? General guidelines  Most people on this plan should limit their sodium intake to 2,000 mg (milligrams) of sodium each day. Reading food labels  The Nutrition Facts label lists the amount of sodium in one serving of the food. If you eat more than one serving, you must multiply the listed amount of sodium by the  number of servings.  Choose foods with less than 140 mg of sodium per serving.  Avoid foods with 300 mg of sodium or more per serving. Shopping  Look for lower-sodium products, often labeled as "low-sodium" or "no salt added."  Always check the sodium content even if foods are labeled as "unsalted" or "no salt added".  Buy fresh foods. ? Avoid canned foods and premade or frozen meals. ? Avoid canned, cured, or processed meats  Buy breads that have less than 80 mg of sodium per slice. Cooking  Eat more home-cooked food and less restaurant, buffet, and fast food.  Avoid adding salt when cooking. Use salt-free seasonings or herbs instead of table salt or sea salt. Check with your health care provider or pharmacist before using salt substitutes.  Cook with plant-based oils, such as canola, sunflower, or olive oil. Meal planning  When eating at a restaurant, ask that your food be prepared with less salt or no salt, if possible.  Avoid foods that contain MSG (monosodium glutamate). MSG is sometimes added to [place] food, bouillon, and some canned foods. What foods are recommended? The items listed may not be a complete list. Talk with your dietitian about what dietary choices are best for you. Grains Low-sodium cereals, including oats, puffed wheat and rice, and shredded wheat. Low-sodium crackers. Unsalted rice. Unsalted pasta. Low-sodium bread. Whole-grain breads and whole-grain pasta. Vegetables Fresh or frozen vegetables. "No salt added" canned vegetables. "No salt added" tomato sauce and paste. Low-sodium or reduced-sodium tomato and vegetable juice. Fruits Fresh, frozen, or canned fruit.  Fruit juice. Meats and other protein foods Fresh or frozen (no salt added) meat, poultry, seafood, and fish. Low-sodium canned tuna and salmon. Unsalted nuts. Dried peas, beans, and lentils without added salt. Unsalted canned beans. Eggs. Unsalted nut butters. Dairy Milk. Soy milk. Cheese that  is naturally low in sodium, such as ricotta cheese, fresh mozzarella, or Swiss cheese Low-sodium or reduced-sodium cheese. Cream cheese. Yogurt. Fats and oils Unsalted butter. Unsalted margarine with no trans fat. Vegetable oils such as canola or olive oils. Seasonings and other foods Fresh and dried herbs and spices. Salt-free seasonings. Low-sodium mustard and ketchup. Sodium-free salad dressing. Sodium-free light mayonnaise. Fresh or refrigerated horseradish. Lemon juice. Vinegar. Homemade, reduced-sodium, or low-sodium soups. Unsalted popcorn and pretzels. Low-salt or salt-free chips. What foods are not recommended? The items listed may not be a complete list. Talk with your dietitian about what dietary choices are best for you. Grains Instant hot cereals. Bread stuffing, pancake, and biscuit mixes. Croutons. Seasoned rice or pasta mixes. Noodle soup cups. Boxed or frozen macaroni and cheese. Regular salted crackers. Self-rising flour. Vegetables Sauerkraut, pickled vegetables, and relishes. Olives. [place] fries. Onion rings. Regular canned vegetables (not low-sodium or reduced-sodium). Regular canned tomato sauce and paste (not low-sodium or reduced-sodium). Regular tomato and vegetable juice (not low-sodium or reduced-sodium). Frozen vegetables in sauces. Meats and other protein foods Meat or fish that is salted, canned, smoked, spiced, or pickled. Bacon, ham, sausage, hotdogs, corned beef, chipped beef, packaged lunch meats, salt pork, jerky, pickled herring, anchovies, regular canned tuna, sardines, salted nuts. Dairy Processed cheese and cheese spreads. Cheese curds. Blue cheese. Feta cheese. String cheese. Regular cottage cheese. Buttermilk. Canned milk. Fats and oils Salted butter. Regular margarine. Ghee. Bacon fat. Seasonings and other foods Onion salt, garlic salt, seasoned salt, table salt, and sea salt. Canned and packaged gravies. Worcestershire sauce. Tartar sauce. Barbecue sauce.  Teriyaki sauce. Soy sauce, including reduced-sodium. Steak sauce. Fish sauce. Oyster sauce. Cocktail sauce. Horseradish that you find on the shelf. Regular ketchup and mustard. Meat flavorings and tenderizers. Bouillon cubes. Hot sauce and Tabasco sauce. Premade or packaged marinades. Premade or packaged taco seasonings. Relishes. Regular salad dressings. Salsa. Potato and tortilla chips. Corn chips and puffs. Salted popcorn and pretzels. Canned or dried soups. Pizza. Frozen entrees and pot pies. Summary  Eating less sodium can help lower your blood pressure, reduce swelling, and protect your heart, liver, and kidneys.  Most people on this plan should limit their sodium intake to 1,500-2,000 mg (milligrams) of sodium each day.  Canned, boxed, and frozen foods are high in sodium. Restaurant foods, fast foods, and pizza are also very high in sodium. You also get sodium by adding salt to food.  Try to cook at home, eat more fresh fruits and vegetables, and eat less fast food, canned, processed, or prepared foods. This information is not intended to replace advice given to you by your health care provider. Make sure you discuss any questions you have with your health care provider. Document Released: 12/10/2001 Document Revised: 06/13/2016 Document Reviewed: 06/13/2016 Elsevier Interactive Patient Education  2017 [employer].

## 2017-02-20 NOTE — Progress Notes (Signed)
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 male who presents today for routine electrophysiology followup.  Since last being seen in our clinic, the patient reports doing very well.  + edema. He wears support hose.  He has slowly progressing dementia but still drives.  Today, he denies symptoms of palpitations, chest pain, shortness of breath,  dizziness, presyncope, or syncope.  He has R eye redness with swelling and mildly reduced vision.  This has been present since late last week.  His wife has encouraged him to be seen by opthalmology but he has declined. The patient is otherwise without complaint today.   Past Medical History:  Diagnosis Date  . Allergic rhinitis   . Arthritis   . BPH (benign prostatic hyperplasia)   . Bronchitis   . Complete heart block ([place])    pacemaker originally placed in 1988  . Dementia   . Esophageal reflux   . Hypertension   . Lung nodule    noted on CXR December 2012  . Normal nuclear stress test 2013  . OSA (obstructive sleep apnea)    uses cpap  . Permanent atrial fibrillation (H[facility])    on coumadin  . Pneumonia   . Presence of permanent cardiac pacemaker   . Renal insufficiency    Rt kidney removed d/t maglinant tumor  . RLS (restless legs syndrome)   . Stroke [facility])    TIA greater than 20 years  . Transitional cell carcinoma ([place])    s/p nephrectomy in  2010   Past Surgical History:  Procedure Laterality Date  . CARDIOVASCULAR STRESS TEST  03/23/2009   EF 65%, NORMAL  . CATARACT EXTRACTION W/PHACO Left 08/26/2015   Procedure: CATARACT EXTRACTION PHACO AND INTRAOCULAR LENS PLACEMENT (IOC);  Surgeon: Marylynn Pearson, MD;  Location: [place];  Service: Ophthalmology;  Laterality: Left;  . DOPPLER ECHOCARDIOGRAPHY  03/26/2001   EF 55%  . EYE SURGERY     tumor removed from Rt eye lid  . HERNIA REPAIR     multiple  . MASS EXCISION Left 11/07/2012   Procedure: MINOR EXCISION OF MASS;  Surgeon: Haywood Lasso,  MD;  Location: [place];  Service: General;  Laterality: Left;  . NEPHRECTOMY  2010   rt  . PACEMAKER INSERTION  1988   most recent generator change by Dr Rayann Heman 20067/2/13 with new right ventricular lead placed  . PERMANENT PACEMAKER GENERATOR CHANGE N/A 01/03/2012   Procedure: PERMANENT PACEMAKER GENERATOR CHANGE;  Surgeon: Thompson Grayer, MD;  [facility] C[facility] LAB;  Service: Cardiovascular;  Laterality: N/A;  . US ECHOCARDIOGRAPHY  04/22/2008   EF 55-60%    ROS- all systems are reviewed and negative except as per HPI above  [name] Outpatient Prescriptions  Medication Sig Dispense Refill  . acetaminophen (TYLENOL) 325 MG tablet Take 650 mg by mouth daily as needed for mild pain or fever.    Marland Kitchen aspirin 81 MG tablet Take 81 mg by mouth every morning.     . clonazePAM (KLONOPIN) 2 MG tablet Take 1 tablet (2 mg total) by mouth at bedtime. 90 tablet 1  . cycloSPORINE (RESTASIS) 0.05 % ophthalmic emulsion Place 1 drop into both eyes 2 (two) times daily.     Marland Kitchen doxazosin (CARDURA) 2 MG tablet Take 2 mg by mouth daily.    . finasteride (PROSCAR) 5 MG tablet Take 5 mg by mouth daily.    . furosemide (LASIX) 40  MG tablet Take 1 tablet by mouth once daily 90 tablet 2  . galantamine (RAZADYNE) 8 MG tablet Take 8 mg by mouth 2 (two) times daily.    Marland Kitchen glucosamine-chondroitin 500-400 MG tablet Take 2 tablets by mouth every evening.    . hydroxypropyl methylcellulose (ISOPTO TEARS) 2.5 % ophthalmic solution Place 1 drop into both eyes 4 (four) times daily as needed for dry eyes.     Marland Kitchen ipratropium (ATROVENT) 0.06 % nasal spray Place 2 sprays into both nostrils 2 (two) times daily.    . metoprolol tartrate (LOPRESSOR) 25 MG tablet Take 25 mg by mouth 2 (two) times daily.    . Multiple Vitamin (MULTIVITAMIN) capsule Take 1 capsule by mouth daily.      Marland Kitchen OVER THE COUNTER MEDICATION Apply 1 application topically at bedtime. Eye gel    . saccharomyces boulardii (FLORASTOR) 250 MG capsule Take 1  capsule (250 mg total) by mouth 2 (two) times daily. 30 capsule 0  . warfarin (COUMADIN) 5 MG tablet Take as directed by coumadin clinic 90 tablet 1   No [place] facility-administered medications for this visit.     Physical Exam: Vitals:   02/20/17 1252  BP: 140/76  Pulse: 78  SpO2: 99%  Weight: 187 lb 3.2 oz (84.9 kg)  Height: 5\' 10"  (1.778 m)    GEN- The patient is elderly appearing, alert and oriented x 3 today.   Head- normocephalic, atraumatic Eyes-  R eye with periorbital redness and minor swelling.  Vision grossly intact,  There is mild purulence from the medical corner of his eye,  EOMI, PERRLA Ears- hearing intact Oropharynx- clear Lungs- Clear to ausculation bilaterally, normal work of breathing Chest- pacemaker pocket is well healed Heart- Regular rate and rhythm (paced)  PMI not laterally displaced GI- soft, NT, ND, + BS Extremities- no clubbing, cyanosis, +1 L>R edema  Pacemaker interrogation- reviewed in detail today,  See PACEART report  ekg tracing ordered today is personally reviewed and shows afib, V paced at 73 bpm  Assessment and Plan:  1. complete heart block Normal pacemaker function See Pace Art report No changes today  2. R eye redness/ swelling I had a long discussion regarding this today.  I have strongly advised that he see opthalmology today as I am worried about periorbital cellulitis.  He states that he will comply.  My nurse will make referral and phone call to arrange.  3. Permanent afib Continue long term anticoagulation Given prior stroke on coumadin, he is now on both ASA and coumadin.  4. NSVT Occasionally seen on his PPM  Asymptomatic  carelink Return in a year  Thompson Grayer MD, [facility] 02/20/2017 1:10 PM

## 2017-02-22 NOTE — Addendum Note (Signed)
Addended by: Mendel Ryder on: 02/22/2017 07:42 AM   Modules accepted: Orders

## 2017-02-27 DIAGNOSIS — H26491 Other secondary cataract, right eye: Secondary | ICD-10-CM | POA: Diagnosis not present

## 2017-02-27 DIAGNOSIS — H26492 Other secondary cataract, left eye: Secondary | ICD-10-CM | POA: Diagnosis not present

## 2017-02-27 DIAGNOSIS — H0011 Chalazion right upper eyelid: Secondary | ICD-10-CM | POA: Diagnosis not present

## 2017-02-28 DIAGNOSIS — G4733 Obstructive sleep apnea (adult) (pediatric): Secondary | ICD-10-CM | POA: Diagnosis not present

## 2017-03-10 DIAGNOSIS — D485 Neoplasm of uncertain behavior of skin: Secondary | ICD-10-CM | POA: Diagnosis not present

## 2017-03-10 DIAGNOSIS — L821 Other seborrheic keratosis: Secondary | ICD-10-CM | POA: Diagnosis not present

## 2017-03-13 ENCOUNTER — Encounter: Payer: PPO | Admitting: Internal Medicine

## 2017-03-13 ENCOUNTER — Ambulatory Visit (INDEPENDENT_AMBULATORY_CARE_PROVIDER_SITE_OTHER): Payer: PPO | Admitting: *Deleted

## 2017-03-13 DIAGNOSIS — I4891 Unspecified atrial fibrillation: Secondary | ICD-10-CM

## 2017-03-13 DIAGNOSIS — Z7901 Long term (current) use of anticoagulants: Secondary | ICD-10-CM | POA: Diagnosis not present

## 2017-03-13 DIAGNOSIS — Z5181 Encounter for therapeutic drug level monitoring: Secondary | ICD-10-CM

## 2017-03-13 LAB — POCT INR: INR: 2.2

## 2017-03-20 ENCOUNTER — Telehealth: Payer: Self-pay | Admitting: Internal Medicine

## 2017-03-20 NOTE — Telephone Encounter (Signed)
Spoke with [name], he wants to get in to see Dr. Annamaria Boots before the end of October because he usually gives him the flu shot. Joellen Jersey can we use one of the visits that are on hold? Let me know. Thanks.

## 2017-03-20 NOTE — Telephone Encounter (Signed)
Pt can be seen in RNA slot but if he only wants to get Flu shot during that time he can come in on the injection schedule for flu shot only. Thanks.

## 2017-03-20 NOTE — Telephone Encounter (Signed)
Spoke with pt and given appt with Dr Annamaria Boots 05/01/17 4:30.  Pt verbalized understanding.  Nothing further needed

## 2017-04-04 ENCOUNTER — Telehealth: Payer: Self-pay | Admitting: Internal Medicine

## 2017-04-04 NOTE — Telephone Encounter (Signed)
Ok to refill 

## 2017-04-04 NOTE — Telephone Encounter (Signed)
Called in Rx for Clonazepam to [facility]. Spoke with pt and advised Rx was sent into the pharmacy of choice. Pt understood and nothing further is needed.

## 2017-04-04 NOTE — Telephone Encounter (Signed)
Refill on Clonazepam 2 mg. Please advise CY   Last RX: 09/22/2016 Last appt 04/28/2016  [facility] Prescriptions on File Prior to Visit  Medication Sig Dispense Refill  . acetaminophen (TYLENOL) 325 MG tablet Take 650 mg by mouth daily as needed for mild pain or fever.    Marland Kitchen aspirin 81 MG tablet Take 81 mg by mouth every morning.     . clonazePAM (KLONOPIN) 2 MG tablet Take 1 tablet (2 mg total) by mouth at bedtime. 90 tablet 1  . cycloSPORINE (RESTASIS) 0.05 % ophthalmic emulsion Place 1 drop into both eyes 2 (two) times daily.     Marland Kitchen doxazosin (CARDURA) 2 MG tablet Take 2 mg by mouth daily.    . finasteride (PROSCAR) 5 MG tablet Take 5 mg by mouth daily.    . furosemide (LASIX) 40 MG tablet Take 1 tablet (40 mg total) by mouth daily. 90 tablet 3  . galantamine (RAZADYNE) 8 MG tablet Take 8 mg by mouth 2 (two) times daily.    Marland Kitchen glucosamine-chondroitin 500-400 MG tablet Take 2 tablets by mouth every evening.    . hydroxypropyl methylcellulose (ISOPTO TEARS) 2.5 % ophthalmic solution Place 1 drop into both eyes 4 (four) times daily as needed for dry eyes.     Marland Kitchen ipratropium (ATROVENT) 0.06 % nasal spray Place 2 sprays into both nostrils 2 (two) times daily.    . metoprolol tartrate (LOPRESSOR) 25 MG tablet Take 25 mg by mouth 2 (two) times daily.    . Multiple Vitamin (MULTIVITAMIN) capsule Take 1 capsule by mouth daily.      Marland Kitchen OVER THE COUNTER MEDICATION Apply 1 application topically at bedtime. Eye gel    . saccharomyces boulardii (FLORASTOR) 250 MG capsule Take 1 capsule (250 mg total) by mouth 2 (two) times daily. 30 capsule 0  . warfarin (COUMADIN) 5 MG tablet Take as directed by coumadin clinic 90 tablet 1   No [place] facility-administered medications on file prior to visit.    No Known Allergies

## 2017-04-24 ENCOUNTER — Encounter (INDEPENDENT_AMBULATORY_CARE_PROVIDER_SITE_OTHER): Payer: Self-pay

## 2017-04-24 ENCOUNTER — Ambulatory Visit (INDEPENDENT_AMBULATORY_CARE_PROVIDER_SITE_OTHER): Payer: PPO

## 2017-04-24 DIAGNOSIS — I4891 Unspecified atrial fibrillation: Secondary | ICD-10-CM | POA: Diagnosis not present

## 2017-04-24 DIAGNOSIS — Z7901 Long term (current) use of anticoagulants: Secondary | ICD-10-CM

## 2017-04-24 DIAGNOSIS — Z5181 Encounter for therapeutic drug level monitoring: Secondary | ICD-10-CM | POA: Diagnosis not present

## 2017-04-24 LAB — POCT INR: INR: 2.1

## 2017-05-01 ENCOUNTER — Encounter: Payer: Self-pay | Admitting: Internal Medicine

## 2017-05-01 ENCOUNTER — Ambulatory Visit (INDEPENDENT_AMBULATORY_CARE_PROVIDER_SITE_OTHER): Payer: PPO | Admitting: Internal Medicine

## 2017-05-01 DIAGNOSIS — R911 Solitary pulmonary nodule: Secondary | ICD-10-CM | POA: Diagnosis not present

## 2017-05-01 DIAGNOSIS — G4733 Obstructive sleep apnea (adult) (pediatric): Secondary | ICD-10-CM

## 2017-05-01 NOTE — Progress Notes (Signed)
Subjective:    Patient ID: [name], male    DOB: 1929-09-08, 83 y.o.   MRN: [id number]  HPI  male never smoker followed for OSA, allergic rhinitis, bronchitis, multiple granulomas,  Cheyne Stokes, Restless legs, complicated by CM/  A. Fib/bradycardia tachycardia/pacer,  GERD. History right nephrectomy for cancer.  NPSG 03/09/06 AHI 39.7 --------------------------------------------------------------------------------------------------------- 04/28/2016-83 year old male never smoker followed for OSA, allergic rhinitis, bronchitis,multiple granulomas history Cheyne Stokes, Restless legs, complicated by CM/A. fib/bradycardia tachycardia/pacer, , GERD. History right nephrectomy for cancer. CPAP 9/[facility]  Using chinstrap with nasal mask LOV- NP 09/22/ 2017- bronchitis/ GERD. CXR FOLLOWS FOR: DME  [facility]. Pt wears CPAP every night for about 8 hours;pressure works well for pt; and would like to discuss getting supplies. Has had bronchitis and pneumonia this year attributed to aspiration. Sleeping on a wedge to minimize reflux. We discussed acid blockers and the swallowing instructions he is been given. Today feels chest is clear and normal, no longer coughing. CT reviewed. He requested TDAP vaccine- Katie explained this might not be covered due to lack of acute injury and he accepted responsibility if not covered. Doing very well with CPAP. Sleeping through the night and feeling well rested. Download confirms excellent compliance 100%/4 hour and excellent control AHI 2.9/hour with CPAP 9. CT chest 04/05/2016 IMPRESSION: 1. The 7 mm sub solid pulmonary nodule abutting the fissure on the prior exam has resolved and requires no other follow up. 2. Multiple other nodules in calcified granulomas are stable from 2013 and require no further follow up. 3. There is some scattered ground-glass opacities favoring the lung bases, these probably reflect low grade edema and for the most part are improved from  prior. 4. Coronary, aortic arch, and branch vessel atherosclerotic vascular disease. Mild to moderate cardiomegaly with small pericardial effusion. 5. Small left pleural effusion. Electronically Signed   By: Van Clines M.D.   On: 04/05/2016 16:12  05/01/17-  male never smoker followed for OSA, allergic rhinitis, bronchitis,multiple granulomas history Cheyne Stokes, Restless legs, complicated by CM/A. fib/bradycardia tachycardia/pacer, GERD. History right nephrectomy for cancer. ---OSA; DME [facility]. Pt uses CPAP nightly and DL is attached. Pt questions his CPAP presssure and would like to discuss concerns.  CPAP 9/[facility] Download 97% compliance, AHI 2.8/hour. He is very comfortable with CPAP and sleeps well with no concerns expressed. Feels better off with it. We discussed cleaning and maintenance of equipment. He denies cough or breathing concerns. Still uses clonazepam for restless legs, now refilled by Dr. Laurann Montana. CXR 10/18/16 IMPRESSION: Partial clearing of right lower lobe pneumonia. Right lower lobe infiltrate has improved in the interval with a mild amount of infiltrate remaining. No pleural effusion. Cardiac enlargement without heart failure. Pacemaker unchanged. One lead is disconnected from the generator pack which is unchanged from the prior study. Underlying COPD  ROS-see HPI  + = positive Constitutional:   No-   weight loss, night sweats, fevers, chills, fatigue, lassitude. HEENT:   No-  headaches, difficulty swallowing, tooth/dental problems, sore throat,       No-  sneezing, itching, ear ache, nasal congestion, post nasal drip,  CV:  No-   chest pain, orthopnea, PND, swelling in lower extremities, anasarca, +dizziness, palpitations Resp: + shortness of breath with exertion or at rest.              No-   productive cough,  No non-productive cough,  No- coughing up of blood.  No-   change in color of mucus.  No- wheezing.   Skin: No-   rash or  lesions. GI:  No-   heartburn, indigestion, abdominal pain, nausea, vomiting, GU:  MS:  No-   joint pain or swelling.   Neuro-     nothing unusual Psych:  No- change in mood or affect. No depression or anxiety.  No memory loss.  OBJ- Physical Exam General- Alert, Oriented, Affect-appropriate, Distress- none acute. +thin elderly Skin- rash-none, lesions- none, excoriation- none Lymphadenopathy- none Head- atraumatic            Eyes- Gross vision intact, PERRLA, conjunctivae and secretions clear            Ears- Hearing, canals-normal            Nose- Clear, no-Septal dev, mucus, polyps, erosion, perforation             Throat- Mallampati II , mucosa clear , drainage- none, tonsils- atrophic Neck- flexible , trachea midline, no stridor , thyroid nl, carotid no bruit Chest - symmetrical excursion , unlabored           Heart/CV- RRR , 2-3/6 SEM AS murmur , no gallop  , no rub, nl s1 s2                           - JVD- none , edema-+ 2/ support hose, stasis changes- , varices- none           Lung-clear, no rales, unlabored, wheeze- none, cough- none , dullness-none, rub- none           Chest wall- +R pacemaker Abd-  Br/ Gen/ Rectal- Not done, not indicated Extrem- cyanosis- none, clubbing, none, atrophy- none, strength- nl Neuro- grossly intact to observation  Assessment & Plan:

## 2017-05-01 NOTE — Patient Instructions (Addendum)
We can continue CPAP 9, mask of choice, humidifier, supplies, AirView   Dx OSA  Please call if we can help

## 2017-05-02 NOTE — Assessment & Plan Note (Signed)
Last imaging had recognized stable old granulomas. Last year he had had a pneumonia with follow-up CXR he says being done by his PCP at [facility] with no concerns.

## 2017-05-02 NOTE — Assessment & Plan Note (Signed)
He continues to benefit from CPAP and download confirms good compliance and control. No changes are needed. He will contact DME for supplies.

## 2017-05-17 ENCOUNTER — Other Ambulatory Visit: Payer: Self-pay | Admitting: Geriatric Medicine

## 2017-05-17 ENCOUNTER — Ambulatory Visit
Admission: RE | Admit: 2017-05-17 | Discharge: 2017-05-17 | Disposition: A | Discharge status: Home / Self Care | Payer: PPO | Source: Ambulatory Visit | Attending: Geriatric Medicine | Admitting: Geriatric Medicine

## 2017-05-17 DIAGNOSIS — R05 Cough: Secondary | ICD-10-CM | POA: Diagnosis not present

## 2017-05-17 DIAGNOSIS — J189 Pneumonia, unspecified organism: Secondary | ICD-10-CM

## 2017-05-18 ENCOUNTER — Telehealth: Payer: Self-pay | Admitting: *Deleted

## 2017-05-18 NOTE — Telephone Encounter (Signed)
Received fax from Dr Felipa Eth who informed that pt had been placed on Levaquin 250 mg daily for 7 days started on 05/17/2017 Spoke with pt and he states he has pneumonia Instructed to take coumadin as ordered and Levaquin as ordered and made an appt for him to be seen in coumadin clinic on Monday the 19th as there is an interaction between these 2 medications  Also instructed to eat couple servings of dark leafy greens between now and Monday and he states understanding

## 2017-05-19 ENCOUNTER — Other Ambulatory Visit: Payer: Self-pay | Admitting: Geriatric Medicine

## 2017-05-19 DIAGNOSIS — R918 Other nonspecific abnormal finding of lung field: Secondary | ICD-10-CM

## 2017-05-22 ENCOUNTER — Ambulatory Visit (INDEPENDENT_AMBULATORY_CARE_PROVIDER_SITE_OTHER): Payer: PPO | Admitting: *Deleted

## 2017-05-22 DIAGNOSIS — Z7901 Long term (current) use of anticoagulants: Secondary | ICD-10-CM

## 2017-05-22 DIAGNOSIS — I4891 Unspecified atrial fibrillation: Secondary | ICD-10-CM | POA: Diagnosis not present

## 2017-05-22 DIAGNOSIS — Z5181 Encounter for therapeutic drug level monitoring: Secondary | ICD-10-CM | POA: Diagnosis not present

## 2017-05-22 LAB — POCT INR: INR: 2.6

## 2017-05-22 NOTE — Patient Instructions (Signed)
Continue taking same dosage 1 tablet daily except 1/2 tablet on Mondays, Wednesdays, and Fridays.  Recheck INR in 6 weeks. Call with any new medications or if scheduled for any procedures  [phone]

## 2017-05-31 ENCOUNTER — Other Ambulatory Visit: Payer: Self-pay | Admitting: Geriatric Medicine

## 2017-05-31 ENCOUNTER — Ambulatory Visit
Admission: RE | Admit: 2017-05-31 | Discharge: 2017-05-31 | Disposition: A | Discharge status: Home / Self Care | Payer: PPO | Source: Ambulatory Visit | Attending: Geriatric Medicine | Admitting: Geriatric Medicine

## 2017-05-31 DIAGNOSIS — R918 Other nonspecific abnormal finding of lung field: Secondary | ICD-10-CM

## 2017-05-31 DIAGNOSIS — R911 Solitary pulmonary nodule: Secondary | ICD-10-CM | POA: Diagnosis not present

## 2017-05-31 DIAGNOSIS — G4733 Obstructive sleep apnea (adult) (pediatric): Secondary | ICD-10-CM | POA: Diagnosis not present

## 2017-06-03 IMAGING — CT CT RENAL STONE PROTOCOL
2 of 4 series · 9 of 46 positions shown, 10 images · non-contrast
Comparison: 06/14/2011

CLINICAL DATA: Acute renal failure.  Fever.  Possible sepsis.

EXAM:
CT ABDOMEN AND PELVIS WITHOUT CONTRAST
TECHNIQUE: Multidetector CT imaging of the abdomen and pelvis was performed
following the standard protocol without IV contrast.

[Series 201: stone study, idose (2) · axial · 0.78mm/px · z∈[-506,-131]mm · 6 of 95 slices shown, 7 images]
[im 12/95  soft-tissue]
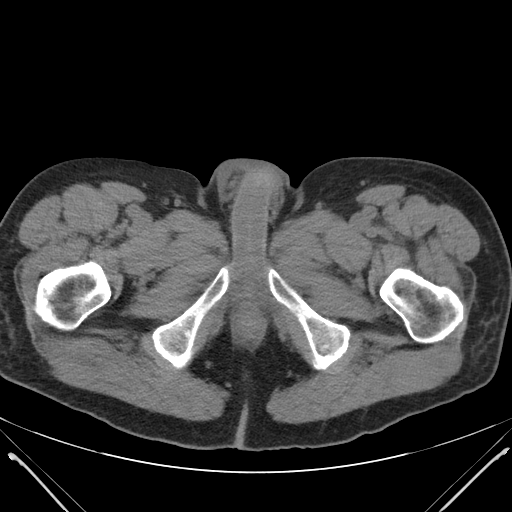
[im 12/95  bone]
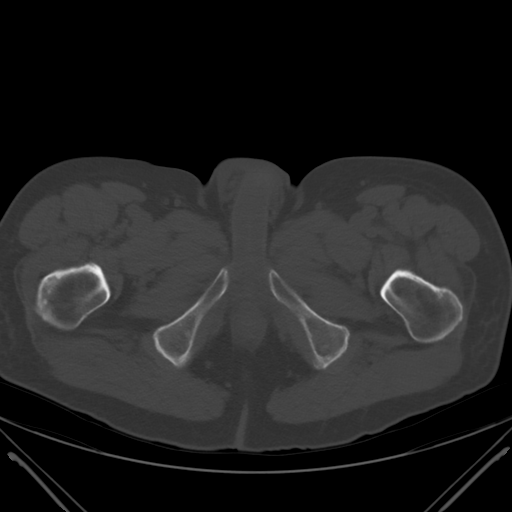
[im 27/95  soft-tissue]
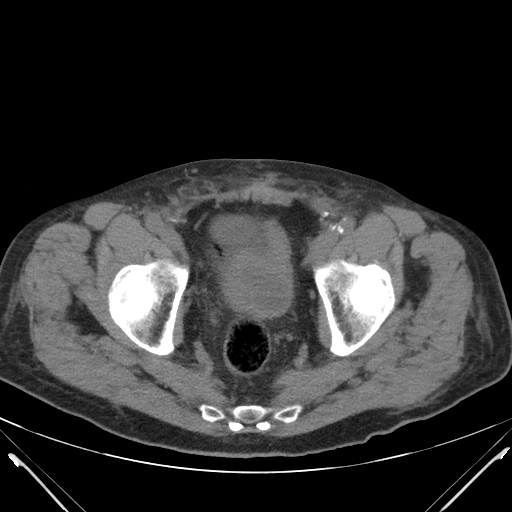
[im 42/95  soft-tissue]
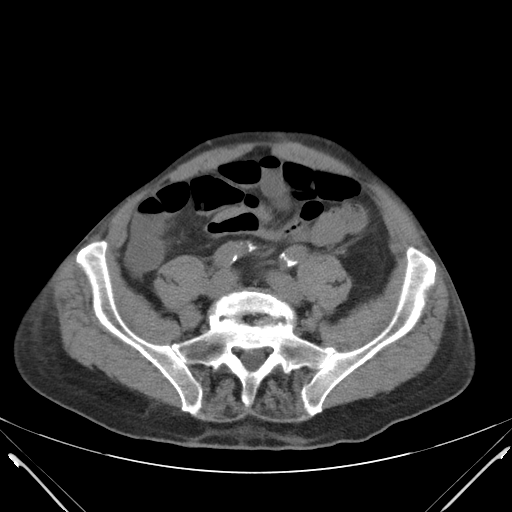
[im 57/95  soft-tissue]
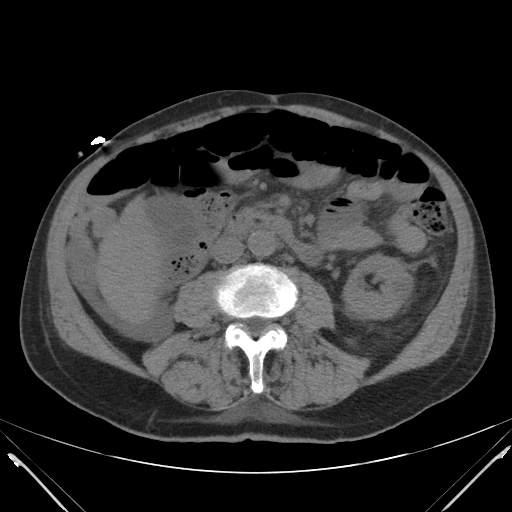
[im 72/95  soft-tissue]
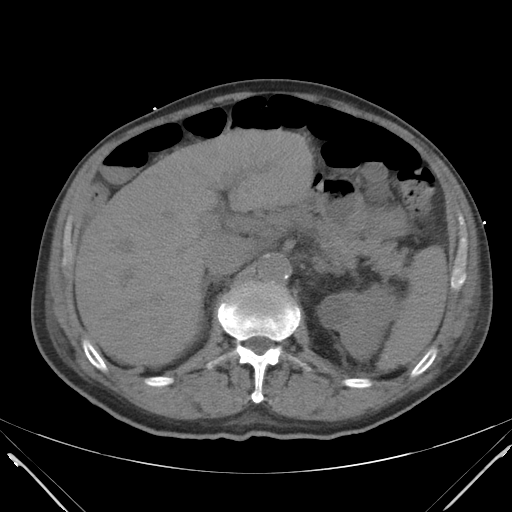
[im 87/95  soft-tissue]
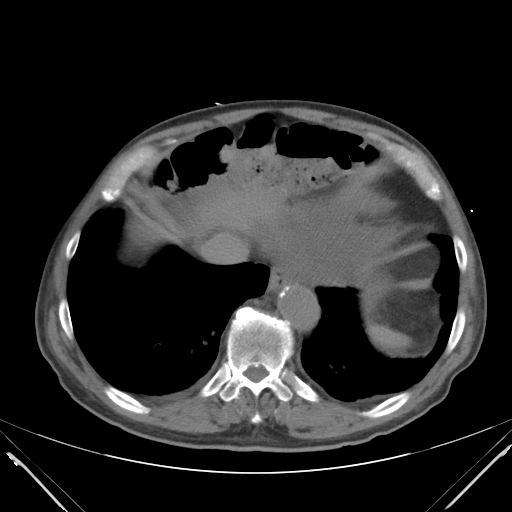

[Series 203: coronals, idose (2) · coronal · 0.45mm/px · 3 of 125 slices shown]
[im 42/125  soft-tissue]
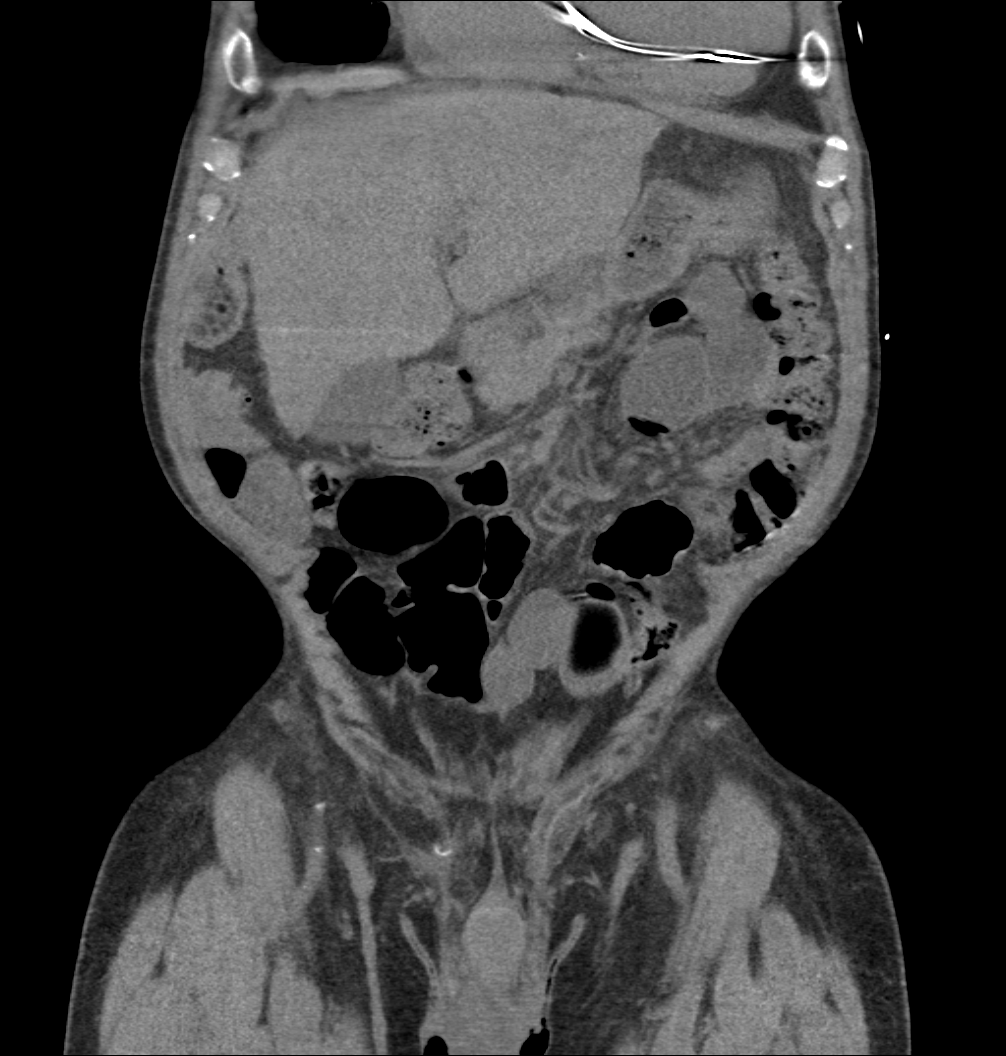
[im 56/125  soft-tissue]
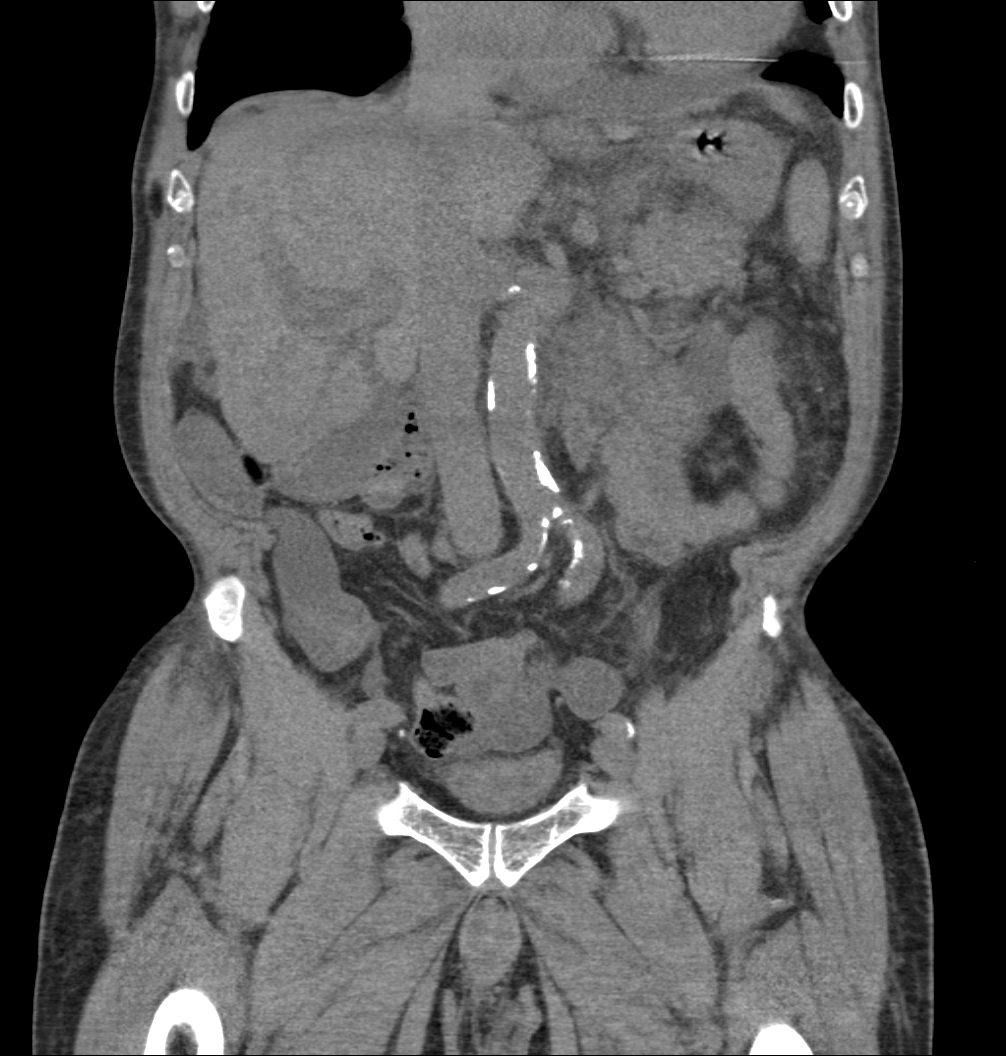
[im 69/125  soft-tissue]
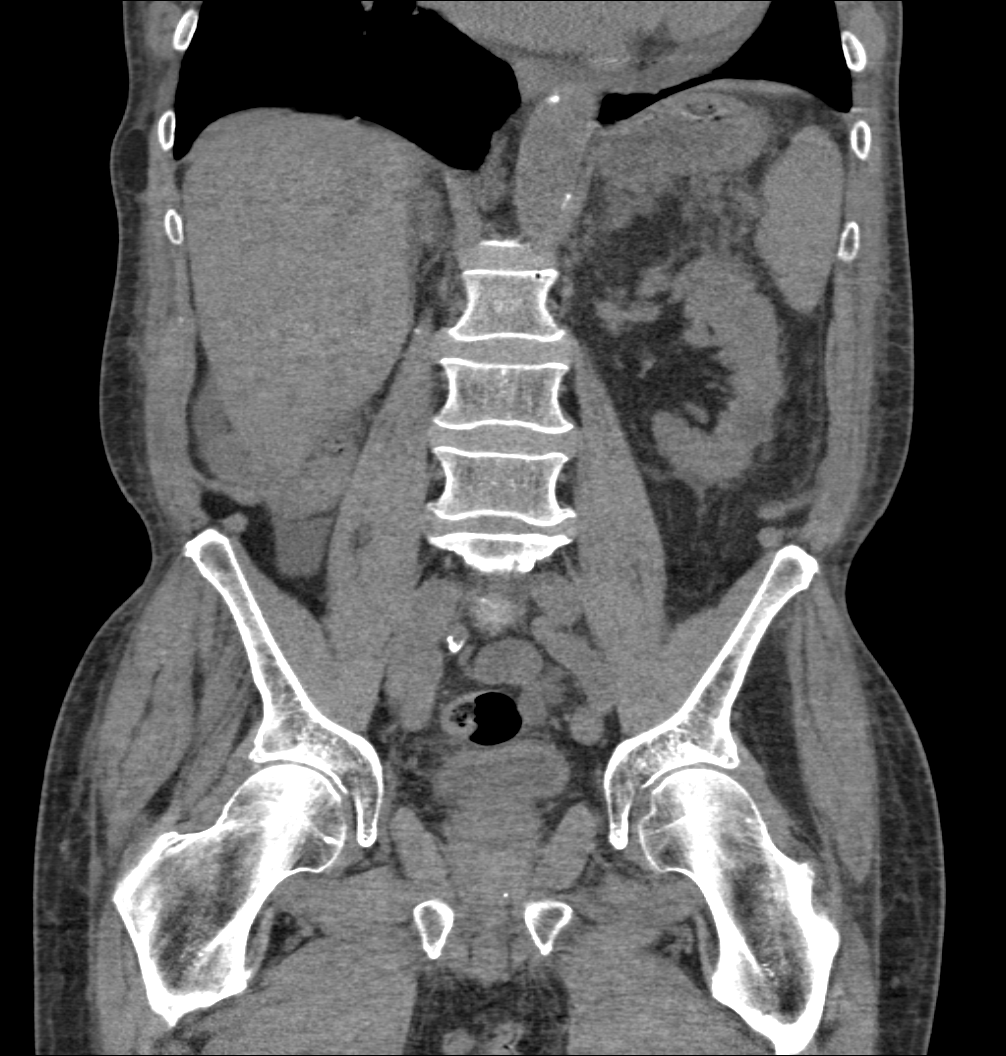

[9 of 46 positions shown; findings below may reference images not displayed]

FINDINGS: There are unremarkable unenhanced appearances of the liver,
gallbladder and bile ducts.

There are unremarkable unenhanced appearances of the pancreas,
spleen and adrenals except for a few splenic granulomata.

There are unremarkable appearances of the right nephrectomy bed.
Left kidney is negative for hydronephrosis. There is an upper pole
2.5 cm left renal cyst, decreased in size from 06/14/2011 when it
measured 5.9 cm. There also is a 2 cm cyst of the lateral aspect of
the left renal midpole, unchanged. The left collecting system and
ureter are unremarkable. Urinary bladder is nearly completely empty
but grossly unremarkable.

The abdominal aorta is normal in caliber. There is mild
atherosclerotic calcification. There is no adenopathy in the abdomen
or pelvis.

Bowel is unremarkable.

There is no adenopathy.  There is small volume perihepatic ascites.

No significant skeletal lesion. Moderate lumbar facet arthritis from
L4 through S1 with a grade 1 spondylolisthesis at L4-5.

Noncalcified 5.5 mm nodule in the posterior right lower lobe
periphery, axial image 4 series 205. This is indeterminate. Mild
stable scarring in the right middle lobe and lingular base. Mild
patchy atelectatic appearing opacity in the posterior lower lobes,
left greater than right. Trace left pleural effusion
IMPRESSION: 1. Unremarkable appearances of the right nephrectomy bed. Solitary
left kidney is notable only for benign cysts.
2. Small volume perihepatic ascites.
3. Indeterminate 5.5 mm nodule at the posterior periphery of the
right lower lobe. Mild atelectatic appearing opacity in the left
lower lobe posteriorly. Trace left pleural effusion. Follow-up
radiography or chest CT recommended, depending on level of clinical
suspicion.

## 2017-06-29 ENCOUNTER — Other Ambulatory Visit: Payer: Self-pay | Admitting: *Deleted

## 2017-06-29 MED ORDER — WARFARIN SODIUM 5 MG PO TABS: ORAL_TABLET | ORAL | 0 refills | Status: DC

## 2017-07-11 ENCOUNTER — Ambulatory Visit (INDEPENDENT_AMBULATORY_CARE_PROVIDER_SITE_OTHER): Payer: PPO | Admitting: *Deleted

## 2017-07-11 DIAGNOSIS — Z5181 Encounter for therapeutic drug level monitoring: Secondary | ICD-10-CM

## 2017-07-11 DIAGNOSIS — Z7901 Long term (current) use of anticoagulants: Secondary | ICD-10-CM | POA: Diagnosis not present

## 2017-07-11 DIAGNOSIS — I4891 Unspecified atrial fibrillation: Secondary | ICD-10-CM | POA: Diagnosis not present

## 2017-07-11 LAB — POCT INR: INR: 1.5

## 2017-07-11 NOTE — Patient Instructions (Signed)
Description   Today take 1.5 tablets and tomorrow take 1 tablet then continue taking same dosage 1 tablet daily except 1/2 tablet on Mondays, Wednesdays, and Fridays.  Recheck INR in 2 weeks.  Call with any new medications or if scheduled for any procedures  [phone]

## 2017-07-18 DIAGNOSIS — M19042 Primary osteoarthritis, left hand: Secondary | ICD-10-CM | POA: Insufficient documentation

## 2017-07-25 ENCOUNTER — Ambulatory Visit (INDEPENDENT_AMBULATORY_CARE_PROVIDER_SITE_OTHER): Payer: PPO | Admitting: *Deleted

## 2017-07-25 DIAGNOSIS — I4891 Unspecified atrial fibrillation: Secondary | ICD-10-CM | POA: Diagnosis not present

## 2017-07-25 DIAGNOSIS — Z7901 Long term (current) use of anticoagulants: Secondary | ICD-10-CM | POA: Diagnosis not present

## 2017-07-25 DIAGNOSIS — Z5181 Encounter for therapeutic drug level monitoring: Secondary | ICD-10-CM | POA: Diagnosis not present

## 2017-07-25 LAB — POCT INR: INR: 1.8

## 2017-07-25 NOTE — Patient Instructions (Signed)
Description   Change your dose to 1 tablet daily except 1/2 tablet on Mondays and Fridays.   Recheck INR in 2 weeks.  Call with any new medications or if scheduled for any procedures  [phone]

## 2017-07-26 DIAGNOSIS — Z85828 Personal history of other malignant neoplasm of skin: Secondary | ICD-10-CM | POA: Diagnosis not present

## 2017-07-26 DIAGNOSIS — D1801 Hemangioma of skin and subcutaneous tissue: Secondary | ICD-10-CM | POA: Diagnosis not present

## 2017-07-26 DIAGNOSIS — D044 Carcinoma in situ of skin of scalp and neck: Secondary | ICD-10-CM | POA: Diagnosis not present

## 2017-07-26 DIAGNOSIS — L308 Other specified dermatitis: Secondary | ICD-10-CM | POA: Diagnosis not present

## 2017-07-26 DIAGNOSIS — L570 Actinic keratosis: Secondary | ICD-10-CM | POA: Diagnosis not present

## 2017-07-26 DIAGNOSIS — L821 Other seborrheic keratosis: Secondary | ICD-10-CM | POA: Diagnosis not present

## 2017-07-26 DIAGNOSIS — D225 Melanocytic nevi of trunk: Secondary | ICD-10-CM | POA: Diagnosis not present

## 2017-07-26 DIAGNOSIS — L814 Other melanin hyperpigmentation: Secondary | ICD-10-CM | POA: Diagnosis not present

## 2017-07-27 ENCOUNTER — Telehealth: Payer: Self-pay | Admitting: *Deleted

## 2017-07-27 NOTE — Telephone Encounter (Signed)
Pt states he has been eating cucumber pickles 1 slice 2 times  a day  Instructed to just cut back to I slice daily and he also states has been taking Claritin instructed there is no interaction between coumadin and Claritin and he states understanding

## 2017-08-08 ENCOUNTER — Ambulatory Visit (INDEPENDENT_AMBULATORY_CARE_PROVIDER_SITE_OTHER): Payer: PPO | Admitting: Pharmacist

## 2017-08-08 DIAGNOSIS — I4891 Unspecified atrial fibrillation: Secondary | ICD-10-CM

## 2017-08-08 DIAGNOSIS — Z7901 Long term (current) use of anticoagulants: Secondary | ICD-10-CM

## 2017-08-08 DIAGNOSIS — Z5181 Encounter for therapeutic drug level monitoring: Secondary | ICD-10-CM | POA: Diagnosis not present

## 2017-08-08 LAB — POCT INR: INR: 1.9

## 2017-08-08 NOTE — Patient Instructions (Signed)
Description   Take 1.5 tablets today, then start taking 1 tablet daily except 1/2 tablet on Fridays.   Recheck INR in 3 weeks.  Call with any new medications or if scheduled for any procedures  [phone]

## 2017-08-31 ENCOUNTER — Ambulatory Visit: Payer: PPO | Admitting: Pharmacist

## 2017-08-31 ENCOUNTER — Encounter (INDEPENDENT_AMBULATORY_CARE_PROVIDER_SITE_OTHER): Payer: Self-pay

## 2017-08-31 DIAGNOSIS — Z5181 Encounter for therapeutic drug level monitoring: Secondary | ICD-10-CM

## 2017-08-31 DIAGNOSIS — I4891 Unspecified atrial fibrillation: Secondary | ICD-10-CM | POA: Diagnosis not present

## 2017-08-31 LAB — POCT INR: INR: 1.8

## 2017-08-31 NOTE — Patient Instructions (Signed)
Description   Today take 1.5 tablets then CHANGE dose to 5mg  (1 tablet) every day except 1/2 tablet on Mondays and Fridays.  Recheck INR in 2 weeks.  Call with any new medications or if scheduled for any procedures  [phone]

## 2017-09-15 ENCOUNTER — Ambulatory Visit (INDEPENDENT_AMBULATORY_CARE_PROVIDER_SITE_OTHER): Payer: PPO | Admitting: *Deleted

## 2017-09-15 DIAGNOSIS — I4891 Unspecified atrial fibrillation: Secondary | ICD-10-CM

## 2017-09-15 DIAGNOSIS — Z5181 Encounter for therapeutic drug level monitoring: Secondary | ICD-10-CM | POA: Diagnosis not present

## 2017-09-15 LAB — POCT INR: INR: 1.8

## 2017-09-15 NOTE — Patient Instructions (Signed)
Description   Today March 15th take 1 tablet (5mg )  then CHANGE dose to 5mg  (1 tablet) every day except 1/2 tablet (2.5mg )  only on  Mondays   Recheck INR in 2 weeks.  Call with any new medications or if scheduled for any procedures  [phone]

## 2017-09-21 ENCOUNTER — Other Ambulatory Visit: Payer: Self-pay | Admitting: *Deleted

## 2017-09-21 MED ORDER — WARFARIN SODIUM 5 MG PO TABS: ORAL_TABLET | ORAL | 0 refills | Status: DC

## 2017-09-26 DIAGNOSIS — Z85528 Personal history of other malignant neoplasm of kidney: Secondary | ICD-10-CM | POA: Diagnosis not present

## 2017-09-26 DIAGNOSIS — Z1389 Encounter for screening for other disorder: Secondary | ICD-10-CM | POA: Diagnosis not present

## 2017-09-26 DIAGNOSIS — Z862 Personal history of diseases of the blood and blood-forming organs and certain disorders involving the immune mechanism: Secondary | ICD-10-CM | POA: Diagnosis not present

## 2017-09-26 DIAGNOSIS — N400 Enlarged prostate without lower urinary tract symptoms: Secondary | ICD-10-CM | POA: Diagnosis not present

## 2017-09-26 DIAGNOSIS — G4733 Obstructive sleep apnea (adult) (pediatric): Secondary | ICD-10-CM | POA: Diagnosis not present

## 2017-09-26 DIAGNOSIS — Z7901 Long term (current) use of anticoagulants: Secondary | ICD-10-CM | POA: Diagnosis not present

## 2017-09-26 DIAGNOSIS — N183 Chronic kidney disease, stage 3 (moderate): Secondary | ICD-10-CM | POA: Diagnosis not present

## 2017-09-26 DIAGNOSIS — Z0000 Encounter for general adult medical examination without abnormal findings: Secondary | ICD-10-CM | POA: Diagnosis not present

## 2017-09-26 DIAGNOSIS — J40 Bronchitis, not specified as acute or chronic: Secondary | ICD-10-CM | POA: Diagnosis not present

## 2017-09-26 DIAGNOSIS — I272 Pulmonary hypertension, unspecified: Secondary | ICD-10-CM | POA: Diagnosis not present

## 2017-09-26 DIAGNOSIS — G3184 Mild cognitive impairment, so stated: Secondary | ICD-10-CM | POA: Diagnosis not present

## 2017-09-27 ENCOUNTER — Other Ambulatory Visit: Payer: Self-pay | Admitting: *Deleted

## 2017-09-27 ENCOUNTER — Ambulatory Visit: Payer: PPO | Admitting: *Deleted

## 2017-09-27 DIAGNOSIS — Z5181 Encounter for therapeutic drug level monitoring: Secondary | ICD-10-CM

## 2017-09-27 DIAGNOSIS — I4891 Unspecified atrial fibrillation: Secondary | ICD-10-CM | POA: Diagnosis not present

## 2017-09-27 LAB — POCT INR: INR: 2.2

## 2017-09-27 NOTE — Patient Instructions (Signed)
Description   Continue same  dose of coumadin  5mg  (1 tablet) every day except 1/2 tablet (2.5mg )  only on  Mondays   Recheck INR in 2 weeks.  Call with any new medications or if scheduled for any procedures  [phone]

## 2017-10-11 ENCOUNTER — Ambulatory Visit (INDEPENDENT_AMBULATORY_CARE_PROVIDER_SITE_OTHER): Payer: PPO | Admitting: *Deleted

## 2017-10-11 DIAGNOSIS — Z5181 Encounter for therapeutic drug level monitoring: Secondary | ICD-10-CM

## 2017-10-11 DIAGNOSIS — I4891 Unspecified atrial fibrillation: Secondary | ICD-10-CM

## 2017-10-11 LAB — POCT INR: INR: 2.5

## 2017-10-11 NOTE — Patient Instructions (Signed)
Description   Continue same dose of coumadin 5mg (1 tablet) every day except 1/2 tablet (2.5mg) only on  Mondays.  Recheck INR in 4 weeks.  Call with any new medications or if scheduled for any procedures  [phone]      

## 2017-10-12 DIAGNOSIS — G4733 Obstructive sleep apnea (adult) (pediatric): Secondary | ICD-10-CM | POA: Diagnosis not present

## 2017-10-13 DIAGNOSIS — N183 Chronic kidney disease, stage 3 (moderate): Secondary | ICD-10-CM | POA: Diagnosis not present

## 2017-10-13 DIAGNOSIS — I4891 Unspecified atrial fibrillation: Secondary | ICD-10-CM | POA: Diagnosis not present

## 2017-10-13 DIAGNOSIS — J209 Acute bronchitis, unspecified: Secondary | ICD-10-CM | POA: Diagnosis not present

## 2017-10-13 DIAGNOSIS — M152 Bouchard's nodes (with arthropathy): Secondary | ICD-10-CM | POA: Diagnosis not present

## 2017-10-13 DIAGNOSIS — M47816 Spondylosis without myelopathy or radiculopathy, lumbar region: Secondary | ICD-10-CM | POA: Diagnosis not present

## 2017-10-13 DIAGNOSIS — N400 Enlarged prostate without lower urinary tract symptoms: Secondary | ICD-10-CM | POA: Diagnosis not present

## 2017-10-17 ENCOUNTER — Telehealth: Payer: Self-pay | Admitting: *Deleted

## 2017-10-17 DIAGNOSIS — S46219A Strain of muscle, fascia and tendon of other parts of biceps, unspecified arm, initial encounter: Secondary | ICD-10-CM | POA: Diagnosis not present

## 2017-10-17 DIAGNOSIS — S46911A Strain of unspecified muscle, fascia and tendon at shoulder and upper arm level, right arm, initial encounter: Secondary | ICD-10-CM | POA: Diagnosis not present

## 2017-10-17 NOTE — Telephone Encounter (Signed)
Spoke with pt and he states when he awakened on Sunday Morning his right arm was edematous from shoulder to elbow. Concerned might be due to coumadin  Instructed that coumadin does not cause edema but he does need to see PCP today and if cannot see PCP today then needs to go to  Urgent Care Pt states he will do so and also instructed to call with any changes

## 2017-10-18 DIAGNOSIS — M25511 Pain in right shoulder: Secondary | ICD-10-CM | POA: Diagnosis not present

## 2017-10-30 DIAGNOSIS — G4733 Obstructive sleep apnea (adult) (pediatric): Secondary | ICD-10-CM | POA: Diagnosis not present

## 2017-11-01 DIAGNOSIS — G4733 Obstructive sleep apnea (adult) (pediatric): Secondary | ICD-10-CM | POA: Diagnosis not present

## 2017-11-07 ENCOUNTER — Other Ambulatory Visit: Payer: Self-pay

## 2017-11-07 ENCOUNTER — Ambulatory Visit: Payer: PPO | Admitting: Podiatry

## 2017-11-07 VITALS — BP 144/78 | HR 72

## 2017-11-07 DIAGNOSIS — Q828 Other specified congenital malformations of skin: Secondary | ICD-10-CM

## 2017-11-07 NOTE — Progress Notes (Signed)
Subjective:  Patient ID: [name], male    DOB: [birth year],  MRN: [id number] HPI Chief Complaint  Patient presents with  . Callouses    right foot ball of foot; pt stated, "has a bump on the bottom of my foot thats been being growing for about a year now; hard to walk sometimes"    84 y.o. male presents with the above complaint.   Review of systems: Denies fever chills nausea vomiting muscle aches pains calf pain chest pain shortness of breath headache.  Past Medical History:  Diagnosis Date  . Allergic rhinitis   . Arthritis   . BPH (benign prostatic hyperplasia)   . Bronchitis   . Complete heart block ([place])    pacemaker originally placed in 1988  . Dementia   . Esophageal reflux   . Hypertension   . Lung nodule    noted on CXR December 2012  . Normal nuclear stress test 2013  . OSA (obstructive sleep apnea)    uses cpap  . Permanent atrial fibrillation (H[facility])    on coumadin  . Pneumonia   . Presence of permanent cardiac pacemaker   . Renal insufficiency    Rt kidney removed d/t maglinant tumor  . RLS (restless legs syndrome)   . Stroke [facility])    TIA greater than 20 years  . Transitional cell carcinoma ([place])    s/p nephrectomy in  2010   Past Surgical History:  Procedure Laterality Date  . CARDIOVASCULAR STRESS TEST  03/23/2009   EF 65%, NORMAL  . CATARACT EXTRACTION W/PHACO Left 08/26/2015   Procedure: CATARACT EXTRACTION PHACO AND INTRAOCULAR LENS PLACEMENT (IOC);  Surgeon: Marylynn Pearson, MD;  Location: [place];  Service: Ophthalmology;  Laterality: Left;  . DOPPLER ECHOCARDIOGRAPHY  03/26/2001   EF 55%  . EYE SURGERY     tumor removed from Rt eye lid  . HERNIA REPAIR     multiple  . MASS EXCISION Left 11/07/2012   Procedure: MINOR EXCISION OF MASS;  Surgeon: Haywood Lasso, MD;  Location: [place];  Service: General;  Laterality: Left;  . NEPHRECTOMY  2010   rt  . PACEMAKER INSERTION  1988   most recent generator change by Dr Rayann Heman  20067/2/13 with new right ventricular lead placed  . PERMANENT PACEMAKER GENERATOR CHANGE N/A 01/03/2012   Procedure: PERMANENT PACEMAKER GENERATOR CHANGE;  Surgeon: Thompson Grayer, MD;  Location: [facility] CATH LAB;  Service: Cardiovascular;  Laterality: N/A;  . US ECHOCARDIOGRAPHY  04/22/2008   EF 55-60%    Strange Outpatient Medications:  .  acetaminophen (TYLENOL) 325 MG tablet, Take 650 mg by mouth daily as needed for mild pain or fever., Disp: , Rfl:  .  acetaminophen-codeine (TYLENOL #3) 300-30 MG tablet, Take 1 tablet by mouth every 4 (four) hours as needed. for pain, Disp: , Rfl: 0 .  clonazePAM (KLONOPIN) 2 MG tablet, Take 1 tablet (2 mg total) by mouth at bedtime., Disp: 90 tablet, Rfl: 1 .  cycloSPORINE (RESTASIS) 0.05 % ophthalmic emulsion, Place 1 drop into both eyes 2 (two) times daily. , Disp: , Rfl:  .  doxazosin (CARDURA) 2 MG tablet, Take 2 mg by mouth daily., Disp: , Rfl:  .  finasteride (PROSCAR) 5 MG tablet, Take 5 mg by mouth daily., Disp: , Rfl:  .  furosemide (LASIX) 40 MG tablet, Take 1 tablet (40 mg total) by mouth daily., Disp: 90 tablet, Rfl: 3 .  galantamine (RAZADYNE) 8 MG tablet, Take 8 mg by  mouth 2 (two) times daily., Disp: , Rfl:  .  glucosamine-chondroitin 500-400 MG tablet, Take 2 tablets by mouth every evening., Disp: , Rfl:  .  hydroxypropyl methylcellulose (ISOPTO TEARS) 2.5 % ophthalmic solution, Place 1 drop into both eyes 4 (four) times daily as needed for dry eyes. , Disp: , Rfl:  .  ipratropium (ATROVENT) 0.06 % nasal spray, Place 2 sprays into both nostrils 2 (two) times daily., Disp: , Rfl:  .  metoprolol tartrate (LOPRESSOR) 25 MG tablet, Take 25 mg by mouth 2 (two) times daily., Disp: , Rfl:  .  Multiple Vitamin (MULTIVITAMIN) capsule, Take 1 capsule by mouth daily.  , Disp: , Rfl:  .  OVER THE COUNTER MEDICATION, Apply 1 application topically at bedtime. Eye gel, Disp: , Rfl:  .  warfarin (COUMADIN) 5 MG tablet, Take as directed by coumadin clinic, Disp:  90 tablet, Rfl: 0 .  aspirin 81 MG tablet, Take 81 mg by mouth every morning. , Disp: , Rfl:   No Known Allergies Review of Systems Objective:   Vitals:   11/07/17 1333  BP: (!) 144/78  Pulse: 72    General: Well developed, nourished, in no acute distress, alert and oriented x3   Dermatological: Skin is warm, dry and supple bilateral. Nails x 10 are well maintained; remaining integument appears unremarkable at this time. There are no open sores, no preulcerative lesions, no rash or signs of infection present.  Porokeratotic lesion sub-second metatarsal phalangeal joint.  Vascular: Dorsalis Pedis artery and Posterior Tibial artery pedal pulses are 2/4 bilateral with immedate capillary fill time. Pedal hair growth present. No varicosities and no lower extremity edema present bilateral.   Neruologic: Grossly intact via light touch bilateral. Vibratory intact via tuning fork bilateral. Protective threshold with Semmes Wienstein monofilament intact to all pedal sites bilateral. Patellar and Achilles deep tendon reflexes 2+ bilateral. No Babinski or clonus noted bilateral.   Musculoskeletal: No gross boney pedal deformities bilateral. No pain, crepitus, or limitation noted with foot and ankle range of motion bilateral. Muscular strength 5/5 in all groups tested bilateral.  Hammertoe deformities lesser metatarsal phalangeal joints  Gait: Unassisted, Nonantalgic.    Radiographs:  None taken  Assessment & Plan:   Assessment: Porokeratotic lesion with hammertoe deformity second metatarsal phalangeal joint right foot.   Plan: Mechanical debridement today salicylic acid placed under occlusion to be left on for 3 days and then washed off thoroughly.  I will follow-up with him in 6 weeks for reapplication.     Evella Kasal T. [place], [place]

## 2017-11-08 ENCOUNTER — Ambulatory Visit (INDEPENDENT_AMBULATORY_CARE_PROVIDER_SITE_OTHER): Payer: PPO | Admitting: Pharmacist

## 2017-11-08 DIAGNOSIS — I4891 Unspecified atrial fibrillation: Secondary | ICD-10-CM | POA: Diagnosis not present

## 2017-11-08 DIAGNOSIS — Z5181 Encounter for therapeutic drug level monitoring: Secondary | ICD-10-CM | POA: Diagnosis not present

## 2017-11-08 LAB — POCT INR: INR: 2.5

## 2017-11-08 NOTE — Patient Instructions (Signed)
Description   Continue same  dose of coumadin 5mg  (1 tablet) every day except 1/2 tablet (2.5mg ) only on  Mondays   Recheck INR in 6 weeks.  Call with any new medications or if scheduled for any procedures  [phone]

## 2017-11-13 DIAGNOSIS — M19042 Primary osteoarthritis, left hand: Secondary | ICD-10-CM | POA: Diagnosis not present

## 2017-11-29 ENCOUNTER — Ambulatory Visit: Payer: Self-pay | Admitting: Orthopedic Surgery

## 2017-11-29 ENCOUNTER — Inpatient Hospital Stay (HOSPITAL_COMMUNITY)
Admission: EM | Admit: 2017-11-29 | Discharge: 2017-12-09 | DRG: 470 | Disposition: A | Discharge status: Skilled Nursing Facility | Payer: PPO | Attending: Internal Medicine | Admitting: Internal Medicine

## 2017-11-29 ENCOUNTER — Inpatient Hospital Stay (HOSPITAL_COMMUNITY): Payer: PPO

## 2017-11-29 ENCOUNTER — Emergency Department (HOSPITAL_COMMUNITY): Payer: PPO

## 2017-11-29 ENCOUNTER — Other Ambulatory Visit: Payer: Self-pay | Admitting: Orthopedic Surgery

## 2017-11-29 ENCOUNTER — Other Ambulatory Visit: Payer: Self-pay

## 2017-11-29 ENCOUNTER — Encounter (HOSPITAL_COMMUNITY): Payer: Self-pay | Admitting: *Deleted

## 2017-11-29 DIAGNOSIS — I639 Cerebral infarction, unspecified: Secondary | ICD-10-CM | POA: Diagnosis present

## 2017-11-29 DIAGNOSIS — I482 Chronic atrial fibrillation: Secondary | ICD-10-CM | POA: Diagnosis present

## 2017-11-29 DIAGNOSIS — R600 Localized edema: Secondary | ICD-10-CM | POA: Diagnosis not present

## 2017-11-29 DIAGNOSIS — Z85528 Personal history of other malignant neoplasm of kidney: Secondary | ICD-10-CM

## 2017-11-29 DIAGNOSIS — W19XXXA Unspecified fall, initial encounter: Secondary | ICD-10-CM | POA: Diagnosis not present

## 2017-11-29 DIAGNOSIS — S5011XA Contusion of right forearm, initial encounter: Secondary | ICD-10-CM | POA: Diagnosis not present

## 2017-11-29 DIAGNOSIS — Y92009 Unspecified place in unspecified non-institutional (private) residence as the place of occurrence of the external cause: Secondary | ICD-10-CM | POA: Diagnosis not present

## 2017-11-29 DIAGNOSIS — I351 Nonrheumatic aortic (valve) insufficiency: Secondary | ICD-10-CM | POA: Diagnosis not present

## 2017-11-29 DIAGNOSIS — G2581 Restless legs syndrome: Secondary | ICD-10-CM | POA: Diagnosis present

## 2017-11-29 DIAGNOSIS — Z96642 Presence of left artificial hip joint: Secondary | ICD-10-CM | POA: Diagnosis not present

## 2017-11-29 DIAGNOSIS — S72002D Fracture of unspecified part of neck of left femur, subsequent encounter for closed fracture with routine healing: Secondary | ICD-10-CM | POA: Diagnosis not present

## 2017-11-29 DIAGNOSIS — S72012A Unspecified intracapsular fracture of left femur, initial encounter for closed fracture: Secondary | ICD-10-CM | POA: Diagnosis not present

## 2017-11-29 DIAGNOSIS — Z950 Presence of cardiac pacemaker: Secondary | ICD-10-CM | POA: Diagnosis not present

## 2017-11-29 DIAGNOSIS — Z7982 Long term (current) use of aspirin: Secondary | ICD-10-CM

## 2017-11-29 DIAGNOSIS — S7292XD Unspecified fracture of left femur, subsequent encounter for closed fracture with routine healing: Secondary | ICD-10-CM | POA: Diagnosis not present

## 2017-11-29 DIAGNOSIS — I129 Hypertensive chronic kidney disease with stage 1 through stage 4 chronic kidney disease, or unspecified chronic kidney disease: Secondary | ICD-10-CM | POA: Diagnosis present

## 2017-11-29 DIAGNOSIS — I4891 Unspecified atrial fibrillation: Secondary | ICD-10-CM | POA: Diagnosis not present

## 2017-11-29 DIAGNOSIS — N183 Chronic kidney disease, stage 3 unspecified: Secondary | ICD-10-CM | POA: Diagnosis present

## 2017-11-29 DIAGNOSIS — R2689 Other abnormalities of gait and mobility: Secondary | ICD-10-CM | POA: Diagnosis not present

## 2017-11-29 DIAGNOSIS — N179 Acute kidney failure, unspecified: Secondary | ICD-10-CM | POA: Diagnosis not present

## 2017-11-29 DIAGNOSIS — S72002A Fracture of unspecified part of neck of left femur, initial encounter for closed fracture: Secondary | ICD-10-CM | POA: Diagnosis present

## 2017-11-29 DIAGNOSIS — R197 Diarrhea, unspecified: Secondary | ICD-10-CM | POA: Diagnosis present

## 2017-11-29 DIAGNOSIS — Z79899 Other long term (current) drug therapy: Secondary | ICD-10-CM

## 2017-11-29 DIAGNOSIS — N400 Enlarged prostate without lower urinary tract symptoms: Secondary | ICD-10-CM | POA: Diagnosis present

## 2017-11-29 DIAGNOSIS — Z471 Aftercare following joint replacement surgery: Secondary | ICD-10-CM | POA: Diagnosis not present

## 2017-11-29 DIAGNOSIS — Z9989 Dependence on other enabling machines and devices: Secondary | ICD-10-CM

## 2017-11-29 DIAGNOSIS — S72001A Fracture of unspecified part of neck of right femur, initial encounter for closed fracture: Secondary | ICD-10-CM | POA: Diagnosis present

## 2017-11-29 DIAGNOSIS — S50312A Abrasion of left elbow, initial encounter: Secondary | ICD-10-CM | POA: Diagnosis not present

## 2017-11-29 DIAGNOSIS — D62 Acute posthemorrhagic anemia: Secondary | ICD-10-CM

## 2017-11-29 DIAGNOSIS — K529 Noninfective gastroenteritis and colitis, unspecified: Secondary | ICD-10-CM | POA: Diagnosis not present

## 2017-11-29 DIAGNOSIS — D539 Nutritional anemia, unspecified: Secondary | ICD-10-CM | POA: Diagnosis not present

## 2017-11-29 DIAGNOSIS — G4733 Obstructive sleep apnea (adult) (pediatric): Secondary | ICD-10-CM | POA: Diagnosis present

## 2017-11-29 DIAGNOSIS — K219 Gastro-esophageal reflux disease without esophagitis: Secondary | ICD-10-CM | POA: Diagnosis not present

## 2017-11-29 DIAGNOSIS — M6281 Muscle weakness (generalized): Secondary | ICD-10-CM | POA: Diagnosis not present

## 2017-11-29 DIAGNOSIS — Z8673 Personal history of transient ischemic attack (TIA), and cerebral infarction without residual deficits: Secondary | ICD-10-CM

## 2017-11-29 DIAGNOSIS — W1830XA Fall on same level, unspecified, initial encounter: Secondary | ICD-10-CM | POA: Diagnosis present

## 2017-11-29 DIAGNOSIS — F0390 Unspecified dementia without behavioral disturbance: Secondary | ICD-10-CM | POA: Diagnosis present

## 2017-11-29 DIAGNOSIS — R52 Pain, unspecified: Secondary | ICD-10-CM | POA: Diagnosis not present

## 2017-11-29 DIAGNOSIS — M1990 Unspecified osteoarthritis, unspecified site: Secondary | ICD-10-CM | POA: Diagnosis not present

## 2017-11-29 DIAGNOSIS — F419 Anxiety disorder, unspecified: Secondary | ICD-10-CM | POA: Diagnosis not present

## 2017-11-29 DIAGNOSIS — K5900 Constipation, unspecified: Secondary | ICD-10-CM | POA: Diagnosis not present

## 2017-11-29 DIAGNOSIS — I442 Atrioventricular block, complete: Secondary | ICD-10-CM | POA: Diagnosis not present

## 2017-11-29 DIAGNOSIS — I959 Hypotension, unspecified: Secondary | ICD-10-CM | POA: Diagnosis not present

## 2017-11-29 DIAGNOSIS — D649 Anemia, unspecified: Secondary | ICD-10-CM | POA: Diagnosis not present

## 2017-11-29 DIAGNOSIS — M25552 Pain in left hip: Secondary | ICD-10-CM | POA: Diagnosis not present

## 2017-11-29 DIAGNOSIS — M2550 Pain in unspecified joint: Secondary | ICD-10-CM | POA: Diagnosis not present

## 2017-11-29 DIAGNOSIS — I9581 Postprocedural hypotension: Secondary | ICD-10-CM | POA: Diagnosis not present

## 2017-11-29 DIAGNOSIS — Z7901 Long term (current) use of anticoagulants: Secondary | ICD-10-CM | POA: Diagnosis not present

## 2017-11-29 DIAGNOSIS — G8918 Other acute postprocedural pain: Secondary | ICD-10-CM

## 2017-11-29 DIAGNOSIS — Z419 Encounter for procedure for purposes other than remedying health state, unspecified: Secondary | ICD-10-CM

## 2017-11-29 DIAGNOSIS — R296 Repeated falls: Secondary | ICD-10-CM | POA: Diagnosis not present

## 2017-11-29 DIAGNOSIS — Z7401 Bed confinement status: Secondary | ICD-10-CM | POA: Diagnosis not present

## 2017-11-29 DIAGNOSIS — Z9181 History of falling: Secondary | ICD-10-CM | POA: Diagnosis not present

## 2017-11-29 DIAGNOSIS — S72042A Displaced fracture of base of neck of left femur, initial encounter for closed fracture: Secondary | ICD-10-CM | POA: Diagnosis not present

## 2017-11-29 DIAGNOSIS — F0391 Unspecified dementia with behavioral disturbance: Secondary | ICD-10-CM | POA: Diagnosis present

## 2017-11-29 DIAGNOSIS — F03918 Unspecified dementia, unspecified severity, with other behavioral disturbance: Secondary | ICD-10-CM | POA: Diagnosis present

## 2017-11-29 DIAGNOSIS — R609 Edema, unspecified: Secondary | ICD-10-CM | POA: Diagnosis not present

## 2017-11-29 DIAGNOSIS — I10 Essential (primary) hypertension: Secondary | ICD-10-CM | POA: Diagnosis present

## 2017-11-29 DIAGNOSIS — W19XXXD Unspecified fall, subsequent encounter: Secondary | ICD-10-CM | POA: Diagnosis not present

## 2017-11-29 DIAGNOSIS — Z111 Encounter for screening for respiratory tuberculosis: Secondary | ICD-10-CM | POA: Diagnosis not present

## 2017-11-29 DIAGNOSIS — M79605 Pain in left leg: Secondary | ICD-10-CM | POA: Diagnosis not present

## 2017-11-29 DIAGNOSIS — D509 Iron deficiency anemia, unspecified: Secondary | ICD-10-CM | POA: Diagnosis not present

## 2017-11-29 DIAGNOSIS — I340 Nonrheumatic mitral (valve) insufficiency: Secondary | ICD-10-CM | POA: Diagnosis not present

## 2017-11-29 DIAGNOSIS — H04129 Dry eye syndrome of unspecified lacrimal gland: Secondary | ICD-10-CM | POA: Diagnosis not present

## 2017-11-29 DIAGNOSIS — Z905 Acquired absence of kidney: Secondary | ICD-10-CM

## 2017-11-29 LAB — COMPREHENSIVE METABOLIC PANEL
ALT: 16 U/L — ABNORMAL LOW (ref 17–63)
AST: 21 U/L (ref 15–41)
Albumin: 3.6 g/dL (ref 3.5–5.0)
Alkaline Phosphatase: 102 U/L (ref 38–126)
Anion gap: 8 (ref 5–15)
BUN: 32 mg/dL — ABNORMAL HIGH (ref 6–20)
CHLORIDE: 109 mmol/L (ref 101–111)
CO2: 27 mmol/L (ref 22–32)
Calcium: 9.4 mg/dL (ref 8.9–10.3)
Creatinine, Ser: 1.58 mg/dL — ABNORMAL HIGH (ref 0.61–1.24)
GFR calc non Af Amer: 38 mL/min — ABNORMAL LOW (ref >60)
GFR, EST AFRICAN AMERICAN: 44 mL/min — AB (ref >60)
Glucose, Bld: 101 mg/dL — ABNORMAL HIGH (ref 65–99)
POTASSIUM: 4.3 mmol/L (ref 3.5–5.1)
Sodium: 144 mmol/L (ref 135–145)
Total Bilirubin: 0.9 mg/dL (ref 0.3–1.2)
Total Protein: 6.8 g/dL (ref 6.5–8.1)

## 2017-11-29 LAB — CBC WITH DIFFERENTIAL/PLATELET
Abs Immature Granulocytes: 0 10*3/uL (ref 0.0–0.1)
Basophils Absolute: 0 10*3/uL (ref 0.0–0.1)
Basophils Relative: 1 %
EOS ABS: 0.1 10*3/uL (ref 0.0–0.7)
Eosinophils Relative: 2 %
HEMATOCRIT: 37.2 % — AB (ref 39.0–52.0)
HEMOGLOBIN: 11.5 g/dL — AB (ref 13.0–17.0)
IMMATURE GRANULOCYTES: 0 %
LYMPHS ABS: 1.4 10*3/uL (ref 0.7–4.0)
LYMPHS PCT: 29 %
MCH: 31.5 pg (ref 26.0–34.0)
MCHC: 30.9 g/dL (ref 30.0–36.0)
MCV: 101.9 fL — ABNORMAL HIGH (ref 78.0–100.0)
Monocytes Absolute: 0.5 10*3/uL (ref 0.1–1.0)
Monocytes Relative: 11 %
NEUTROS ABS: 2.7 10*3/uL (ref 1.7–7.7)
NEUTROS PCT: 57 %
Platelets: 177 10*3/uL (ref 150–400)
RBC: 3.65 MIL/uL — AB (ref 4.22–5.81)
RDW: 13.4 % (ref 11.5–15.5)
WBC: 4.8 10*3/uL (ref 4.0–10.5)

## 2017-11-29 LAB — PROTIME-INR
INR: 2.85
Prothrombin Time: 29.7 seconds — ABNORMAL HIGH (ref 11.4–15.2)

## 2017-11-29 MED ORDER — PHYTONADIONE 5 MG PO TABS
10.0000 mg | ORAL_TABLET | Freq: Once | ORAL | Status: AC
  Administered 2017-11-29: 10 mg via ORAL
  Filled 2017-11-29: qty 2

## 2017-11-29 MED ORDER — ACETAMINOPHEN 325 MG PO TABS: 650.0000 mg | ORAL_TABLET | Freq: Every day | ORAL | Status: DC | PRN

## 2017-11-29 MED ORDER — HEPARIN SODIUM (PORCINE) 5000 UNIT/ML IJ SOLN: 5000.0000 [IU] | Freq: Once | INTRAMUSCULAR | Status: DC

## 2017-11-29 MED ORDER — METOPROLOL TARTRATE 25 MG PO TABS
25.0000 mg | ORAL_TABLET | Freq: Two times a day (BID) | ORAL | Status: DC
  Administered 2017-11-30 – 2017-12-09 (×17): 25 mg via ORAL
  Filled 2017-11-29 (×5): qty 1
  Filled 2017-11-29: qty 2
  Filled 2017-11-29 (×14): qty 1

## 2017-11-29 MED ORDER — POLYVINYL ALCOHOL 1.4 % OP SOLN
1.0000 [drp] | Freq: Four times a day (QID) | OPHTHALMIC | Status: DC | PRN
  Administered 2017-12-03: 1 [drp] via OPHTHALMIC
  Filled 2017-11-29: qty 15

## 2017-11-29 MED ORDER — LOPERAMIDE HCL 2 MG PO CAPS
2.0000 mg | ORAL_CAPSULE | ORAL | Status: DC | PRN
Start: 2017-11-29 — End: 2017-12-09
  Administered 2017-11-30 – 2017-12-01 (×2): 2 mg via ORAL
  Filled 2017-11-29 (×2): qty 1

## 2017-11-29 MED ORDER — MORPHINE SULFATE (PF) 4 MG/ML IV SOLN
1.0000 mg | INTRAVENOUS | Status: DC | PRN
  Administered 2017-11-29 – 2017-11-30 (×2): 1 mg via INTRAVENOUS
  Filled 2017-11-29 (×3): qty 1

## 2017-11-29 MED ORDER — OXYCODONE-ACETAMINOPHEN 5-325 MG PO TABS
1.0000 | ORAL_TABLET | ORAL | Status: DC | PRN
  Administered 2017-11-29 – 2017-12-03 (×7): 1 via ORAL
  Filled 2017-11-29 (×10): qty 1

## 2017-11-29 MED ORDER — IPRATROPIUM BROMIDE 0.06 % NA SOLN
2.0000 | Freq: Two times a day (BID) | NASAL | Status: DC
  Administered 2017-11-30 – 2017-12-09 (×17): 2 via NASAL
  Filled 2017-11-29: qty 15

## 2017-11-29 MED ORDER — FUROSEMIDE 40 MG PO TABS
40.0000 mg | ORAL_TABLET | Freq: Every day | ORAL | Status: DC
  Administered 2017-11-30 – 2017-12-02 (×3): 40 mg via ORAL
  Filled 2017-11-29 (×3): qty 1

## 2017-11-29 MED ORDER — GLUCOSAMINE-CHONDROITIN 500-400 MG PO TABS: 2.0000 | ORAL_TABLET | Freq: Every evening | ORAL | Status: DC

## 2017-11-29 MED ORDER — ONDANSETRON HCL 4 MG/2ML IJ SOLN: 4.0000 mg | Freq: Three times a day (TID) | INTRAMUSCULAR | Status: DC | PRN

## 2017-11-29 MED ORDER — ASPIRIN 81 MG PO CHEW
81.0000 mg | CHEWABLE_TABLET | ORAL | Status: DC
  Administered 2017-12-02 – 2017-12-06 (×5): 81 mg via ORAL
  Filled 2017-11-29 (×5): qty 1

## 2017-11-29 MED ORDER — CEFAZOLIN SODIUM-DEXTROSE 2-4 GM/100ML-% IV SOLN
2.0000 g | INTRAVENOUS | Status: AC
  Filled 2017-11-29: qty 100

## 2017-11-29 MED ORDER — ZOLPIDEM TARTRATE 5 MG PO TABS
5.0000 mg | ORAL_TABLET | Freq: Every evening | ORAL | Status: DC | PRN
  Filled 2017-11-29: qty 1

## 2017-11-29 MED ORDER — DOXAZOSIN MESYLATE 2 MG PO TABS
2.0000 mg | ORAL_TABLET | Freq: Every day | ORAL | Status: DC
  Administered 2017-11-30 – 2017-12-06 (×7): 2 mg via ORAL
  Filled 2017-11-29 (×8): qty 1

## 2017-11-29 MED ORDER — ADULT MULTIVITAMIN W/MINERALS CH
1.0000 | ORAL_TABLET | Freq: Every day | ORAL | Status: DC
  Administered 2017-11-30 – 2017-12-09 (×10): 1 via ORAL
  Filled 2017-11-29 (×10): qty 1

## 2017-11-29 MED ORDER — POLYETHYLENE GLYCOL 3350 17 G PO PACK: 17.0000 g | PACK | Freq: Every day | ORAL | Status: DC | PRN

## 2017-11-29 MED ORDER — MORPHINE SULFATE (PF) 4 MG/ML IV SOLN
4.0000 mg | Freq: Once | INTRAVENOUS | Status: AC
  Administered 2017-11-29: 4 mg via INTRAVENOUS
  Filled 2017-11-29: qty 1

## 2017-11-29 MED ORDER — CLONAZEPAM 1 MG PO TABS
2.0000 mg | ORAL_TABLET | Freq: Every day | ORAL | Status: DC
  Administered 2017-11-30 – 2017-12-08 (×9): 2 mg via ORAL
  Filled 2017-11-29: qty 4
  Filled 2017-11-29 (×8): qty 2

## 2017-11-29 MED ORDER — GALANTAMINE HYDROBROMIDE 4 MG PO TABS
8.0000 mg | ORAL_TABLET | Freq: Two times a day (BID) | ORAL | Status: DC
  Administered 2017-11-30 – 2017-12-08 (×16): 8 mg via ORAL
  Filled 2017-11-29 (×21): qty 2

## 2017-11-29 MED ORDER — HYDRALAZINE HCL 20 MG/ML IJ SOLN: 5.0000 mg | INTRAMUSCULAR | Status: DC | PRN

## 2017-11-29 MED ORDER — ACETAMINOPHEN 325 MG PO TABS: 650.0000 mg | ORAL_TABLET | Freq: Once | ORAL | Status: DC

## 2017-11-29 MED ORDER — CYCLOSPORINE 0.05 % OP EMUL
1.0000 [drp] | Freq: Two times a day (BID) | OPHTHALMIC | Status: DC
  Administered 2017-11-30 – 2017-12-04 (×9): 1 [drp] via OPHTHALMIC
  Filled 2017-11-29 (×11): qty 1

## 2017-11-29 MED ORDER — FINASTERIDE 5 MG PO TABS
5.0000 mg | ORAL_TABLET | Freq: Every day | ORAL | Status: DC
  Administered 2017-11-30 – 2017-12-09 (×10): 5 mg via ORAL
  Filled 2017-11-29 (×10): qty 1

## 2017-11-29 MED ORDER — METHOCARBAMOL 500 MG PO TABS
500.0000 mg | ORAL_TABLET | Freq: Three times a day (TID) | ORAL | Status: DC | PRN
  Administered 2017-11-29 – 2017-12-09 (×13): 500 mg via ORAL
  Filled 2017-11-29 (×14): qty 1

## 2017-11-29 NOTE — ED Provider Notes (Signed)
Patient fell 6 days ago injuring his left hip. He was seen at orthopedic's office today diagnosed with subcapital hip fracture, left side. Here for admission for ORIF of left hip. He denies other complaint. He did suffer an abrasion to his left elbow as result fall however he denies elbow pain presently. Last tetanus immunization 2 years ago   Orlie Dakin, MD 11/29/17 [phone]

## 2017-11-29 NOTE — ED Notes (Signed)
Patient transported to x-ray. ?

## 2017-11-29 NOTE — ED Triage Notes (Signed)
Pt just found out he has a fractured hip and was sent here because no bed available.  Pt is to be seen by hospitalist.

## 2017-11-29 NOTE — Progress Notes (Signed)
Orthopedic Tech Progress Note Patient Details:  Bunnie Lederman Nakanishi October 07, 1929 [id number]  Patient ID: Ashby Dawes Cercone, male   DOB: October 03, 1929, 84 y.o.   MRN: [id number] Pt cant have ohf due to age restrictions.  Karolee Stamps 11/29/2017, 11:57 PM

## 2017-11-29 NOTE — H&P (View-Only) (Signed)
Reason for Consult:hip fracture Referring Physician: hospitalist  [name] is an 84 y.o. male.  HPI: [name] presented to the office today for evaluation of L leg pain. He fell 6 days ago on a concrete surface when he missed a step. The hip did not seem to be painful immediately, he was able to weightbear. The next day he woke up with pain which has progressively worsened. He reports weightbearing pain in the L hip and thigh as well as the lower leg. He reports weakness in the left hip. Prior to injury he was ambulating without assistance, he and his wife live in their own home. He is now using a rolling walker and reports increasing difficulty ambulating. He reports chronic swelling in the left leg but it is worse than normal since the injury. He is on coumadin for AFib and does have a pacer. He has been able to come off coumadin for procedures in the past. His cardiologist is Dr. Rayann Heman. Most recent INR was 2.5 at goal. He also has sleep apnea and uses a CPAP and reports some history of pneumonia although he has no symptoms currently.  Past Medical History:  Diagnosis Date  . Allergic rhinitis   . Arthritis   . BPH (benign prostatic hyperplasia)   . Bronchitis   . Complete heart block ([place])    pacemaker originally placed in 1988  . Dementia   . Esophageal reflux   . Hypertension   . Lung nodule    noted on CXR December 2012  . Normal nuclear stress test 2013  . OSA (obstructive sleep apnea)    uses cpap  . Permanent atrial fibrillation (HCC)    on coumadin  . Pneumonia   . Presence of permanent cardiac pacemaker   . Renal insufficiency    Rt kidney removed d/t maglinant tumor  . RLS (restless legs syndrome)   . Stroke [facility])    TIA greater than 20 years  . Transitional cell carcinoma ([place])    s/p nephrectomy in  2010    Past Surgical History:  Procedure Laterality Date  . CARDIOVASCULAR STRESS TEST  03/23/2009   EF 65%, NORMAL  . CATARACT EXTRACTION W/PHACO Left 08/26/2015    Procedure: CATARACT EXTRACTION PHACO AND INTRAOCULAR LENS PLACEMENT (IOC);  Surgeon: Marylynn Pearson, MD;  Location: [place];  Service: Ophthalmology;  Laterality: Left;  . DOPPLER ECHOCARDIOGRAPHY  03/26/2001   EF 55%  . EYE SURGERY     tumor removed from Rt eye lid  . HERNIA REPAIR     multiple  . MASS EXCISION Left 11/07/2012   Procedure: MINOR EXCISION OF MASS;  Surgeon: Haywood Lasso, MD;  Location: [place];  Service: General;  Laterality: Left;  . NEPHRECTOMY  2010   rt  . PACEMAKER INSERTION  1988   most recent generator change by Dr Rayann Heman 20067/2/13 with new right ventricular lead placed  . PERMANENT PACEMAKER GENERATOR CHANGE N/A 01/03/2012   Procedure: PERMANENT PACEMAKER GENERATOR CHANGE;  Surgeon: Thompson Grayer, MD;  Location: [facility] CATH LAB;  Service: Cardiovascular;  Laterality: N/A;  . US ECHOCARDIOGRAPHY  04/22/2008   EF 55-60%    Family History  Problem Relation Age of Onset  . Stroke Mother   . Stroke Father     Social History:  reports that he has never smoked. He has never used smokeless tobacco. He reports that he does not drink alcohol or use drugs.  Allergies: No Known Allergies  Medications:  acetaminophen 300  mg-codeine 30 mg tablet clonazePAM 2 mg tablet doxazosin 2 mg tablet finasteride 5 mg tablet furosemide 40 mg tablet ipratropium bromide 42 mcg (0.06 %) nasal spray metoprolol tartrate 25 mg tablet warfarin 5 mg tablet  No results found for this or any previous visit (from the past 48 hour(s)).  No results found.  Review of Systems  Constitutional: Negative.   HENT: Negative.   Eyes: Negative.   Respiratory: Negative.   Cardiovascular: Negative.   Gastrointestinal: Negative.   Genitourinary: Negative.   Musculoskeletal: Positive for joint pain.  Skin: Negative.   Neurological: Negative.    Blood pressure 135/69, pulse 69, temperature 98.3 F (36.8 C), temperature source Oral, resp. rate 18, SpO2 100 %. Physical Exam   Constitutional: He is oriented to person, place, and time. He appears well-developed and well-nourished.  HENT:  Head: Normocephalic.  Eyes: Pupils are equal, round, and reactive to light.  Neck: Normal range of motion.  Cardiovascular: Normal rate.  Respiratory: Effort normal.  GI: Soft.  Musculoskeletal:  Antalgic gait, use of a walker Tender L hip greater trochanter LLE edema Pain L groin with hip rotation  Neurological: He is alert and oriented to person, place, and time.  Skin: Skin is warm and dry.   Xrays done in office with L hip subcapital fx slightly impacted  Assessment/Plan: L hip fx  Plan- admit to hospitalist service due to medical hx Will require surgery- L total hip anterior approach, plan for Friday by Dr. Lyla Glassing as long as his INR responds appropriately and that he is medically cleared by hospitalist team and any necessary consults. Hold coumadin for surgery Vit K 10mg  PO tonight Check PT/INR tomorrow morning, if below 1.5 could consider surgery tomorrow instead of Friday Discussed post-op protocols, plan for home with HHPT post-op, WBAT, anticoagulation with coumadin Discussed with pt and his wife extensively. Discussed with Dr. Tonita Cong, Dr. Lyla Glassing, and Dr. Stann Mainland (on call today).  Cecilie Kicks. 11/29/2017, 7:20 PM   [phone]

## 2017-11-29 NOTE — ED Provider Notes (Signed)
[facility] EMERGENCY DEPARTMENT Provider Note   CSN: [id number] Arrival date & time: 11/29/17  1734     History   Chief Complaint Chief Complaint  Patient presents with  . Hip Injury    HPI [name] is a 84 y.o. male.  HPI   84 year old male presents today for hospital admission for hip fracture.  Patient notes he fell approximately 6 days ago on concrete surface after missing a step.  He notes left-sided hip pain and has been ambulating since.  He notes the pain has progressively worsened with radiation down into his thigh.  Patient does have a history A. fib and currently is on Coumadin.  X-ray in the office show left hip subcapital fracture slightly impacted.   Past Medical History:  Diagnosis Date  . Allergic rhinitis   . Arthritis   . BPH (benign prostatic hyperplasia)   . Bronchitis   . Complete heart block ([place])    pacemaker originally placed in 1988  . Dementia   . Esophageal reflux   . Hypertension   . Lung nodule    noted on CXR December 2012  . Normal nuclear stress test 2013  . OSA (obstructive sleep apnea)    uses cpap  . Permanent atrial fibrillation (HCC)    on coumadin  . Pneumonia   . Presence of permanent cardiac pacemaker   . Renal insufficiency    Rt kidney removed d/t maglinant tumor  . RLS (restless legs syndrome)   . Stroke [facility])    TIA greater than 20 years  . Transitional cell carcinoma ([place])    s/p nephrectomy in  2010    Patient Active Problem List   Diagnosis Date Noted  . Osteoarthritis of fifth carpometacarpal joint of left hand 07/18/2017  . Lung nodule 02/21/2016  . SIRS (systemic inflammatory response syndrome) ([place]) 01/02/2016  . Sepsis ([place]) 01/02/2016  . Encounter for therapeutic drug monitoring 09/23/2013  . Premature ventricular contraction 06/03/2013  . Pacemaker-Medtronic 01/04/2012  . Dyspnea 10/28/2011  . Complete heart block ([place]) 09/05/2011  . Long term ([place]) use of  anticoagulants 02/04/2011  . BRADYCARDIA-TACHYCARDIA SYNDROME 08/16/2010  . ALLERGIC RHINITIS 12/24/2007  . Obstructive sleep apnea 08/17/2007  . RESTLESS LEG SYNDROME 08/17/2007  . FIBRILLATION, ATRIAL 08/17/2007  . BRONCHITIS 08/17/2007  . Esophageal reflux 08/17/2007  . Insomnia secondary to chronic pain 08/17/2007  . CHEYNE-STOKES RESPIRATION 08/17/2007    Past Surgical History:  Procedure Laterality Date  . CARDIOVASCULAR STRESS TEST  03/23/2009   EF 65%, NORMAL  . CATARACT EXTRACTION W/PHACO Left 08/26/2015   Procedure: CATARACT EXTRACTION PHACO AND INTRAOCULAR LENS PLACEMENT (IOC);  Surgeon: Marylynn Pearson, MD;  Location: [place];  Service: Ophthalmology;  Laterality: Left;  . DOPPLER ECHOCARDIOGRAPHY  03/26/2001   EF 55%  . EYE SURGERY     tumor removed from Rt eye lid  . HERNIA REPAIR     multiple  . MASS EXCISION Left 11/07/2012   Procedure: MINOR EXCISION OF MASS;  Surgeon: Haywood Lasso, MD;  Location: [place];  Service: General;  Laterality: Left;  . NEPHRECTOMY  2010   rt  . PACEMAKER INSERTION  1988   most recent generator change by Dr Rayann Heman 20067/2/13 with new right ventricular lead placed  . PERMANENT PACEMAKER GENERATOR CHANGE N/A 01/03/2012   Procedure: PERMANENT PACEMAKER GENERATOR CHANGE;  Surgeon: Thompson Grayer, MD;  Location: [facility] C[facility] LAB;  Service: Cardiovascular;  Laterality: N/A;  . US ECHOCARDIOGRAPHY  04/22/2008  EF 55-60%        Home Medications    Prior to Admission medications   Medication Sig Start Date End Date Taking? Authorizing Provider  acetaminophen (TYLENOL) 325 MG tablet Take 650 mg by mouth daily as needed for mild pain or fever.    [provider]  acetaminophen-codeine (TYLENOL #3) 300-30 MG tablet Take 1 tablet by mouth every 4 (four) hours as needed. for pain 10/17/17   [provider]  aspirin 81 MG tablet Take 81 mg by mouth every morning.     [provider]  clonazePAM (KLONOPIN) 2 MG  tablet Take 1 tablet (2 mg total) by mouth at bedtime. 09/22/16   Baird Lyons D, MD  cycloSPORINE (RESTASIS) 0.05 % ophthalmic emulsion Place 1 drop into both eyes 2 (two) times daily.     [provider]  doxazosin (CARDURA) 2 MG tablet Take 2 mg by mouth daily.    [provider]  finasteride (PROSCAR) 5 MG tablet Take 5 mg by mouth daily.    [provider]  furosemide (LASIX) 40 MG tablet Take 1 tablet (40 mg total) by mouth daily. 02/20/17   Allred, Jeneen Rinks, MD  galantamine (RAZADYNE) 8 MG tablet Take 8 mg by mouth 2 (two) times daily.    [provider]  glucosamine-chondroitin 500-400 MG tablet Take 2 tablets by mouth every evening.    [provider]  hydroxypropyl methylcellulose (ISOPTO TEARS) 2.5 % ophthalmic solution Place 1 drop into both eyes 4 (four) times daily as needed for dry eyes.     [provider]  ipratropium (ATROVENT) 0.06 % nasal spray Place 2 sprays into both nostrils 2 (two) times daily.    [provider]  metoprolol tartrate (LOPRESSOR) 25 MG tablet Take 25 mg by mouth 2 (two) times daily.    [provider]  Multiple Vitamin (MULTIVITAMIN) capsule Take 1 capsule by mouth daily.      [provider]  OVER THE COUNTER MEDICATION Apply 1 application topically at bedtime. Eye gel    [provider]  warfarin (COUMADIN) 5 MG tablet Take as directed by coumadin clinic 09/21/17   Thompson Grayer, MD    Family History Family History  Problem Relation Age of Onset  . Stroke Mother   . Stroke Father     Social History Social History   Tobacco Use  . Smoking status: Never Smoker  . Smokeless tobacco: Never Used  Substance Use Topics  . Alcohol use: No  . Drug use: No     Allergies   Patient has no known allergies.   Review of Systems Review of Systems  All other systems reviewed and are negative.    Physical Exam Updated Vital Signs BP (!) 170/86   Pulse 71   Temp  98.3 F (36.8 C) (Oral)   Resp 16   SpO2 100%   Physical Exam  Constitutional: He is oriented to person, place, and time. He appears well-developed and well-nourished.  HENT:  Head: Normocephalic and atraumatic.  Eyes: Pupils are equal, round, and reactive to light. Conjunctivae are normal. Right eye exhibits no discharge. Left eye exhibits no discharge. No scleral icterus.  Neck: Normal range of motion. No JVD present. No tracheal deviation present.  Pulmonary/Chest: Effort normal. No stridor.  Musculoskeletal:  Left hip tenderness palpation of left lateral aspect  Minor bruising to the left forearm  Neurological: He is alert and oriented to person, place, and time. Coordination normal.  Psychiatric:  He has a normal mood and affect. His behavior is normal. Judgment and thought content normal.  Nursing note and vitals reviewed.    ED Treatments / Results  Labs (all labs ordered are listed, but only abnormal results are displayed) Labs Reviewed  COMPREHENSIVE METABOLIC PANEL - Abnormal; Notable for the following components:      Result Value   Glucose, Bld 101 (*)    BUN 32 (*)    Creatinine, Ser 1.58 (*)    ALT 16 (*)    GFR calc non Af Amer 38 (*)    GFR calc Af Amer 44 (*)    All other components within normal limits  CBC WITH DIFFERENTIAL/PLATELET - Abnormal; Notable for the following components:   RBC 3.65 (*)    Hemoglobin 11.5 (*)    HCT 37.2 (*)    MCV 101.9 (*)    All other components within normal limits  PROTIME-INR - Abnormal; Notable for the following components:   Prothrombin Time 29.7 (*)    All other components within normal limits  PROTIME-INR    EKG None  Radiology Dg Chest 2 View  Result Date: 11/29/2017 CLINICAL DATA:  Fall 6 days ago injuring left hip. EXAM: CHEST - 2 VIEW COMPARISON:  May 17, 2017 FINDINGS: Stable cardiomegaly. The hila, mediastinum, right-sided pacemaker, and pleura are normal. IMPRESSION: No active cardiopulmonary  disease. Electronically Signed   By: Dorise Bullion III M.D   On: 11/29/2017 20:54    Procedures Procedures (including critical care time)  Medications Ordered in ED Medications  phytonadione (VITAMIN K) tablet 10 mg (has no administration in time range)  ceFAZolin (ANCEF) IVPB 2g/100 mL premix (has no administration in time range)  morphine 4 MG/ML injection 4 mg (4 mg Intravenous Given 11/29/17 2012)     Initial Impression / Assessment and Plan / ED Course  I have reviewed the triage vital signs and the nursing notes.  Pertinent labs & imaging results that were available during my care of the patient were reviewed by me and considered in my medical decision making (see chart for details).     Labs: CBC, CMP, PT/INR  Imaging: DG chest 2 view   Consults:   Therapeutics: morphine   Discharge Meds:   Assessment/Plan: 84 year old male presents today for hospital admission for anticipated or management of hip fracture.  Dr. Lyla Glassing will perform surgery on Friday as long as INR responds appropriately.  Hospitalist consulted for admission.    Final Clinical Impressions(s) / ED Diagnoses   Final diagnoses:  Closed fracture of left hip, initial encounter [facility])    ED Discharge Orders    None       Francee Gentile 11/29/17 2152    Orlie Dakin, MD 11/29/17 2359

## 2017-11-29 NOTE — ED Triage Notes (Signed)
[place] been walking around in pain.  Pt saw Dr. Maxie Better today.

## 2017-11-29 NOTE — H&P (Signed)
History and Physical    [name] BTD:[id number] DOB: [birth year] DOA: 11/29/2017  Referring MD/NP/PA:   PCP: [name], MD   Patient coming from:  The patient is coming from home.  At baseline, pt is independent for most of ADL.   Chief Complaint: fall and left hip pain  HPI: [name] is a 84 y.o. male with medical history significant of hypertension, stroke, GERD, anxiety, BPH, third-degree AV block, pacemaker placement, OSA on CPAP, atrial fibrillation on Coumadin, transitional renal cell carcinoma (nephrectomy), dementia, CKD 3, who presents with fall and left hip pain.  Per his daughter, pt fell 5 days ago on concrete surface after missing a step. He injured left hip, but no head injury or LOC. He developed left hip pain. The pain is constant, mild to moderate, radiating to his left thigh. Pt was seen by ortho today. X-ray in the office show left hip subcapital fracture slightly impacted.  Patient does not have leg weakness or numbness.  He denies chest pain, shortness breath, cough.  No nausea, vomiting or abdominal pain.  Patient has mild intermittent diarrhea in the past 10 days.  He had one loose stool today.  No recent antibiotics use. No fever, chills.  Patient has chronic bilateral leg edema which is asymmetrical, left leg is worse than the right.   ED Course: pt was found to have WBC 4.8, INR 2.85, stable renal function, temperature normal, no tachycardia, no tachypnea, oxygen saturation 100% on room air, chest x-ray negative. Pt is admitted to telemetry bed as inpatient.  Orthopedic surgeon, Dr. Lyla Glassing was consulted.  Review of Systems:   General: no fevers, chills, no body weight gain, has fatigue HEENT: no blurry vision, hearing changes or sore throat Respiratory: no dyspnea, coughing, wheezing CV: no chest pain, no palpitations GI: no nausea, vomiting, abdominal pain, has diarrhea, no constipation GU: no dysuria, burning on urination, increased urinary  frequency, hematuria  Ext: has leg edema Neuro: no unilateral weakness, numbness, or tingling, no vision change or hearing loss. Had fall. Skin: no rash, no skin tear. MSK: has left hip pain. Heme: No easy bruising.  Travel history: No recent long distant travel.  Allergy: No Known Allergies  Past Medical History:  Diagnosis Date  . Allergic rhinitis   . Arthritis   . BPH (benign prostatic hyperplasia)   . Bronchitis   . Complete heart block ([place])    pacemaker originally placed in 1988  . Dementia   . Esophageal reflux   . Hypertension   . Lung nodule    noted on CXR December 2012  . Normal nuclear stress test 2013  . OSA (obstructive sleep apnea)    uses cpap  . Permanent atrial fibrillation (HCC)    on coumadin  . Pneumonia   . Presence of permanent cardiac pacemaker   . Renal insufficiency    Rt kidney removed d/t maglinant tumor  . RLS (restless legs syndrome)   . Stroke [facility])    TIA greater than 20 years  . Transitional cell carcinoma ([place])    s/p nephrectomy in  2010    Past Surgical History:  Procedure Laterality Date  . CARDIOVASCULAR STRESS TEST  03/23/2009   EF 65%, NORMAL  . CATARACT EXTRACTION W/PHACO Left 08/26/2015   Procedure: CATARACT EXTRACTION PHACO AND INTRAOCULAR LENS PLACEMENT (IOC);  Surgeon: Marylynn Pearson, MD;  Location: [place];  Service: Ophthalmology;  Laterality: Left;  . DOPPLER ECHOCARDIOGRAPHY  03/26/2001   EF 55%  .  EYE SURGERY     tumor removed from Rt eye lid  . HERNIA REPAIR     multiple  . MASS EXCISION Left 11/07/2012   Procedure: MINOR EXCISION OF MASS;  Surgeon: Haywood Lasso, MD;  Location: [place];  Service: General;  Laterality: Left;  . NEPHRECTOMY  2010   rt  . PACEMAKER INSERTION  1988   most recent generator change by Dr Rayann Heman 20067/2/13 with new right ventricular lead placed  . PERMANENT PACEMAKER GENERATOR CHANGE N/A 01/03/2012   Procedure: PERMANENT PACEMAKER GENERATOR CHANGE;  Surgeon: Thompson Grayer, MD;  Location: [facility] CATH LAB;  Service: Cardiovascular;  Laterality: N/A;  . US ECHOCARDIOGRAPHY  04/22/2008   EF 55-60%    Social History:  reports that he has never smoked. He has never used smokeless tobacco. He reports that he does not drink alcohol or use drugs.  Family History:  Family History  Problem Relation Age of Onset  . Stroke Mother   . Stroke Father      Prior to Admission medications   Medication Sig Start Date End Date Taking? Authorizing Provider  acetaminophen (TYLENOL) 325 MG tablet Take 650 mg by mouth daily as needed for mild pain or fever.    [provider]  acetaminophen-codeine (TYLENOL #3) 300-30 MG tablet Take 1 tablet by mouth every 4 (four) hours as needed. for pain 10/17/17   [provider]  aspirin 81 MG tablet Take 81 mg by mouth every morning.     [provider]  clonazePAM (KLONOPIN) 2 MG tablet Take 1 tablet (2 mg total) by mouth at bedtime. 09/22/16   Baird Lyons D, MD  cycloSPORINE (RESTASIS) 0.05 % ophthalmic emulsion Place 1 drop into both eyes 2 (two) times daily.     [provider]  doxazosin (CARDURA) 2 MG tablet Take 2 mg by mouth daily.    [provider]  finasteride (PROSCAR) 5 MG tablet Take 5 mg by mouth daily.    [provider]  furosemide (LASIX) 40 MG tablet Take 1 tablet (40 mg total) by mouth daily. 02/20/17   Allred, Jeneen Rinks, MD  galantamine (RAZADYNE) 8 MG tablet Take 8 mg by mouth 2 (two) times daily.    [provider]  glucosamine-chondroitin 500-400 MG tablet Take 2 tablets by mouth every evening.    [provider]  hydroxypropyl methylcellulose (ISOPTO TEARS) 2.5 % ophthalmic solution Place 1 drop into both eyes 4 (four) times daily as needed for dry eyes.     [provider]  ipratropium (ATROVENT) 0.06 % nasal spray Place 2 sprays into both nostrils 2 (two) times daily.    [provider]  metoprolol tartrate (LOPRESSOR) 25 MG  tablet Take 25 mg by mouth 2 (two) times daily.    [provider]  Multiple Vitamin (MULTIVITAMIN) capsule Take 1 capsule by mouth daily.      [provider]  OVER THE COUNTER MEDICATION Apply 1 application topically at bedtime. Eye gel    [provider]  warfarin (COUMADIN) 5 MG tablet Take as directed by coumadin clinic 09/21/17   Thompson Grayer, MD    Physical Exam: Vitals:   11/29/17 1836 11/29/17 2032 11/29/17 2100  BP: 135/69 (!) 163/93 (!) 170/86  Pulse: 69 72 71  Resp: 18 16   Temp: 98.3 F (36.8 C)    TempSrc: Oral    SpO2: 100% 100% 100%   General: Not in acute distress HEENT:  Eyes: PERRL, EOMI, no scleral icterus.       ENT: No discharge from the ears and nose, no pharynx injection, no tonsillar enlargement.        Neck: No JVD, no bruit, no mass felt. Heme: No neck lymph node enlargement. Cardiac: S1/S2, RRR, No murmurs, No gallops or rubs. Respiratory:  No rales, wheezing, rhonchi or rubs. GI: Soft, nondistended, nontender, no rebound pain, no organomegaly, BS present. GU: No hematuria Ext: has leg edema bilaterally, 3+ in left leg and 1+ in right leg. 2+DP/PT pulse bilaterally. Musculoskeletal: has left hip tenderness. Skin: No rashes.  Neuro: Alert, oriented X3, cranial nerves II-XII grossly intact, moves all extremities. Psych: Patient is not psychotic, no suicidal or hemocidal ideation.  Labs on Admission: I have personally reviewed following labs and imaging studies  CBC: Recent Labs  Lab 11/29/17 2003  WBC 4.8  NEUTROABS 2.7  HGB 11.5*  HCT 37.2*  MCV 101.9*  PLT 169   Basic Metabolic Panel: Recent Labs  Lab 11/29/17 2003  NA 144  K 4.3  CL 109  CO2 27  GLUCOSE 101*  BUN 32*  CREATININE 1.58*  CALCIUM 9.4   GFR: CrCl cannot be calculated (Unknown ideal weight.). Liver Function Tests: Recent Labs  Lab 11/29/17 2003  AST 21  ALT 16*  ALKPHOS 102  BILITOT 0.9  PROT 6.8  ALBUMIN 3.6   No results  for input(s): LIPASE, AMYLASE in the last 168 hours. No results for input(s): AMMONIA in the last 168 hours. Coagulation Profile: Recent Labs  Lab 11/29/17 2003  INR 2.85   Cardiac Enzymes: No results for input(s): CKTOTAL, CKMB, CKMBINDEX, TROPONINI in the last 168 hours. BNP (last 3 results) No results for input(s): PROBNP in the last 8760 hours. HbA1C: No results for input(s): HGBA1C in the last 72 hours. CBG: No results for input(s): GLUCAP in the last 168 hours. Lipid Profile: No results for input(s): CHOL, HDL, LDLCALC, TRIG, CHOLHDL, LDLDIRECT in the last 72 hours. Thyroid Function Tests: No results for input(s): TSH, T4TOTAL, FREET4, T3FREE, THYROIDAB in the last 72 hours. Anemia Panel: No results for input(s): VITAMINB12, FOLATE, FERRITIN, TIBC, IRON, RETICCTPCT in the last 72 hours. Urine analysis:    Component Value Date/Time   COLORURINE AMBER (A) 01/02/2016 1128   APPEARANCEUR CLOUDY (A) 01/02/2016 1128   LABSPEC 1.026 01/02/2016 1128   PHURINE 5.5 01/02/2016 1128   GLUCOSEU NEGATIVE 01/02/2016 1128   HGBUR MODERATE (A) 01/02/2016 1128   BILIRUBINUR SMALL (A) 01/02/2016 1128   KETONESUR 15 (A) 01/02/2016 1128   PROTEINUR 100 (A) 01/02/2016 1128   NITRITE NEGATIVE 01/02/2016 1128   LEUKOCYTESUR MODERATE (A) 01/02/2016 1128   Sepsis Labs: @LABRCNTIP (procalcitonin:4,lacticidven:4) )No results found for this or any previous visit (from the past 240 hour(s)).   Radiological Exams on Admission: Dg Chest 2 View  Result Date: 11/29/2017 CLINICAL DATA:  Fall 6 days ago injuring left hip. EXAM: CHEST - 2 VIEW COMPARISON:  May 17, 2017 FINDINGS: Stable cardiomegaly. The hila, mediastinum, right-sided pacemaker, and pleura are normal. IMPRESSION: No active cardiopulmonary disease. Electronically Signed   By: Dorise Bullion III M.D   On: 11/29/2017 20:54     EKG:  Not done in ED, will get one.   Assessment/Plan Principal Problem:   Closed left hip fracture  (HCC) Active Problems:   Obstructive sleep apnea   FIBRILLATION, ATRIAL   Long term ([place]) use of anticoagulants   Pacemaker-Medtronic   Lower leg edema   Fall  Essential hypertension   Stroke (cerebrum) (HCC)   BPH (benign prostatic hyperplasia)   Dementia   CKD (chronic kidney disease), stage III (HCC)   Closed right hip fracture, initial encounter ([place])   Diarrhea   Fall and closed left hip fracture [facility]): Patient is seem to have had a mechanical fall.  Family strongly denied head injury and fefused CT head. Patient has mild to moderate pain now. No neurovascular compromise. Orthopedic surgeon, Dr. Lyla Glassing was consulted. Need to reverse INR before surgery can be performed.  - will admit to Med-surg bed - Pain control: morphine prn and percocet - When necessary Zofran for nausea - Robaxin for muscle spasm - type and cross - INR/PTT - PT/OT when able to (not ordered now) - Vk 10 mg was ordered in ED-->will repeat INR in AM  Atrial Fibrillation: CHA2DS2-VASc Score is 5, needs oral anticoagulation. Patient is on Coumadin at home. INR is 2.85 on admission. Heart rate is well controlled. -hold coumadin -continue metoprolol  Lower leg edema: Patient has asymmetric leg edema.  Etiology is not clear.  He does not carry CHF diagnosis, but he is on Lasix.  2D echo on 01/04/2016 showed EF 50-55%. -will get LE Doppler to rule out DVT -get 2d echo for evaluation if patient has CHF, this will be helpful for preparation for surgery -check BNP -Continue home Lasix  HTN:  -Continue home medications: Metoprolol -Patient is also on Lasix -IV hydralazine prn  OSA: -CPAP  Stroke (cerebrum) ([place]): -Continue aspirin  BPH: stable - Continue Cardura and Proscar  Dementia: No behavior disturbance. -Continue galantamine  CKD (chronic kidney disease), stage III ([place]): Stable.  Recent creatinine 1.8 at baseline.  His creatinine is 1.58, BUN 32. -Follow-up renal function by  BMP  Diarrhea: Mild intermittent diarrhea.  He just had one loose stool today, no suspicion for C. difficile colitis -PRN Imodium  Anxiety: -PRN Klonopin   DVT ppx: SCD to right leg Code Status: Full code Family Communication:   Yes, patient's  Wife and daguther  at bed side Disposition Plan:  Anticipate discharge back to previous home environment Consults called: ortho, Dr. Lyla Glassing Admission status:  Inpatient/tele      Date of Service 11/29/2017    [place] Pager [phone]  If 7PM-7AM, please contact night-coverage [email or web address] Password [facility] 11/29/2017, 10:39 PM

## 2017-11-29 NOTE — Consult Note (Signed)
Reason for Consult:hip fracture Referring Physician: hospitalist  [name] is an 84 y.o. male.  HPI: [name] presented to the office today for evaluation of L leg pain. He fell 6 days ago on a concrete surface when he missed a step. The hip did not seem to be painful immediately, he was able to weightbear. The next day he woke up with pain which has progressively worsened. He reports weightbearing pain in the L hip and thigh as well as the lower leg. He reports weakness in the left hip. Prior to injury he was ambulating without assistance, he and his wife live in their own home. He is now using a rolling walker and reports increasing difficulty ambulating. He reports chronic swelling in the left leg but it is worse than normal since the injury. He is on coumadin for AFib and does have a pacer. He has been able to come off coumadin for procedures in the past. His cardiologist is Dr. Rayann Heman. Most recent INR was 2.5 at goal. He also has sleep apnea and uses a CPAP and reports some history of pneumonia although he has no symptoms currently.  Past Medical History:  Diagnosis Date  . Allergic rhinitis   . Arthritis   . BPH (benign prostatic hyperplasia)   . Bronchitis   . Complete heart block ([place])    pacemaker originally placed in 1988  . Dementia   . Esophageal reflux   . Hypertension   . Lung nodule    noted on CXR December 2012  . Normal nuclear stress test 2013  . OSA (obstructive sleep apnea)    uses cpap  . Permanent atrial fibrillation (HCC)    on coumadin  . Pneumonia   . Presence of permanent cardiac pacemaker   . Renal insufficiency    Rt kidney removed d/t maglinant tumor  . RLS (restless legs syndrome)   . Stroke [facility])    TIA greater than 20 years  . Transitional cell carcinoma ([place])    s/p nephrectomy in  2010    Past Surgical History:  Procedure Laterality Date  . CARDIOVASCULAR STRESS TEST  03/23/2009   EF 65%, NORMAL  . CATARACT EXTRACTION W/PHACO Left 08/26/2015    Procedure: CATARACT EXTRACTION PHACO AND INTRAOCULAR LENS PLACEMENT (IOC);  Surgeon: Marylynn Pearson, MD;  Location: [place];  Service: Ophthalmology;  Laterality: Left;  . DOPPLER ECHOCARDIOGRAPHY  03/26/2001   EF 55%  . EYE SURGERY     tumor removed from Rt eye lid  . HERNIA REPAIR     multiple  . MASS EXCISION Left 11/07/2012   Procedure: MINOR EXCISION OF MASS;  Surgeon: Haywood Lasso, MD;  Location: [place];  Service: General;  Laterality: Left;  . NEPHRECTOMY  2010   rt  . PACEMAKER INSERTION  1988   most recent generator change by Dr Rayann Heman 20067/2/13 with new right ventricular lead placed  . PERMANENT PACEMAKER GENERATOR CHANGE N/A 01/03/2012   Procedure: PERMANENT PACEMAKER GENERATOR CHANGE;  Surgeon: Thompson Grayer, MD;  Location: [facility] CATH LAB;  Service: Cardiovascular;  Laterality: N/A;  . US ECHOCARDIOGRAPHY  04/22/2008   EF 55-60%    Family History  Problem Relation Age of Onset  . Stroke Mother   . Stroke Father     Social History:  reports that he has never smoked. He has never used smokeless tobacco. He reports that he does not drink alcohol or use drugs.  Allergies: No Known Allergies  Medications:  acetaminophen 300  mg-codeine 30 mg tablet clonazePAM 2 mg tablet doxazosin 2 mg tablet finasteride 5 mg tablet furosemide 40 mg tablet ipratropium bromide 42 mcg (0.06 %) nasal spray metoprolol tartrate 25 mg tablet warfarin 5 mg tablet  No results found for this or any previous visit (from the past 48 hour(s)).  No results found.  Review of Systems  Constitutional: Negative.   HENT: Negative.   Eyes: Negative.   Respiratory: Negative.   Cardiovascular: Negative.   Gastrointestinal: Negative.   Genitourinary: Negative.   Musculoskeletal: Positive for joint pain.  Skin: Negative.   Neurological: Negative.    Blood pressure 135/69, pulse 69, temperature 98.3 F (36.8 C), temperature source Oral, resp. rate 18, SpO2 100 %. Physical Exam   Constitutional: He is oriented to person, place, and time. He appears well-developed and well-nourished.  HENT:  Head: Normocephalic.  Eyes: Pupils are equal, round, and reactive to light.  Neck: Normal range of motion.  Cardiovascular: Normal rate.  Respiratory: Effort normal.  GI: Soft.  Musculoskeletal:  Antalgic gait, use of a walker Tender L hip greater trochanter LLE edema Pain L groin with hip rotation  Neurological: He is alert and oriented to person, place, and time.  Skin: Skin is warm and dry.   Xrays done in office with L hip subcapital fx slightly impacted  Assessment/Plan: L hip fx  Plan- admit to hospitalist service due to medical hx Will require surgery- L total hip anterior approach, plan for Friday by Dr. Lyla Glassing as long as his INR responds appropriately and that he is medically cleared by hospitalist team and any necessary consults. Hold coumadin for surgery Vit K 10mg  PO tonight Check PT/INR tomorrow morning, if below 1.5 could consider surgery tomorrow instead of Friday Discussed post-op protocols, plan for home with HHPT post-op, WBAT, anticoagulation with coumadin Discussed with pt and his wife extensively. Discussed with Dr. Tonita Cong, Dr. Lyla Glassing, and Dr. Stann Mainland (on call today).  Cecilie Kicks. 11/29/2017, 7:20 PM   [phone] cell

## 2017-11-30 ENCOUNTER — Inpatient Hospital Stay (HOSPITAL_COMMUNITY): Payer: PPO

## 2017-11-30 DIAGNOSIS — W19XXXD Unspecified fall, subsequent encounter: Secondary | ICD-10-CM

## 2017-11-30 DIAGNOSIS — I351 Nonrheumatic aortic (valve) insufficiency: Secondary | ICD-10-CM

## 2017-11-30 DIAGNOSIS — I340 Nonrheumatic mitral (valve) insufficiency: Secondary | ICD-10-CM

## 2017-11-30 DIAGNOSIS — R609 Edema, unspecified: Secondary | ICD-10-CM

## 2017-11-30 LAB — BASIC METABOLIC PANEL
Anion gap: 7 (ref 5–15)
BUN: 29 mg/dL — AB (ref 6–20)
CO2: 27 mmol/L (ref 22–32)
Calcium: 9.1 mg/dL (ref 8.9–10.3)
Chloride: 109 mmol/L (ref 101–111)
Creatinine, Ser: 1.45 mg/dL — ABNORMAL HIGH (ref 0.61–1.24)
GFR, EST AFRICAN AMERICAN: 49 mL/min — AB (ref >60)
GFR, EST NON AFRICAN AMERICAN: 42 mL/min — AB (ref >60)
Glucose, Bld: 129 mg/dL — ABNORMAL HIGH (ref 65–99)
Potassium: 4.4 mmol/L (ref 3.5–5.1)
SODIUM: 143 mmol/L (ref 135–145)

## 2017-11-30 LAB — PROTIME-INR
INR: 3.36
INR: 2.83
PROTHROMBIN TIME: 29.6 s — AB (ref 11.4–15.2)
Prothrombin Time: 33.8 seconds — ABNORMAL HIGH (ref 11.4–15.2)

## 2017-11-30 LAB — BRAIN NATRIURETIC PEPTIDE: B NATRIURETIC PEPTIDE 5: 219.4 pg/mL — AB (ref 0.0–100.0)

## 2017-11-30 LAB — CBC
HCT: 34.1 % — ABNORMAL LOW (ref 39.0–52.0)
HEMOGLOBIN: 10.6 g/dL — AB (ref 13.0–17.0)
MCH: 31.4 pg (ref 26.0–34.0)
MCHC: 31.1 g/dL (ref 30.0–36.0)
MCV: 100.9 fL — ABNORMAL HIGH (ref 78.0–100.0)
PLATELETS: 174 10*3/uL (ref 150–400)
RBC: 3.38 MIL/uL — ABNORMAL LOW (ref 4.22–5.81)
RDW: 13.4 % (ref 11.5–15.5)
WBC: 4.7 10*3/uL (ref 4.0–10.5)

## 2017-11-30 LAB — SURGICAL PCR SCREEN
MRSA, PCR: NEGATIVE
Staphylococcus aureus: NEGATIVE

## 2017-11-30 LAB — ECHOCARDIOGRAM COMPLETE

## 2017-11-30 LAB — ABO/RH: ABO/RH(D): O POS

## 2017-11-30 LAB — APTT: aPTT: 40 seconds — ABNORMAL HIGH (ref 24–36)

## 2017-11-30 LAB — TYPE AND SCREEN
ABO/RH(D): O POS
ANTIBODY SCREEN: NEGATIVE

## 2017-11-30 MED ORDER — ACETAMINOPHEN 325 MG PO TABS
650.0000 mg | ORAL_TABLET | Freq: Four times a day (QID) | ORAL | Status: DC | PRN
  Administered 2017-12-01 – 2017-12-08 (×10): 650 mg via ORAL
  Filled 2017-11-30 (×11): qty 2

## 2017-11-30 MED ORDER — PHYTONADIONE 5 MG PO TABS
2.5000 mg | ORAL_TABLET | Freq: Once | ORAL | Status: AC
  Administered 2017-11-30: 2.5 mg via ORAL
  Filled 2017-11-30: qty 1

## 2017-11-30 NOTE — ED Notes (Signed)
Secretary notified RT for pt.'s CPAP .

## 2017-11-30 NOTE — Progress Notes (Signed)
RT placed patient on CPAP on AUTO with a maximum 20 cmH2O and minimum 5cmH2O via his home nasal pillows and hospital provided machine. Patient is tolerating at this time.

## 2017-11-30 NOTE — Progress Notes (Signed)
*  Preliminary Results* Bilateral lower extremity venous duplex completed. Bilateral lower extremities are negative for deep vein thrombosis. Low level echoes are seen at the subvalvular level of the left popliteal vein, suggestive of hyperacute platelet aggregation. Future development of DVT cannot be excluded. Bilateral pulsatile venous flow is suggestive of possible elevated right sided heart pressure. There is no evidence of right Baker's cyst, there is evidence of left Baker's cyst.   11/30/2017 2:03 PM Maudry Mayhew, BS, RVT, RDCS, RDMS

## 2017-11-30 NOTE — Progress Notes (Signed)
Patient transported to 5N.  RT placed patient back on CPAP.  Patient tolerating well at this time.

## 2017-11-30 NOTE — Progress Notes (Signed)
MD notified of PT/INR results via AMION, awaiting return call.  [id number]

## 2017-11-30 NOTE — Progress Notes (Signed)
Subjective: L hip subcapital fx. Awaiting surgery planning for Friday.  INR initially 2.85, this morning up to 3.3 despite 10mg  vit K PO and holding coumadin.  Reports he is comfortable this AM. Has been unable to have a BM on the bedpan and would like to be able to sit on the commode for that.  His wife is in the room with him this morning.  Objective: Vital signs in last 24 hours: Temp:  [97.7 F (36.5 C)-98.3 F (36.8 C)] 98 F (36.7 C) (05/30 0354) Pulse Rate:  [69-72] 70 (05/30 0854) Resp:  [16-18] 17 (05/30 0237) BP: (124-170)/(63-93) 130/70 (05/30 0854) SpO2:  [93 %-100 %] 94 % (05/30 0354)  Intake/Output from previous day: 05/29 0701 - 05/30 0700 In: -  Out: 250 [Urine:250] Intake/Output this shift: No intake/output data recorded.  Recent Labs    11/29/17 2003 11/30/17 0334  HGB 11.5* 10.6*   Recent Labs    11/29/17 2003 11/30/17 0334  WBC 4.8 4.7  RBC 3.65* 3.38*  HCT 37.2* 34.1*  PLT 177 174   Recent Labs    11/29/17 2003 11/30/17 0334  NA 144 143  K 4.3 4.4  CL 109 109  CO2 27 27  BUN 32* 29*  CREATININE 1.58* 1.45*  GLUCOSE 101* 129*  CALCIUM 9.4 9.1   Recent Labs    11/29/17 2003 11/30/17 0334  INR 2.85 3.36    Neurologically intact ABD soft Neurovascular intact Sensation intact distally Intact pulses distally Dorsiflexion/Plantar flexion intact No cellulitis present Compartment soft no sign of DVT   Assessment/Plan: L hip fx  Plan for surgery when INR is down. Unfortunately it is up this morning from initial draw last night. Will allow to eat today, resume cardiac diet NPO p MN for tomorrow for OR Appreciate hospitalist assistance with getting INR down to appropriate level for surgery. Dr. Lyla Glassing prefers to avoid FFP. Continue to hold coumadin Can get up to commode with assistance only otherwise continue bedrest Discussed with Elmo Putt, pt's nurse for today   Cecilie Kicks. 11/30/2017, 9:39 AM

## 2017-11-30 NOTE — Progress Notes (Signed)
PROGRESS NOTE   [name]  PRF:[id number]    DOB: 1930-01-15    DOA: 11/29/2017  PCP: [name], MD   I have briefly reviewed patients previous medical [facility] in [facility].  Brief Narrative:  84 year old married male, physically active and independent, PMH of complete heart block status post permanent pacemaker, last generator changed 2013 (Dr. Rayann Heman, EP cardiology), dementia, GERD, HTN, OSA on CPAP, permanent A. fib on Coumadin, transitional cell carcinoma status post right nephrectomy, stage III chronic kidney disease, stroke/TIA, frequent falls (3 this year) missed a step and fell on a concrete surface 5 days PTA leading to left hip pain and fracture, seen at orthopedic office and sent to the hospital for admission.  Orthopedics consulted and plan surgical repair pending reversal of Coumadin anticoagulation   Assessment & Plan:   Principal Problem:   Closed left hip fracture ([place]) Active Problems:   Obstructive sleep apnea   FIBRILLATION, ATRIAL   Long term ([place]) use of anticoagulants   Pacemaker-[employer]   Lower leg edema   Fall   Essential hypertension   Stroke ([place]) (HCC)   BPH (benign prostatic hyperplasia)   Dementia   CKD (chronic kidney disease), stage III (HCC)   Closed right hip fracture, initial encounter ([place])   Diarrhea   Left hip fracture: Sustained after mechanical fall at home.  Orthopedics consultation appreciated and plan for surgery when INR is down (3.36 this morning).  Possibly to OR 5/31.  Based on available data, patient is at moderate risk for perioperative CV events and may proceed with surgery without any further cardiac work-up with close perioperative monitoring.  Pain control.  Mechanical fall: As per spouse, patient has sustained 3 falls this year.  During the first fall, he had sustained extensive trauma/hematoma to right upper extremity but did not require surgery.  PT and OT evaluation.  Permanent atrial fibrillation:  CHA2DS2-VASc Score is 5.  Patient on Coumadin at home.  Presented with INR of 2.85.  Received a dose of vitamin K 10 mg orally last night despite which INR is increased to 3.36 after about 6 hours,?  Inadequate time.  Repeat INR now and may need to consider repeating vitamin K dose to bring down INR appropriately for surgery.  Orthopedics wishes to avoid FFP.  Ventricular paced on monitor.  Continue metoprolol.  Status post PPM for complete heart block: As per spouse, initial pacemaker was placed approximately 31 years ago and has had a changed about 3 or 4 times.  Stable.  Dementia: Mild.  Mental status at baseline.  Essential hypertension: Controlled.  OSA on CPAP: Stable.  Status post right nephrectomy for TCC/stage III chronic kidney disease: Baseline creatinine prior to admission was 1.8 but 1.45 here.  Follows with urology annually/Dr. Alinda Money and gets cystoscopy.  Stable.  History of TIA/stroke: Coumadin on hold for surgery and to be resumed post surgery.  Continue aspirin.  Macrocytic anemia: Unclear etiology.  Follow CBC postop.  Lower extremity edema: Unclear etiology.  More on left than right which may be related to his [place] left hip fracture.  Does not carry CHF diagnosis.  Remains on Lasix, continue.  TTE 01/04/2016 showed LVEF 50-55%.  Therapeutically anticoagulated PTA and thereby low index of suspicion for DVT.  Diarrhea: Apparently had some loose stools at home but no BM since admission.  Monitor.  Low index of suspicion for C. difficile.   DVT prophylaxis: SCDs.  INR still therapeutic. Code Status: Full Family Communication: Discussed in  detail with patient's spouse at bedside. Disposition: Determine, will likely need SNF when medically ready.   Consultants:  Orthopedics  Procedures:  None  Antimicrobials:  None   Subjective: Interviewed and examined patient with spouse at bedside.  Reports that he missed a step while climbing up a hill, fell on a concrete  surface leading to left hip pain.  No head injury or LOC.  This was his third fall this year.?  Unsteady gait at times.  Physically active and goes to the [facility] 3 times a week and does Zumba, walks his dog daily, no report of chest pain, dyspnea even with exertion.  No bleeding reported.  ROS: As above.  Objective:  Vitals:   11/30/17 0118 11/30/17 0237 11/30/17 0354 11/30/17 0854  BP: 132/63 129/77 124/69 130/70  Pulse: 70 69 70 70  Resp: 16 17    Temp:  97.7 F (36.5 C) 98 F (36.7 C)   TempSrc:  Oral Oral   SpO2: 96% 93% 94%     Examination:  General exam: Pleasant elderly male, moderately built and nourished lying comfortably propped up in bed. Respiratory system: Clear to auscultation. Respiratory effort normal. Cardiovascular system: S1 & S2 heard, RRR. No JVD, murmurs, rubs, gallops or clicks.  1+ pitting bilateral leg edema left > right.  Telemetry personally reviewed and shows ventricular paced rhythm. Gastrointestinal system: Abdomen is nondistended, soft and nontender. No organomegaly or masses felt. Normal bowel sounds heard. Central nervous system: Alert and oriented. No focal neurological deficits. Extremities: Symmetric 5 x 5 power except left lower extremity where movements are restricted by left hip pain from fracture.. Skin: No rashes, lesions or ulcers Psychiatry: Judgement and insight appear normal. Mood & affect appropriate.     Data Reviewed: I have personally reviewed following labs and imaging studies  CBC: Recent Labs  Lab 11/29/17 2003 11/30/17 0334  WBC 4.8 4.7  NEUTROABS 2.7  --   HGB 11.5* 10.6*  HCT 37.2* 34.1*  MCV 101.9* 100.9*  PLT 177 630   Basic Metabolic Panel: Recent Labs  Lab 11/29/17 2003 11/30/17 0334  NA 144 143  K 4.3 4.4  CL 109 109  CO2 27 27  GLUCOSE 101* 129*  BUN 32* 29*  CREATININE 1.58* 1.45*  CALCIUM 9.4 9.1   Liver Function Tests: Recent Labs  Lab 11/29/17 2003  AST 21  ALT 16*  ALKPHOS 102  BILITOT  0.9  PROT 6.8  ALBUMIN 3.6   Coagulation Profile: Recent Labs  Lab 11/29/17 2003 11/30/17 0334  INR 2.85 3.36   CBG: No results for input(s): GLUCAP in the last 168 hours.  Recent Results (from the past 240 hour(s))  Surgical pcr screen     Status: None   Collection Time: 11/30/17  2:48 AM  Result Value Ref Range Status   MRSA, PCR NEGATIVE NEGATIVE Final   Staphylococcus aureus NEGATIVE NEGATIVE Final    Comment: (NOTE) The Xpert SA Assay (FDA approved for NASAL specimens in patients 68 years of age and older), is one component of a comprehensive surveillance program. It is not intended to diagnose infection nor to guide or monitor treatment. Performed at [facility], [place] [address] [place], [place] [address]          Radiology Studies: Dg Chest 2 View  Result Date: 11/29/2017 CLINICAL DATA:  Fall 6 days ago injuring left hip. EXAM: CHEST - 2 VIEW COMPARISON:  May 17, 2017 FINDINGS: Stable cardiomegaly. The hila, mediastinum, right-sided pacemaker,  and pleura are normal. IMPRESSION: No active cardiopulmonary disease. Electronically Signed   By: Dorise Bullion III M.D   On: 11/29/2017 20:54   Dg Hip Port Unilat With Pelvis 1v Left  Result Date: 11/29/2017 CLINICAL DATA:  Preoperative left hip radiograph for left femoral neck fracture. EXAM: DG HIP (WITH OR WITHOUT PELVIS) 1V PORT LEFT COMPARISON:  None. FINDINGS: There is suggestion of a minimally displaced transcervical fracture through the left femoral neck, which may extend to the trochanters. The left femoral head remains seated at the acetabulum. The right hip joint is unremarkable. Degenerative change is noted at the lower lumbar spine. The sacroiliac joints are grossly unremarkable. The visualized bowel gas pattern is grossly unremarkable. IMPRESSION: Minimally displaced transcervical fracture through the left femoral neck, which may extend to the trochanters. Electronically Signed   By: Garald Balding  M.D.   On: 11/29/2017 23:29        Scheduled Meds: . aspirin  81 mg Oral [id number]  . clonazePAM  2 mg Oral QHS  . cycloSPORINE  1 drop Both Eyes BID  . doxazosin  2 mg Oral Daily  . finasteride  5 mg Oral Daily  . furosemide  40 mg Oral Daily  . galantamine  8 mg Oral BID  . ipratropium  2 spray Each Nare BID  . metoprolol tartrate  25 mg Oral BID  . multivitamin with minerals  1 tablet Oral Daily   Continuous Infusions: .  ceFAZolin (ANCEF) IV       LOS: 1 day     Vernell Leep, MD, FACP, [facility]. [facility] Pager [phone]  If 7PM-7AM, please contact night-coverage [email or web address] Password [email or web address] 11/30/2017, 12:55 PM

## 2017-11-30 NOTE — Progress Notes (Signed)
  Echocardiogram 2D Echocardiogram has been performed.  [name] 11/30/2017, 4:12 PM

## 2017-11-30 NOTE — Progress Notes (Signed)
Nutrition Consult/Brief Note  RD consulted via Hip Fracture Protocol.   Wt Readings from Last 15 Encounters:  11/30/17 180 lb (81.6 kg)  05/01/17 180 lb 6.4 oz (81.8 kg)  02/20/17 187 lb 3.2 oz (84.9 kg)  04/28/16 183 lb (83 kg)  03/21/16 182 lb (82.6 kg)  02/17/16 179 lb 6.4 oz (81.4 kg)  02/08/16 181 lb 12.8 oz (82.5 kg)  01/02/16 170 lb (77.1 kg)  08/26/15 174 lb (78.9 kg)  04/27/15 178 lb 3.2 oz (80.8 kg)  02/04/15 181 lb (82.1 kg)  07/28/14 181 lb 6.4 oz (82.3 kg)  06/09/14 174 lb 12.8 oz (79.3 kg)  05/26/14 174 lb 9.6 oz (79.2 kg)  06/03/13 176 lb 6.4 oz (80 kg)   Body mass index is 25.83 kg/m. Patient meets criteria for Overweight based on [place] BMI.   Pt undergoing 2D-Echo upon visit. [place] diet order is Heart Healthy. Labs and medications reviewed.   No nutrition interventions warranted at this time. If nutrition issues arise, please consult RD.   Arthur Holms, RD, LDN Pager #: [phone] After-Hours Pager #: [phone]

## 2017-12-01 ENCOUNTER — Inpatient Hospital Stay (HOSPITAL_COMMUNITY): Admission: RE | Admit: 2017-12-01 | Payer: PPO | Source: Ambulatory Visit | Admitting: Orthopedic Surgery

## 2017-12-01 LAB — BASIC METABOLIC PANEL
Anion gap: 7 (ref 5–15)
BUN: 31 mg/dL — AB (ref 6–20)
CHLORIDE: 108 mmol/L (ref 101–111)
CO2: 26 mmol/L (ref 22–32)
CREATININE: 1.56 mg/dL — AB (ref 0.61–1.24)
Calcium: 8.8 mg/dL — ABNORMAL LOW (ref 8.9–10.3)
GFR calc Af Amer: 45 mL/min — ABNORMAL LOW (ref >60)
GFR, EST NON AFRICAN AMERICAN: 39 mL/min — AB (ref >60)
Glucose, Bld: 97 mg/dL (ref 65–99)
Potassium: 4.1 mmol/L (ref 3.5–5.1)
SODIUM: 141 mmol/L (ref 135–145)

## 2017-12-01 LAB — PROTIME-INR
INR: 2.2
INR: 1.66
PROTHROMBIN TIME: 24.3 s — AB (ref 11.4–15.2)
PROTHROMBIN TIME: 19.4 s — AB (ref 11.4–15.2)

## 2017-12-01 LAB — CBC
HCT: 34.3 % — ABNORMAL LOW (ref 39.0–52.0)
Hemoglobin: 10.9 g/dL — ABNORMAL LOW (ref 13.0–17.0)
MCH: 31.7 pg (ref 26.0–34.0)
MCHC: 31.8 g/dL (ref 30.0–36.0)
MCV: 99.7 fL (ref 78.0–100.0)
PLATELETS: 181 10*3/uL (ref 150–400)
RBC: 3.44 MIL/uL — ABNORMAL LOW (ref 4.22–5.81)
RDW: 13.2 % (ref 11.5–15.5)
WBC: 4.8 10*3/uL (ref 4.0–10.5)

## 2017-12-01 MED ORDER — HEPARIN SODIUM (PORCINE) 5000 UNIT/ML IJ SOLN
5000.0000 [IU] | Freq: Three times a day (TID) | INTRAMUSCULAR | Status: DC
  Administered 2017-12-01: 5000 [IU] via SUBCUTANEOUS
  Filled 2017-12-01 (×2): qty 1

## 2017-12-01 MED ORDER — PHYTONADIONE 5 MG PO TABS
5.0000 mg | ORAL_TABLET | Freq: Once | ORAL | Status: AC
  Administered 2017-12-01: 5 mg via ORAL
  Filled 2017-12-01: qty 1

## 2017-12-01 NOTE — Progress Notes (Signed)
PROGRESS NOTE   [name]  DJM:[id number]    DOB: 06/17/1930    DOA: 11/29/2017  PCP: [name], MD   I have briefly reviewed patients previous medical records in [facility].  Brief Narrative:  84 year old married male, physically active and independent, PMH of complete heart block status post permanent pacemaker, last generator changed 2013 (Dr. Rayann Heman, EP cardiology), dementia, GERD, HTN, OSA on CPAP, permanent A. fib on Coumadin, transitional cell carcinoma status post right nephrectomy, stage III chronic kidney disease, stroke/TIA, frequent falls (3 this year) missed a step and fell on a concrete surface 5 days PTA leading to left hip pain and fracture, seen at orthopedic office and sent to the hospital for admission.  Orthopedics consulted and plan surgical repair pending reversal of Coumadin anticoagulation.  INR now down to 1.6.  Orthopedics had postponed surgery until 6/3.   Assessment & Plan:   Principal Problem:   Closed left hip fracture (HCC) Active Problems:   Obstructive sleep apnea   FIBRILLATION, ATRIAL   Long term ([place]) use of anticoagulants   Pacemaker-Medtronic   Lower leg edema   Fall   Essential hypertension   Stroke (cerebrum) (HCC)   BPH (benign prostatic hyperplasia)   Dementia   CKD (chronic kidney disease), stage III (HCC)   Closed right hip fracture, initial encounter ([place])   Diarrhea   Left hip fracture: Sustained after mechanical fall at home.  Orthopedics consultation appreciated and plan for surgery when INR is down.  Based on available data, patient is at moderate risk for perioperative CV events and may proceed with surgery without any further cardiac work-up with close perioperative monitoring.  Pain controlled.  Finally INR down to 1.6.  Orthopedics follow-up appreciated and have rescheduled surgery to 12/04/2017.  Mechanical fall: As per spouse, patient has sustained 3 falls this year.  During the first fall, he had sustained  extensive trauma/hematoma to right upper extremity but did not require surgery.  PT and OT evaluation.  Permanent atrial fibrillation: CHA2DS2-VASc Score is 5.  Patient on Coumadin at home.  Presented with INR of 2.85.  Continue metoprolol.  Patient required vitamin K 10 mg, 2.5 mg, 5 mg and INR down to 1.6.  Resume Coumadin postop when okay with orthopedics.  Heparin DVT prophylaxis until then.  Status post PPM for complete heart block: As per spouse, initial pacemaker was placed approximately 31 years ago and has had a changed about 3 or 4 times.  Stable.  TTE 5/30 shows LVEF 60-65%.  Dementia: Mild.  Mental status at baseline.  Essential hypertension: Controlled.  OSA on CPAP: Stable.  Status post right nephrectomy for TCC/stage III chronic kidney disease: Baseline creatinine prior to admission was 1.8 but 1.45 here.  Follows with urology annually/Dr. Alinda Money and gets cystoscopy.  Stable.  History of TIA/stroke: Coumadin on hold for surgery and to be resumed post surgery.  Continue aspirin.  Macrocytic anemia: Unclear etiology.  Stable.  Lower extremity edema: Unclear etiology.  More on left than right which may be related to his Dom left hip fracture.  Does not carry CHF diagnosis.  Remains on Lasix, continue.  TTE 01/04/2016 showed LVEF 50-55%.  Therapeutically anticoagulated PTA and thereby low index of suspicion for DVT.  Lower extremity venous Dopplers negative for DVT.  Diarrhea: Patient reports chronic intermittent loose stools for for which he takes over-the-counter diarrhea medicines.  Low index of suspicion for C. difficile.  PRN Imodium.   DVT prophylaxis: SCDs.  INR  still therapeutic. Code Status: Full Family Communication: None at bedside today. Disposition: Determine, will likely need SNF when medically ready.   Consultants:  Orthopedics  Procedures:  None  Antimicrobials:  None   Subjective: Seen earlier this morning.  Wished him a happy birthday!  Describe  chronic diarrhea as noted above.  Mild left hip pain, controlled with pain medicines.  Denies any other complaints.  As per RN, no acute issues reported.  ROS: As above.  Objective:  Vitals:   11/30/17 2009 11/30/17 2317 12/01/17 0437 12/01/17 1415  BP: 136/70  (!) 141/83 122/69  Pulse: 70 78 69 70  Resp: 16 16 16 18   Temp: 99.8 F (37.7 C)  98.7 F (37.1 C) 98.7 F (37.1 C)  TempSrc: Oral  Oral Oral  SpO2: 96% 94% 100% 97%  Weight:      Height:        Examination: No significant change in exam compared to yesterday.  General exam: Pleasant elderly male, moderately built and nourished lying comfortably propped up in bed. Respiratory system: Clear to auscultation. Respiratory effort normal. Cardiovascular system: S1 & S2 heard, RRR. No JVD, murmurs, rubs, gallops or clicks.  1+ pitting bilateral leg edema left > right.  Telemetry personally reviewed: V paced rhythm. Gastrointestinal system: Abdomen is nondistended, soft and nontender. No organomegaly or masses felt. Normal bowel sounds heard. Central nervous system: Alert and oriented. No focal neurological deficits. Extremities: Symmetric 5 x 5 power except left lower extremity where movements are restricted by left hip pain from fracture.. Skin: No rashes, lesions or ulcers Psychiatry: Judgement and insight appear normal. Mood & affect appropriate.     Data Reviewed: I have personally reviewed following labs and imaging studies  CBC: Recent Labs  Lab 11/29/17 2003 11/30/17 0334 12/01/17 0402  WBC 4.8 4.7 4.8  NEUTROABS 2.7  --   --   HGB 11.5* 10.6* 10.9*  HCT 37.2* 34.1* 34.3*  MCV 101.9* 100.9* 99.7  PLT 177 174 785   Basic Metabolic Panel: Recent Labs  Lab 11/29/17 2003 11/30/17 0334 12/01/17 0402  NA 144 143 141  K 4.3 4.4 4.1  CL 109 109 108  CO2 27 27 26   GLUCOSE 101* 129* 97  BUN 32* 29* 31*  CREATININE 1.58* 1.45* 1.56*  CALCIUM 9.4 9.1 8.8*   Liver Function Tests: Recent Labs  Lab  11/29/17 2003  AST 21  ALT 16*  ALKPHOS 102  BILITOT 0.9  PROT 6.8  ALBUMIN 3.6   Coagulation Profile: Recent Labs  Lab 11/29/17 2003 11/30/17 0334 11/30/17 1310 12/01/17 0402 12/01/17 1234  INR 2.85 3.36 2.83 2.20 1.66   CBG: No results for input(s): GLUCAP in the last 168 hours.  Recent Results (from the past 240 hour(s))  Surgical pcr screen     Status: None   Collection Time: 11/30/17  2:48 AM  Result Value Ref Range Status   MRSA, PCR NEGATIVE NEGATIVE Final   Staphylococcus aureus NEGATIVE NEGATIVE Final    Comment: (NOTE) The Xpert SA Assay (FDA approved for NASAL specimens in patients 44 years of age and older), is one component of a comprehensive surveillance program. It is not intended to diagnose infection nor to guide or monitor treatment. Performed at [facility], [place] [address] [place], [place] [address]          Radiology Studies: Dg Chest 2 View  Result Date: 11/29/2017 CLINICAL DATA:  Fall 6 days ago injuring left hip. EXAM: CHEST - 2  VIEW COMPARISON:  May 17, 2017 FINDINGS: Stable cardiomegaly. The hila, mediastinum, right-sided pacemaker, and pleura are normal. IMPRESSION: No active cardiopulmonary disease. Electronically Signed   By: Dorise Bullion III M.D   On: 11/29/2017 20:54   Dg Hip Port Unilat With Pelvis 1v Left  Result Date: 11/29/2017 CLINICAL DATA:  Preoperative left hip radiograph for left femoral neck fracture. EXAM: DG HIP (WITH OR WITHOUT PELVIS) 1V PORT LEFT COMPARISON:  None. FINDINGS: There is suggestion of a minimally displaced transcervical fracture through the left femoral neck, which may extend to the trochanters. The left femoral head remains seated at the acetabulum. The right hip joint is unremarkable. Degenerative change is noted at the lower lumbar spine. The sacroiliac joints are grossly unremarkable. The visualized bowel gas pattern is grossly unremarkable. IMPRESSION: Minimally displaced transcervical  fracture through the left femoral neck, which may extend to the trochanters. Electronically Signed   By: Garald Balding M.D.   On: 11/29/2017 23:29        Scheduled Meds: . aspirin  81 mg Oral BH-q7a  . clonazePAM  2 mg Oral QHS  . cycloSPORINE  1 drop Both Eyes BID  . doxazosin  2 mg Oral Daily  . finasteride  5 mg Oral Daily  . furosemide  40 mg Oral Daily  . galantamine  8 mg Oral BID  . ipratropium  2 spray Each Nare BID  . metoprolol tartrate  25 mg Oral BID  . multivitamin with minerals  1 tablet Oral Daily   Continuous Infusions:    LOS: 2 days     Vernell Leep, MD, FACP, [facility]. [facility] Pager [phone]  If 7PM-7AM, please contact night-coverage [email or web address] Password [facility] 12/01/2017, 6:59 PM

## 2017-12-01 NOTE — Progress Notes (Addendum)
Subjective: L fem neck fx awaiting surgery Pain well controlled INR down from yesterday but still elevated at 2.2 No c/o  Objective: Vital signs in last 24 hours: Temp:  [98.7 F (37.1 C)-99.8 F (37.7 C)] 98.7 F (37.1 C) (05/31 0437) Pulse Rate:  [69-78] 69 (05/31 0437) Resp:  [16] 16 (05/31 0437) BP: (136-141)/(70-83) 141/83 (05/31 0437) SpO2:  [94 %-100 %] 100 % (05/31 0437) Weight:  [81.6 kg (180 lb)] 81.6 kg (180 lb) (05/30 1554)  Intake/Output from previous day: 05/30 0701 - 05/31 0700 In: 240 [P.O.:240] Out: 953 [Urine:950; Stool:3] Intake/Output this shift: Total I/O In: -  Out: 600 [Urine:600]  Recent Labs    11/29/17 2003 11/30/17 0334 12/01/17 0402  HGB 11.5* 10.6* 10.9*   Recent Labs    11/30/17 0334 12/01/17 0402  WBC 4.7 4.8  RBC 3.38* 3.44*  HCT 34.1* 34.3*  PLT 174 181   Recent Labs    11/30/17 0334 12/01/17 0402  NA 143 141  K 4.4 4.1  CL 109 108  CO2 27 26  BUN 29* 31*  CREATININE 1.45* 1.56*  GLUCOSE 129* 97  CALCIUM 9.1 8.8*   Recent Labs    11/30/17 1310 12/01/17 0402  INR 2.83 2.20    Neurologically intact ABD soft Neurovascular intact Sensation intact distally Intact pulses distally Dorsiflexion/Plantar flexion intact No cellulitis present Compartment soft no sign of DVT L leg shortened and ER   Assessment/Plan: L fem neck fx  Discussed with Dr. Lyla Glassing, INR still too elevated for surgery today Will resume cardiac diet Anticipate that by Monday will be able to proceed with surgery Pt moved to OR schedule Monday for L THA - AA Appreciate hospitalist input, workup, and assistance with optimizing INR for surgery  Lacie Draft M. 12/01/2017, 9:04 AM   I spoke with the patient's daughter, Colletta Maryland, who is a NP. Discussed surgery, delay due to INR, rescheding for Monday, risks, post-op protocols. She has inquired about CIR as an option post-op. Pt was independent prior to injury but lives with his wife who  has MS and they are concerned about his help at home. I advised her we would see how he progresses with PT post-op and their recommendations.

## 2017-12-02 DIAGNOSIS — S72002D Fracture of unspecified part of neck of left femur, subsequent encounter for closed fracture with routine healing: Secondary | ICD-10-CM

## 2017-12-02 LAB — CBC
HEMATOCRIT: 37.7 % — AB (ref 39.0–52.0)
Hemoglobin: 11.9 g/dL — ABNORMAL LOW (ref 13.0–17.0)
MCH: 31.6 pg (ref 26.0–34.0)
MCHC: 31.6 g/dL (ref 30.0–36.0)
MCV: 100 fL (ref 78.0–100.0)
Platelets: 190 10*3/uL (ref 150–400)
RBC: 3.77 MIL/uL — ABNORMAL LOW (ref 4.22–5.81)
RDW: 13 % (ref 11.5–15.5)
WBC: 4.1 10*3/uL (ref 4.0–10.5)

## 2017-12-02 LAB — HEPARIN LEVEL (UNFRACTIONATED): HEPARIN UNFRACTIONATED: 0.15 [IU]/mL — AB (ref 0.30–0.70)

## 2017-12-02 LAB — BASIC METABOLIC PANEL
ANION GAP: 6 (ref 5–15)
BUN: 26 mg/dL — ABNORMAL HIGH (ref 6–20)
CALCIUM: 9.2 mg/dL (ref 8.9–10.3)
CHLORIDE: 106 mmol/L (ref 101–111)
CO2: 28 mmol/L (ref 22–32)
Creatinine, Ser: 1.54 mg/dL — ABNORMAL HIGH (ref 0.61–1.24)
GFR calc Af Amer: 45 mL/min — ABNORMAL LOW (ref >60)
GFR calc non Af Amer: 39 mL/min — ABNORMAL LOW (ref >60)
Glucose, Bld: 109 mg/dL — ABNORMAL HIGH (ref 65–99)
POTASSIUM: 4.4 mmol/L (ref 3.5–5.1)
Sodium: 140 mmol/L (ref 135–145)

## 2017-12-02 LAB — PROTIME-INR
INR: 1.31
Prothrombin Time: 16.2 seconds — ABNORMAL HIGH (ref 11.4–15.2)

## 2017-12-02 MED ORDER — HEPARIN BOLUS VIA INFUSION
1000.0000 [IU] | Freq: Once | INTRAVENOUS | Status: AC
  Administered 2017-12-02: 1000 [IU] via INTRAVENOUS
  Filled 2017-12-02: qty 1000

## 2017-12-02 MED ORDER — HEPARIN (PORCINE) IN NACL 100-0.45 UNIT/ML-% IJ SOLN
1400.0000 [IU]/h | INTRAMUSCULAR | Status: AC
  Administered 2017-12-02: 1050 [IU]/h via INTRAVENOUS
  Administered 2017-12-04: 1400 [IU]/h via INTRAVENOUS
  Filled 2017-12-02 (×3): qty 250

## 2017-12-02 NOTE — Progress Notes (Signed)
ANTICOAGULATION CONSULT NOTE - Initial Consult  Pharmacy Consult for heparin Indication: atrial fibrillation  No Known Allergies  Patient Measurements: Height: 5\' 10"  (177.8 cm) Weight: 180 lb (81.6 kg) IBW/kg (Calculated) : 73 Heparin Dosing Weight: 81.6kg  Vital Signs: Temp: 98.2 F (36.8 C) (06/01 1807) Temp Source: Oral (06/01 1807) BP: 141/74 (06/01 1807) Pulse Rate: 70 (06/01 1807)  Labs: Recent Labs    11/29/17 2201  11/30/17 0334  12/01/17 0402 12/01/17 1234 12/02/17 1021 12/02/17 1959  HGB  --    < > 10.6*  --  10.9*  --  11.9*  --   HCT  --   --  34.1*  --  34.3*  --  37.7*  --   PLT  --   --  174  --  181  --  190  --   APTT 40*  --   --   --   --   --   --   --   LABPROT  --   --  33.8*   < > 24.3* 19.4* 16.2*  --   INR  --   --  3.36   < > 2.20 1.66 1.31  --   HEPARINUNFRC  --   --   --   --   --   --   --  0.15*  CREATININE  --   --  1.45*  --  1.56*  --  1.54*  --    < > = values in this interval not displayed.    Estimated Creatinine Clearance: 35.6 mL/min (A) (by C-G formula based on SCr of 1.54 mg/dL (H)).  Assessment: 34 YOM on warfarin PTA for AFib, s/p fall with L hip fracture- to have surgery on Monday, bridging with heparin until OR.  Initial HL 0.15  Goal of Therapy:  Heparin level 0.3-0.7 units/ml Monitor platelets by anticoagulation protocol: Yes   Plan:  Bolus heparin 1000 units x 1 Increase gtt to 1200 units/hr Daily hep lvl cbc Plan to stop heparin before OR- will plan to turn off at 0600 on Monday 6/3  Levester Fresh, PharmD, BCPS, [place] Clinical Pharmacist Clinical phone for 12/02/2017 from 0830 - 2100: [phone] If after 2100, please call main pharmacy at: [phone] 12/02/2017 8:51 PM

## 2017-12-02 NOTE — Progress Notes (Signed)
Called PA on call due to family member (daughter) being upset that the patients surgery time is Monday at [facility]. RN explained to patient and family that due to INR level that is why surgery was postponed. Family and patient verbally understood, however, insisted that RN calls doctor. PA on call stated she will leave a note for Dr. Lyla Glassing about request and the family can feel free to call the office at 8am Monday morning. RN notified family and patient of this information. Will continue to monitor patient.

## 2017-12-02 NOTE — Progress Notes (Signed)
PROGRESS NOTE   [name]  YCX:[id number]    DOB: [birth year]    DOA: 11/29/2017  PCP: [name], MD   I have briefly reviewed patients previous medical records in [facility].  Brief Narrative:  84 year old married male, physically active and independent, PMH of complete heart block status post permanent pacemaker, last generator changed 2013 (Dr. Rayann Heman, EP cardiology), dementia, GERD, HTN, OSA on CPAP, permanent A. fib on Coumadin, transitional cell carcinoma status post right nephrectomy, stage III chronic kidney disease, stroke/TIA, frequent falls (3 this year) missed a step and fell on a concrete surface 5 days PTA leading to left hip pain and fracture, seen at orthopedic office and sent to the hospital for admission.  Orthopedics consulted and plan surgical repair pending reversal of Coumadin anticoagulation.  INR now down to 1.3.  Orthopedics has postponed surgery until 6/3.   Assessment & Plan:   Principal Problem:   Closed left hip fracture (HCC) Active Problems:   Obstructive sleep apnea   FIBRILLATION, ATRIAL   Long term ([place]) use of anticoagulants   Pacemaker-Medtronic   Lower leg edema   Fall   Essential hypertension   Stroke (cerebrum) (HCC)   BPH (benign prostatic hyperplasia)   Dementia   CKD (chronic kidney disease), stage III (HCC)   Closed right hip fracture, initial encounter ([place])   Diarrhea   Left hip fracture: Sustained after mechanical fall at home.  Orthopedics consultation appreciated and plan for surgery when INR is down.  Based on available data, patient is at moderate risk for perioperative CV events and may proceed with surgery without any further cardiac work-up with close perioperative monitoring.  Pain controlled.  Finally INR down to 1.6 on 5/31 afternoon but Orthopedics rescheduled surgery to 12/04/2017.  Mechanical fall: As per spouse, patient has sustained 3 falls this year.  During the first fall, he had sustained extensive  trauma/hematoma to right upper extremity but did not require surgery.  PT and OT evaluation.  Permanent atrial fibrillation: CHA2DS2-VASc Score is 5.  Patient on Coumadin at home.  Presented with INR of 2.85.  Continue metoprolol.  Patient required vitamin K 10 mg, 2.5 mg, 5 mg and INR down to 1.6> 0.3.  Resume Coumadin postop when okay with orthopedics.  Discussed with Cardiology team, started IV heparin per pharmacy due to embolic risk, which can be stopped prior to surgery.  Status post PPM for complete heart block: As per spouse, initial pacemaker was placed approximately 31 years ago and has had a changed about 3 or 4 times.  Stable.  TTE 5/30 shows LVEF 60-65%.  Dementia: Mild.  Mental status at baseline.  Essential hypertension: Controlled.  OSA on CPAP: Stable.  Status post right nephrectomy for TCC/stage III chronic kidney disease: Baseline creatinine prior to admission was 1.8 but 1.45 here.  Follows with urology annually/Dr. Alinda Money and gets cystoscopy.  Stable.  History of TIA/stroke: Coumadin on hold for surgery and to be resumed post surgery.  Continue aspirin.  Now on IV heparin.  Macrocytic anemia: Unclear etiology.  Stable.  Lower extremity edema: Unclear etiology.  More on left than right which may be related to his [place] left hip fracture.  Does not carry CHF diagnosis.  Remains on Lasix, continue.  TTE 01/04/2016 showed LVEF 50-55%.  Therapeutically anticoagulated PTA and thereby low index of suspicion for DVT.  Lower extremity venous Dopplers negative for DVT.  Now on IV heparin.  Diarrhea: Patient reports chronic intermittent loose stools for  for which he takes over-the-counter diarrhea medicines.  Low index of suspicion for C. difficile.  PRN Imodium.   DVT prophylaxis: SCDs.  Currently on IV heparin. Code Status: Full Family Communication: Discussed with daughter who is a NP in internal medicine at [facility] system.  I updated care and answered  questions. Disposition: Determine, will likely need SNF when medically ready.   Consultants:  Orthopedics  Procedures:  None  Antimicrobials:  None   Subjective: Left hip pain controlled.  Frustrated that he has to wait until Monday for his surgery.  No chest pain, dyspnea or palpitations reported.  ROS: As above.  Objective:  Vitals:   12/01/17 0437 12/01/17 1415 12/01/17 2108 12/02/17 0517  BP: (!) 141/83 122/69 139/63 135/79  Pulse: 69 70 69 70  Resp: 16 18    Temp: 98.7 F (37.1 C) 98.7 F (37.1 C) 98.4 F (36.9 C) 98.5 F (36.9 C)  TempSrc: Oral Oral Oral Oral  SpO2: 100% 97% 100% 99%  Weight:      Height:        Examination:   General exam: Pleasant elderly male, moderately built and nourished sitting up in bed getting ready to eat breakfast this morning. Respiratory system: Clear to auscultation. Respiratory effort normal.  Stable. Cardiovascular system: S1 & S2 heard, RRR. No JVD, murmurs, rubs, gallops or clicks.  1+ pitting bilateral leg edema left > right.  Telemetry personally reviewed: V paced rhythm.?  Underlying A. fib.  6 beat NSVT noted. Gastrointestinal system: Abdomen is nondistended, soft and nontender. No organomegaly or masses felt. Normal bowel sounds heard.  Stable. Central nervous system: Alert and oriented x2. No focal neurological deficits.  Stable. Extremities: Symmetric 5 x 5 power except left lower extremity where movements are restricted by left hip pain from fracture.  Stable without change. Skin: No rashes, lesions or ulcers Psychiatry: Judgement and insight appear normal. Mood & affect appropriate.     Data Reviewed: I have personally reviewed following labs and imaging studies  CBC: Recent Labs  Lab 11/29/17 2003 11/30/17 0334 12/01/17 0402 12/02/17 1021  WBC 4.8 4.7 4.8 4.1  NEUTROABS 2.7  --   --   --   HGB 11.5* 10.6* 10.9* 11.9*  HCT 37.2* 34.1* 34.3* 37.7*  MCV 101.9* 100.9* 99.7 100.0  PLT 177 174 181 885    Basic Metabolic Panel: Recent Labs  Lab 11/29/17 2003 11/30/17 0334 12/01/17 0402 12/02/17 1021  NA 144 143 141 140  K 4.3 4.4 4.1 4.4  CL 109 109 108 106  CO2 27 27 26 28   GLUCOSE 101* 129* 97 109*  BUN 32* 29* 31* 26*  CREATININE 1.58* 1.45* 1.56* 1.54*  CALCIUM 9.4 9.1 8.8* 9.2   Liver Function Tests: Recent Labs  Lab 11/29/17 2003  AST 21  ALT 16*  ALKPHOS 102  BILITOT 0.9  PROT 6.8  ALBUMIN 3.6   Coagulation Profile: Recent Labs  Lab 11/30/17 0334 11/30/17 1310 12/01/17 0402 12/01/17 1234 12/02/17 1021  INR 3.36 2.83 2.20 1.66 1.31   CBG: No results for input(s): GLUCAP in the last 168 hours.  Recent Results (from the past 240 hour(s))  Surgical pcr screen     Status: None   Collection Time: 11/30/17  2:48 AM  Result Value Ref Range Status   MRSA, PCR NEGATIVE NEGATIVE Final   Staphylococcus aureus NEGATIVE NEGATIVE Final    Comment: (NOTE) The Xpert SA Assay (FDA approved for NASAL specimens in patients 51 years of age  and older), is one component of a comprehensive surveillance program. It is not intended to diagnose infection nor to guide or monitor treatment. Performed at [facility], [place] [address], [place], [place] [address]          Radiology Studies: No results found.      Scheduled Meds: . aspirin  81 mg Oral BH-q7a  . clonazePAM  2 mg Oral QHS  . cycloSPORINE  1 drop Both Eyes BID  . doxazosin  2 mg Oral Daily  . finasteride  5 mg Oral Daily  . furosemide  40 mg Oral Daily  . galantamine  8 mg Oral BID  . ipratropium  2 spray Each Nare BID  . metoprolol tartrate  25 mg Oral BID  . multivitamin with minerals  1 tablet Oral Daily   Continuous Infusions: . heparin 1,050 Units/hr (12/02/17 1153)     LOS: 3 days     Vernell Leep, MD, FACP, [facility]. [facility] Pager [phone]  If 7PM-7AM, please contact night-coverage [email or web address] Password [facility] 12/02/2017, 3:18 PM

## 2017-12-02 NOTE — Progress Notes (Signed)
    Subjective:   Procedure(s) (LRB): TOTAL HIP ARTHROPLASTY ANTERIOR APPROACH (Left) Patient reports pain as 3 on 0-10 scale.   Denies CP or SOB.  Voiding without difficulty. Positive flatus. Objective: Vital signs in last 24 hours: Temp:  [98.4 F (36.9 C)-98.7 F (37.1 C)] 98.5 F (36.9 C) (06/01 0517) Pulse Rate:  [69-70] 70 (06/01 0517) Resp:  [18] 18 (05/31 1415) BP: (122-139)/(63-79) 135/79 (06/01 0517) SpO2:  [97 %-100 %] 99 % (06/01 0517)  Intake/Output from previous day: 05/31 0701 - 06/01 0700 In: 480 [P.O.:480] Out: 1100 [Urine:1100] Intake/Output this shift: No intake/output data recorded.  Labs: Recent Labs    11/29/17 2003 11/30/17 0334 12/01/17 0402  HGB 11.5* 10.6* 10.9*   Recent Labs    11/30/17 0334 12/01/17 0402  WBC 4.7 4.8  RBC 3.38* 3.44*  HCT 34.1* 34.3*  PLT 174 181   Recent Labs    11/30/17 0334 12/01/17 0402  NA 143 141  K 4.4 4.1  CL 109 108  CO2 27 26  BUN 29* 31*  CREATININE 1.45* 1.56*  GLUCOSE 129* 97  CALCIUM 9.1 8.8*   Recent Labs    12/01/17 0402 12/01/17 1234  INR 2.20 1.66    Physical Exam: Neurologically intact ABD soft Intact pulses distally Dorsiflexion/Plantar flexion intact No cellulitis present Compartment soft Body mass index is 25.83 kg/m.   Assessment/Plan:   Procedure(s) (LRB): TOTAL HIP ARTHROPLASTY ANTERIOR APPROACH (Left) Await surgery for left hip fx INR - 1.66 today. Plan on surgery for Monday  [facility] D for Dr. Melina Schools [facility] [phone] 12/02/2017, 7:35 AM

## 2017-12-02 NOTE — Progress Notes (Addendum)
ANTICOAGULATION CONSULT NOTE - Initial Consult  Pharmacy Consult for heparin Indication: atrial fibrillation  No Known Allergies  Patient Measurements: Height: 5\' 10"  (177.8 cm) Weight: 180 lb (81.6 kg) IBW/kg (Calculated) : 73 Heparin Dosing Weight: 81.6kg  Vital Signs: Temp: 98.5 F (36.9 C) (06/01 0517) Temp Source: Oral (06/01 0517) BP: 135/79 (06/01 0517) Pulse Rate: 70 (06/01 0517)  Labs: Recent Labs    11/29/17 2003 11/29/17 2201 11/30/17 0334 11/30/17 1310 12/01/17 0402 12/01/17 1234  HGB 11.5*  --  10.6*  --  10.9*  --   HCT 37.2*  --  34.1*  --  34.3*  --   PLT 177  --  174  --  181  --   APTT  --  40*  --   --   --   --   LABPROT 29.7*  --  33.8* 29.6* 24.3* 19.4*  INR 2.85  --  3.36 2.83 2.20 1.66  CREATININE 1.58*  --  1.45*  --  1.56*  --     Estimated Creatinine Clearance: 35.1 mL/min (A) (by C-G formula based on SCr of 1.56 mg/dL (H)).  Assessment: 70 YOM on warfarin PTA for AFib, s/p fall with L hip fracture- to have surgery on Monday, bridging with heparin until OR. INR 1.66 after receiving vitamin K.  Hgb 11.9, plts 190- no overt bleeding noted.  Goal of Therapy:  Heparin level 0.3-0.7 units/ml Monitor platelets by anticoagulation protocol: Yes   Plan:  Start heparin infusion without bolus at 1050 units/hr Heparin level in 8h Daily heparin level and CBC Plan to stop heparin before OR- will plan to turn off at 0600 on Monday 6/3  Herminia Warren D. Dalal Livengood, PharmD, BCPS Clinical Pharmacist [phone] Please check AMION for all [place] numbers 12/02/2017 10:55 AM

## 2017-12-02 NOTE — Progress Notes (Signed)
PA contacted RN about surgery time for Monday and stated that surgery time will not be changed. Patient and family member aware of situation. Still upset they have to wait until 7pm, but RN tried to explain the process as best as possible. Family and patient verbally understood process, just frustrated about waiting.

## 2017-12-03 LAB — PROTIME-INR
INR: 1.27
Prothrombin Time: 15.7 s — ABNORMAL HIGH (ref 11.4–15.2)

## 2017-12-03 LAB — CBC
HEMATOCRIT: 34.7 % — AB (ref 39.0–52.0)
HEMOGLOBIN: 11.1 g/dL — AB (ref 13.0–17.0)
MCH: 31.9 pg (ref 26.0–34.0)
MCHC: 32 g/dL (ref 30.0–36.0)
MCV: 99.7 fL (ref 78.0–100.0)
Platelets: 203 10*3/uL (ref 150–400)
RBC: 3.48 MIL/uL — ABNORMAL LOW (ref 4.22–5.81)
RDW: 12.9 % (ref 11.5–15.5)
WBC: 4.8 10*3/uL (ref 4.0–10.5)

## 2017-12-03 LAB — BASIC METABOLIC PANEL WITH GFR
Anion gap: 6 (ref 5–15)
BUN: 32 mg/dL — ABNORMAL HIGH (ref 6–20)
CO2: 28 mmol/L (ref 22–32)
Calcium: 9.1 mg/dL (ref 8.9–10.3)
Chloride: 108 mmol/L (ref 101–111)
Creatinine, Ser: 1.64 mg/dL — ABNORMAL HIGH (ref 0.61–1.24)
GFR calc Af Amer: 42 mL/min — ABNORMAL LOW
GFR calc non Af Amer: 36 mL/min — ABNORMAL LOW
Glucose, Bld: 127 mg/dL — ABNORMAL HIGH (ref 65–99)
Potassium: 4 mmol/L (ref 3.5–5.1)
Sodium: 142 mmol/L (ref 135–145)

## 2017-12-03 LAB — HEPARIN LEVEL (UNFRACTIONATED)
Heparin Unfractionated: 0.31 [IU]/mL (ref 0.30–0.70)
Heparin Unfractionated: 0.19 [IU]/mL — ABNORMAL LOW (ref 0.30–0.70)
Heparin Unfractionated: 0.35 [IU]/mL (ref 0.30–0.70)

## 2017-12-03 MED ORDER — HYDROCORTISONE 0.5 % EX CREA
TOPICAL_CREAM | Freq: Two times a day (BID) | CUTANEOUS | Status: DC
  Administered 2017-12-03: 1 via TOPICAL
  Administered 2017-12-03 – 2017-12-09 (×8): via TOPICAL
  Filled 2017-12-03: qty 28.35

## 2017-12-03 MED ORDER — FUROSEMIDE 40 MG PO TABS
40.0000 mg | ORAL_TABLET | Freq: Every day | ORAL | Status: DC
  Administered 2017-12-05 – 2017-12-06 (×2): 40 mg via ORAL
  Filled 2017-12-03 (×3): qty 1

## 2017-12-03 NOTE — Progress Notes (Signed)
ANTICOAGULATION CONSULT NOTE  Pharmacy Consult for heparin Indication: atrial fibrillation  No Known Allergies  Patient Measurements: Height: 5\' 10"  (177.8 cm) Weight: 180 lb (81.6 kg) IBW/kg (Calculated) : 73 Heparin Dosing Weight: 81.6kg  Vital Signs: Temp: 98.4 F (36.9 C) (06/02 0542) Temp Source: Oral (06/02 0542) BP: 138/69 (06/02 0542) Pulse Rate: 72 (06/02 0542)  Labs: Recent Labs    12/01/17 0402 12/01/17 1234 12/02/17 1021 12/02/17 1959 12/03/17 0334 12/03/17 1334  HGB 10.9*  --  11.9*  --  11.1*  --   HCT 34.3*  --  37.7*  --  34.7*  --   PLT 181  --  190  --  203  --   LABPROT 24.3* 19.4* 16.2*  --  15.7*  --   INR 2.20 1.66 1.31  --  1.27  --   HEPARINUNFRC  --   --   --  0.15* 0.31 0.19*  CREATININE 1.56*  --  1.54*  --  1.64*  --     Estimated Creatinine Clearance: 33.4 mL/min (A) (by C-G formula based on SCr of 1.64 mg/dL (H)).  Assessment: 13 YOM on warfarin PTA for AFib, s/p fall with L hip fracture- to have surgery on Monday, bridging with heparin until OR. Received vitamin K on 5/29, 5/30 and 5/31. INR down to 1.27. Hgb 11., plts 203- stable, no overt bleeding noted.  Heparin level has been fluctuating- was therapeutic overnight and now has fallen again to 0.19 units/mL.  Goal of Therapy:  Heparin level 0.3-0.7 units/ml Monitor platelets by anticoagulation protocol: Yes   Plan:  Increase heparin infusion to 1400 units/hr Heparin level in 8h Will enter stop time for heparin infusion for 6/3 @ 0600 in preparation for surgery Daily heparin level and CBC starting on 6/4  Joey Hudock D. Yemariam Magar, PharmD, BCPS Clinical Pharmacist [phone] Please check AMION for all [place] numbers 12/03/2017 2:37 PM

## 2017-12-03 NOTE — Progress Notes (Signed)
ANTICOAGULATION CONSULT NOTE  Pharmacy Consult for heparin Indication: atrial fibrillation  No Known Allergies  Patient Measurements: Height: 5\' 10"  (177.8 cm) Weight: 180 lb (81.6 kg) IBW/kg (Calculated) : 73 Heparin Dosing Weight: 81.6kg  Vital Signs: Temp: 97.9 F (36.6 C) (06/02 1954) Temp Source: Oral (06/02 1954) BP: 148/80 (06/02 1954) Pulse Rate: 72 (06/02 1954)  Labs: Recent Labs    12/01/17 0402 12/01/17 1234 12/02/17 1021  12/03/17 0334 12/03/17 1334 12/03/17 2241  HGB 10.9*  --  11.9*  --  11.1*  --   --   HCT 34.3*  --  37.7*  --  34.7*  --   --   PLT 181  --  190  --  203  --   --   LABPROT 24.3* 19.4* 16.2*  --  15.7*  --   --   INR 2.20 1.66 1.31  --  1.27  --   --   HEPARINUNFRC  --   --   --    < > 0.31 0.19* 0.35  CREATININE 1.56*  --  1.54*  --  1.64*  --   --    < > = values in this interval not displayed.    Estimated Creatinine Clearance: 33.4 mL/min (A) (by C-G formula based on SCr of 1.64 mg/dL (H)).  Assessment: 52 YOM on warfarin PTA for AFib, s/p fall with L hip fracture- to have surgery on Monday, bridging with heparin until OR. Received vitamin K on 5/29, 5/30 and 5/31. INR down to 1.27. Hgb 11., plts 203- stable, no overt bleeding noted.  Heparin level has been fluctuating but is now therapeutic  Goal of Therapy:  Heparin level 0.3-0.7 units/ml Monitor platelets by anticoagulation protocol: Yes   Plan:  Continue heparin infusion at 1400 units/hr Will enter stop time for heparin infusion for 6/3 @ 0600 in preparation for surgery Daily heparin level and CBC starting on 6/4  Thanks for allowing pharmacy to be a part of this patient's care.  Excell Seltzer, PharmD Clinical Pharmacist

## 2017-12-03 NOTE — Progress Notes (Signed)
PROGRESS NOTE   [name]  PJK:[id number]    DOB: 1930/06/28    DOA: 11/29/2017  PCP: [name], MD   I have briefly reviewed patients previous medical records in [facility].  Brief Narrative:  84 year old married male, physically active and independent, PMH of complete heart block status post permanent pacemaker, last generator changed 2013 (Dr. Rayann Heman, EP cardiology), dementia, GERD, HTN, OSA on CPAP, permanent A. fib on Coumadin, transitional cell carcinoma status post right nephrectomy, stage III chronic kidney disease, stroke/TIA, frequent falls (3 this year) missed a step and fell on a concrete surface 5 days PTA leading to left hip pain and fracture, seen at orthopedic office and sent to the hospital for admission.  Orthopedics consulted and plan surgical repair pending reversal of Coumadin anticoagulation.  INR now down to 1.3.  Orthopedics has postponed surgery until 6/3.   Assessment & Plan:   Principal Problem:   Closed left hip fracture (HCC) Active Problems:   Obstructive sleep apnea   FIBRILLATION, ATRIAL   Long term ([place]) use of anticoagulants   Pacemaker-Medtronic   Lower leg edema   Fall   Essential hypertension   Stroke (cerebrum) (HCC)   BPH (benign prostatic hyperplasia)   Dementia   CKD (chronic kidney disease), stage III (HCC)   Closed right hip fracture, initial encounter ([place])   Diarrhea   Left hip fracture: Sustained after mechanical fall at home.  Orthopedics was consulted but could not operate right away due to anticoagulation from Coumadin.  Anticoagulation was reversed with vitamin K but orthopedics unfortunately were unable to reschedule surgery until 6/3 evening.  Based on available data, patient is at moderate risk for perioperative CV events and may proceed with surgery without any further cardiac work-up with close perioperative monitoring.  Pain controlled.  Orthopedic follow-up appreciated, no change in plans noted.  Mechanical  fall: As per spouse, patient has sustained 3 falls this year.  During the first fall, he had sustained extensive trauma/hematoma to right upper extremity but did not require surgery.  PT and OT evaluation after surgery.  Permanent atrial fibrillation: CHA2DS2-VASc Score is 5.  Patient on Coumadin at home.  Presented with INR of 2.85.  Continue metoprolol.  Patient required vitamin K 10 mg, 2.5 mg, 5 mg and INR down to 1.6> 1.3 >1.27.  Resume Coumadin postop when okay with orthopedics.  Discussed with Cardiology team 6/1, started IV heparin per pharmacy due to embolic risk, which can be stopped prior to surgery.  Status post PPM for complete heart block: As per spouse, initial pacemaker was placed approximately 31 years ago and has had a changed about 3 or 4 times.  Stable.  TTE 5/30 shows LVEF 60-65%.  Dementia: Mild.  Mental status at baseline.  Essential hypertension: Controlled.  OSA on CPAP: Stable.  Status post right nephrectomy for TCC/stage III chronic kidney disease: Baseline creatinine prior to admission was 1.8.  Follows with urology annually/Dr. Alinda Money and gets cystoscopy.  Creatinine was in the 1.4-1.5 range but today increased to 1.64.  Clinically not significantly volume overloaded.  Hold Lasix for today.  Encouraged increased oral fluid intake.  Follow BMP in a.m.  History of TIA/stroke: Coumadin on hold for surgery and to be resumed post surgery.  Continue aspirin.  Now on IV heparin.  Macrocytic anemia: Unclear etiology.  Stable.  Lower extremity edema: Unclear etiology.  More on left than right which may be related to his [place] left hip fracture.  Does not  carry CHF diagnosis.  Remains on Lasix, continue.  TTE 01/04/2016 showed LVEF 50-55%.  Therapeutically anticoagulated PTA and thereby low index of suspicion for DVT.  Lower extremity venous Dopplers negative for DVT.  Now on IV heparin.  Diarrhea: Patient reports chronic intermittent loose stools for for which he takes  over-the-counter diarrhea medicines.  Low index of suspicion for C. difficile.  PRN Imodium.   DVT prophylaxis: SCDs.  Currently on IV heparin. Code Status: Full Family Communication: None at bedside today. Disposition: Determine, will likely need SNF when medically ready.   Consultants:  Orthopedics  Procedures:  None  Antimicrobials:  None   Subjective: Apart from left hip pain which is controlled with medications, no other complaints reported.  Awaiting surgery for tomorrow.  Patient showed a newspaper clip from 1977 with his picture and article regarding a golfing event.  As per nursing, not using much opioids.  No other acute issues reported.  ROS: As above.  Objective:  Vitals:   12/02/17 0517 12/02/17 1807 12/02/17 2115 12/03/17 0542  BP: 135/79 (!) 141/74 128/67 138/69  Pulse: 70 70 (!) 59 72  Resp:      Temp: 98.5 F (36.9 C) 98.2 F (36.8 C) 97.8 F (36.6 C) 98.4 F (36.9 C)  TempSrc: Oral Oral Oral Oral  SpO2: 99% 98% 98% 98%  Weight:      Height:        Examination: No significant change in clinical exam compared to yesterday.  General exam: Pleasant elderly male, moderately built and nourished sitting up in bed getting ready to eat breakfast this morning. Respiratory system: Clear to auscultation. Respiratory effort normal.  Stable. Cardiovascular system: S1 & S2 heard, RRR. No JVD, murmurs, rubs, gallops or clicks.  1+ pitting bilateral leg edema left > right.  Telemetry personally reviewed: V paced rhythm.?  Underlying A. fib.   Gastrointestinal system: Abdomen is nondistended, soft and nontender. No organomegaly or masses felt. Normal bowel sounds heard.  Stable. Central nervous system: Alert and oriented x2. No focal neurological deficits.  Stable. Extremities: Symmetric 5 x 5 power except left lower extremity where movements are restricted by left hip pain from fracture.  Stable without change. Skin: No rashes, lesions or ulcers Psychiatry:  Judgement and insight appear normal. Mood & affect appropriate.     Data Reviewed: I have personally reviewed following labs and imaging studies  CBC: Recent Labs  Lab 11/29/17 2003 11/30/17 0334 12/01/17 0402 12/02/17 1021 12/03/17 0334  WBC 4.8 4.7 4.8 4.1 4.8  NEUTROABS 2.7  --   --   --   --   HGB 11.5* 10.6* 10.9* 11.9* 11.1*  HCT 37.2* 34.1* 34.3* 37.7* 34.7*  MCV 101.9* 100.9* 99.7 100.0 99.7  PLT 177 174 181 190 412   Basic Metabolic Panel: Recent Labs  Lab 11/29/17 2003 11/30/17 0334 12/01/17 0402 12/02/17 1021 12/03/17 0334  NA 144 143 141 140 142  K 4.3 4.4 4.1 4.4 4.0  CL 109 109 108 106 108  CO2 27 27 26 28 28   GLUCOSE 101* 129* 97 109* 127*  BUN 32* 29* 31* 26* 32*  CREATININE 1.58* 1.45* 1.56* 1.54* 1.64*  CALCIUM 9.4 9.1 8.8* 9.2 9.1   Liver Function Tests: Recent Labs  Lab 11/29/17 2003  AST 21  ALT 16*  ALKPHOS 102  BILITOT 0.9  PROT 6.8  ALBUMIN 3.6   Coagulation Profile: Recent Labs  Lab 11/30/17 1310 12/01/17 0402 12/01/17 1234 12/02/17 1021 12/03/17 0334  INR 2.83  2.20 1.66 1.31 1.27   CBG: No results for input(s): GLUCAP in the last 168 hours.  Recent Results (from the past 240 hour(s))  Surgical pcr screen     Status: None   Collection Time: 11/30/17  2:48 AM  Result Value Ref Range Status   MRSA, PCR NEGATIVE NEGATIVE Final   Staphylococcus aureus NEGATIVE NEGATIVE Final    Comment: (NOTE) The Xpert SA Assay (FDA approved for NASAL specimens in patients 29 years of age and older), is one component of a comprehensive surveillance program. It is not intended to diagnose infection nor to guide or monitor treatment. Performed at [facility], [place] [address] [place], [place] [address]          Radiology Studies: No results found.      Scheduled Meds: . aspirin  81 mg Oral BH-q7a  . clonazePAM  2 mg Oral QHS  . cycloSPORINE  1 drop Both Eyes BID  . doxazosin  2 mg Oral Daily  . finasteride  5 mg  Oral Daily  . [START ON 12/04/2017] furosemide  40 mg Oral Daily  . galantamine  8 mg Oral BID  . hydrocortisone cream   Topical BID  . ipratropium  2 spray Each Nare BID  . metoprolol tartrate  25 mg Oral BID  . multivitamin with minerals  1 tablet Oral Daily   Continuous Infusions: . heparin 1,200 Units/hr (12/02/17 2050)     LOS: 4 days     Vernell Leep, MD, FACP, [facility]. [facility] Pager [phone]  If 7PM-7AM, please contact night-coverage [email or web address] Password [facility] 12/03/2017, 11:44 AM

## 2017-12-03 NOTE — Progress Notes (Signed)
ANTICOAGULATION CONSULT NOTE - Follow Up Consult  Pharmacy Consult for heparin Indication: atrial fibrillation  Labs: Recent Labs    12/01/17 0402 12/01/17 1234 12/02/17 1021 12/02/17 1959 12/03/17 0334  HGB 10.9*  --  11.9*  --  11.1*  HCT 34.3*  --  37.7*  --  34.7*  PLT 181  --  190  --  203  LABPROT 24.3* 19.4* 16.2*  --  15.7*  INR 2.20 1.66 1.31  --  1.27  HEPARINUNFRC  --   --   --  0.15* 0.31  CREATININE 1.56*  --  1.54*  --  1.64*    Assessment/Plan:  84yo male therapeutic on heparin after rate change. Will continue gtt at [place] rate and confirm stable with additional level.     Wynona Neat, PharmD, BCPS  12/03/2017,7:12 AM

## 2017-12-03 NOTE — Progress Notes (Signed)
Subjective: Hip pain   Objective: Vital signs in last 24 hours: Temp:  [97.8 F (36.6 C)-98.4 F (36.9 C)] 98.4 F (36.9 C) (06/02 0542) Pulse Rate:  [59-72] 72 (06/02 0542) BP: (128-141)/(67-74) 138/69 (06/02 0542) SpO2:  [98 %] 98 % (06/02 0542)  Intake/Output from previous day: No intake/output data recorded. Intake/Output this shift: No intake/output data recorded.  Recent Labs    12/01/17 0402 12/02/17 1021 12/03/17 0334  HGB 10.9* 11.9* 11.1*   Recent Labs    12/02/17 1021 12/03/17 0334  WBC 4.1 4.8  RBC 3.77* 3.48*  HCT 37.7* 34.7*  PLT 190 203   Recent Labs    12/02/17 1021 12/03/17 0334  NA 140 142  K 4.4 4.0  CL 106 108  CO2 28 28  BUN 26* 32*  CREATININE 1.54* 1.64*  GLUCOSE 109* 127*  CALCIUM 9.2 9.1   Recent Labs    12/02/17 1021 12/03/17 0334  INR 1.31 1.27    Neurologically intact Sensation intact distally Intact pulses distally Compartment soft      Assessment/Plan: Left hip fracture pending THA tomorrow INR normalized. NPO after MN. D/C heparin prior to OR.   Jamai Dolce C 12/03/2017, 8:30 AM

## 2017-12-04 ENCOUNTER — Inpatient Hospital Stay (HOSPITAL_COMMUNITY): Payer: PPO | Admitting: Certified Registered"

## 2017-12-04 ENCOUNTER — Encounter (HOSPITAL_COMMUNITY): Payer: Self-pay | Admitting: Orthopedic Surgery

## 2017-12-04 ENCOUNTER — Inpatient Hospital Stay (HOSPITAL_COMMUNITY): Payer: PPO

## 2017-12-04 ENCOUNTER — Other Ambulatory Visit: Payer: Self-pay

## 2017-12-04 ENCOUNTER — Encounter (HOSPITAL_COMMUNITY)
Admission: EM | Disposition: A | Discharge status: Skilled Nursing Facility | Payer: Self-pay | Source: Home / Self Care | Attending: Internal Medicine

## 2017-12-04 HISTORY — PX: TOTAL HIP ARTHROPLASTY: SHX124

## 2017-12-04 LAB — CBC
HCT: 34.8 % — ABNORMAL LOW (ref 39.0–52.0)
HEMOGLOBIN: 11.1 g/dL — AB (ref 13.0–17.0)
MCH: 31.6 pg (ref 26.0–34.0)
MCHC: 31.9 g/dL (ref 30.0–36.0)
MCV: 99.1 fL (ref 78.0–100.0)
Platelets: 211 10*3/uL (ref 150–400)
RBC: 3.51 MIL/uL — AB (ref 4.22–5.81)
RDW: 13 % (ref 11.5–15.5)
WBC: 4.9 10*3/uL (ref 4.0–10.5)

## 2017-12-04 LAB — BASIC METABOLIC PANEL
ANION GAP: 3 — AB (ref 5–15)
BUN: 27 mg/dL — ABNORMAL HIGH (ref 6–20)
CHLORIDE: 112 mmol/L — AB (ref 101–111)
CO2: 25 mmol/L (ref 22–32)
Calcium: 8.8 mg/dL — ABNORMAL LOW (ref 8.9–10.3)
Creatinine, Ser: 1.38 mg/dL — ABNORMAL HIGH (ref 0.61–1.24)
GFR calc Af Amer: 52 mL/min — ABNORMAL LOW (ref >60)
GFR calc non Af Amer: 45 mL/min — ABNORMAL LOW (ref >60)
Glucose, Bld: 112 mg/dL — ABNORMAL HIGH (ref 65–99)
POTASSIUM: 4.3 mmol/L (ref 3.5–5.1)
Sodium: 140 mmol/L (ref 135–145)

## 2017-12-04 SURGERY — ARTHROPLASTY, HIP, TOTAL, ANTERIOR APPROACH
Anesthesia: Spinal | Site: Hip | Laterality: Left

## 2017-12-04 MED ORDER — PROPOFOL 500 MG/50ML IV EMUL
INTRAVENOUS | Status: DC | PRN
  Administered 2017-12-04: 30 ug/kg/min via INTRAVENOUS

## 2017-12-04 MED ORDER — PROPOFOL 10 MG/ML IV BOLUS
INTRAVENOUS | Status: DC | PRN
  Administered 2017-12-04 (×3): 20 mg via INTRAVENOUS

## 2017-12-04 MED ORDER — WARFARIN - PHARMACIST DOSING INPATIENT
Freq: Every day | Status: DC
  Administered 2017-12-05: 17:00:00

## 2017-12-04 MED ORDER — CEFAZOLIN SODIUM-DEXTROSE 2-4 GM/100ML-% IV SOLN
INTRAVENOUS | Status: AC
  Filled 2017-12-04: qty 100

## 2017-12-04 MED ORDER — WARFARIN SODIUM 5 MG PO TABS
5.0000 mg | ORAL_TABLET | Freq: Once | ORAL | Status: AC
  Administered 2017-12-05: 5 mg via ORAL
  Filled 2017-12-04 (×2): qty 1

## 2017-12-04 MED ORDER — FENTANYL CITRATE (PF) 250 MCG/5ML IJ SOLN
INTRAMUSCULAR | Status: DC | PRN
  Administered 2017-12-04 (×3): 25 ug via INTRAVENOUS

## 2017-12-04 MED ORDER — KETOROLAC TROMETHAMINE 30 MG/ML IJ SOLN
INTRAMUSCULAR | Status: DC | PRN
  Administered 2017-12-04: 30 mg via INTRAMUSCULAR

## 2017-12-04 MED ORDER — BUPIVACAINE-EPINEPHRINE (PF) 0.5% -1:200000 IJ SOLN
INTRAMUSCULAR | Status: AC
  Filled 2017-12-04: qty 30

## 2017-12-04 MED ORDER — CEFAZOLIN SODIUM-DEXTROSE 2-4 GM/100ML-% IV SOLN
2.0000 g | Freq: Once | INTRAVENOUS | Status: AC
  Administered 2017-12-04: 2 g via INTRAVENOUS

## 2017-12-04 MED ORDER — LACTATED RINGERS IV SOLN
INTRAVENOUS | Status: DC
Start: 2017-12-04 — End: 2017-12-09
  Administered 2017-12-04 (×2): via INTRAVENOUS

## 2017-12-04 MED ORDER — FENTANYL CITRATE (PF) 250 MCG/5ML IJ SOLN
INTRAMUSCULAR | Status: AC
  Filled 2017-12-04: qty 5

## 2017-12-04 MED ORDER — SODIUM CHLORIDE 0.9 % IJ SOLN
INTRAMUSCULAR | Status: DC | PRN
  Administered 2017-12-04: 30 mL via INTRAVENOUS

## 2017-12-04 MED ORDER — BUPIVACAINE HCL (PF) 0.5 % IJ SOLN
INTRAMUSCULAR | Status: DC | PRN
  Administered 2017-12-04: 2.4 mL via INTRATHECAL

## 2017-12-04 MED ORDER — HEPARIN (PORCINE) IN NACL 100-0.45 UNIT/ML-% IJ SOLN
1400.0000 [IU]/h | INTRAMUSCULAR | Status: AC
  Filled 2017-12-04: qty 250

## 2017-12-04 MED ORDER — SUGAMMADEX SODIUM 500 MG/5ML IV SOLN
INTRAVENOUS | Status: AC
Start: 2017-12-04 — End: ?
  Filled 2017-12-04: qty 5

## 2017-12-04 MED ORDER — CEFAZOLIN SODIUM 1 G IJ SOLR
INTRAMUSCULAR | Status: AC
Start: 2017-12-04 — End: ?
  Filled 2017-12-04: qty 30

## 2017-12-04 MED ORDER — PROPOFOL 10 MG/ML IV BOLUS
INTRAVENOUS | Status: AC
Start: 2017-12-04 — End: ?
  Filled 2017-12-04: qty 20

## 2017-12-04 MED ORDER — BUPIVACAINE-EPINEPHRINE (PF) 0.5% -1:200000 IJ SOLN
INTRAMUSCULAR | Status: DC | PRN
  Administered 2017-12-04: 30 mL via PERINEURAL

## 2017-12-04 MED ORDER — 0.9 % SODIUM CHLORIDE (POUR BTL) OPTIME
TOPICAL | Status: DC | PRN
  Administered 2017-12-04: 1000 mL

## 2017-12-04 MED ORDER — KETOROLAC TROMETHAMINE 30 MG/ML IJ SOLN
INTRAMUSCULAR | Status: AC
  Filled 2017-12-04: qty 1

## 2017-12-04 SURGICAL SUPPLY — 63 items
ADH SKN CLS APL DERMABOND .7 (GAUZE/BANDAGES/DRESSINGS) ×2
ALCOHOL ISOPROPYL (RUBBING) (MISCELLANEOUS) ×3 IMPLANT
BLADE CLIPPER SURG (BLADE) ×2 IMPLANT
CABLE CERLAGE W/CRIMP 1.8 (Cable) ×1 IMPLANT
CABLE CERLAGE W/CRIMP 1.8MM (Cable) ×1 IMPLANT
CABLE GTR COCR 1.8MMX635MM (Orthopedic Implant) IMPLANT
CABLE GTR COCR 1.8X635 (Orthopedic Implant) IMPLANT
CAPT HIP TOTAL 2 ×2 IMPLANT
CHLORAPREP W/TINT 26ML (MISCELLANEOUS) ×5 IMPLANT
COVER SURGICAL LIGHT HANDLE (MISCELLANEOUS) ×5 IMPLANT
DERMABOND ADVANCED (GAUZE/BANDAGES/DRESSINGS) ×4
DERMABOND ADVANCED .7 DNX12 (GAUZE/BANDAGES/DRESSINGS) ×2 IMPLANT
DRAPE C-ARM 42X72 X-RAY (DRAPES) ×3 IMPLANT
DRAPE STERI IOBAN 125X83 (DRAPES) ×3 IMPLANT
DRAPE U-SHAPE 47X51 STRL (DRAPES) ×9 IMPLANT
DRESSING PREVENA PLUS CUSTOM (GAUZE/BANDAGES/DRESSINGS) IMPLANT
DRSG AQUACEL AG ADV 3.5X10 (GAUZE/BANDAGES/DRESSINGS) ×3 IMPLANT
DRSG PREVENA PLUS CUSTOM (GAUZE/BANDAGES/DRESSINGS) ×3
ELECT BLADE 4.0 EZ CLEAN MEGAD (MISCELLANEOUS) ×6
ELECT PENCIL ROCKER SW 15FT (MISCELLANEOUS) ×5 IMPLANT
ELECT REM PT RETURN 9FT ADLT (ELECTROSURGICAL) ×3
ELECTRODE BLDE 4.0 EZ CLN MEGD (MISCELLANEOUS) ×1 IMPLANT
ELECTRODE REM PT RTRN 9FT ADLT (ELECTROSURGICAL) ×1 IMPLANT
GLOVE BIO SURGEON STRL SZ8.5 (GLOVE) ×16 IMPLANT
GLOVE BIOGEL PI IND STRL 8.5 (GLOVE) ×1 IMPLANT
GLOVE BIOGEL PI INDICATOR 8.5 (GLOVE) ×2
GOWN STRL REUS W/ TWL LRG LVL3 (GOWN DISPOSABLE) ×2 IMPLANT
GOWN STRL REUS W/TWL 2XL LVL3 (GOWN DISPOSABLE) ×3 IMPLANT
GOWN STRL REUS W/TWL LRG LVL3 (GOWN DISPOSABLE) ×6
HANDPIECE INTERPULSE COAX TIP (DISPOSABLE) ×3
HOOD PEEL AWAY FACE SHEILD DIS (HOOD) ×6 IMPLANT
KIT BASIN OR (CUSTOM PROCEDURE TRAY) ×3 IMPLANT
KIT DRSG PREVENA PLUS 7DAY 125 (MISCELLANEOUS) ×2 IMPLANT
KIT TURNOVER KIT B (KITS) ×3 IMPLANT
MANIFOLD NEPTUNE II (INSTRUMENTS) ×3 IMPLANT
MARKER SKIN DUAL TIP RULER LAB (MISCELLANEOUS) ×6 IMPLANT
NDL SPNL 18GX3.5 QUINCKE PK (NEEDLE) ×1 IMPLANT
NEEDLE SPNL 18GX3.5 QUINCKE PK (NEEDLE) ×3 IMPLANT
NS IRRIG 1000ML POUR BTL (IV SOLUTION) ×3 IMPLANT
PACK TOTAL JOINT (CUSTOM PROCEDURE TRAY) ×3 IMPLANT
PACK UNIVERSAL I (CUSTOM PROCEDURE TRAY) ×3 IMPLANT
PAD ARMBOARD 7.5X6 YLW CONV (MISCELLANEOUS) ×8 IMPLANT
SAW OSC TIP CART 19.5X105X1.3 (SAW) ×3 IMPLANT
SEALER BIPOLAR AQUA 6.0 (INSTRUMENTS) IMPLANT
SET HNDPC FAN SPRY TIP SCT (DISPOSABLE) ×1 IMPLANT
SOL PREP POV-IOD 4OZ 10% (MISCELLANEOUS) ×3 IMPLANT
SUT ETHIBOND NAB CT1 #1 30IN (SUTURE) ×6 IMPLANT
SUT ETHILON 2 0 FS 18 (SUTURE) ×2 IMPLANT
SUT MNCRL AB 3-0 PS2 18 (SUTURE) ×3 IMPLANT
SUT MON AB 2-0 CT1 36 (SUTURE) ×3 IMPLANT
SUT VIC AB 0 CT1 27 (SUTURE) ×3
SUT VIC AB 0 CT1 27XBRD ANBCTR (SUTURE) IMPLANT
SUT VIC AB 1 CT1 27 (SUTURE) ×3
SUT VIC AB 1 CT1 27XBRD ANBCTR (SUTURE) ×1 IMPLANT
SUT VIC AB 2-0 CT1 27 (SUTURE) ×3
SUT VIC AB 2-0 CT1 TAPERPNT 27 (SUTURE) ×1 IMPLANT
SUT VLOC 180 0 24IN GS25 (SUTURE) ×3 IMPLANT
SYR 50ML LL SCALE MARK (SYRINGE) ×3 IMPLANT
TOWEL OR 17X24 6PK STRL BLUE (TOWEL DISPOSABLE) ×3 IMPLANT
TOWEL OR 17X26 10 PK STRL BLUE (TOWEL DISPOSABLE) ×3 IMPLANT
TRAY FOLEY CATH SILVER 16FR (SET/KITS/TRAYS/PACK) IMPLANT
WATER STERILE IRR 1000ML POUR (IV SOLUTION) ×5 IMPLANT
WND VAC CANISTER 500ML (MISCELLANEOUS) ×2 IMPLANT

## 2017-12-04 NOTE — Progress Notes (Signed)
PROGRESS NOTE   [name]  DXI:[id number]    DOB: 03/26/1930    DOA: 11/29/2017  PCP: [name], MD   I have briefly reviewed patients previous medical records in [facility].  Brief Narrative:  84 year old married male, physically active and independent, PMH of complete heart block status post permanent pacemaker, last generator changed 2013 (Dr. Rayann Heman, EP cardiology), dementia, GERD, HTN, OSA on CPAP, permanent A. fib on Coumadin, transitional cell carcinoma status post right nephrectomy, stage III chronic kidney disease, stroke/TIA, frequent falls (3 this year) missed a step and fell on a concrete surface 5 days PTA leading to left hip pain and fracture, seen at orthopedic office and sent to the hospital for admission.  Orthopedics consulted and plan surgical repair pending reversal of Coumadin anticoagulation.  INR now down to 1.3.  Orthopedics has postponed surgery until 6/3.   Assessment & Plan:   Principal Problem:   Closed left hip fracture (HCC) Active Problems:   Obstructive sleep apnea   FIBRILLATION, ATRIAL   Long term ([place]) use of anticoagulants   Pacemaker-Medtronic   Lower leg edema   Fall   Essential hypertension   Stroke (cerebrum) (HCC)   BPH (benign prostatic hyperplasia)   Dementia   CKD (chronic kidney disease), stage III (HCC)   Closed right hip fracture, initial encounter ([place])   Diarrhea   Left hip fracture: Sustained after mechanical fall at home.  Orthopedics was consulted but could not operate right away due to anticoagulation from Coumadin.  Anticoagulation was reversed with vitamin K but orthopedics unfortunately were unable to reschedule surgery until 6/3 evening.  Based on available data, patient is at moderate risk for perioperative CV events and may proceed with surgery without any further cardiac work-up with close perioperative monitoring.  Pain controlled.  I discussed with patient's primary EP Cardiology team this morning, they  will interrogate patient's PPM prior to surgery but have no further recommendations and okay to continue heparin/Lovenox bridging and Coumadin postop.  I discussed with Dr. Lyla Glassing.  Mechanical fall: As per spouse, patient has sustained 3 falls this year.  During the first fall, he had sustained extensive trauma/hematoma to right upper extremity but did not require surgery.  PT and OT evaluation after surgery.  Patient's daughter is interested in inpatient rehab postoperatively.  Discussed with rehab coordinator and will place consult request postoperatively.  Permanent atrial fibrillation: CHA2DS2-VASc Score is 5.  Patient on Coumadin at home.  Presented with INR of 2.85.  Continue metoprolol.  Patient required vitamin K 10 mg, 2.5 mg, 5 mg and INR down to 1.6> 1.3 >1.27.  Resume Coumadin postop when okay with orthopedics.  Discussed with Cardiology team 6/1, started IV heparin per pharmacy due to embolic risk, which can be stopped prior to surgery.  Discussed with patient's primary EP cardiology team this morning and recommended heparin/Lovenox bridging and Coumadin postop.  Status post PPM for complete heart block: As per spouse, initial pacemaker was placed approximately 31 years ago and has had a changed about 3 or 4 times.  Stable.  TTE 5/30 shows LVEF 60-65%.  Discussed with patient's primary EP cardiology team this morning and they will interrogate patient's PPM prior to surgery.  Dementia: Mild.  Mental status at baseline.  Essential hypertension: Controlled.  OSA on CPAP: Stable.  Status post right nephrectomy for TCC/stage III chronic kidney disease: Baseline creatinine prior to admission was 1.8.  Follows with urology annually/Dr. Alinda Money and gets cystoscopy.  Creatinine was  in the 1.4-1.5 range but today increased to 1.64.  Clinically not significantly volume overloaded.  Held Lasix 6/2.  Creatinine has improved to 1.38.  Continue to follow daily BMP postoperatively.  History of  TIA/stroke: Coumadin on hold for surgery and to be resumed post surgery.  Continue aspirin.  Now on IV heparin- held at 11 AM as per orthopedics recommendation in preparation for surgery this evening.  Macrocytic anemia: Unclear etiology.  Stable.  Lower extremity edema: Unclear etiology.  More on left than right which may be related to his [place] left hip fracture.  Does not carry CHF diagnosis.  Remains on Lasix, continue.  TTE 01/04/2016 showed LVEF 50-55%.  Therapeutically anticoagulated PTA and thereby low index of suspicion for DVT.  Lower extremity venous Dopplers negative for DVT.  Now on IV heparin.  Diarrhea: Patient reports chronic intermittent loose stools for for which he takes over-the-counter diarrhea medicines.  Low index of suspicion for C. difficile.  PRN Imodium.  Has not been an issue for the last couple of days.   DVT prophylaxis: SCDs.  Currently on IV heparin-discussed with RN and pharmacy to hold at 28 AM in preparation for surgery. Code Status: Full Family Communication: Discussed in detail with patient's son-in-law at bedside.  Updated care and answered questions. Disposition: To be determined.  Patient/family interested in CIR, will place consult postop.   Consultants:  Orthopedics  Procedures:  None  Antimicrobials:  None   Subjective: Denies complaints.  Waiting for surgery this evening.  Not much pain reported.  Has been n.p.o. since last night.  ROS: As above.  Objective:  Vitals:   12/03/17 1954 12/04/17 0447 12/04/17 0522 12/04/17 0636  BP: (!) 148/80  (!) 149/82   Pulse: 72  71   Resp:  18    Temp: 97.9 F (36.6 C)  98.2 F (36.8 C)   TempSrc: Oral  Oral   SpO2: 98%  97% 98%  Weight:      Height:        Examination:   General exam: Pleasant elderly male, moderately built and nourished sitting up in bed without distress. Respiratory system: Clear to auscultation. Respiratory effort normal.  Stable without change. Cardiovascular system:  S1 & S2 heard, RRR. No JVD, murmurs, rubs, gallops or clicks.  Trace bilateral ankle edema left > right.  Telemetry personally reviewed: V paced rhythm.?  Underlying A. fib.  Stable. Gastrointestinal system: Abdomen is nondistended, soft and nontender. No organomegaly or masses felt. Normal bowel sounds heard.  Stable Central nervous system: Alert and oriented x2. No focal neurological deficits.  Stable Extremities: Symmetric 5 x 5 power except left lower extremity where movements are restricted by left hip pain from fracture.  Stable Skin: No rashes, lesions or ulcers Psychiatry: Judgement and insight appear normal. Mood & affect appropriate.     Data Reviewed: I have personally reviewed following labs and imaging studies  CBC: Recent Labs  Lab 11/29/17 2003 11/30/17 0334 12/01/17 0402 12/02/17 1021 12/03/17 0334 12/04/17 0435  WBC 4.8 4.7 4.8 4.1 4.8 4.9  NEUTROABS 2.7  --   --   --   --   --   HGB 11.5* 10.6* 10.9* 11.9* 11.1* 11.1*  HCT 37.2* 34.1* 34.3* 37.7* 34.7* 34.8*  MCV 101.9* 100.9* 99.7 100.0 99.7 99.1  PLT 177 174 181 190 203 350   Basic Metabolic Panel: Recent Labs  Lab 11/30/17 0334 12/01/17 0402 12/02/17 1021 12/03/17 0334 12/04/17 0435  NA 143 141 140 142  140  K 4.4 4.1 4.4 4.0 4.3  CL 109 108 106 108 112*  CO2 27 26 28 28 25   GLUCOSE 129* 97 109* 127* 112*  BUN 29* 31* 26* 32* 27*  CREATININE 1.45* 1.56* 1.54* 1.64* 1.38*  CALCIUM 9.1 8.8* 9.2 9.1 8.8*   Liver Function Tests: Recent Labs  Lab 11/29/17 2003  AST 21  ALT 16*  ALKPHOS 102  BILITOT 0.9  PROT 6.8  ALBUMIN 3.6   Coagulation Profile: Recent Labs  Lab 11/30/17 1310 12/01/17 0402 12/01/17 1234 12/02/17 1021 12/03/17 0334  INR 2.83 2.20 1.66 1.31 1.27   CBG: No results for input(s): GLUCAP in the last 168 hours.  Recent Results (from the past 240 hour(s))  Surgical pcr screen     Status: None   Collection Time: 11/30/17  2:48 AM  Result Value Ref Range Status   MRSA,  PCR NEGATIVE NEGATIVE Final   Staphylococcus aureus NEGATIVE NEGATIVE Final    Comment: (NOTE) The Xpert SA Assay (FDA approved for NASAL specimens in patients 74 years of age and older), is one component of a comprehensive surveillance program. It is not intended to diagnose infection nor to guide or monitor treatment. Performed at [facility], [place] [address], [place], [place] [address]          Radiology Studies: No results found.      Scheduled Meds: . aspirin  81 mg Oral BH-q7a  . clonazePAM  2 mg Oral QHS  . cycloSPORINE  1 drop Both Eyes BID  . doxazosin  2 mg Oral Daily  . finasteride  5 mg Oral Daily  . furosemide  40 mg Oral Daily  . galantamine  8 mg Oral BID  . hydrocortisone cream   Topical BID  . ipratropium  2 spray Each Nare BID  . metoprolol tartrate  25 mg Oral BID  . multivitamin with minerals  1 tablet Oral Daily   Continuous Infusions:    LOS: 5 days     Vernell Leep, MD, FACP, [facility]. [facility] Pager [phone]  If 7PM-7AM, please contact night-coverage [email or web address] Password [email or web address] 12/04/2017, 1:43 PM

## 2017-12-04 NOTE — Interval H&P Note (Signed)
History and Physical Interval Note:  12/04/2017 7:48 PM  [name]  has presented today for surgery, with the diagnosis of hip fracture  The various methods of treatment have been discussed with the patient and family. After consideration of risks, benefits and other options for treatment, the patient has consented to  Procedure(s): TOTAL HIP ARTHROPLASTY ANTERIOR APPROACH (Left) as a surgical intervention .  The patient's history has been reviewed, patient examined, no change in status, stable for surgery.  I have reviewed the patient's chart and labs.  Questions were answered to the patient's satisfaction.     Hilton Cork Talley Kreiser

## 2017-12-04 NOTE — Anesthesia Procedure Notes (Signed)
Spinal  Patient location during procedure: OR Start time: 12/04/2017 8:35 PM End time: 12/04/2017 8:40 PM Staffing Anesthesiologist: Audry Pili, MD Performed: anesthesiologist  Preanesthetic Checklist Completed: patient identified, surgical consent, pre-op evaluation, timeout performed, IV checked, risks and benefits discussed and monitors and equipment checked Spinal Block Patient position: left lateral decubitus Prep: DuraPrep Patient monitoring: heart rate, cardiac monitor, continuous pulse ox and blood pressure Approach: midline Location: L2-3 Injection technique: single-shot Needle Needle type: Quincke  Needle gauge: 22 G Additional Notes Functioning IV was confirmed and monitors were applied. Sterile prep and drape, including hand hygiene, mask, and sterile gloves were used. The patient was positioned and the spine was prepped. The skin was anesthetized with lidocaine. Free flow of clear CSF was obtained prior to injecting local anesthetic into the CSF. The spinal needle aspirated freely following injection. The needle was carefully withdrawn. The patient tolerated the procedure well. Consent was obtained prior to the procedure with all questions answered and concerns addressed. Risks including, but not limited to, bleeding, infection, nerve damage, paralysis, failed block, inadequate analgesia, allergic reaction, high spinal, itching, and headache were discussed and the patient wished to proceed.  Michael Don, MD

## 2017-12-04 NOTE — Op Note (Signed)
OPERATIVE REPORT  SURGEON: Rod Can, MD   ASSISTANT: Ky Barban, RNFA.  PREOPERATIVE DIAGNOSIS: Displaced Left femoral neck fracture.     POSTOPERATIVE DIAGNOSIS: Displaced Left femoral neck fracture.     PROCEDURE: Left total hip arthroplasty, anterior approach.   IMPLANTS: DePuy AML stem, size 16.5 x 160 mm, large stature. DePuy Pinnacle Cup, size 58 mm. DePuy Altrx liner, size 36 by 58 mm, +4 neutral. DePuy metal head ball, size 36 + 8.5 mm.  ANESTHESIA:  Spinal  ESTIMATED BLOOD LOSS:-600 mL    ANTIBIOTICS: 2 g Ancef.  DRAINS: None.  COMPLICATIONS: None.   CONDITION: PACU - hemodynamically stable.   BRIEF CLINICAL NOTE: [name] is a 84 y.o. male with a displaced femoral neck fracture.  He was admitted to the hospitalist service.  Patient takes chronic Coumadin.  Once his INR normalized, he was indicated for total hip arthroplasty. The risks, benefits, and alternatives to the procedure were explained, and the patient elected to proceed.  PROCEDURE IN DETAIL: Surgical site was marked by myself in the pre-op holding area. Once inside the operating room, spinal anesthesia was obtained, and a foley catheter was inserted. The patient was then positioned on the Hana table. All bony prominences were well padded. The hip was prepped and draped in the normal sterile surgical fashion. A time-out was called verifying side and site of surgery. The patient received IV antibiotics within 60 minutes of beginning the procedure.  The direct anterior approach to the hip was performed through the Hueter interval. Lateral femoral circumflex vessels were treated with the Auqumantys. The anterior capsule was exposed and an inverted T capsulotomy was made. The fracture hematoma was encountered and evacuated.  Patient had a complex femoral neck fracture.  The head was valgus impacted and and retroversion.  Additionally, in the coronal plane he had 2 parallel fractures that  extended to the capsular attachment just proximal to the intertrochanteric ridge.  The femoral neck cut was made to the level of the templated cut. A corkscrew was placed into the head and the head was removed. The head was passed to the back table and was measured.  I placed a single subperiosteal cerclage wire just proximal to the lesser trochanter in order to stabilize the above-mentioned fracture.  Acetabular exposure was achieved, and the pulvinar and labrum were excised. Sequential reaming of the acetabulum was then performed up to a size 57 mm reamer. A 58 mm cup was then opened and impacted into place at approximately 40 degrees of abduction and 20 degrees of anteversion. The final polyethylene liner was impacted into place and acetabular osteophytes were removed.   I then gained femoral exposure taking care to protect the abductors and greater trochanter. This was performed using standard external rotation, extension, and adduction. The capsule was peeled off the inner aspect of the greater trochanter, taking care to preserve the short external rotators. A cookie cutter was used to enter the femoral canal, and then the femoral canal finder was placed. Due to the proximal femur fracture, I elected to proceed with AML implant.  I sequentially reamed up to a 16 mm reamer.  I broached up to a 16 mm large stature broach. I placed a trial neck and a trial head ball. The hip was reduced. Leg lengths and offset were checked fluoroscopically. The hip was dislocated and trial components were removed. The final implants were placed, and the hip was reduced.  The cable was crimped and clipped.  Fluoroscopy was  used to confirm component position and leg lengths. At 90 degrees of external rotation and full extension, the hip was stable to an anterior directed force.  The wound was copiously irrigated with normal saline using pulse lavage. Marcaine solution was injected into the periarticular soft  tissue. The wound was closed in layers using #1 Vicryl and V-Loc for the fascia, 2-0 Vicryl for the subcutaneous fat, 2-0 Monocryl for the deep dermal layer, 2-0 nylon vertical mattress suture for the skin.  Prevena incisional wound VAC was applied according to manufacturer's instructions with good seal.  The patient was transported to the recovery room in stable condition. Sponge, needle, and instrument counts were correct at the end of the case x2. The patient tolerated the procedure well and there were no known complications.  Postoperatively, the patient will be readmitted to the hospitalist service.  He may weight-bear as tolerated with a walker.  Begin Coumadin.  Lovenox bridge to begin tomorrow.  He will work with physical and occupational therapy.  He will undergo disposition planning.  Patient will be discharged with a portable Prevena VAC unit depending on the length of his hospital stay.  I will need to see him in the office 1 week after discharge for VAC removal.

## 2017-12-04 NOTE — Anesthesia Preprocedure Evaluation (Addendum)
Anesthesia Evaluation  Patient identified by MRN, date of birth, ID band Patient awake    Reviewed: Allergy & Precautions, NPO status , Patient's Chart, lab work & pertinent test results, reviewed documented beta blocker date and time   Airway Mallampati: II  TM Distance: >3 FB Neck ROM: Full    Dental  (+) Teeth Intact   Pulmonary sleep apnea and Continuous Positive Airway Pressure Ventilation , pneumonia, resolved,    breath sounds clear to auscultation       Cardiovascular hypertension, Pt. on medications and Pt. on home beta blockers + dysrhythmias Atrial Fibrillation + pacemaker + Valvular Problems/Murmurs AI  Rhythm:Regular Rate:Normal + Systolic murmurs '19 TTE - Mild concentric LVH. EF 60% to 65%. Mild AS with moderate AI. Mild MR. Severely dilated LA and RA. Moderate TR. PASP moderately-severely increased; 58 mmHg. A small pericardial effusion was identified.   Neuro/Psych Dementia RLS TIA   GI/Hepatic Neg liver ROS, GERD  Controlled,  Endo/Other  negative endocrine ROS  Renal/GU CRFRenal diseaseS/p right nephrectomy for transitional cell carcinoma     Musculoskeletal  (+) Arthritis ,   Abdominal   Peds  Hematology negative hematology ROS (+)   Anesthesia Other Findings   Reproductive/Obstetrics                           Lab Results  Component Value Date   WBC 4.9 12/04/2017   HGB 11.1 (L) 12/04/2017   HCT 34.8 (L) 12/04/2017   MCV 99.1 12/04/2017   PLT 211 12/04/2017   Lab Results  Component Value Date   CREATININE 1.38 (H) 12/04/2017   BUN 27 (H) 12/04/2017   NA 140 12/04/2017   K 4.3 12/04/2017   CL 112 (H) 12/04/2017   CO2 25 12/04/2017    Anesthesia Physical  Anesthesia Plan  ASA: III  Anesthesia Plan: Spinal   Post-op Pain Management:    Induction: Intravenous  PONV Risk Score and Plan: 3 and Treatment may vary due to age or medical condition, Ondansetron  and Propofol infusion  Airway Management Planned: Natural Airway and Simple Face Mask  Additional Equipment: None  Intra-op Plan:   Post-operative Plan:   Informed Consent: I have reviewed the patients History and Physical, chart, labs and discussed the procedure including the risks, benefits and alternatives for the proposed anesthesia with the patient or authorized representative who has indicated his/her understanding and acceptance.     Plan Discussed with: CRNA and Anesthesiologist  Anesthesia Plan Comments:        Anesthesia Quick Evaluation

## 2017-12-04 NOTE — Care Management Important Message (Signed)
Important Message  Patient Details  Name: [name] MRN: [id number] Date of Birth: September 14, 1929   Medicare Important Message Given:  Yes    [name] 12/04/2017, 3:35 PM

## 2017-12-04 NOTE — Consult Note (Signed)
Spinal performed on 12/04/17 at 20:40. Plan reportedly to restart warfarin tomorrow and bridge with lovenox. Lovenox should not be given until 08:40 (12 hours after spinal) on 12/05/17.

## 2017-12-04 NOTE — Progress Notes (Signed)
ANTICOAGULATION CONSULT NOTE  Pharmacy Consult for coumadin Indication: atrial fibrillation  No Known Allergies  Patient Measurements: Height: 5\' 10"  (177.8 cm) Weight: 180 lb (81.6 kg) IBW/kg (Calculated) : 73 Heparin Dosing Weight: 81.6kg  Vital Signs: Temp: 98.2 F (36.8 C) (06/03 1540) Temp Source: Oral (06/03 1540) BP: 159/91 (06/03 1540) Pulse Rate: 70 (06/03 1540)  Labs: Recent Labs    12/02/17 1021  12/03/17 0334 12/03/17 1334 12/03/17 2241 12/04/17 0435  HGB 11.9*  --  11.1*  --   --  11.1*  HCT 37.7*  --  34.7*  --   --  34.8*  PLT 190  --  203  --   --  211  LABPROT 16.2*  --  15.7*  --   --   --   INR 1.31  --  1.27  --   --   --   HEPARINUNFRC  --    < > 0.31 0.19* 0.35  --   CREATININE 1.54*  --  1.64*  --   --  1.38*   < > = values in this interval not displayed.    Estimated Creatinine Clearance: 39.7 mL/min (A) (by C-G formula based on SCr of 1.38 mg/dL (H)).  Assessment: 80 YOM on warfarin PTA for AFib, s/p fall with L hip fracture- to have surgery on Monday, bridging with heparin until OR. Received vitamin K on 5/29, 5/30 and 5/31. INR down to 1.27. Hgb 11., plts 203- stable, no overt bleeding noted. Plan to restart coumadin post   Goal of Therapy:  INR 2-3 Monitor platelets by anticoagulation protocol: Yes   Plan:  Coumadin 5mg  po tonight Daily PT/INR  Thanks for allowing pharmacy to be a part of this patient's care.  Excell Seltzer, PharmD Clinical Pharmacist

## 2017-12-04 NOTE — Progress Notes (Addendum)
PPM function reviewed and normal.  Follow up appt scheduled with Dr Rayann Heman in a few weeks.  Agree with plans for bridging back to therapeutic INR. Pt looking forward to hip surgery this evening. No need for device reprogramming.   Chanetta Marshall, NP 12/04/2017 3:45 PM   EP Attending  Agree with above. Bridging is reasonable. Would restart warfarin when post op day zero or one.  Mikle Bosworth.D.

## 2017-12-04 NOTE — Transfer of Care (Signed)
Immediate Anesthesia Transfer of Care Note  Patient: [name]  Procedure(s) Performed: TOTAL HIP ARTHROPLASTY ANTERIOR APPROACH WITH CABLE (Left Hip)  Patient Location: PACU  Anesthesia Type:MAC combined with regional for post-op pain  Level of Consciousness: awake, alert  and oriented  Airway & Oxygen Therapy: Patient Spontanous Breathing  Post-op Assessment: Report given to RN and Post -op Vital signs reviewed and stable  Post vital signs: Reviewed and stable  Last Vitals:  Vitals Value Taken Time  BP 141/77 12/04/2017 11:29 PM  Temp    Pulse 70 12/04/2017 11:33 PM  Resp 15 12/04/2017 11:33 PM  SpO2 98 % 12/04/2017 11:33 PM  Vitals shown include unvalidated device data.  Last Pain:  Vitals:   12/04/17 2330  TempSrc:   PainSc: (P) 0-No pain      Patients Stated Pain Goal: 2 (16/10/96 0454)  Complications: No apparent anesthesia complications

## 2017-12-05 ENCOUNTER — Encounter (HOSPITAL_COMMUNITY): Payer: Self-pay | Admitting: *Deleted

## 2017-12-05 DIAGNOSIS — Z96642 Presence of left artificial hip joint: Secondary | ICD-10-CM

## 2017-12-05 DIAGNOSIS — I959 Hypotension, unspecified: Secondary | ICD-10-CM

## 2017-12-05 DIAGNOSIS — Z950 Presence of cardiac pacemaker: Secondary | ICD-10-CM

## 2017-12-05 DIAGNOSIS — F0390 Unspecified dementia without behavioral disturbance: Secondary | ICD-10-CM

## 2017-12-05 DIAGNOSIS — N183 Chronic kidney disease, stage 3 (moderate): Secondary | ICD-10-CM

## 2017-12-05 DIAGNOSIS — I10 Essential (primary) hypertension: Secondary | ICD-10-CM

## 2017-12-05 DIAGNOSIS — S72002A Fracture of unspecified part of neck of left femur, initial encounter for closed fracture: Secondary | ICD-10-CM | POA: Diagnosis present

## 2017-12-05 DIAGNOSIS — Z8673 Personal history of transient ischemic attack (TIA), and cerebral infarction without residual deficits: Secondary | ICD-10-CM

## 2017-12-05 DIAGNOSIS — D62 Acute posthemorrhagic anemia: Secondary | ICD-10-CM

## 2017-12-05 DIAGNOSIS — I482 Chronic atrial fibrillation: Secondary | ICD-10-CM

## 2017-12-05 DIAGNOSIS — Z7901 Long term (current) use of anticoagulants: Secondary | ICD-10-CM

## 2017-12-05 DIAGNOSIS — G8918 Other acute postprocedural pain: Secondary | ICD-10-CM

## 2017-12-05 LAB — CBC
HCT: 28.5 % — ABNORMAL LOW (ref 39.0–52.0)
Hemoglobin: 9 g/dL — ABNORMAL LOW (ref 13.0–17.0)
MCH: 31.7 pg (ref 26.0–34.0)
MCHC: 31.6 g/dL (ref 30.0–36.0)
MCV: 100.4 fL — ABNORMAL HIGH (ref 78.0–100.0)
PLATELETS: 211 10*3/uL (ref 150–400)
RBC: 2.84 MIL/uL — ABNORMAL LOW (ref 4.22–5.81)
RDW: 13.1 % (ref 11.5–15.5)
WBC: 9.4 10*3/uL (ref 4.0–10.5)

## 2017-12-05 LAB — BASIC METABOLIC PANEL
ANION GAP: 8 (ref 5–15)
BUN: 40 mg/dL — ABNORMAL HIGH (ref 6–20)
CALCIUM: 9 mg/dL (ref 8.9–10.3)
CO2: 25 mmol/L (ref 22–32)
Chloride: 104 mmol/L (ref 101–111)
Creatinine, Ser: 1.93 mg/dL — ABNORMAL HIGH (ref 0.61–1.24)
GFR, EST AFRICAN AMERICAN: 35 mL/min — AB (ref >60)
GFR, EST NON AFRICAN AMERICAN: 30 mL/min — AB (ref >60)
Glucose, Bld: 172 mg/dL — ABNORMAL HIGH (ref 65–99)
Potassium: 4.8 mmol/L (ref 3.5–5.1)
SODIUM: 137 mmol/L (ref 135–145)

## 2017-12-05 LAB — PROTIME-INR
INR: 1.32
PROTHROMBIN TIME: 16.3 s — AB (ref 11.4–15.2)

## 2017-12-05 MED ORDER — ENOXAPARIN SODIUM 40 MG/0.4ML ~~LOC~~ SOLN
40.0000 mg | Freq: Once | SUBCUTANEOUS | Status: AC
  Administered 2017-12-05: 40 mg via SUBCUTANEOUS

## 2017-12-05 MED ORDER — ENOXAPARIN SODIUM 80 MG/0.8ML ~~LOC~~ SOLN: 80.0000 mg | Freq: Two times a day (BID) | SUBCUTANEOUS | Status: DC

## 2017-12-05 MED ORDER — DOCUSATE SODIUM 100 MG PO CAPS
100.0000 mg | ORAL_CAPSULE | Freq: Two times a day (BID) | ORAL | Status: DC
  Administered 2017-12-05 – 2017-12-09 (×11): 100 mg via ORAL
  Filled 2017-12-05 (×10): qty 1

## 2017-12-05 MED ORDER — SODIUM CHLORIDE 0.9 % IV BOLUS
500.0000 mL | Freq: Once | INTRAVENOUS | Status: AC
  Administered 2017-12-05: 500 mL via INTRAVENOUS

## 2017-12-05 MED ORDER — ENOXAPARIN SODIUM 40 MG/0.4ML ~~LOC~~ SOLN
40.0000 mg | SUBCUTANEOUS | Status: DC
  Administered 2017-12-05: 40 mg via SUBCUTANEOUS
  Filled 2017-12-05: qty 0.4

## 2017-12-05 MED ORDER — METOCLOPRAMIDE HCL 5 MG/ML IJ SOLN: 5.0000 mg | Freq: Three times a day (TID) | INTRAMUSCULAR | Status: DC | PRN

## 2017-12-05 MED ORDER — CEFAZOLIN SODIUM-DEXTROSE 2-4 GM/100ML-% IV SOLN
2.0000 g | Freq: Four times a day (QID) | INTRAVENOUS | Status: AC
  Administered 2017-12-05 (×2): 2 g via INTRAVENOUS
  Filled 2017-12-05 (×2): qty 100

## 2017-12-05 MED ORDER — ENOXAPARIN SODIUM 80 MG/0.8ML ~~LOC~~ SOLN
80.0000 mg | Freq: Every day | SUBCUTANEOUS | Status: DC
  Administered 2017-12-05 – 2017-12-06 (×2): 80 mg via SUBCUTANEOUS
  Filled 2017-12-05 (×2): qty 0.8

## 2017-12-05 MED ORDER — MORPHINE SULFATE (PF) 2 MG/ML IV SOLN
0.5000 mg | INTRAVENOUS | Status: DC | PRN
  Administered 2017-12-05: 1 mg via INTRAVENOUS
  Filled 2017-12-05: qty 1

## 2017-12-05 MED ORDER — MENTHOL 3 MG MT LOZG
1.0000 | LOZENGE | OROMUCOSAL | Status: DC | PRN
Start: 2017-12-05 — End: 2017-12-09

## 2017-12-05 MED ORDER — PHENOL 1.4 % MT LIQD: 1.0000 | OROMUCOSAL | Status: DC | PRN

## 2017-12-05 MED ORDER — HYDROCODONE-ACETAMINOPHEN 5-325 MG PO TABS
1.0000 | ORAL_TABLET | ORAL | Status: DC | PRN
  Administered 2017-12-05 (×2): 2 via ORAL
  Administered 2017-12-06: 1 via ORAL
  Administered 2017-12-06 – 2017-12-07 (×3): 2 via ORAL
  Administered 2017-12-07: 1 via ORAL
  Administered 2017-12-08: 2 via ORAL
  Administered 2017-12-08 – 2017-12-09 (×2): 1 via ORAL
  Filled 2017-12-05 (×8): qty 2
  Filled 2017-12-05: qty 1

## 2017-12-05 MED ORDER — HYDROCODONE-ACETAMINOPHEN 7.5-325 MG PO TABS
1.0000 | ORAL_TABLET | ORAL | Status: DC | PRN
  Administered 2017-12-06: 2 via ORAL
  Filled 2017-12-05: qty 2

## 2017-12-05 MED ORDER — METOCLOPRAMIDE HCL 5 MG PO TABS: 5.0000 mg | ORAL_TABLET | Freq: Three times a day (TID) | ORAL | Status: DC | PRN

## 2017-12-05 MED ORDER — WARFARIN SODIUM 5 MG PO TABS
5.0000 mg | ORAL_TABLET | Freq: Once | ORAL | Status: AC
  Administered 2017-12-05: 5 mg via ORAL
  Filled 2017-12-05: qty 1

## 2017-12-05 NOTE — Progress Notes (Signed)
Inpatient Rehabilitation-Admissions Coordinator   Spoke with pt and family member while pt was sitting up in recliner eating lunch. AC followed up with pt regarding his recent PM&R consult where [facility] discussed the potential need for CIR to allow him to return home safely. Shared booklets, and expectations while in CIR. Pt and family interested in pursing this route if possible. Pt has participated in PT at this time and is awaiting OT evaluation. AC has begun insurance authorization process in case the most appropriate placement is determined to be CIR. Will follow up once I hear back from insurance case manager. Call if questions.  Jhonnie Garner, OTR/L  Rehab Admissions Coordinator  [phone] 12/05/2017 1:53 PM

## 2017-12-05 NOTE — Progress Notes (Signed)
PROGRESS NOTE   [name]  YFV:[id number]    DOB: March 08, 1930    DOA: 11/29/2017  PCP: Michael Orn, MD   I have briefly reviewed patients previous medical records in [facility].  Brief Narrative:  84 year old married male, physically active and independent, PMH of complete heart block status post permanent pacemaker, last generator changed 2013 (Dr. Rayann [name], EP cardiology), dementia, GERD, HTN, OSA on CPAP, permanent A. fib on Coumadin, transitional cell carcinoma status post right nephrectomy, stage III chronic kidney disease, stroke/TIA, frequent falls (3 this year) missed a step and fell on a concrete surface 5 days PTA leading to left hip pain and fracture, seen at orthopedic office and sent to the hospital for admission.  Orthopedics consulted.  Anticoagulation reversed.  Status post left total hip arthroplasty, anterior approach 6/3.  CIR consulted.   Assessment & Plan:   Principal Problem:   Closed left hip fracture (HCC) Active Problems:   Obstructive sleep apnea   FIBRILLATION, ATRIAL   Long term (Maxim) use of anticoagulants   Pacemaker-[employer]   Lower leg edema   Fall   Essential hypertension   Stroke (cerebrum) (HCC)   BPH (benign prostatic hyperplasia)   Dementia   CKD (chronic kidney disease), stage III (HCC)   Closed right hip fracture, initial encounter (Shirley)   Diarrhea   Closed displaced fracture of left femoral neck (HCC)   Status post total replacement of left hip   Benign essential HTN   History of CVA (cerebrovascular accident)   Cardiac pacemaker in situ   Postoperative pain   Acute blood loss anemia   Left hip fracture: Sustained after mechanical fall at home.  Orthopedics was consulted.  Coumadin anticoagulation was reversed with 3 doses of oral vitamin K.  Patient was unable to have surgery until 6/3 due to scheduling issues.  Following preop clearance, patient underwent left total hip arthroplasty, anterior approach on 6/3.  As per  patient and family wishes, CIR consulted and insurance approval pending.  Improving.  Management per orthopedics.  Mechanical fall: As per spouse, patient has sustained 3 falls this year.  During the first fall, he had sustained extensive trauma/hematoma to right upper extremity but did not require surgery.  Therapies evaluation and CIR if possible.  Permanent atrial fibrillation: CHA2DS2-VASc Score is 5.  Patient on Coumadin at home.  Presented with INR of 2.85.  Continue metoprolol.  Patient required vitamin K 10 mg, 2.5 mg, 5 mg and INR down to 1.6> 1.3 >1.27.  As discussed with orthopedics and primary cardiology service, postop resumed full dose Lovenox bridging from 6/4 and Coumadin per pharmacy.  Status post PPM for complete heart block: As per spouse, initial pacemaker was placed approximately 31 years ago and has had a changed about 3 or 4 times.  Stable.  TTE 5/30 shows LVEF 60-65%.  EP Cardiology interrogated PPM prior to surgery and noted without problems.  Dementia: Mild.  Mental status at baseline.  Essential hypertension: Controlled.  OSA on CPAP: Stable.  Status post right nephrectomy for TCC/stage III chronic kidney disease: Baseline creatinine prior to admission was 1.8.  Follows with urology annually/Dr. Alinda [name] and gets cystoscopy.  Creatinine was in the 1.4-1.5 range but today increased to 1.64.  Clinically not significantly volume overloaded.  Held Lasix 6/2.  Creatinine has improved to 1.38 6/3.  Not sure why labs not drawn today, will request now and for tomorrow morning.  It appears that lab orders are being automatically canceled in system.  History of TIA/stroke: Anticoagulation discussion as above.  Macrocytic anemia: Unclear etiology.  Follow CBC now and in a.m.  Lower extremity edema: Unclear etiology.  More on left than right which may be related to his [name] left hip fracture.  Does not carry CHF diagnosis.  Remains on Lasix, continue.  TTE 01/04/2016 showed LVEF  50-55%.  Therapeutically anticoagulated PTA and thereby low index of suspicion for DVT.  Lower extremity venous Dopplers negative for DVT.  Anticoagulation resumed.  Diarrhea: Patient reports chronic intermittent loose stools for for which he takes over-the-counter diarrhea medicines.  Low index of suspicion for C. difficile.  PRN Imodium.  Has not been an issue for the last couple of days.  Hypotension: Noted this afternoon.  IV normal saline bolus ordered.  Check CBCs.  As per nursing, blood pressure post IV fluid bolus improved to 105/70.   DVT prophylaxis: SCDs, full dose Lovenox bridging and Coumadin per pharmacy Code Status: Full Family Communication: None at bedside this morning. Disposition: To be determined.  CIR consulted   Consultants:  Orthopedics  Procedures:  None  Antimicrobials:  None   Subjective: Reports appropriate pain but left hip surgical site.  No chest pain or dyspnea.  Denies any other complaints.  Subsequently in the afternoon, RN reported that patient worked with PT, was sitting on chair and noted to have low blood pressures in the 80s.  ROS: As above.  Objective:  Vitals:   12/05/17 1431 12/05/17 1438 12/05/17 1440 12/05/17 1445  BP: (!) 79/59 (!) 72/48 (!) 83/50 (!) 85/53  Pulse: 70     Resp: 14     Temp: 98.1 F (36.7 C)     TempSrc: Oral     SpO2: 99%     Weight:      Height:        Examination:   General exam: Pleasant elderly male, moderately built and nourished sitting up in bed without distress. Respiratory system: Clear to auscultation. Respiratory effort normal.  Stable. Cardiovascular system: S1 & S2 heard, RRR. No JVD, murmurs, rubs, gallops or clicks.  Trace bilateral ankle edema left > right.  Telemetry personally reviewed: V paced rhythm.  Underlying atrial flutter with variable block. Gastrointestinal system: Abdomen is nondistended, soft and nontender. No organomegaly or masses felt. Normal bowel sounds heard.   Stable Central nervous system: Alert and oriented x2. No focal neurological deficits.  Stable Extremities: Left hip surgical site dressing clean and dry.  Wound VAC in place. Skin: No rashes, lesions or ulcers Psychiatry: Judgement and insight appear normal. Mood & affect appropriate.     Data Reviewed: I have personally reviewed following labs and imaging studies  CBC: Recent Labs  Lab 11/29/17 2003 11/30/17 0334 12/01/17 0402 12/02/17 1021 12/03/17 0334 12/04/17 0435  WBC 4.8 4.7 4.8 4.1 4.8 4.9  NEUTROABS 2.7  --   --   --   --   --   HGB 11.5* 10.6* 10.9* 11.9* 11.1* 11.1*  HCT 37.2* 34.1* 34.3* 37.7* 34.7* 34.8*  MCV 101.9* 100.9* 99.7 100.0 99.7 99.1  PLT 177 174 181 190 203 409   Basic Metabolic Panel: Recent Labs  Lab 11/30/17 0334 12/01/17 0402 12/02/17 1021 12/03/17 0334 12/04/17 0435  NA 143 141 140 142 140  K 4.4 4.1 4.4 4.0 4.3  CL 109 108 106 108 112*  CO2 27 26 28 28 25   GLUCOSE 129* 97 109* 127* 112*  BUN 29* 31* 26* 32* 27*  CREATININE 1.45* 1.56* 1.54* 1.64*  1.38*  CALCIUM 9.1 8.8* 9.2 9.1 8.8*   Liver Function Tests: Recent Labs  Lab 11/29/17 2003  AST 21  ALT 16*  ALKPHOS 102  BILITOT 0.9  PROT 6.8  ALBUMIN 3.6   Coagulation Profile: Recent Labs  Lab 12/01/17 0402 12/01/17 1234 12/02/17 1021 12/03/17 0334 12/05/17 1332  INR 2.20 1.66 1.31 1.27 1.32   CBG: No results for input(s): GLUCAP in the last 168 hours.  Recent Results (from the past 240 hour(s))  Surgical pcr screen     Status: None   Collection Time: 11/30/17  2:48 AM  Result Value Ref Range Status   MRSA, PCR NEGATIVE NEGATIVE Final   Staphylococcus aureus NEGATIVE NEGATIVE Final    Comment: (NOTE) The Xpert SA Assay (FDA approved for NASAL specimens in patients 71 years of age and older), is one component of a comprehensive surveillance program. It is not intended to diagnose infection nor to guide or monitor treatment. Performed at [facility],  [place] [address] [place], [place] [address]          Radiology Studies: Pelvis Portable  Result Date: 12/05/2017 CLINICAL DATA:  Left total hip replacement. EXAM: PORTABLE PELVIS 1-2 VIEWS COMPARISON:  Left hip x-rays dated Nov 29, 2017. FINDINGS: The left hip demonstrates a total arthroplasty without evidence of hardware failure or complication. Single cerclage wire around the greater trochanter. There is expected intra-articular air. There is no fracture or dislocation. The alignment is anatomic. Post-surgical changes noted in the surrounding soft tissues. The right hip is unremarkable. IMPRESSION: 1. Interval left total hip arthroplasty without evidence of acute postoperative complication. Electronically Signed   By: Titus Dubin M.D.   On: 12/05/2017 00:11   Dg C-arm 1-60 Min  Result Date: 12/05/2017 CLINICAL DATA:  Left hip arthroplasty. EXAM: OPERATIVE LEFT HIP (WITH PELVIS IF PERFORMED) 3 VIEWS TECHNIQUE: Fluoroscopic spot image(s) were submitted for interpretation post-operatively. COMPARISON:  Left hip x-rays dated Nov 29, 2017. FINDINGS: Intraoperative x-rays demonstrating interval left total hip arthroplasty with single cerclage wire around the greater trochanter. Components are well aligned. IMPRESSION: Interval left total hip arthroplasty. FLUOROSCOPY TIME:  28 seconds. C-arm fluoroscopic images were obtained intraoperatively and submitted for post operative interpretation. Electronically Signed   By: Titus Dubin M.D.   On: 12/05/2017 00:10   Dg C-arm 1-60 Min  Result Date: 12/05/2017 CLINICAL DATA:  Left hip arthroplasty. EXAM: OPERATIVE LEFT HIP (WITH PELVIS IF PERFORMED) 3 VIEWS TECHNIQUE: Fluoroscopic spot image(s) were submitted for interpretation post-operatively. COMPARISON:  Left hip x-rays dated Nov 29, 2017. FINDINGS: Intraoperative x-rays demonstrating interval left total hip arthroplasty with single cerclage wire around the greater trochanter. Components are well aligned.  IMPRESSION: Interval left total hip arthroplasty. FLUOROSCOPY TIME:  28 seconds. C-arm fluoroscopic images were obtained intraoperatively and submitted for post operative interpretation. Electronically Signed   By: Titus Dubin M.D.   On: 12/05/2017 00:10   Dg Hip Operative Unilat With Pelvis Left  Result Date: 12/05/2017 CLINICAL DATA:  Left hip arthroplasty. EXAM: OPERATIVE LEFT HIP (WITH PELVIS IF PERFORMED) 3 VIEWS TECHNIQUE: Fluoroscopic spot image(s) were submitted for interpretation post-operatively. COMPARISON:  Left hip x-rays dated Nov 29, 2017. FINDINGS: Intraoperative x-rays demonstrating interval left total hip arthroplasty with single cerclage wire around the greater trochanter. Components are well aligned. IMPRESSION: Interval left total hip arthroplasty. FLUOROSCOPY TIME:  28 seconds. C-arm fluoroscopic images were obtained intraoperatively and submitted for post operative interpretation. Electronically Signed   By: Orville Govern.D.  On: 12/05/2017 00:10        Scheduled Meds: . aspirin  81 mg Oral BH-q7a  . clonazePAM  2 mg Oral QHS  . docusate sodium  100 mg Oral BID  . doxazosin  2 mg Oral Daily  . enoxaparin (LOVENOX) injection  80 mg Subcutaneous BID  . finasteride  5 mg Oral Daily  . furosemide  40 mg Oral Daily  . galantamine  8 mg Oral BID  . hydrocortisone cream   Topical BID  . ipratropium  2 spray Each Nare BID  . metoprolol tartrate  25 mg Oral BID  . multivitamin with minerals  1 tablet Oral Daily  . Warfarin - Pharmacist Dosing Inpatient   Does not apply q1800   Continuous Infusions: . lactated ringers 10 mL/hr at 12/04/17 1928     LOS: 6 days     Vernell Leep, MD, [place], [facility]. [facility] Pager [phone]  If 7PM-7AM, please contact night-coverage [email or web address] Password [email or web address] 12/05/2017, 5:40 PM

## 2017-12-05 NOTE — Progress Notes (Signed)
Physical Therapy Treatment Patient Details Name: [name] MRN: [id number] DOB: 02/15/1930 Today's Date: 12/05/2017    History of Present Illness 84 y.o. male with medical history significant of hypertension, stroke, GERD, anxiety, BPH, third-degree AV block, pacemaker placement, OSA on CPAP, atrial fibrillation on Coumadin, transitional renal cell carcinoma (nephrectomy), dementia, CKD 3, who presents with fall and left hip pain X-ray in the office show left hip subcapital fracture slightly impacted. s/p direct anterior L THA.    PT Comments    Pt is making good progress towards his goal, however continues to be limited in safe mobility by his impulsivity and decreased safety awareness coupled with his decreased strength and balance. Pt continues to need min Ax2 for transfers, and mod A for steadying with ambulation of 200 feet with RW. Pt with c/o of dizziness with ambulation but refused to sit or even stop ambulation for it to resolve. Given pt prior high level of function and good family support pt would benefit from CIR level rehab. PT will continue to follow acutely.    Follow Up Recommendations  Follow surgeon's recommendation for DC plan and follow-up therapies     Equipment Recommendations  3in1 (PT);Rolling walker with 5" wheels    Recommendations for Other Services OT consult     Precautions / Restrictions Precautions Precautions: Fall Precaution Comments: hip fx result of fall, impulsive  Restrictions Weight Bearing Restrictions: Yes LLE Weight Bearing: Weight bearing as tolerated    Mobility  Bed Mobility               General bed mobility comments: up in recliner  Transfers Overall transfer level: Needs assistance Equipment used: Rolling walker (2 wheeled) Transfers: Sit to/from Stand Sit to Stand: Min assist;+2 safety/equipment         General transfer comment: min A to steady, cues for hand placement. to/from recliner and 3n1.    Ambulation/Gait Ambulation/Gait assistance: Mod assist;+2 safety/equipment Ambulation Distance (Feet): 150 Feet Assistive device: Rolling walker (2 wheeled) Gait Pattern/deviations: Step-to pattern;Decreased step length - right;Decreased stance time - left;Decreased weight shift to left;Shuffle;Trunk flexed;Wide base of support Gait velocity: slow Gait velocity interpretation: <1.31 ft/sec, indicative of household ambulator General Gait Details: modA for steadying with gait, pt with continued decreased safety awareness with gait, requiring max cuing for proximity to RW, and for navigation around obstacles       Balance Overall balance assessment: Needs assistance Sitting-balance support: Bilateral upper extremity supported;Feet supported Sitting balance-Leahy Scale: Fair     Standing balance support: Bilateral upper extremity supported Standing balance-Leahy Scale: Poor                              Cognition Arousal/Alertness: Lethargic Behavior During Therapy: Impulsive Overall Cognitive Status: Impaired/Different from baseline Area of Impairment: Following commands;Safety/judgement;Awareness;Problem solving                       Following Commands: Follows one step commands inconsistently;Follows multi-step commands inconsistently Safety/Judgement: Decreased awareness of safety Awareness: Intellectual Problem Solving: Slow processing;Difficulty sequencing;Requires verbal cues;Requires tactile cues General Comments: daughter says there are some memory problems at baseline, but he is more impulsive than normal         General Comments General comments (skin integrity, edema, etc.): Pt with complaints of dizziness with ambulation, VSS.  Pt daughter and wife present during session       Pertinent Vitals/Pain Pain Assessment: No/denies pain  Home Living Family/patient expects to be discharged to:: Private residence Living Arrangements:  Spouse/significant other Available Help at Discharge: Family;Available 24 hours/day;Other (Comment)(wife has MS) Type of Home: House Home Access: Stairs to enter   Home Layout: One level Home Equipment: Environmental consultant - 2 wheels;Shower seat;Hand held shower head;Grab bars - tub/shower      Prior Function Level of Independence: Independent with assistive device(s)      Comments: attends [employer], independent with ADLs, iADLs   PT Goals (Esch goals can now be found in the care plan section) Acute Rehab PT Goals Patient Stated Goal: to walk dog PT Goal Formulation: With patient/family Time For Goal Achievement: 12/19/17 Potential to Achieve Goals: Fair Progress towards PT goals: Progressing toward goals    Frequency    7X/week      PT Plan [place] plan remains appropriate    Co-evaluation PT/OT/SLP Co-Evaluation/Treatment: Yes Reason for Co-Treatment: For patient/therapist safety PT goals addressed during session: Mobility/safety with mobility OT goals addressed during session: ADL's and self-care      AM-PAC PT "6 Clicks" Daily Activity  Outcome Measure  Difficulty turning over in bed (including adjusting bedclothes, sheets and blankets)?: A Lot Difficulty moving from lying on back to sitting on the side of the bed? : Unable Difficulty sitting down on and standing up from a chair with arms (e.g., wheelchair, bedside commode, etc,.)?: Unable Help needed moving to and from a bed to chair (including a wheelchair)?: A Lot Help needed walking in hospital room?: A Lot Help needed climbing 3-5 steps with a railing? : Total 6 Click Score: 9    End of Session Equipment Utilized During Treatment: Gait belt Activity Tolerance: Patient limited by fatigue Patient left: in chair;with call bell/phone within reach;with chair alarm set;with family/visitor present Nurse Communication: Mobility status PT Visit Diagnosis: Unsteadiness on feet (R26.81);Other abnormalities of gait  and mobility (R26.89);Repeated falls (R29.6);History of falling (Z91.81);Muscle weakness (generalized) (M62.81);Difficulty in walking, not elsewhere classified (R26.2);Dizziness and giddiness (R42)     Time: 6384-6659 PT Time Calculation (min) (ACUTE ONLY): 27 min  Charges:  $Gait Training: 8-22 mins                    G Codes:       [place] Doig B. Migdalia Dk PT, DPT Acute Rehabilitation  [phone] Pager [phone]     [place] 12/05/2017, 3:44 PM

## 2017-12-05 NOTE — Evaluation (Signed)
Physical Therapy Evaluation Patient Details Name: [name] MRN: [id number] DOB: May 06, 1930 Today's Date: 12/05/2017   History of Present Illness  84 y.o. male with medical history significant of hypertension, stroke, GERD, anxiety, BPH, third-degree AV block, pacemaker placement, OSA on CPAP, atrial fibrillation on Coumadin, transitional renal cell carcinoma (nephrectomy), dementia, CKD 3, who presents with fall and left hip pain X-ray in the office show left hip subcapital fracture slightly impacted. s/p direct anterior L THA.  Clinical Impression  PTA pt independent in mobility without AD, attends [employer] and was independent in ADLs and iADLs. Daughter reports mild memory problems at baseline. Pt on BSC on entry requiring minAx2 for power up to standing and minA x1 for steadying with pericare. Pt requires modA for safety with ambulation with RW, after ambulation of 6 feet pt with c/o dizziness. Pt encouraged to perform pursed lipped breathing and returned to recliner. Upon sitting SaO2 98%O2 on RA, HR 110 bpm, and dizziness quickly subsided.  Given pt's increased need for assistance and limited distance of ambulation, paired with his impulsive nature, and decreased safety awareness, PT recommends SNF level rehab at d/c to improve strength, endurance and safety awareness before going home. PT will continue to follow acutely.     Follow Up Recommendations Follow surgeon's recommendation for DC plan and follow-up therapies;Other (comment)    Equipment Recommendations  3in1 (PT);Rolling walker with 5" wheels    Recommendations for Other Services OT consult     Precautions / Restrictions Precautions Precautions: Fall Precaution Comments: hip fx result of fall, impulsive  Restrictions Weight Bearing Restrictions: Yes LLE Weight Bearing: Weight bearing as tolerated      Mobility  Bed Mobility               General bed mobility comments: OOB on BSC on  entry  Transfers Overall transfer level: Needs assistance Equipment used: Rolling walker (2 wheeled) Transfers: Sit to/from Stand Sit to Stand: Min assist;+2 safety/equipment;Mod assist         General transfer comment: minAx2 for powerup into standing for pericare, minAx1 for stability in standing, vc for upright posture, modA for sitting in recliner due to no eccentric control of descent  Ambulation/Gait Ambulation/Gait assistance: Mod assist;+2 safety/equipment Ambulation Distance (Feet): 15 Feet Assistive device: Rolling walker (2 wheeled) Gait Pattern/deviations: Step-to pattern;Decreased step length - right;Decreased stance time - left;Decreased weight shift to left;Shuffle;Trunk flexed;Wide base of support Gait velocity: slow Gait velocity interpretation: <1.31 ft/sec, indicative of household ambulator General Gait Details: modA for steadying with gait, vc for sequencing, and directionality, proximity to walker and upright posture, requires tactile assist for navigation around obstacles with RW, c/o of dizziness after 6 feet of ambulation vc for increased deep breathing and returned to recliner      Balance Overall balance assessment: Needs assistance Sitting-balance support: Bilateral upper extremity supported;Feet supported Sitting balance-Leahy Scale: Fair     Standing balance support: Bilateral upper extremity supported Standing balance-Leahy Scale: Poor                               Pertinent Vitals/Pain Pain Assessment: No/denies pain    Home Living Family/patient expects to be discharged to:: Private residence Living Arrangements: Spouse/significant other Available Help at Discharge: Family;Available 24 hours/day;Other (Comment)(wife has MS ) Type of Home: House Home Access: Stairs to enter   [employer] of Steps: 1 Home Layout: One level Home Equipment: Walker - 2 wheels;Shower  seat;Hand held shower head;Grab bars - tub/shower       Prior Function Level of Independence: Independent with assistive device(s)         Comments: attends [employer], independent with ADLs, iADLs     Hand Dominance        Extremity/Trunk Assessment   Upper Extremity Assessment Upper Extremity Assessment: Overall WFL for tasks assessed    Lower Extremity Assessment Lower Extremity Assessment: LLE deficits/detail;Difficult to assess due to impaired cognition;RLE deficits/detail RLE Deficits / Details: AROM WFL, strength grossly assessed from functional movement to be 3+/5 LLE Deficits / Details: L hip THA, knee and ankle ROM WFL, strength grossly assessed from fuctional mobility to be 3+/5       Communication   Communication: HOH;No difficulties  Cognition Arousal/Alertness: Lethargic Behavior During Therapy: Impulsive Overall Cognitive Status: Impaired/Different from baseline Area of Impairment: Following commands;Safety/judgement;Awareness;Problem solving                       Following Commands: Follows one step commands inconsistently;Follows multi-step commands inconsistently Safety/Judgement: Decreased awareness of safety Awareness: Intellectual Problem Solving: Slow processing;Difficulty sequencing;Requires verbal cues;Requires tactile cues General Comments: daughter says there are some memory problems at baseline, but he is more impulsive than normal      General Comments General comments (skin integrity, edema, etc.): Pt with complaints of dizziness with ambulation, SaO2 on RA 98%O2 once seated in recliner        Assessment/Plan    PT Assessment Patient needs continued PT services  PT Problem List Decreased strength;Decreased activity tolerance;Decreased balance;Decreased mobility;Decreased range of motion;Decreased cognition;Decreased knowledge of use of DME;Decreased safety awareness       PT Treatment Interventions DME instruction;Gait training;Stair training;Functional mobility  training;Therapeutic activities;Therapeutic exercise;Balance training;Cognitive remediation;Patient/family education    PT Goals ([place] goals can be found in the Care Plan section)  Acute Rehab PT Goals Patient Stated Goal: go to sleep PT Goal Formulation: With patient/family Time For Goal Achievement: 12/19/17 Potential to Achieve Goals: Fair    Frequency 7X/week   Barriers to discharge Decreased caregiver support         AM-PAC PT "6 Clicks" Daily Activity  Outcome Measure Difficulty turning over in bed (including adjusting bedclothes, sheets and blankets)?: A Lot Difficulty moving from lying on back to sitting on the side of the bed? : Unable Difficulty sitting down on and standing up from a chair with arms (e.g., wheelchair, bedside commode, etc,.)?: Unable Help needed moving to and from a bed to chair (including a wheelchair)?: A Lot Help needed walking in hospital room?: A Lot Help needed climbing 3-5 steps with a railing? : Total 6 Click Score: 9    End of Session Equipment Utilized During Treatment: Gait belt Activity Tolerance: Patient limited by fatigue Patient left: in chair;with call bell/phone within reach;with chair alarm set;with family/visitor present Nurse Communication: Mobility status PT Visit Diagnosis: Unsteadiness on feet (R26.81);Other abnormalities of gait and mobility (R26.89);Repeated falls (R29.6);History of falling (Z91.81);Muscle weakness (generalized) (M62.81);Difficulty in walking, not elsewhere classified (R26.2);Dizziness and giddiness (R42)    Time: 9833-8250 PT Time Calculation (min) (ACUTE ONLY): 25 min   Charges:   PT Evaluation $PT Eval Moderate Complexity: 1 Mod PT Treatments $Gait Training: 8-22 mins   PT G Codes:        Zaley Talley B. Migdalia Dk PT, DPT Acute Rehabilitation  [phone] Pager [phone]    [place] 12/05/2017, 10:56 AM

## 2017-12-05 NOTE — Anesthesia Postprocedure Evaluation (Signed)
Anesthesia Post Note  Patient: [name]  Procedure(s) Performed: TOTAL HIP ARTHROPLASTY ANTERIOR APPROACH WITH CABLE (Left Hip)     Patient location during evaluation: PACU Anesthesia Type: Spinal Level of consciousness: awake and alert Pain management: pain level controlled Vital Signs Assessment: post-procedure vital signs reviewed and stable Respiratory status: spontaneous breathing and respiratory function stable Cardiovascular status: blood pressure returned to baseline and stable Postop Assessment: spinal receding and no apparent nausea or vomiting Anesthetic complications: no    Last Vitals:  Vitals:   12/05/17 0200 12/05/17 0348  BP:  (!) 145/88  Pulse: 78 81  Resp: 18 17  Temp:  36.7 C  SpO2: 99% 99%    Last Pain:  Vitals:   12/05/17 0541  TempSrc:   PainSc: Betances E Brock

## 2017-12-05 NOTE — Consult Note (Signed)
Physical Medicine and Rehabilitation Consult Reason for Consult: Decreased functional mobility Referring Physician: [facility]   HPI: [name] is a 84 y.o. right-handed male with history of hypertension, CVA, third-degree AV block with permanent pacemaker, atrial fibrillation maintained on chronic Coumadin, transitional renal cell carcinoma with nephrectomy, dementia, CKD stage III.  History taken from chart review and patient. Patient lives with his wife in [place].  Independent prior to admission and attends the [facility] regularly.  Wife has multiple sclerosis and limited physically.  They have a son in the area that works.  One level home with one-step to entry.  Presented 11/29/2017 after a fall without loss of consciousness.  X-rays and imaging revealed minimally displaced transcervical fracture through the left femoral neck.  INR on admission of 2.85.  Follow-up cardiology services in regards to history of atrial fibrillation with complete heart block and permanent pacemaker.  Patient did receive vitamin K to reverse INR.  Echocardiogram with ejection fraction of 65% no wall motion abnormalities.  Patient was cleared for surgery underwent left total hip arthroplasty anterior approach 12/04/2017 per Dr. Lyla Glassing.  Hospital course pain management.  Chronic Coumadin has been resumed.  Patient is weightbearing as tolerated.  Physical and occupational therapy evaluations pending.  MD has requested physical medicine rehab consult.   Review of Systems  Constitutional: Negative for chills and fever.  HENT: Negative for hearing loss.   Eyes: Negative for blurred vision and double vision.  Respiratory: Negative for cough and shortness of breath.   Gastrointestinal: Positive for constipation. Negative for nausea and vomiting.  Genitourinary: Positive for urgency.  Musculoskeletal: Positive for falls and myalgias.  Skin: Negative for rash.  All other systems reviewed and are negative.  Past  Medical History:  Diagnosis Date  . Allergic rhinitis   . Arthritis   . BPH (benign prostatic hyperplasia)   . Bronchitis   . Complete heart block ([place])    pacemaker originally placed in 1988  . Dementia   . Esophageal reflux   . Hypertension   . Lung nodule    noted on CXR December 2012  . Normal nuclear stress test 2013  . OSA (obstructive sleep apnea)    uses cpap  . Permanent atrial fibrillation ([facility])    on coumadin  . Pneumonia   . Presence of permanent cardiac pacemaker   . Renal insufficiency    Rt kidney removed d/t maglinant tumor  . RLS (restless legs syndrome)   . Stroke [facility])    TIA greater than 20 years  . Transitional cell carcinoma ([place])    s/p nephrectomy in  2010   Past Surgical History:  Procedure Laterality Date  . CARDIOVASCULAR STRESS TEST  03/23/2009   EF 65%, NORMAL  . CATARACT EXTRACTION W/PHACO Left 08/26/2015   Procedure: CATARACT EXTRACTION PHACO AND INTRAOCULAR LENS PLACEMENT (IOC);  Surgeon: Marylynn Pearson, MD;  Location: [facility];  Service: Ophthalmology;  Laterality: Left;  . DOPPLER ECHOCARDIOGRAPHY  03/26/2001   EF 55%  . EYE SURGERY     tumor removed from Rt eye lid  . HERNIA REPAIR     multiple  . MASS EXCISION Left 11/07/2012   Procedure: MINOR EXCISION OF MASS;  Surgeon: Haywood Lasso, MD;  Location: [facility];  Service: General;  Laterality: Left;  . NEPHRECTOMY  2010   rt  . PACEMAKER INSERTION  1988   most recent generator change by Dr Rayann Heman 20067/2/13 with new right ventricular lead placed  .  PERMANENT PACEMAKER GENERATOR CHANGE N/A 01/03/2012   Procedure: PERMANENT PACEMAKER GENERATOR CHANGE;  Surgeon: Thompson Grayer, MD;  Location: [facility] CATH LAB;  Service: Cardiovascular;  Laterality: N/A;  . US ECHOCARDIOGRAPHY  04/22/2008   EF 55-60%   Family History  Problem Relation Age of Onset  . Stroke Mother   . Stroke Father    Social History:  reports that he has never smoked. He has never used smokeless tobacco. He  reports that he does not drink alcohol or use drugs. Allergies: No Known Allergies Medications Prior to Admission  Medication Sig Dispense Refill  . acetaminophen (TYLENOL) 325 MG tablet Take 650 mg by mouth every 4 (four) hours as needed for mild pain or fever.     Marland Kitchen aspirin 81 MG tablet Take 81 mg by mouth daily.     . clonazePAM (KLONOPIN) 2 MG tablet Take 1 tablet (2 mg total) by mouth at bedtime. 90 tablet 1  . cycloSPORINE (RESTASIS) 0.05 % ophthalmic emulsion Place 1 drop into both eyes 2 (two) times daily.     Marland Kitchen doxazosin (CARDURA) 2 MG tablet Take 2 mg by mouth daily.    . finasteride (PROSCAR) 5 MG tablet Take 5 mg by mouth daily.    . furosemide (LASIX) 40 MG tablet Take 1 tablet (40 mg total) by mouth daily. 90 tablet 3  . galantamine (RAZADYNE) 8 MG tablet Take 8 mg by mouth 2 (two) times daily.    Marland Kitchen glucosamine-chondroitin 500-400 MG tablet Take 1 tablet by mouth 2 (two) times daily.     Marland Kitchen ipratropium (ATROVENT) 0.06 % nasal spray Place 2 sprays into both nostrils 2 (two) times daily.    . metoprolol tartrate (LOPRESSOR) 25 MG tablet Take 25 mg by mouth 2 (two) times daily.    . Multiple Vitamin (MULTIVITAMIN) capsule Take 1 capsule by mouth daily.      Marland Kitchen warfarin (COUMADIN) 5 MG tablet Take as directed by coumadin clinic (Patient taking differently: Take 2.5-5 mg by mouth See admin instructions. Take 1/2 tablet on Monday then take 1 tablet all the other days) 90 tablet 0    Home: Home Living Family/patient expects to be discharged to:: Other (Comment)(family wants CIR) Living Arrangements: Spouse/significant other  Functional History:   Functional Status:  Mobility:          ADL:    Cognition: Cognition Orientation Level: Oriented X4    Blood pressure (!) 145/88, pulse 81, temperature 98 F (36.7 C), temperature source Oral, resp. rate 17, height 5\' 10"  (1.778 m), weight 81.6 kg (180 lb), SpO2 99 %. Physical Exam  Vitals reviewed. Constitutional: He appears  well-developed and well-nourished.  HENT:  Head: Normocephalic and atraumatic.  Eyes: EOM are normal. Right eye exhibits no discharge. Left eye exhibits no discharge.  Neck: Normal range of motion. Neck supple. No thyromegaly present.  Cardiovascular:  Cardiac rate controlled  Respiratory: Effort normal and breath sounds normal. No respiratory distress.  GI: Soft. Bowel sounds are normal. He exhibits no distension.  Musculoskeletal:  Left hip with tenderness  Neurological: He is alert.  Provides his name, age and date of birth.   He does exhibit some delay in processing. Motor: Bilateral upper extremities 4 -/5 proximal to distal Right lower extremity: Hip flexion, knee extension 4 -/5, ankle dorsiflexion 4+/5 Left lower extremity: Hip flexion, knee extension 3 -/5, ankle dorsiflexion 4+/5  Skin:  Left hip incision clean and dry with wound VAC in place  Psychiatric: He has a normal  mood and affect. His behavior is normal.    No results found for this or any previous visit (from the past 24 hour(s)). Pelvis Portable  Result Date: 12/05/2017 CLINICAL DATA:  Left total hip replacement. EXAM: PORTABLE PELVIS 1-2 VIEWS COMPARISON:  Left hip x-rays dated Nov 29, 2017. FINDINGS: The left hip demonstrates a total arthroplasty without evidence of hardware failure or complication. Single cerclage wire around the greater trochanter. There is expected intra-articular air. There is no fracture or dislocation. The alignment is anatomic. Post-surgical changes noted in the surrounding soft tissues. The right hip is unremarkable. IMPRESSION: 1. Interval left total hip arthroplasty without evidence of acute postoperative complication. Electronically Signed   By: Titus Dubin M.D.   On: 12/05/2017 00:11   Dg C-arm 1-60 Min  Result Date: 12/05/2017 CLINICAL DATA:  Left hip arthroplasty. EXAM: OPERATIVE LEFT HIP (WITH PELVIS IF PERFORMED) 3 VIEWS TECHNIQUE: Fluoroscopic spot image(s) were submitted for  interpretation post-operatively. COMPARISON:  Left hip x-rays dated Nov 29, 2017. FINDINGS: Intraoperative x-rays demonstrating interval left total hip arthroplasty with single cerclage wire around the greater trochanter. Components are well aligned. IMPRESSION: Interval left total hip arthroplasty. FLUOROSCOPY TIME:  28 seconds. C-arm fluoroscopic images were obtained intraoperatively and submitted for post operative interpretation. Electronically Signed   By: Titus Dubin M.D.   On: 12/05/2017 00:10   Dg C-arm 1-60 Min  Result Date: 12/05/2017 CLINICAL DATA:  Left hip arthroplasty. EXAM: OPERATIVE LEFT HIP (WITH PELVIS IF PERFORMED) 3 VIEWS TECHNIQUE: Fluoroscopic spot image(s) were submitted for interpretation post-operatively. COMPARISON:  Left hip x-rays dated Nov 29, 2017. FINDINGS: Intraoperative x-rays demonstrating interval left total hip arthroplasty with single cerclage wire around the greater trochanter. Components are well aligned. IMPRESSION: Interval left total hip arthroplasty. FLUOROSCOPY TIME:  28 seconds. C-arm fluoroscopic images were obtained intraoperatively and submitted for post operative interpretation. Electronically Signed   By: Titus Dubin M.D.   On: 12/05/2017 00:10   Dg Hip Operative Unilat With Pelvis Left  Result Date: 12/05/2017 CLINICAL DATA:  Left hip arthroplasty. EXAM: OPERATIVE LEFT HIP (WITH PELVIS IF PERFORMED) 3 VIEWS TECHNIQUE: Fluoroscopic spot image(s) were submitted for interpretation post-operatively. COMPARISON:  Left hip x-rays dated Nov 29, 2017. FINDINGS: Intraoperative x-rays demonstrating interval left total hip arthroplasty with single cerclage wire around the greater trochanter. Components are well aligned. IMPRESSION: Interval left total hip arthroplasty. FLUOROSCOPY TIME:  28 seconds. C-arm fluoroscopic images were obtained intraoperatively and submitted for post operative interpretation. Electronically Signed   By: Titus Dubin M.D.   On:  12/05/2017 00:10    Assessment/Plan: Diagnosis: left hip fracture Labs independently reviewed.  Records reviewed and summated above. Oral pharmacological pain control  Bowel program: consider colace, miralax and/or Senna. PRN suppository Fall precautions Monitor surgical wound and skin especially over pressure sensitive areas Prevent immobility complications: pressure ulcers, contractures, HO Consider modalities, such as TENs, CPM  1. Does the need for close, 24 hr/day medical supervision in concert with the patient's rehab needs make it unreasonable for this patient to be served in a less intensive setting? Potentially  2. Co-Morbidities requiring supervision/potential complications: HTN (monitor and provide prns in accordance with increased physical exertion and pain), hx of CVA, third-degree AV block (permanent pacemaker), atrial fibrillation (continue meds, monitor heart rate with increased physical activity), transitional renal cell carcinoma with nephrectomy, dementia, CKD stage III (avoid nephrotoxic meds), post-op pain management (Biofeedback training with therapies to help reduce reliance on opiate pain medications, particularly IV morphine, monitor pain control  during therapies, and sedation at rest and titrate to maximum efficacy to ensure participation and gains in therapies), ABLA (transfuse if necessary to ensure appropriate perfusion for increased activity tolerance) 3. Due to bladder management, safety, skin/wound care, disease management, medication administration, pain management and patient education, does the patient require 24 hr/day rehab nursing? Yes 4. Does the patient require coordinated care of a physician, rehab nurse, PT (1-2 hrs/day, 5 days/week) and OT (1-2 hrs/day, 5 days/week) to address physical and functional deficits in the context of the above medical diagnosis(es)? Potentially Addressing deficits in the following areas: balance, endurance, locomotion, strength,  transferring, bathing, dressing, toileting and psychosocial support 5. Can the patient actively participate in an intensive therapy program of at least 3 hrs of therapy per day at least 5 days per week? Potentially 6. The potential for patient to make measurable gains while on inpatient rehab is TBD. 7. Anticipated functional outcomes upon discharge from inpatient rehab are TBD  with PT, TBD with OT, n/a with SLP. 8. Estimated rehab length of stay to reach the above functional goals is: TBD. 9. Anticipated D/C setting: TBD 10. Anticipated post D/C treatments: HH therapy and Home excercise program 11. Overall Rehab/Functional Prognosis: good  RECOMMENDATIONS: This patient's condition is appropriate for continued rehabilitative care in the following setting: Will need to await therapy evaluations to determine most appropriate disposition. Patient has agreed to participate in recommended program. Potentially Note that insurance prior authorization may be required for reimbursement for recommended care.  Comment: Rehab Admissions Coordinator to follow up.   I have personally performed a face to face diagnostic evaluation, including, but not limited to relevant history and physical exam findings, of this patient and developed relevant assessment and plan.  Additionally, I have reviewed and concur with the physician assistant's documentation above.   Delice Lesch, MD, ABPMR Lavon Paganini Angiulli, PA-C 12/05/2017

## 2017-12-05 NOTE — Progress Notes (Signed)
ANTICOAGULATION CONSULT NOTE  Pharmacy Consult:  Lovenox / Coumadin Indication: atrial fibrillation  No Known Allergies  Patient Measurements: Height: 5\' 10"  (177.8 cm) Weight: 180 lb (81.6 kg) IBW/kg (Calculated) : 73  Vital Signs: Temp: 98 F (36.7 C) (06/04 0348) Temp Source: Oral (06/04 0348) BP: 145/88 (06/04 0348) Pulse Rate: 81 (06/04 0348)  Labs: Recent Labs    12/03/17 0334 12/03/17 1334 12/03/17 2241 12/04/17 0435  HGB 11.1*  --   --  11.1*  HCT 34.7*  --   --  34.8*  PLT 203  --   --  211  LABPROT 15.7*  --   --   --   INR 1.27  --   --   --   HEPARINUNFRC 0.31 0.19* 0.35  --   CREATININE 1.64*  --   --  1.38*    Estimated Creatinine Clearance: 39.7 mL/min (A) (by C-G formula based on SCr of 1.38 mg/dL (H)).  Assessment: 88 YOM on Coumadin PTA for Afib.  He received Vitamin K x3 doses and then started on IV heparin bridge.  Now s/p hip surgery on 12/04/17 and patient to resume Coumadin with full-dose Lovenox bridge.  INR was sub-therapeutic yesterday; today's lab is pending collection but expect INR to increase minimally if it did.  Renal function was improving and estimated CrCL > 30 ml/min.  No bleeding reported.  Home Coumadin dose:  5mg  PO daily except 2.5mg  on Monday.  MD requested to resume home regimen, no loading.   Goal of Therapy:  INR 2 - 3 Anti-Xa level 0.6-1 units/mL 4hrs after LMWH dose Monitor platelets by anticoagulation protocol: Yes    Plan:  Coumadin 5mg  PO today Change Lovenox to 80mg  SQ Q12H Daily PT / INR, CBC daily x14 occurrences per MD   Remigio Eisenmenger D. Mina Marble, PharmD, BCPS, [place] Pager:  [phone] 12/05/2017, 12:20 PM

## 2017-12-05 NOTE — Progress Notes (Signed)
Pt BP is soft, low 80's/50's manually.  Call placed to notify MD. Awaiting response. [email or web address]

## 2017-12-05 NOTE — Progress Notes (Signed)
    Subjective:  Patient reports pain as mild to moderate.  Denies N/V/CP/SOB. No c/o.  Objective:   VITALS:   Vitals:   12/05/17 0015 12/05/17 0047 12/05/17 0200 12/05/17 0348  BP: (!) 142/61 (!) 151/92  (!) 145/88  Pulse: 70 71 78 81  Resp: 14 16 18 17   Temp: 97.9 F (36.6 C) 97.8 F (36.6 C)  98 F (36.7 C)  TempSrc:  Oral  Oral  SpO2: 99% 100% 99% 99%  Weight:      Height:        NAD ABD soft Intact pulses distally Dorsiflexion/Plantar flexion intact Incision: dressing C/D/I Compartment soft Prevena intact  Lab Results  Component Value Date   WBC 4.9 12/04/2017   HGB 11.1 (L) 12/04/2017   HCT 34.8 (L) 12/04/2017   MCV 99.1 12/04/2017   PLT 211 12/04/2017   BMET    Component Value Date/Time   NA 140 12/04/2017 0435   K 4.3 12/04/2017 0435   CL 112 (H) 12/04/2017 0435   CO2 25 12/04/2017 0435   GLUCOSE 112 (H) 12/04/2017 0435   BUN 27 (H) 12/04/2017 0435   CREATININE 1.38 (H) 12/04/2017 0435   CALCIUM 8.8 (L) 12/04/2017 0435   GFRNONAA 45 (L) 12/04/2017 0435   GFRAA 52 (L) 12/04/2017 0435     Assessment/Plan: 1 Day Post-Op   Principal Problem:   Closed left hip fracture (HCC) Active Problems:   Obstructive sleep apnea   FIBRILLATION, ATRIAL   Long term ([place]) use of anticoagulants   Pacemaker-Medtr[employer]   Lower leg edema   Fall   Essential hypertension   Stroke (cerebrum) (HCC)   BPH (benign prostatic hyperplasia)   Dementia   CKD (chronic kidney disease), stage III (HCC)   Closed right hip fracture, initial encounter (HCC)   Diarrhea   Closed displaced fracture of left femoral neck (HCC)   WBAT with walker DVT ppx: Lovenox and Coumadin, SCDs, TEDS PO pain control PT/OT Am labs pending Dispo: D/C home with HHPT when medically stable, plan to convert prevena to portable home unit just prior to d/c   Hilton Cork Kei Langhorst 12/05/2017, 8:03 AM   Rod Can, MD Cell [phone]

## 2017-12-05 NOTE — Discharge Instructions (Addendum)
Dr. Rod Can Joint Replacement Specialist [facility] [address], [place], [place] [address] [phone]   TOTAL HIP REPLACEMENT POSTOPERATIVE DIRECTIONS    Hip Rehabilitation, Guidelines Following Surgery   WEIGHT BEARING Weight bearing as tolerated with assist device (walker, cane, etc) as directed, use it as long as suggested by your surgeon or therapist, typically at least 4-6 weeks.  The results of a hip operation are greatly improved after range of motion and muscle strengthening exercises. Follow all safety measures which are given to protect your hip. If any of these exercises cause increased pain or swelling in your joint, decrease the amount until you are comfortable again. Then slowly increase the exercises. Call your caregiver if you have problems or questions.   HOME CARE INSTRUCTIONS  Most of the following instructions are designed to prevent the dislocation of your new hip.  Remove items at home which could result in a fall. This includes throw rugs or furniture in walking pathways.  Continue medications as instructed at time of discharge.  You may have some home medications which will be placed on hold until you complete the course of blood thinner medication. Do not put on socks or shoes without following the instructions of your caregivers.   Sit on chairs with arms. Use the chair arms to help push yourself up when arising.  Arrange for the use of a toilet seat elevator so you are not sitting low.   Walk with walker as instructed.  You may resume a sexual relationship in one month or when given the OK by your caregiver.  Use walker as long as suggested by your caregivers.  You may put full weight on your legs and walk as much as is comfortable. Avoid periods of inactivity such as sitting longer than an hour when not asleep. This helps prevent blood clots.  You may return to work once you are cleared by Engineer, production.  Do not  drive a car for 6 weeks or until released by your surgeon.  Do not drive while taking narcotics.  Wear elastic stockings for two weeks following surgery during the day but you may remove then at night.  Make sure you keep all of your appointments after your operation with all of your doctors and caregivers. You should call the office at the above phone number and make an appointment for approximately two weeks after the date of your surgery. Please pick up a stool softener and laxative for home use as long as you are requiring pain medications.  ICE to the affected hip every three hours for 30 minutes at a time and then as needed for pain and swelling. Continue to use ice on the hip for pain and swelling from surgery. You may notice swelling that will progress down to the foot and ankle.  This is normal after surgery.  Elevate the leg when you are not up walking on it.   It is important for you to complete the blood thinner medication as prescribed by your doctor.  Continue to use the breathing machine which will help keep your temperature down.  It is common for your temperature to cycle up and down following surgery, especially at night when you are not up moving around and exerting yourself.  The breathing machine keeps your lungs expanded and your temperature down.  RANGE OF MOTION AND STRENGTHENING EXERCISES  These exercises are designed to help you keep full movement of your hip joint. Follow your caregiver's  or physical therapist's instructions. Perform all exercises about fifteen times, three times per day or as directed. Exercise both hips, even if you have had only one joint replacement. These exercises can be done on a training (exercise) mat, on the floor, on a table or on a bed. Use whatever works the best and is most comfortable for you. Use music or television while you are exercising so that the exercises are a pleasant break in your day. This will make your life better with the exercises  acting as a break in routine you can look forward to.  Lying on your back, slowly slide your foot toward your buttocks, raising your knee up off the floor. Then slowly slide your foot back down until your leg is straight again.  Lying on your back spread your legs as far apart as you can without causing discomfort.  Lying on your side, raise your upper leg and foot straight up from the floor as far as is comfortable. Slowly lower the leg and repeat.  Lying on your back, tighten up the muscle in the front of your thigh (quadriceps muscles). You can do this by keeping your leg straight and trying to raise your heel off the floor. This helps strengthen the largest muscle supporting your knee.  Lying on your back, tighten up the muscles of your buttocks both with the legs straight and with the knee bent at a comfortable angle while keeping your heel on the floor.   SKILLED REHAB INSTRUCTIONS: If the patient is transferred to a skilled rehab facility following release from the hospital, a list of the Nie medications will be sent to the facility for the patient to continue.  When discharged from the skilled rehab facility, please have the facility set up the patient's [place] prior to being released. Also, the skilled facility will be responsible for providing the patient with their medications at time of release from the facility to include their pain medication and their blood thinner medication. If the patient is still at the rehab facility at time of the two week follow up appointment, the skilled rehab facility will also need to assist the patient in arranging follow up appointment in our office and any transportation needs.  MAKE SURE YOU:  Understand these instructions.  Will watch your condition.  Will get help right away if you are not doing well or get worse.  Pick up stool softner and laxative for home use following surgery while on pain medications. Do not remove your  dressing. Keep dressing clean and dry. Charge VAC unit nightly. Continue to use ice for pain and swelling after surgery. Do not use any lotions or creams on the incision until instructed by your surgeon. Total Hip Protocol.    Information on my medicine - Coumadin   (Warfarin)  This medication education was reviewed with me or my healthcare representative as part of my discharge preparation.  The pharmacist that spoke with me during my hospital stay was:  Saundra Shelling, [facility]  Why was Coumadin prescribed for you? Coumadin was prescribed for you because you have a blood clot or a medical condition that can cause an increased risk of forming blood clots. Blood clots can cause serious health problems by blocking the flow of blood to the heart, lung, or brain. Coumadin can prevent harmful blood clots from forming. As a reminder your indication for Coumadin is:   Stroke Prevention Because Of Atrial Fibrillation  What test will check  on my response to Coumadin? While on Coumadin (warfarin) you will need to have an INR test regularly to ensure that your dose is keeping you in the desired range. The INR (international normalized ratio) number is calculated from the result of the laboratory test called prothrombin time (PT).  If an INR APPOINTMENT HAS NOT ALREADY BEEN MADE FOR YOU please schedule an appointment to have this lab work done by your health care provider within 7 days. Your INR goal is usually a number between:  2 to 3 or your provider may give you a more narrow range like 2-2.5.  Ask your health care provider during an office visit what your goal INR is.  What  do you need to  know  About  COUMADIN? Take Coumadin (warfarin) exactly as prescribed by your healthcare provider about the same time each day.  DO NOT stop taking without talking to the doctor who prescribed the medication.  Stopping without other blood clot prevention medication to take the place of Coumadin may increase your  risk of developing a new clot or stroke.  Get refills before you run out.  What do you do if you miss a dose? If you miss a dose, take it as soon as you remember on the same day then continue your regularly scheduled regimen the next day.  Do not take two doses of Coumadin at the same time.  Important Safety Information A possible side effect of Coumadin (Warfarin) is an increased risk of bleeding. You should call your healthcare provider right away if you experience any of the following: ? Bleeding from an injury or your nose that does not stop. ? Unusual colored urine (red or dark brown) or unusual colored stools (red or black). ? Unusual bruising for unknown reasons. ? A serious fall or if you hit your head (even if there is no bleeding).  Some foods or medicines interact with Coumadin (warfarin) and might alter your response to warfarin. To help avoid this: ? Eat a balanced diet, maintaining a consistent amount of Vitamin K. ? Notify your provider about major diet changes you plan to make. ? Avoid alcohol or limit your intake to 1 drink for women and 2 drinks for men per day. (1 drink is 5 oz. wine, 12 oz. beer, or 1.5 oz. liquor.)  Make sure that ANY health care provider who prescribes medication for you knows that you are taking Coumadin (warfarin).  Also make sure the healthcare provider who is monitoring your Coumadin knows when you have started a new medication including herbals and non-prescription products.  Coumadin (Warfarin)  Major Drug Interactions  Increased Warfarin Effect Decreased Warfarin Effect  Alcohol (large quantities) Antibiotics (esp. Septra/Bactrim, Flagyl, Cipro) Amiodarone (Cordarone) Aspirin (ASA) Cimetidine (Tagamet) Megestrol (Megace) NSAIDs (ibuprofen, naproxen, etc.) Piroxicam (Feldene) Propafenone (Rythmol SR) Propranolol (Inderal) Isoniazid (INH) Posaconazole (Noxafil) Barbiturates (Phenobarbital) Carbamazepine (Tegretol) Chlordiazepoxide  (Librium) Cholestyramine (Questran) Griseofulvin Oral Contraceptives Rifampin Sucralfate (Carafate) Vitamin K   Coumadin (Warfarin) Major Herbal Interactions  Increased Warfarin Effect Decreased Warfarin Effect  Garlic Ginseng Ginkgo biloba Coenzyme Q10 Green tea St. Johns wort    Coumadin (Warfarin) FOOD Interactions  Eat a consistent number of servings per week of foods HIGH in Vitamin K (1 serving =  cup)  Collards (cooked, or boiled & drained) Kale (cooked, or boiled & drained) Mustard greens (cooked, or boiled & drained) Parsley *serving size only =  cup Spinach (cooked, or boiled & drained) Swiss chard (cooked, or boiled & drained) Turnip  greens (cooked, or boiled & drained)  Eat a consistent number of servings per week of foods MEDIUM-HIGH in Vitamin K (1 serving = 1 cup)  Asparagus (cooked, or boiled & drained) Broccoli (cooked, boiled & drained, or raw & chopped) Brussel sprouts (cooked, or boiled & drained) *serving size only =  cup Lettuce, raw (green leaf, endive, romaine) Spinach, raw Turnip greens, raw & chopped   These websites have more information on Coumadin (warfarin):  FailFactory.se; VeganReport.com.au;

## 2017-12-05 NOTE — Evaluation (Signed)
Occupational Therapy Evaluation Patient Details Name: [name] MRN: [id number] DOB: April 09, 1930 Today's Date: 12/05/2017    History of Present Illness 84 y.o. male with medical history significant of hypertension, stroke, GERD, anxiety, BPH, third-degree AV block, pacemaker placement, OSA on CPAP, atrial fibrillation on Coumadin, transitional renal cell carcinoma (nephrectomy), dementia, CKD 3, who presents with fall and left hip pain X-ray in the office show left hip subcapital fracture slightly impacted. s/p direct anterior L THA.   Clinical Impression   Pt admitted with the above diagnoses and presents with below problem list. Pt will benefit from continued acute OT to address the below listed deficits and maximize independence with basic ADLs prior to d/c to next venue. PTA pt was independent with ADLs and active. Pt just finished eating lunch at time of OT arrival and has been sitting up in recliner this morning. Pt completed household distance functional mobility and toilet transfer during session. Pt is currently min A +2 (safety/equipment) with functional mobility/transfers, mod A +2 safety with LB ADLs. Daughter present throughout session.      Follow Up Recommendations  CIR    Equipment Recommendations  Other (comment)(TBD next venue)    Recommendations for Other Services Rehab consult     Precautions / Restrictions Precautions Precautions: Fall Precaution Comments: hip fx result of fall, impulsive  Restrictions Weight Bearing Restrictions: Yes LLE Weight Bearing: Weight bearing as tolerated      Mobility Bed Mobility               General bed mobility comments: up in recliner  Transfers Overall transfer level: Needs assistance Equipment used: Rolling walker (2 wheeled) Transfers: Sit to/from Stand Sit to Stand: Min assist;+2 safety/equipment         General transfer comment: min A to steady, cues for hand placement. to/from recliner and 3n1.      Balance Overall balance assessment: Needs assistance Sitting-balance support: Bilateral upper extremity supported;Feet supported Sitting balance-Leahy Scale: Fair     Standing balance support: Bilateral upper extremity supported Standing balance-Leahy Scale: Poor                             ADL either performed or assessed with clinical judgement   ADL Overall ADL's : Needs assistance/impaired Eating/Feeding: Set up;Sitting   Grooming: Set up;Sitting   Upper Body Bathing: Set up;Sitting   Lower Body Bathing: Moderate assistance;Sit to/from stand;+2 for safety/equipment   Upper Body Dressing : Set up;Sitting   Lower Body Dressing: Moderate assistance;+2 for safety/equipment;Sit to/from stand   Toilet Transfer: Minimal assistance;+2 for safety/equipment;Ambulation;RW(3n1 over toilet)   Toileting- Clothing Manipulation and Hygiene: Moderate assistance;+2 for safety/equipment;Sit to/from stand Toileting - Clothing Manipulation Details (indicate cue type and reason): Pt reports he completed in seated position. Tub/ Shower Transfer: Minimal assistance;+2 for safety/equipment;Ambulation;Rolling walker;3 in 1   Functional mobility during ADLs: Minimal assistance;+2 for safety/equipment;Rolling walker General ADL Comments: Pt completed toilet transfer, pericare, and household distance functional mobility as detailed above. Reporting dizziness at times     Vision         Perception     Praxis      Pertinent Vitals/Pain Pain Assessment: No/denies pain     Hand Dominance     Extremity/Trunk Assessment Upper Extremity Assessment Upper Extremity Assessment: Overall WFL for tasks assessed   Lower Extremity Assessment Lower Extremity Assessment: Defer to PT evaluation       Communication Communication Communication: HOH;No difficulties  Cognition Arousal/Alertness: Awake/alert Behavior During Therapy: Impulsive Overall Cognitive Status: Impaired/Different  from baseline Area of Impairment: Following commands;Safety/judgement;Awareness;Problem solving                       Following Commands: Follows one step commands inconsistently;Follows multi-step commands inconsistently Safety/Judgement: Decreased awareness of safety Awareness: Intellectual Problem Solving: Slow processing;Difficulty sequencing;Requires verbal cues;Requires tactile cues General Comments: daughter says there are some memory problems at baseline, but he is more impulsive than normal   General Comments  Pt with complaints of dizziness with ambulation    Exercises     Shoulder Instructions      Home Living Family/patient expects to be discharged to:: Private residence Living Arrangements: Spouse/significant other Available Help at Discharge: Family;Available 24 hours/day;Other (Comment)(wife has MS) Type of Home: House Home Access: Stairs to enter [employer] of Steps: 1   Home Layout: One level     Bathroom Shower/Tub: Teacher, early years/pre: Standard Bathroom Accessibility: No   Home Equipment: Environmental consultant - 2 wheels;Shower seat;Hand held shower head;Grab bars - tub/shower          Prior Functioning/Environment Level of Independence: Independent with assistive device(s)        Comments: attends [employer], independent with ADLs, iADLs        OT Problem List: Impaired balance (sitting and/or standing);Decreased knowledge of use of DME or AE;Decreased knowledge of precautions;Pain;Decreased cognition      OT Treatment/Interventions: Self-care/ADL training;DME and/or AE instruction;Patient/family education;Balance training;Therapeutic activities    OT Goals([place] goals can be found in the care plan section) Acute Rehab OT Goals OT Goal Formulation: With patient Time For Goal Achievement: 12/12/17 Potential to Achieve Goals: Good  OT Frequency: Min 3X/week   Barriers to D/C:            Co-evaluation  PT/OT/SLP Co-Evaluation/Treatment: Yes Reason for Co-Treatment: Necessary to address cognition/behavior during functional activity;For patient/therapist safety;To address functional/ADL transfers   OT goals addressed during session: ADL's and self-care      AM-PAC PT "6 Clicks" Daily Activity     Outcome Measure Help from another person eating meals?: None Help from another person taking care of personal grooming?: A Little Help from another person toileting, which includes using toliet, bedpan, or urinal?: A Little Help from another person bathing (including washing, rinsing, drying)?: A Lot Help from another person to put on and taking off regular upper body clothing?: A Little Help from another person to put on and taking off regular lower body clothing?: A Lot 6 Click Score: 17   End of Session Equipment Utilized During Treatment: Gait belt;Rolling walker  Activity Tolerance: Patient tolerated treatment well;Other (comment)(some dizziness when walking) Patient left: in chair;with call bell/phone within reach;with chair alarm set;with family/visitor present  OT Visit Diagnosis: Unsteadiness on feet (R26.81);Pain;Other symptoms and signs involving cognitive function                Time: 5465-6812 OT Time Calculation (min): 20 min Charges:  OT General Charges $OT Visit: 1 Visit OT Evaluation $OT Eval Low Complexity: 1 Low G-Codes:       Hortencia Pilar 12/05/2017, 2:06 PM

## 2017-12-06 LAB — CBC
HEMATOCRIT: 25.1 % — AB (ref 39.0–52.0)
HEMOGLOBIN: 8 g/dL — AB (ref 13.0–17.0)
MCH: 31.6 pg (ref 26.0–34.0)
MCHC: 31.9 g/dL (ref 30.0–36.0)
MCV: 99.2 fL (ref 78.0–100.0)
Platelets: 170 10*3/uL (ref 150–400)
RBC: 2.53 MIL/uL — ABNORMAL LOW (ref 4.22–5.81)
RDW: 13.2 % (ref 11.5–15.5)
WBC: 7 10*3/uL (ref 4.0–10.5)

## 2017-12-06 LAB — BASIC METABOLIC PANEL
ANION GAP: 5 (ref 5–15)
BUN: 42 mg/dL — ABNORMAL HIGH (ref 6–20)
CHLORIDE: 108 mmol/L (ref 101–111)
CO2: 23 mmol/L (ref 22–32)
Calcium: 8.5 mg/dL — ABNORMAL LOW (ref 8.9–10.3)
Creatinine, Ser: 1.9 mg/dL — ABNORMAL HIGH (ref 0.61–1.24)
GFR calc non Af Amer: 30 mL/min — ABNORMAL LOW (ref >60)
GFR, EST AFRICAN AMERICAN: 35 mL/min — AB (ref >60)
GLUCOSE: 128 mg/dL — AB (ref 65–99)
Potassium: 4.3 mmol/L (ref 3.5–5.1)
Sodium: 136 mmol/L (ref 135–145)

## 2017-12-06 LAB — PREPARE RBC (CROSSMATCH)

## 2017-12-06 LAB — HEMOGLOBIN AND HEMATOCRIT, BLOOD
HCT: 26.3 % — ABNORMAL LOW (ref 39.0–52.0)
HEMOGLOBIN: 8.4 g/dL — AB (ref 13.0–17.0)

## 2017-12-06 LAB — PROTIME-INR
INR: 1.61
Prothrombin Time: 19 seconds — ABNORMAL HIGH (ref 11.4–15.2)

## 2017-12-06 LAB — GLUCOSE, CAPILLARY: Glucose-Capillary: 136 mg/dL — ABNORMAL HIGH (ref 65–99)

## 2017-12-06 MED ORDER — SODIUM CHLORIDE 0.9 % IV SOLN
Freq: Once | INTRAVENOUS | Status: AC
  Administered 2017-12-06: 21:00:00 via INTRAVENOUS

## 2017-12-06 MED ORDER — FERROUS SULFATE 325 (65 FE) MG PO TABS
325.0000 mg | ORAL_TABLET | Freq: Two times a day (BID) | ORAL | Status: DC
  Administered 2017-12-06: 325 mg via ORAL
  Filled 2017-12-06: qty 1

## 2017-12-06 MED ORDER — WARFARIN SODIUM 5 MG PO TABS
5.0000 mg | ORAL_TABLET | Freq: Once | ORAL | Status: AC
  Administered 2017-12-06: 5 mg via ORAL
  Filled 2017-12-06: qty 1

## 2017-12-06 MED ORDER — SODIUM CHLORIDE 0.9 % IV SOLN
INTRAVENOUS | Status: DC
  Administered 2017-12-06 – 2017-12-07 (×2): via INTRAVENOUS
  Administered 2017-12-08: 50 mL via INTRAVENOUS

## 2017-12-06 NOTE — Progress Notes (Signed)
Inpatient Rehabilitation-Admissions Coordinator   Pt's insurance has denied approval for CIR admit. Peer to peer completed with denial standing. AC will communicate this to family, CM, and SW. Call if questions.   Jhonnie Garner, OTR/L  Rehab Admissions Coordinator  [phone] 12/06/2017 3:43 PM

## 2017-12-06 NOTE — Progress Notes (Signed)
Physical Therapy Treatment Patient Details Name: [name] MRN: [id number] DOB: [birth year] Today's Date: 12/06/2017    History of Present Illness 84 y.o. male with medical history significant of hypertension, stroke, GERD, anxiety, BPH, third-degree AV block, pacemaker placement, OSA on CPAP, atrial fibrillation on Coumadin, transitional renal cell carcinoma (nephrectomy), dementia, CKD 3, who presents with fall and left hip pain X-ray in the office show left hip subcapital fracture slightly impacted. s/p direct anterior L THA.    PT Comments    Patient insistent on trialing his wife's Rollator for ambulation due to complaint that RW caused increased back pain and left leg pain. Patient with no increased unsteadiness noted with Rollator but due to its nonadjustable height and patient's decreased adherence to locking brakes for transfers, recommending continued use of RW for safety. Patient daughter verbalized understanding. Rest of session focused on lower extremity strengthening.    Follow Up Recommendations  Follow surgeon's recommendation for DC plan and follow-up therapies     Equipment Recommendations  3in1 (PT);Rolling walker with 5" wheels    Recommendations for Other Services       Precautions / Restrictions Precautions Precautions: Fall Precaution Comments: hip fx result of fall, impulsive  Restrictions Weight Bearing Restrictions: Yes LLE Weight Bearing: Weight bearing as tolerated    Mobility  Bed Mobility               General bed mobility comments: OOB in recliner  Transfers Overall transfer level: Needs assistance Equipment used: Rolling walker (2 wheeled);4-wheeled walker Transfers: Sit to/from Stand Sit to Stand: Min assist;+2 safety/equipment         General transfer comment: min A to boost up to standing. Max cues for hand placement  Ambulation/Gait Ambulation/Gait assistance: +2 safety/equipment;Min guard Ambulation Distance (Feet): 120  Feet Assistive device: Rolling walker (2 wheeled);4-wheeled walker Gait Pattern/deviations: Decreased step length - right;Decreased stance time - left;Decreased weight shift to left;Shuffle;Trunk flexed;Step-through pattern;Narrow base of support Gait velocity: slow   General Gait Details: Patient ambulating 100 feet with RW and cueing for improved foot clearance, wider BOS, and upright posture. Ambulated an additional 20 feet with Rollator; needed max verbal cueing for locking brakes/safety but no unsteadiness noted   Stairs             Wheelchair Mobility    Modified Rankin (Stroke Patients Only)       Balance Overall balance assessment: Needs assistance Sitting-balance support: Bilateral upper extremity supported;Feet supported Sitting balance-Leahy Scale: Good     Standing balance support: Bilateral upper extremity supported Standing balance-Leahy Scale: Poor                              Cognition Arousal/Alertness: Awake/alert Behavior During Therapy: Impulsive Overall Cognitive Status: Impaired/Different from baseline Area of Impairment: Following commands;Safety/judgement;Awareness;Problem solving                       Following Commands: Follows one step commands inconsistently;Follows multi-step commands inconsistently Safety/Judgement: Decreased awareness of safety Awareness: Intellectual Problem Solving: Slow processing;Difficulty sequencing;Requires verbal cues;Requires tactile cues General Comments: daughter says there are some memory problems at baseline, but he is more impulsive than normal      Exercises Total Joint Exercises Towel Squeeze: 20 reps;Both;Seated Hip ABduction/ADduction: Other (comment);20 reps;Seated(isometric ) Long Arc Quad: Seated;Both;20 reps Other Exercises Other Exercises: Seated hip flexion x 20 Other Exercises: Seated calf raises x 20    General  Comments        Pertinent Vitals/Pain Pain  Assessment: Faces Faces Pain Scale: Hurts little more Pain Location: left knee Pain Descriptors / Indicators: Guarding Pain Intervention(s): Monitored during session;Limited activity within patient's tolerance    Home Living                      Prior Function            PT Goals ([name]) Acute Rehab PT Goals Patient Stated Goal: to walk dog PT Goal Formulation: With patient/family Time For Goal Achievement: 12/19/17 Potential to Achieve Goals: Fair Progress towards PT goals: Progressing toward goals    Frequency    Min 5X/week      PT Plan Frequency needs to be updated    Co-evaluation PT/OT/SLP Co-Evaluation/Treatment: Yes            AM-PAC PT "6 Clicks" Daily Activity  Outcome Measure  Difficulty turning over in bed (including adjusting bedclothes, sheets and blankets)?: A Lot Difficulty moving from lying on back to sitting on the side of the bed? : Unable Difficulty sitting down on and standing up from a chair with arms (e.g., wheelchair, bedside commode, etc,.)?: Unable Help needed moving to and from a bed to chair (including a wheelchair)?: A Little Help needed walking in hospital room?: A Little Help needed climbing 3-5 steps with a railing? : A Lot 6 Click Score: 12    End of Session Equipment Utilized During Treatment: Gait belt Activity Tolerance: Patient tolerated treatment well Patient left: in chair;with call bell/phone within reach;with chair alarm set Nurse Communication: Mobility status PT Visit Diagnosis: Unsteadiness on feet (R26.81);Other abnormalities of gait and mobility (R26.89);Repeated falls (R29.6);History of falling (Z91.81);Muscle weakness (generalized) (M62.81);Difficulty in walking, not elsewhere classified (R26.2);Dizziness and giddiness (R42)     Time: 8938-1017 PT Time Calculation (min) (ACUTE ONLY): 32 min  Charges:  $Gait Training: 8-22 mins $Therapeutic Exercise:  8-22 mins                    G Codes:       [name], PT, DPT Acute Rehabilitation Services  Pager: [phone]    [name] 12/06/2017, 4:20 PM

## 2017-12-06 NOTE — Progress Notes (Addendum)
PROGRESS NOTE    [name]  EQA:[id number] DOB: Jun 18, 1930 DOA: 11/29/2017 PCP: [name], MD    Brief Narrative:84 year old married male, physically active and independent, PMH of complete heart block status post permanent pacemaker, last generator changed 2013 (Dr. Rayann Heman, [facility] cardiology), dementia, GERD, HTN, OSA on CPAP, permanent A. fib on Coumadin, transitional cell carcinoma status post right nephrectomy, stage III chronic kidney disease, stroke/TIA, frequent falls (3 this year) missed a step and fell on a concrete surface 5 days PTA leading to left hip pain and fracture, seen at orthopedic office and sent to the hospital for admission.  Orthopedics consulted.  Anticoagulation reversed.  Status post left total hip arthroplasty, anterior approach 6/3.  CIR consulted.   Assessment & Plan:   Principal Problem:   Closed left hip fracture (HCC) Active Problems:   Obstructive sleep apnea   FIBRILLATION, ATRIAL   Long term ([place]) use of anticoagulants   Pacemaker-Medtronic   Lower leg edema   Fall   Essential hypertension   Stroke (cerebrum) (HCC)   BPH (benign prostatic hyperplasia)   Dementia   CKD (chronic kidney disease), stage III (HCC)   Closed right hip fracture, initial encounter ([place])   Diarrhea   Closed displaced fracture of left femoral neck (HCC)   Status post total replacement of left hip   Benign essential HTN   History of CVA (cerebrovascular accident)   Cardiac pacemaker in situ   Postoperative pain   Acute blood loss anemia  1-Left hip fracture;  After a mechanical fall. Unable to have sx until 6-03 due to scheduling issues.  Hopefully CIR admission soon.   2-Acute blood loss anemia;  Post Op. ?  Surgical site no hematoma  Denies black stool. Will check for occult blood.  Will repeat hb this afternoon , if decreasing will consider getting CT abdomen.  Repeat labs in am/ Discussed with daughter, will proceed with one unit PRBC to help with  renal perfusion, and in case hb drop further, due to patient been on anticoagulation.   3-AKI on CKD stage III;  Status post right nephrectomy for TCC/stage III chronic kidney disease Prior admission cr 1.8. Cr 1.4--1.5.  Cr at 1.9 today. Suspect related to hypotension.  Will start IV fluids. Hold lasix.  Strict I and O.  Bladder scan.   4-Permanent  A fib;  On Lovenox, and coumadin.   Status post PPM for complete heart block Stable.  TTE 5/30 shows LVEF 60-65%.  EP Cardiology interrogated PPM prior to surgery and noted without problems.  Dementia; mild; stable.   LE edema;  Doppler negative for DVT   Hypotension; 6-04; hold lasix.  Received IV bolus.      DVT prophylaxis: Lovenox, coumadin.  Code Status: full code.  Family Communication: daughter at bedside.  Disposition Plan: Hopefully CIR>   Consultants:   Ortho    Procedures:  Left total hip arthroplasty, anterior approach 6-03     Antimicrobials:  none  Subjective: He is sitting in recliner. He had an accident in the bed this am, had BM, brown color.  He denies dyspnea.  He was able to ambulate this   Objective: Vitals:   12/05/17 1445 12/05/17 2100 12/05/17 2254 12/06/17 0500  BP: (!) 85/53 (!) 106/52  (!) 114/57  Pulse:  (!) 59 60 66  Resp:  13 18 15   Temp:  98.9 F (37.2 C)  98.4 F (36.9 C)  TempSrc:  Oral  Oral  SpO2:  99% 100%  98%  Weight:      Height:        Intake/Output Summary (Last 24 hours) at 12/06/2017 1424 Last data filed at 12/06/2017 0700 Gross per 24 hour  Intake 980 ml  Output 750 ml  Net 230 ml   Filed Weights   11/30/17 1554 12/04/17 1919  Weight: 81.6 kg (180 lb) 81.6 kg (180 lb)    Examination:  General exam: Appears calm and comfortable  Respiratory system: Clear to auscultation. Respiratory effort normal. Cardiovascular system: S1 & S2 heard, RRR. No JVD, murmurs, rubs, gallops or clicks. No pedal edema. Gastrointestinal system: Abdomen is nondistended, soft  and nontender. No organomegaly or masses felt. Normal bowel sounds heard. Central nervous system: Alert and oriented. No focal neurological deficits. Extremities: Symmetric 5 x 5 power. Skin: No rashes, lesions or ulcers Psychiatry: Judgement and insight appear normal. Mood & affect appropriate.     Data Reviewed: I have personally reviewed following labs and imaging studies  CBC: Recent Labs  Lab 11/29/17 2003  12/02/17 1021 12/03/17 0334 12/04/17 0435 12/05/17 1826 12/06/17 0337  WBC 4.8   < > 4.1 4.8 4.9 9.4 7.0  NEUTROABS 2.7  --   --   --   --   --   --   HGB 11.5*   < > 11.9* 11.1* 11.1* 9.0* 8.0*  HCT 37.2*   < > 37.7* 34.7* 34.8* 28.5* 25.1*  MCV 101.9*   < > 100.0 99.7 99.1 100.4* 99.2  PLT 177   < > 190 203 211 211 170   < > = values in this interval not displayed.   Basic Metabolic Panel: Recent Labs  Lab 12/02/17 1021 12/03/17 0334 12/04/17 0435 12/05/17 1826 12/06/17 0337  NA 140 142 140 137 136  K 4.4 4.0 4.3 4.8 4.3  CL 106 108 112* 104 108  CO2 28 28 25 25 23   GLUCOSE 109* 127* 112* 172* 128*  BUN 26* 32* 27* 40* 42*  CREATININE 1.54* 1.64* 1.38* 1.93* 1.90*  CALCIUM 9.2 9.1 8.8* 9.0 8.5*   GFR: Estimated Creatinine Clearance: 28.8 mL/min (A) (by C-G formula based on SCr of 1.9 mg/dL (H)). Liver Function Tests: Recent Labs  Lab 11/29/17 2003  AST 21  ALT 16*  ALKPHOS 102  BILITOT 0.9  PROT 6.8  ALBUMIN 3.6   No results for input(s): LIPASE, AMYLASE in the last 168 hours. No results for input(s): AMMONIA in the last 168 hours. Coagulation Profile: Recent Labs  Lab 12/01/17 1234 12/02/17 1021 12/03/17 0334 12/05/17 1332 12/06/17 0337  INR 1.66 1.31 1.27 1.32 1.61   Cardiac Enzymes: No results for input(s): CKTOTAL, CKMB, CKMBINDEX, TROPONINI in the last 168 hours. BNP (last 3 results) No results for input(s): PROBNP in the last 8760 hours. HbA1C: No results for input(s): HGBA1C in the last 72 hours. CBG: Recent Labs  Lab  12/06/17 1220  GLUCAP 136*   Lipid Profile: No results for input(s): CHOL, HDL, LDLCALC, TRIG, CHOLHDL, LDLDIRECT in the last 72 hours. Thyroid Function Tests: No results for input(s): TSH, T4TOTAL, FREET4, T3FREE, THYROIDAB in the last 72 hours. Anemia Panel: No results for input(s): VITAMINB12, FOLATE, FERRITIN, TIBC, IRON, RETICCTPCT in the last 72 hours. Sepsis Labs: No results for input(s): PROCALCITON, LATICACIDVEN in the last 168 hours.  Recent Results (from the past 240 hour(s))  Surgical pcr screen     Status: None   Collection Time: 11/30/17  2:48 AM  Result Value Ref Range Status   MRSA,  PCR NEGATIVE NEGATIVE Final   Staphylococcus aureus NEGATIVE NEGATIVE Final    Comment: (NOTE) The Xpert SA Assay (FDA approved for NASAL specimens in patients 60 years of age and older), is one component of a comprehensive surveillance program. It is not intended to diagnose infection nor to guide or monitor treatment. Performed at [facility], [place] [address] [place], [place] [address]          Radiology Studies: Pelvis Portable  Result Date: 12/05/2017 CLINICAL DATA:  Left total hip replacement. EXAM: PORTABLE PELVIS 1-2 VIEWS COMPARISON:  Left hip x-rays dated Nov 29, 2017. FINDINGS: The left hip demonstrates a total arthroplasty without evidence of hardware failure or complication. Single cerclage wire around the greater trochanter. There is expected intra-articular air. There is no fracture or dislocation. The alignment is anatomic. Post-surgical changes noted in the surrounding soft tissues. The right hip is unremarkable. IMPRESSION: 1. Interval left total hip arthroplasty without evidence of acute postoperative complication. Electronically Signed   By: Titus Dubin M.D.   On: 12/05/2017 00:11   Dg C-arm 1-60 Min  Result Date: 12/05/2017 CLINICAL DATA:  Left hip arthroplasty. EXAM: OPERATIVE LEFT HIP (WITH PELVIS IF PERFORMED) 3 VIEWS TECHNIQUE: Fluoroscopic spot  image(s) were submitted for interpretation post-operatively. COMPARISON:  Left hip x-rays dated Nov 29, 2017. FINDINGS: Intraoperative x-rays demonstrating interval left total hip arthroplasty with single cerclage wire around the greater trochanter. Components are well aligned. IMPRESSION: Interval left total hip arthroplasty. FLUOROSCOPY TIME:  28 seconds. C-arm fluoroscopic images were obtained intraoperatively and submitted for post operative interpretation. Electronically Signed   By: Titus Dubin M.D.   On: 12/05/2017 00:10   Dg C-arm 1-60 Min  Result Date: 12/05/2017 CLINICAL DATA:  Left hip arthroplasty. EXAM: OPERATIVE LEFT HIP (WITH PELVIS IF PERFORMED) 3 VIEWS TECHNIQUE: Fluoroscopic spot image(s) were submitted for interpretation post-operatively. COMPARISON:  Left hip x-rays dated Nov 29, 2017. FINDINGS: Intraoperative x-rays demonstrating interval left total hip arthroplasty with single cerclage wire around the greater trochanter. Components are well aligned. IMPRESSION: Interval left total hip arthroplasty. FLUOROSCOPY TIME:  28 seconds. C-arm fluoroscopic images were obtained intraoperatively and submitted for post operative interpretation. Electronically Signed   By: Titus Dubin M.D.   On: 12/05/2017 00:10   Dg Hip Operative Unilat With Pelvis Left  Result Date: 12/05/2017 CLINICAL DATA:  Left hip arthroplasty. EXAM: OPERATIVE LEFT HIP (WITH PELVIS IF PERFORMED) 3 VIEWS TECHNIQUE: Fluoroscopic spot image(s) were submitted for interpretation post-operatively. COMPARISON:  Left hip x-rays dated Nov 29, 2017. FINDINGS: Intraoperative x-rays demonstrating interval left total hip arthroplasty with single cerclage wire around the greater trochanter. Components are well aligned. IMPRESSION: Interval left total hip arthroplasty. FLUOROSCOPY TIME:  28 seconds. C-arm fluoroscopic images were obtained intraoperatively and submitted for post operative interpretation. Electronically Signed   By:  Titus Dubin M.D.   On: 12/05/2017 00:10        Scheduled Meds: . aspirin  81 mg Oral BH-q7a  . clonazePAM  2 mg Oral QHS  . docusate sodium  100 mg Oral BID  . doxazosin  2 mg Oral Daily  . enoxaparin (LOVENOX) injection  80 mg Subcutaneous QHS  . finasteride  5 mg Oral Daily  . galantamine  8 mg Oral BID  . hydrocortisone cream   Topical BID  . ipratropium  2 spray Each Nare BID  . metoprolol tartrate  25 mg Oral BID  . multivitamin with minerals  1 tablet Oral Daily  . warfarin  5 mg Oral ONCE-1800  . Warfarin - Pharmacist Dosing Inpatient   Does not apply q1800   Continuous Infusions: . sodium chloride    . lactated ringers 10 mL/hr at 12/04/17 1928     LOS: 7 days    Time spent:35 minutes.     Elmarie Shiley, MD [facility] Pager [phone]  If 7PM-7AM, please contact night-coverage [email or web address] Password [email or web address] 12/06/2017, 2:24 PM

## 2017-12-06 NOTE — Progress Notes (Signed)
ANTICOAGULATION CONSULT NOTE  Pharmacy Consult:  Lovenox / Coumadin Indication: atrial fibrillation  No Known Allergies  Patient Measurements: Height: 5\' 10"  (177.8 cm) Weight: 180 lb (81.6 kg) IBW/kg (Calculated) : 73  Vital Signs: Temp: 98.4 F (36.9 C) (06/05 0500) Temp Source: Oral (06/05 0500) BP: 114/57 (06/05 0500) Pulse Rate: 66 (06/05 0500)  Labs: Recent Labs    12/03/17 1334 12/03/17 2241  12/04/17 0435 12/05/17 1332 12/05/17 1826 12/06/17 0337  HGB  --   --    < > 11.1*  --  9.0* 8.0*  HCT  --   --   --  34.8*  --  28.5* 25.1*  PLT  --   --   --  211  --  211 170  LABPROT  --   --   --   --  16.3*  --  19.0*  INR  --   --   --   --  1.32  --  1.61  HEPARINUNFRC 0.19* 0.35  --   --   --   --   --   CREATININE  --   --   --  1.38*  --  1.93* 1.90*   < > = values in this interval not displayed.    Estimated Creatinine Clearance: 28.8 mL/min (A) (by C-G formula based on SCr of 1.9 mg/dL (H)).   Assessment: 88 YOM on Coumadin PTA for Afib (CHADsVASc = 5).  He received Vitamin K x3 doses and then started on IV heparin bridge.  Now s/p hip surgery on 12/04/17 and patient to resume Coumadin with full-dose Lovenox bridge.  INR sub-therapeutic and trending up.  Renal function is fluctuating, currently on the rise with CrCL < 30 ml/min.  No bleeding reported.  Home Coumadin dose:  5mg  PO daily except 2.5mg  on Monday.  MD requested to resume home regimen, no loading.   Goal of Therapy:  INR 2 - 3 Anti-Xa level 0.6-1 units/mL 4hrs after LMWH dose Monitor platelets by anticoagulation protocol: Yes    Plan:  Coumadin 5mg  PO today Lovenox 80mg  SQ Q24H Daily PT / INR CBC daily x14 occurrences per MD   Remigio Eisenmenger D. Mina [place], PharmD, BCPS, BCCCP Pager:  [phone] 12/06/2017, 7:53 AM

## 2017-12-06 NOTE — Progress Notes (Signed)
CPAP within reach at beside.  Patients family places patient on and off CPAP.  RT assistance not needed at this time.

## 2017-12-06 NOTE — Progress Notes (Signed)
    Subjective:  Patient reports pain as mild to moderate.  Denies N/V/CP/SOB. No c/o.  Objective:   VITALS:   Vitals:   12/05/17 1445 12/05/17 2100 12/05/17 2254 12/06/17 0500  BP: (!) 85/53 (!) 106/52  (!) 114/57  Pulse:  (!) 59 60 66  Resp:  13 18 15   Temp:  98.9 F (37.2 C)  98.4 F (36.9 C)  TempSrc:  Oral  Oral  SpO2:  99% 100% 98%  Weight:      Height:        NAD ABD soft Intact pulses distally Dorsiflexion/Plantar flexion intact Incision: dressing C/D/I Compartment soft Prevena intact  Lab Results  Component Value Date   WBC 7.0 12/06/2017   HGB 8.0 (L) 12/06/2017   HCT 25.1 (L) 12/06/2017   MCV 99.2 12/06/2017   PLT 170 12/06/2017   BMET    Component Value Date/Time   NA 136 12/06/2017 0337   K 4.3 12/06/2017 0337   CL 108 12/06/2017 0337   CO2 23 12/06/2017 0337   GLUCOSE 128 (H) 12/06/2017 0337   BUN 42 (H) 12/06/2017 0337   CREATININE 1.90 (H) 12/06/2017 0337   CALCIUM 8.5 (L) 12/06/2017 0337   GFRNONAA 30 (L) 12/06/2017 0337   GFRAA 35 (L) 12/06/2017 0337   INR 1.6  Assessment/Plan: 2 Days Post-Op   Principal Problem:   Closed left hip fracture (HCC) Active Problems:   Obstructive sleep apnea   FIBRILLATION, ATRIAL   Long term ([place]) use of anticoagulants   Pacemaker-Medtronic   Lower leg edema   Fall   Essential hypertension   Stroke (cerebrum) (HCC)   BPH (benign prostatic hyperplasia)   Dementia   CKD (chronic kidney disease), stage III (HCC)   Closed right hip fracture, initial encounter ([place])   Diarrhea   Closed displaced fracture of left femoral neck (HCC)   Status post total replacement of left hip   Benign essential HTN   History of CVA (cerebrovascular accident)   Cardiac pacemaker in situ   Postoperative pain   Acute blood loss anemia   WBAT with walker DVT ppx: Lovenox and Coumadin (D/C lovenox when INR > 1.8), SCDs, TEDS PO pain control PT/OT - 2 person assist Dispo: D/C to SNF vs CIR if no one  available to assist, plan to convert prevena to portable home unit just prior to d/c   Bertram Savin 12/06/2017, 12:37 PM   Rod Can, MD Cell [phone]

## 2017-12-06 NOTE — Progress Notes (Signed)
Inpatient Rehabilitation-Admissions Coordinator   Spoke with pt and daughter in the room. Pt had just worked with PT this AM and was up in recliner. Noted CIR recommendations from both OT and PT. AC is still awaiting insurance authorization, with hopes for decision today. Will follow up once authorization decision returns. Please call if questions.   Jhonnie Garner, OTR/L  Rehab Admissions Coordinator  [phone] 12/06/2017 11:54 AM

## 2017-12-06 NOTE — Progress Notes (Signed)
Physical Therapy Treatment Patient Details Name: [name] MRN: [id number] DOB: [birth year] Today's Date: 12/06/2017    History of Present Illness 83 y.o. male with medical history significant of hypertension, stroke, GERD, anxiety, BPH, third-degree AV block, pacemaker placement, OSA on CPAP, atrial fibrillation on Coumadin, transitional renal cell carcinoma (nephrectomy), dementia, CKD 3, who presents with fall and left hip pain X-ray in the office show left hip subcapital fracture slightly impacted. s/p direct anterior L THA.    PT Comments    Patient with complaint of increased left knee pain this session but still able to participate fully in therapy. Continues to present with gross weakness of left lower extremity; he is unable to perform a straight leg raise or hip abduction. Ambulating 100 feet with RW and two person min assist for safety and balance (chair follow utilized). Remains appropriate candidate for CIR in order to maximize functional independence and decrease caregiver burden.   Follow Up Recommendations  Follow surgeon's recommendation for DC plan and follow-up therapies     Equipment Recommendations  3in1 (PT);Rolling walker with 5" wheels    Recommendations for Other Services       Precautions / Restrictions Precautions Precautions: Fall Precaution Comments: hip fx result of fall, impulsive  Restrictions Weight Bearing Restrictions: Yes LLE Weight Bearing: Weight bearing as tolerated    Mobility  Bed Mobility Overal bed mobility: Needs Assistance Bed Mobility: Supine to Sit     Supine to sit: Min assist     General bed mobility comments: min assist with LLE management with increased time and verbal cueing required to scoot hips forward to EOB  Transfers Overall transfer level: Needs assistance Equipment used: Rolling walker (2 wheeled) Transfers: Sit to/from Stand Sit to Stand: Min assist;+2 safety/equipment         General transfer  comment: min A to steady, max cues for hand placement.   Ambulation/Gait Ambulation/Gait assistance: +2 safety/equipment;Min assist Ambulation Distance (Feet): 100 Feet Assistive device: Rolling walker (2 wheeled) Gait Pattern/deviations: Decreased step length - right;Decreased stance time - left;Decreased weight shift to left;Shuffle;Trunk flexed;Step-through pattern;Narrow base of support Gait velocity: slow Gait velocity interpretation: <1.31 ft/sec, indicative of household ambulator General Gait Details: minA for steadying with gait, pt with improved proximity to the RW this session but requires max cues for upright posture. Chair follow required.   Stairs             Wheelchair Mobility    Modified Rankin (Stroke Patients Only)       Balance Overall balance assessment: Needs assistance Sitting-balance support: Bilateral upper extremity supported;Feet supported Sitting balance-Leahy Scale: Good     Standing balance support: Bilateral upper extremity supported Standing balance-Leahy Scale: Poor                              Cognition Arousal/Alertness: Awake/alert Behavior During Therapy: Impulsive Overall Cognitive Status: Impaired/Different from baseline Area of Impairment: Following commands;Safety/judgement;Awareness;Problem solving                       Following Commands: Follows one step commands inconsistently;Follows multi-step commands inconsistently Safety/Judgement: Decreased awareness of safety Awareness: Intellectual Problem Solving: Slow processing;Difficulty sequencing;Requires verbal cues;Requires tactile cues General Comments: daughter says there are some memory problems at baseline, but he is more impulsive than normal      Exercises Total Joint Exercises Quad Sets: 10 reps;Left;Supine Long [employer]: 10 reps;Left;Seated  General Comments General comments (skin integrity, edema, etc.): VSS      Pertinent  Vitals/Pain Pain Assessment: Faces Faces Pain Scale: Hurts even more Pain Location: left knee Pain Descriptors / Indicators: Guarding Pain Intervention(s): Limited activity within patient's tolerance;Monitored during session;Premedicated before session    Home Living                      Prior Function            PT Goals ([place] goals can now be found in the care plan section) Acute Rehab PT Goals Patient Stated Goal: to walk dog PT Goal Formulation: With patient/family Time For Goal Achievement: 12/19/17 Potential to Achieve Goals: Fair Progress towards PT goals: Progressing toward goals    Frequency    Min 5X/week      PT Plan Frequency needs to be updated    Co-evaluation PT/OT/SLP Co-Evaluation/Treatment: Yes            AM-PAC PT "6 Clicks" Daily Activity  Outcome Measure  Difficulty turning over in bed (including adjusting bedclothes, sheets and blankets)?: A Lot Difficulty moving from lying on back to sitting on the side of the bed? : Unable Difficulty sitting down on and standing up from a chair with arms (e.g., wheelchair, bedside commode, etc,.)?: Unable Help needed moving to and from a bed to chair (including a wheelchair)?: A Little Help needed walking in hospital room?: A Little Help needed climbing 3-5 steps with a railing? : A Lot 6 Click Score: 12    End of Session Equipment Utilized During Treatment: Gait belt Activity Tolerance: Patient tolerated treatment well Patient left: in chair;with call bell/phone within reach;with chair alarm set Nurse Communication: Mobility status PT Visit Diagnosis: Unsteadiness on feet (R26.81);Other abnormalities of gait and mobility (R26.89);Repeated falls (R29.6);History of falling (Z91.81);Muscle weakness (generalized) (M62.81);Difficulty in walking, not elsewhere classified (R26.2);Dizziness and giddiness (R42)     Time: 0092-3300 PT Time Calculation (min) (ACUTE ONLY): 24 min  Charges:  $Gait  Training: 23-37 mins                    G Codes:      Ellamae Sia, PT, DPT Acute Rehabilitation Services  Pager: [phone]   Willy Eddy 12/06/2017, 9:39 AM

## 2017-12-07 ENCOUNTER — Inpatient Hospital Stay (HOSPITAL_COMMUNITY): Payer: PPO

## 2017-12-07 LAB — CBC
HCT: 22.9 % — ABNORMAL LOW (ref 39.0–52.0)
HCT: 26.4 % — ABNORMAL LOW (ref 39.0–52.0)
Hemoglobin: 7.3 g/dL — ABNORMAL LOW (ref 13.0–17.0)
Hemoglobin: 8.2 g/dL — ABNORMAL LOW (ref 13.0–17.0)
MCH: 31.2 pg (ref 26.0–34.0)
MCH: 30.7 pg (ref 26.0–34.0)
MCHC: 31.9 g/dL (ref 30.0–36.0)
MCHC: 31.1 g/dL (ref 30.0–36.0)
MCV: 97.9 fL (ref 78.0–100.0)
MCV: 98.9 fL (ref 78.0–100.0)
PLATELETS: 146 10*3/uL — AB (ref 150–400)
PLATELETS: 190 10*3/uL (ref 150–400)
RBC: 2.34 MIL/uL — ABNORMAL LOW (ref 4.22–5.81)
RBC: 2.67 MIL/uL — ABNORMAL LOW (ref 4.22–5.81)
RDW: 15.1 % (ref 11.5–15.5)
RDW: 15.5 % (ref 11.5–15.5)
WBC: 7.2 10*3/uL (ref 4.0–10.5)
WBC: 8.8 10*3/uL (ref 4.0–10.5)

## 2017-12-07 LAB — PREPARE RBC (CROSSMATCH)

## 2017-12-07 LAB — BASIC METABOLIC PANEL
Anion gap: 7 (ref 5–15)
BUN: 43 mg/dL — ABNORMAL HIGH (ref 6–20)
CALCIUM: 8.5 mg/dL — AB (ref 8.9–10.3)
CO2: 23 mmol/L (ref 22–32)
CREATININE: 1.76 mg/dL — AB (ref 0.61–1.24)
Chloride: 108 mmol/L (ref 101–111)
GFR calc Af Amer: 39 mL/min — ABNORMAL LOW (ref >60)
GFR calc non Af Amer: 33 mL/min — ABNORMAL LOW (ref >60)
Glucose, Bld: 117 mg/dL — ABNORMAL HIGH (ref 65–99)
Potassium: 4.4 mmol/L (ref 3.5–5.1)
Sodium: 138 mmol/L (ref 135–145)

## 2017-12-07 LAB — PROTIME-INR
INR: 1.74
PROTHROMBIN TIME: 20.2 s — AB (ref 11.4–15.2)

## 2017-12-07 MED ORDER — WARFARIN SODIUM 5 MG PO TABS: 5.0000 mg | ORAL_TABLET | Freq: Once | ORAL | Status: DC

## 2017-12-07 MED ORDER — SENNA 8.6 MG PO TABS
1.0000 | ORAL_TABLET | Freq: Every day | ORAL | Status: DC
  Administered 2017-12-07: 8.6 mg via ORAL
  Filled 2017-12-07: qty 1

## 2017-12-07 MED ORDER — SODIUM CHLORIDE 0.9 % IV SOLN: Freq: Once | INTRAVENOUS | Status: DC

## 2017-12-07 MED ORDER — WARFARIN SODIUM 5 MG PO TABS
5.0000 mg | ORAL_TABLET | Freq: Once | ORAL | Status: AC
  Administered 2017-12-07: 5 mg via ORAL
  Filled 2017-12-07: qty 1

## 2017-12-07 MED ORDER — POLYETHYLENE GLYCOL 3350 17 G PO PACK
17.0000 g | PACK | Freq: Two times a day (BID) | ORAL | Status: DC
  Administered 2017-12-07 – 2017-12-09 (×5): 17 g via ORAL
  Filled 2017-12-07 (×5): qty 1

## 2017-12-07 MED ORDER — WARFARIN - PHARMACIST DOSING INPATIENT
Freq: Every day | Status: DC
  Administered 2017-12-08: 1

## 2017-12-07 MED ORDER — PANTOPRAZOLE SODIUM 40 MG PO TBEC
40.0000 mg | DELAYED_RELEASE_TABLET | Freq: Two times a day (BID) | ORAL | Status: DC
  Administered 2017-12-07 – 2017-12-09 (×4): 40 mg via ORAL
  Filled 2017-12-07 (×4): qty 1

## 2017-12-07 MED ORDER — SENNA 8.6 MG PO TABS
1.0000 | ORAL_TABLET | Freq: Two times a day (BID) | ORAL | Status: DC
  Administered 2017-12-07 – 2017-12-09 (×4): 8.6 mg via ORAL
  Filled 2017-12-07 (×4): qty 1

## 2017-12-07 MED ORDER — BISACODYL 5 MG PO TBEC
5.0000 mg | DELAYED_RELEASE_TABLET | Freq: Every day | ORAL | Status: DC | PRN
  Filled 2017-12-07: qty 1

## 2017-12-07 NOTE — Progress Notes (Addendum)
PROGRESS NOTE    [name]  BOF:[id number] DOB: 11/04/1929 DOA: 11/29/2017 PCP: [name], MD    Brief Narrative:84 year old married male, physically active and independent, PMH of complete heart block status post permanent pacemaker, last generator changed 2013 (Dr. Rayann Heman, EP cardiology), dementia, GERD, HTN, OSA on CPAP, permanent A. fib on Coumadin, transitional cell carcinoma status post right nephrectomy, stage III chronic kidney disease, stroke/TIA, frequent falls (3 this year) missed a step and fell on a concrete surface 5 days PTA leading to left hip pain and fracture, seen at orthopedic office and sent to the hospital for admission.  Orthopedics consulted.  Anticoagulation reversed.  Status post left total hip arthroplasty, anterior approach 6/3.  CIR consulted.   Assessment & Plan:   Principal Problem:   Closed left hip fracture (HCC) Active Problems:   Obstructive sleep apnea   FIBRILLATION, ATRIAL   Long term ([place]) use of anticoagulants   Pacemaker-[employer]   Lower leg edema   Fall   Essential hypertension   Stroke (cerebrum) (HCC)   BPH (benign prostatic hyperplasia)   Dementia   CKD (chronic kidney disease), stage III (HCC)   Closed right hip fracture, initial encounter ([place])   Diarrhea   Closed displaced fracture of left femoral neck (HCC)   Status post total replacement of left hip   Benign essential HTN   History of CVA (cerebrovascular accident)   Cardiac pacemaker in situ   Postoperative pain   Acute blood loss anemia  1-Left hip fracture;  After a mechanical fall. Unable to have sx until 6-03 due to scheduling issues.  Insurance denial for CIR> .   2-Acute blood loss anemia;  Post Op. ?  Surgical site no hematoma  Denies black stool. Will check for occult blood.  CT abdomen pelvis negative for retroperitoneal bleed.  received one unit PRBC, hb didn't increase significantly;y. Will transfuse another unit.  Hold Lovenox,  GI  consulted.  Will discussed with Dr Michail Sermon, regarding anticoagulation. Discussed with Dr Michail Sermon, ok to resume coumadin.   3-AKI on CKD stage III;  Status post right nephrectomy for TCC/stage III chronic kidney disease Prior admission cr 1.8. Cr 1.4--1.5.  Cr at 1.9 today. Suspect related to hypotension.   Hold lasix.  Strict I and O.  Continue with IV fluids.  Cr decreased.   4-Permanent  A fib;  Holding Lovenox.  Resume coumadin, discussed with Dr Michail Sermon.   Status post PPM for complete heart block Stable.  TTE 5/30 shows LVEF 60-65%.  EP Cardiology interrogated PPM prior to surgery and noted without problems.  Dementia; mild; stable.   LE edema;  Doppler negative for DVT   Hypotension; 6-04; hold lasix.  Received IV bolus.  Improved. Hold cardura.     DVT prophylaxis: Lovenox, coumadin.  Code Status: full code.  Family Communication: Wife at bedside.  Disposition Plan: he will need SNF  Consultants:   Ortho    Procedures:  Left total hip arthroplasty, anterior approach 6-03     Antimicrobials:  none  Subjective: He is tired this am. No BM yet.  Pain left hip.    Objective: Vitals:   12/06/17 2145 12/06/17 2225 12/07/17 0051 12/07/17 0341  BP: (!) 124/53 (!) 114/55 116/64 110/61  Pulse: 79 70 70 70  Resp:   13 16  Temp: 98 F (36.7 C) 99.1 F (37.3 C)    TempSrc: Oral Oral    SpO2: 100% 100% 99% 99%  Weight:  Height:        Intake/Output Summary (Last 24 hours) at 12/07/2017 0955 Last data filed at 12/06/2017 2145 Gross per 24 hour  Intake 914.42 ml  Output -  Net 914.42 ml   Filed Weights   11/30/17 1554 12/04/17 1919  Weight: 81.6 kg (180 lb) 81.6 kg (180 lb)    Examination:  General exam: NAD Respiratory system: CTA Cardiovascular system: S 1, S 2 RRR Gastrointestinal system: BS present, soft, nt Central nervous system: non focal.  Extremities: edema left thigh , wound vac, Skin: No rashes, lesions or ulcers Psychiatry:  Judgement and insight appear normal. Mood & affect appropriate.     Data Reviewed: I have personally reviewed following labs and imaging studies  CBC: Recent Labs  Lab 12/04/17 0435 12/05/17 1826 12/06/17 0337 12/06/17 1433 12/07/17 0314 12/07/17 0711  WBC 4.9 9.4 7.0  --  7.2 8.8  HGB 11.1* 9.0* 8.0* 8.4* 7.3* 8.2*  HCT 34.8* 28.5* 25.1* 26.3* 22.9* 26.4*  MCV 99.1 100.4* 99.2  --  97.9 98.9  PLT 211 211 170  --  146* 295   Basic Metabolic Panel: Recent Labs  Lab 12/03/17 0334 12/04/17 0435 12/05/17 1826 12/06/17 0337 12/07/17 0314  NA 142 140 137 136 138  K 4.0 4.3 4.8 4.3 4.4  CL 108 112* 104 108 108  CO2 28 25 25 23 23   GLUCOSE 127* 112* 172* 128* 117*  BUN 32* 27* 40* 42* 43*  CREATININE 1.64* 1.38* 1.93* 1.90* 1.76*  CALCIUM 9.1 8.8* 9.0 8.5* 8.5*   GFR: Estimated Creatinine Clearance: 31.1 mL/min (A) (by C-G formula based on SCr of 1.76 mg/dL (H)). Liver Function Tests: No results for input(s): AST, ALT, ALKPHOS, BILITOT, PROT, ALBUMIN in the last 168 hours. No results for input(s): LIPASE, AMYLASE in the last 168 hours. No results for input(s): AMMONIA in the last 168 hours. Coagulation Profile: Recent Labs  Lab 12/02/17 1021 12/03/17 0334 12/05/17 1332 12/06/17 0337 12/07/17 0314  INR 1.31 1.27 1.32 1.61 1.74   Cardiac Enzymes: No results for input(s): CKTOTAL, CKMB, CKMBINDEX, TROPONINI in the last 168 hours. BNP (last 3 results) No results for input(s): PROBNP in the last 8760 hours. HbA1C: No results for input(s): HGBA1C in the last 72 hours. CBG: Recent Labs  Lab 12/06/17 1220  GLUCAP 136*   Lipid Profile: No results for input(s): CHOL, HDL, LDLCALC, TRIG, CHOLHDL, LDLDIRECT in the last 72 hours. Thyroid Function Tests: No results for input(s): TSH, T4TOTAL, FREET4, T3FREE, THYROIDAB in the last 72 hours. Anemia Panel: No results for input(s): VITAMINB12, FOLATE, FERRITIN, TIBC, IRON, RETICCTPCT in the last 72 hours. Sepsis  Labs: No results for input(s): PROCALCITON, LATICACIDVEN in the last 168 hours.  Recent Results (from the past 240 hour(s))  Surgical pcr screen     Status: None   Collection Time: 11/30/17  2:48 AM  Result Value Ref Range Status   MRSA, PCR NEGATIVE NEGATIVE Final   Staphylococcus aureus NEGATIVE NEGATIVE Final    Comment: (NOTE) The Xpert SA Assay (FDA approved for NASAL specimens in patients 80 years of age and older), is one component of a comprehensive surveillance program. It is not intended to diagnose infection nor to guide or monitor treatment. Performed at [facility], [place] [address], [place], [place] [address]          Radiology Studies: No results found.      Scheduled Meds: . clonazePAM  2 mg Oral QHS  . docusate sodium  100 mg Oral BID  . finasteride  5 mg Oral Daily  . galantamine  8 mg Oral BID  . hydrocortisone cream   Topical BID  . ipratropium  2 spray Each Nare BID  . metoprolol tartrate  25 mg Oral BID  . multivitamin with minerals  1 tablet Oral Daily  . polyethylene glycol  17 g Oral BID  . senna  1 tablet Oral Daily  . warfarin  5 mg Oral ONCE-1800  . Warfarin - Pharmacist Dosing Inpatient   Does not apply q1800   Continuous Infusions: . sodium chloride 75 mL/hr at 12/06/17 1455  . sodium chloride    . lactated ringers 10 mL/hr at 12/04/17 1928     LOS: 8 days    Time spent:35 minutes.     Elmarie Shiley, MD [facility] Pager [phone]  If 7PM-7AM, please contact night-coverage [email or web address] Password [email or web address] 12/07/2017, 9:55 AM

## 2017-12-07 NOTE — Progress Notes (Signed)
Patient has family that has placed him on CPAP and he is doing well. Will call if any further assistance needed.

## 2017-12-07 NOTE — Clinical Social Work Note (Signed)
Clinical Social Work Assessment  Patient Details  Name: [name] MRN: [id number] Date of Birth: 22-Mar-1930  Date of referral:  12/07/17               Reason for consult:  Facility Placement                Permission sought to share information with:  Facility Art therapist granted to share information::     Name::     Tax adviser::  SNF  Relationship::  spouse  Contact Information:     Housing/Transportation Living arrangements for the past 2 months:  Single Family Home Source of Information:  Spouse Patient Interpreter Needed:  None Criminal Activity/Legal Involvement Pertinent to [place] Situation/Hospitalization:  No - Comment as needed Significant Relationships:  Adult Children, Other Family Members, Spouse Lives with:  Spouse Do you feel safe going back to the place where you live?  No Need for family participation in patient care:  Yes (Comment)  Care giving concerns:  Pt from home with spouse and will need skilled nursing at discharge as he was not approved for IP rehab.  Social Worker assessment / plan:  CSW met with patient and spouse at bedside. Spouse was asleep and spouse engaged with CSW about disposition.  Spouse indicated that patient was independent with all ADL's prior to hospitalization and participated in acitivities at [facility] weekly. Pt has never been to SNF.  CSW explained SNF process, placement and insurance auth. CSW obtained permission to send to SNF's in [employer]  CSW will f/u for disposition.  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  Patient/Family's Understanding of and Emotional Response to Diagnosis, [place] Treatment, and Prognosis:  Spouse has good understanding of diagnosis and agreeable to SNF.  She  does not know what SNF she would like for patient but hopes it is one that can manage his coumadin. Spouse hopes that rehabilitative therapies will assit with improving patient's condition and return him to his baseline.  CSW will f/u for disposition.  Emotional Assessment Appearance:  Appears stated age Attitude/Demeanor/Rapport:  Unable to Assess(Patient resting) Affect (typically observed):  (Resting) Orientation:  Oriented to Self, Oriented to Place, Oriented to  Time, Oriented to Situation Alcohol / Substance use:  Not Applicable Psych involvement ([place] and /or in the community):  No (Comment)  Discharge Needs  Concerns to be addressed:  Discharge Planning Concerns Readmission within the last 30 days:  No [place] discharge risk:  Dependent with Mobility, Physical Impairment Barriers to Discharge:  Continued Medical Work up   [employer], LCSW 12/07/2017, 10:59 AM

## 2017-12-07 NOTE — Progress Notes (Signed)
Orthopedic Tech Progress Note Patient Details:  [name] [birth year] [id number]  Ortho Devices Ortho Device/Splint Location: Trapeze bar Ortho Device/Splint Interventions: Application   Post Interventions Patient Tolerated: Well Instructions Provided: Care of device, Adjustment of device   [place] Pink 12/07/2017, 12:49 PM

## 2017-12-07 NOTE — Progress Notes (Signed)
Physical Therapy Treatment Patient Details Name: [name] MRN: [id number] DOB: December 17, 1929 Today's Date: 12/07/2017    History of Present Illness 84 y.o. male with medical history significant of hypertension, stroke, GERD, anxiety, BPH, third-degree AV block, pacemaker placement, OSA on CPAP, atrial fibrillation on Coumadin, transitional renal cell carcinoma (nephrectomy), dementia, CKD 3, who presents with fall and left hip pain X-ray in the office show left hip subcapital fracture slightly impacted. s/p direct anterior L THA.    PT Comments    On arrival to room, pt up in chair and refusing to ambulate. Pt educated on benefits of mobilizing and impact on healing, however pt continuing to refuse, stating, he wants to use the rollator instead of RW. Advised pt that previous therapist evaluated pt for rollator and deemed RW the safest option for now. Pt agreeable to perform HEP in chair. After HEP, pt agreed to ambulate to bathroom with max encouragement. Will continue to follow acutely to maximize safety with mobility and activity tolerance.    Follow Up Recommendations  Follow surgeon's recommendation for DC plan and follow-up therapies     Equipment Recommendations  3in1 (PT);Rolling walker with 5" wheels    Recommendations for Other Services       Precautions / Restrictions Precautions Precautions: Fall Precaution Comments: hip fx result of fall, impulsive  Restrictions Weight Bearing Restrictions: Yes LLE Weight Bearing: Weight bearing as tolerated    Mobility  Bed Mobility               General bed mobility comments: OOB in recliner  Transfers Overall transfer level: Needs assistance Equipment used: Rolling walker (2 wheeled) Transfers: Sit to/from Stand Sit to Stand: Min assist;+2 safety/equipment         General transfer comment: min A to boost up to standing. Max cues for hand placement  Ambulation/Gait Ambulation/Gait assistance: Min guard;Min  assist;+2 safety/equipment Ambulation Distance (Feet): 24 Feet Assistive device: Rolling walker (2 wheeled);4-wheeled walker Gait Pattern/deviations: Decreased step length - right;Decreased stance time - left;Decreased weight shift to left;Shuffle;Trunk flexed;Step-through pattern;Narrow base of support Gait velocity: slow   General Gait Details: Pt required max encouragement for ambulation with RW. Cues for postural control. Min guard for majority of gait, pt with 1 LOB posteriorly requiring min A to correct. +2 used for line management.    Stairs             Wheelchair Mobility    Modified Rankin (Stroke Patients Only)       Balance Overall balance assessment: Needs assistance Sitting-balance support: Bilateral upper extremity supported;Feet supported Sitting balance-Leahy Scale: Good     Standing balance support: Bilateral upper extremity supported Standing balance-Leahy Scale: Fair Standing balance comment: able to perform toileting and hand hygiene w/o UE support                            Cognition Arousal/Alertness: Awake/alert Behavior During Therapy: Impulsive Overall Cognitive Status: Impaired/Different from baseline Area of Impairment: Following commands;Safety/judgement;Awareness;Problem solving                       Following Commands: Follows multi-step commands inconsistently;Follows one step commands consistently Safety/Judgement: Decreased awareness of safety Awareness: Intellectual Problem Solving: Slow processing;Difficulty sequencing;Requires verbal cues;Requires tactile cues General Comments: daughter says there are some memory problems at baseline, but he is more impulsive than normal      Exercises Total Joint Exercises Ankle Circles/Pumps: AROM;Both;10 reps;Seated  Quad Sets: AROM;Left;10 reps;Seated Short Arc Quad: AROM;Left;10 reps;Seated Heel Slides: AROM;Left;10 reps;Seated Hip ABduction/ADduction: AROM;Left;10  reps;Seated(isometric )    General Comments General comments (skin integrity, edema, etc.): Pt assisted to restroom for toileting, hand hygiene, and oral hygiene      Pertinent Vitals/Pain Pain Assessment: 0-10 Pain Score: 8  Pain Location: left knee, back Pain Descriptors / Indicators: Guarding Pain Intervention(s): Monitored during session;Limited activity within patient's tolerance;Patient requesting pain meds-RN notified;RN gave pain meds during session    Home Living                      Prior Function            PT Goals ([place] goals can now be found in the care plan section) Acute Rehab PT Goals Patient Stated Goal: to walk dog PT Goal Formulation: With patient/family Time For Goal Achievement: 12/19/17 Potential to Achieve Goals: Fair Progress towards PT goals: Not progressing toward goals - comment(self limiting today. Pt declining to walk farther)    Frequency    Min 5X/week      PT Plan Frequency needs to be updated;[place] plan remains appropriate    Co-evaluation              AM-PAC PT "6 Clicks" Daily Activity  Outcome Measure  Difficulty turning over in bed (including adjusting bedclothes, sheets and blankets)?: A Lot Difficulty moving from lying on back to sitting on the side of the bed? : Unable Difficulty sitting down on and standing up from a chair with arms (e.g., wheelchair, bedside commode, etc,.)?: Unable Help needed moving to and from a bed to chair (including a wheelchair)?: A Little Help needed walking in hospital room?: A Little Help needed climbing 3-5 steps with a railing? : A Lot 6 Click Score: 12    End of Session Equipment Utilized During Treatment: Gait belt Activity Tolerance: Patient tolerated treatment well Patient left: in chair;with call bell/phone within reach;with chair alarm set;with family/visitor present Nurse Communication: Mobility status PT Visit Diagnosis: Unsteadiness on feet (R26.81);Other  abnormalities of gait and mobility (R26.89);Repeated falls (R29.6);History of falling (Z91.81);Muscle weakness (generalized) (M62.81);Difficulty in walking, not elsewhere classified (R26.2);Dizziness and giddiness (R42)     Time: 1884-1660 PT Time Calculation (min) (ACUTE ONLY): 40 min  Charges:  $Gait Training: 8-22 mins $Therapeutic Exercise: 8-22 mins $Therapeutic Activity: 8-22 mins                    G Codes:      Benjiman Core, [place] Pager [phone] Acute Rehab   Allena Katz 12/07/2017, 3:40 PM

## 2017-12-07 NOTE — Progress Notes (Signed)
ANTICOAGULATION CONSULT NOTE  Pharmacy Consult:  Coumadin Indication: atrial fibrillation  Assessment/Plan: No further GI work-up planned. Ok to resume Coumadin.  Coumadin 5mg  PO today as previously ordered Daily PT / INR  Renold Genta, PharmD, BCPS Clinical Pharmacist Clinical phone for 12/07/2017 until 10p is [phone] 12/07/2017 5:40 PM

## 2017-12-07 NOTE — NC FL2 (Signed)
[place] MEDICAID FL2 LEVEL OF CARE SCREENING TOOL     IDENTIFICATION  Patient Name: [name] Birthdate: 1929-11-02 Sex: male Admission Date ([place] Location): 11/29/2017  [facility] and [place] Number:  Herbalist and Address:  [facility], [place] [address] [place], [place] [address]      Provider Number: [id number]  Attending Physician Name and Address:  Elmarie Shiley, MD  Relative Name and Phone Number:  Lysbeth Galas [place], spouse, [phone]    [place] Level of Care: Hospital Recommended Level of Care: [place] Prior Approval Number:    Date Approved/Denied:   PASRR Number: pending  Discharge Plan: SNF    [place] Diagnoses: Patient Active Problem List   Diagnosis Date Noted  . Closed displaced fracture of left femoral neck ([place]) 12/05/2017  . Status post total replacement of left hip   . Benign essential HTN   . History of CVA (cerebrovascular accident)   . Cardiac pacemaker in situ   . Postoperative pain   . Acute blood loss anemia   . Closed left hip fracture ([place]) 11/29/2017  . Lower leg edema 11/29/2017  . Fall 11/29/2017  . Essential hypertension 11/29/2017  . Stroke (cerebrum) ([place]) 11/29/2017  . BPH (benign prostatic hyperplasia) 11/29/2017  . Dementia 11/29/2017  . CKD (chronic kidney disease), stage III ([place]) 11/29/2017  . Closed right hip fracture, initial encounter ([place]) 11/29/2017  . Diarrhea 11/29/2017  . Osteoarthritis of fifth carpometacarpal joint of left hand 07/18/2017  . Lung nodule 02/21/2016  . SIRS (systemic inflammatory response syndrome) ([place]) 01/02/2016  . Sepsis ([place]) 01/02/2016  . Encounter for therapeutic drug monitoring 09/23/2013  . Premature ventricular contraction 06/03/2013  . Pacemaker-Medtronic 01/04/2012  . Dyspnea 10/28/2011  . Complete heart block ([place]) 09/05/2011  . Long term ([place]) use of anticoagulants 02/04/2011  . BRADYCARDIA-TACHYCARDIA SYNDROME  08/16/2010  . ALLERGIC RHINITIS 12/24/2007  . Obstructive sleep apnea 08/17/2007  . RESTLESS LEG SYNDROME 08/17/2007  . FIBRILLATION, ATRIAL 08/17/2007  . BRONCHITIS 08/17/2007  . Esophageal reflux 08/17/2007  . Insomnia secondary to chronic pain 08/17/2007  . CHEYNE-STOKES RESPIRATION 08/17/2007    Orientation RESPIRATION BLADDER Height & Weight     Situation, Place, Time, Self  Normal Continent Weight: 180 lb (81.6 kg) Height:  5\' 10"  (177.8 cm)  BEHAVIORAL SYMPTOMS/MOOD NEUROLOGICAL BOWEL NUTRITION STATUS      Continent Diet(See DC Summary)  AMBULATORY STATUS COMMUNICATION OF NEEDS Skin   Extensive Assist(mod assist) Verbally Surgical wounds, Wound Vac                       Personal Care Assistance Level of Assistance  Bathing, Feeding, Dressing Bathing Assistance: Maximum assistance Feeding assistance: Limited assistance Dressing Assistance: Maximum assistance     Functional Limitations Info  Sight, Hearing, Speech Sight Info: Adequate Hearing Info: Adequate Speech Info: Adequate    SPECIAL CARE FACTORS FREQUENCY  PT (By licensed PT), OT (By licensed OT)     PT Frequency: 7x week OT Frequency: 3x week            Contractures Contractures Info: Not present    Additional Factors Info  Code Status, Allergies Code Status Info: Full Allergies Info: No Known Allergies           [place] Medications (12/07/2017):  This is the [facility] active medication list [place] Facility-Administered Medications  Medication Dose Route Frequency Provider Last Rate Last Dose  . 0.9 %  sodium chloride  infusion   Intravenous Continuous Regalado, Belkys A, MD 75 mL/hr at 12/06/17 1455    . 0.9 %  sodium chloride infusion   Intravenous Once Regalado, Belkys A, MD      . acetaminophen (TYLENOL) tablet 650 mg  650 mg Oral Q6H PRN Rod Can, MD   650 mg at 12/06/17 0750  . clonazePAM (KLONOPIN) tablet 2 mg  2 mg Oral QHS Rod Can, MD   2 mg at 12/06/17 2051   . docusate sodium (COLACE) capsule 100 mg  100 mg Oral BID Rod Can, MD   100 mg at 12/06/17 2051  . doxazosin (CARDURA) tablet 2 mg  2 mg Oral Daily Hongalgi, Lenis Dickinson, MD   2 mg at 12/06/17 1043  . finasteride (PROSCAR) tablet 5 mg  5 mg Oral Daily Swinteck, Aaron Edelman, MD   5 mg at 12/06/17 1039  . galantamine (RAZADYNE) tablet 8 mg  8 mg Oral BID Rod Can, MD   8 mg at 12/06/17 2053  . hydrALAZINE (APRESOLINE) injection 5 mg  5 mg Intravenous Q2H PRN Swinteck, Aaron Edelman, MD      . HYDROcodone-acetaminophen (NORCO) 7.5-325 MG per tablet 1-2 tablet  1-2 tablet Oral Q4H PRN Rod Can, MD   2 tablet at 12/06/17 1454  . HYDROcodone-acetaminophen (NORCO/VICODIN) 5-325 MG per tablet 1-2 tablet  1-2 tablet Oral Q4H PRN Rod Can, MD   1 tablet at 12/06/17 2054  . hydrocortisone cream 0.5 %   Topical BID Swinteck, Aaron Edelman, MD      . ipratropium (ATROVENT) 0.06 % nasal spray 2 spray  2 spray Each Nare BID Rod Can, MD   2 spray at 12/06/17 2053  . lactated ringers infusion   Intravenous Continuous Rod Can, MD 10 mL/hr at 12/04/17 1928    . loperamide (IMODIUM) capsule 2 mg  2 mg Oral PRN Rod Can, MD   2 mg at 12/01/17 1101  . menthol-cetylpyridinium (CEPACOL) lozenge 3 mg  1 lozenge Oral PRN Swinteck, Aaron Edelman, MD       Or  . phenol (CHLORASEPTIC) mouth spray 1 spray  1 spray Mouth/Throat PRN Swinteck, Aaron Edelman, MD      . methocarbamol (ROBAXIN) tablet 500 mg  500 mg Oral Q8H PRN Rod Can, MD   500 mg at 12/06/17 2054  . metoCLOPramide (REGLAN) tablet 5-10 mg  5-10 mg Oral Q8H PRN Swinteck, Aaron Edelman, MD       Or  . metoCLOPramide (REGLAN) injection 5-10 mg  5-10 mg Intravenous Q8H PRN Swinteck, Aaron Edelman, MD      . metoprolol tartrate (LOPRESSOR) tablet 25 mg  25 mg Oral BID Modena Jansky, MD   25 mg at 12/06/17 2123  . morphine 2 MG/ML injection 0.5-1 mg  0.5-1 mg Intravenous Q2H PRN Rod Can, MD   1 mg at 12/05/17 6269  . multivitamin with minerals tablet 1  tablet  1 tablet Oral Daily Rod Can, MD   1 tablet at 12/06/17 1040  . ondansetron (ZOFRAN) injection 4 mg  4 mg Intravenous Q8H PRN Swinteck, Aaron Edelman, MD      . polyethylene glycol (MIRALAX / GLYCOLAX) packet 17 g  17 g Oral Daily PRN Swinteck, Aaron Edelman, MD      . polyvinyl alcohol (LIQUIFILM TEARS) 1.4 % ophthalmic solution 1 drop  1 drop Both Eyes QID PRN Rod Can, MD   1 drop at 12/03/17 0949  . warfarin (COUMADIN) tablet 5 mg  5 mg Oral ONCE-1800 Tyrone Apple, RPH      .  Warfarin - Pharmacist Dosing Inpatient   Does not apply q1800 Modena Jansky, MD      . zolpidem (AMBIEN) tablet 5 mg  5 mg Oral QHS PRN Rod Can, MD         Discharge Medications: Please see discharge summary for a list of discharge medications.  Relevant Imaging Results:  Relevant Lab Results:   Additional Information SS#: [id number]  Normajean Baxter, LCSW

## 2017-12-07 NOTE — Care Management (Signed)
Case manager gave denial letter from [facility] to   patient's wife.

## 2017-12-07 NOTE — Consult Note (Signed)
Referring Provider: Dr. Tyrell Antonio Primary Care Physician:  [name], MD Primary Gastroenterologist:  Dr. Michail Sermon  Reason for Consultation:  Anemia  HPI: [name] is a 84 y.o. male with multiple medical problems who is s/p left hip fracture repair being seen for a consult due to anemia. No overt bleeding. No hemoccult done. Hgb 8.2 up from 7.3 after transfusion of 2 U PRBCs. Patient is demented and cannot give me any history. Family in room. Normal EGD in 2010. Normal colonoscopy in 2004. On Coumadin with INR 1.74. No report of hematochezia/melena/hematemesis.  Past Medical History:  Diagnosis Date  . Allergic rhinitis   . Arthritis   . BPH (benign prostatic hyperplasia)   . Bronchitis   . Complete heart block ([place])    pacemaker originally placed in 1988  . Dementia   . Esophageal reflux   . Hypertension   . Lung nodule    noted on CXR December 2012  . Normal nuclear stress test 2013  . OSA (obstructive sleep apnea)    uses cpap  . Permanent atrial fibrillation (HCC)    on coumadin  . Pneumonia   . Presence of permanent cardiac pacemaker   . Renal insufficiency    Rt kidney removed d/t maglinant tumor  . RLS (restless legs syndrome)   . Stroke [facility])    TIA greater than 20 years  . Transitional cell carcinoma ([place])    s/p nephrectomy in  2010    Past Surgical History:  Procedure Laterality Date  . CARDIOVASCULAR STRESS TEST  03/23/2009   EF 65%, NORMAL  . CATARACT EXTRACTION W/PHACO Left 08/26/2015   Procedure: CATARACT EXTRACTION PHACO AND INTRAOCULAR LENS PLACEMENT (IOC);  Surgeon: Marylynn Pearson, MD;  Location: [facility];  Service: Ophthalmology;  Laterality: Left;  . DOPPLER ECHOCARDIOGRAPHY  03/26/2001   EF 55%  . EYE SURGERY     tumor removed from Rt eye lid  . HERNIA REPAIR     multiple  . MASS EXCISION Left 11/07/2012   Procedure: MINOR EXCISION OF MASS;  Surgeon: Haywood Lasso, MD;  Location: [facility];  Service: General;  Laterality:  Left;  . NEPHRECTOMY  2010   rt  . PACEMAKER INSERTION  1988   most recent generator change by Dr Rayann Heman 20067/2/13 with new right ventricular lead placed  . PERMANENT PACEMAKER GENERATOR CHANGE N/A 01/03/2012   Procedure: PERMANENT PACEMAKER GENERATOR CHANGE;  Surgeon: Thompson Grayer, MD;  Location: [facility] CATH LAB;  Service: Cardiovascular;  Laterality: N/A;  . US ECHOCARDIOGRAPHY  04/22/2008   EF 55-60%    Prior to Admission medications   Medication Sig Start Date End Date Taking? Authorizing Provider  acetaminophen (TYLENOL) 325 MG tablet Take 650 mg by mouth every 4 (four) hours as needed for mild pain or fever.    Yes [provider]  aspirin 81 MG tablet Take 81 mg by mouth daily.    Yes [provider]  clonazePAM (KLONOPIN) 2 MG tablet Take 1 tablet (2 mg total) by mouth at bedtime. 09/22/16  Yes Young, Tarri Fuller D, MD  cycloSPORINE (RESTASIS) 0.05 % ophthalmic emulsion Place 1 drop into both eyes 2 (two) times daily.    Yes [provider]  doxazosin (CARDURA) 2 MG tablet Take 2 mg by mouth daily.   Yes [provider]  finasteride (PROSCAR) 5 MG tablet Take 5 mg by mouth daily.   Yes [provider]  furosemide (LASIX) 40 MG tablet Take 1 tablet (40 mg  total) by mouth daily. 02/20/17  Yes Allred, Jeneen Rinks, MD  galantamine (RAZADYNE) 8 MG tablet Take 8 mg by mouth 2 (two) times daily.   Yes [provider]  glucosamine-chondroitin 500-400 MG tablet Take 1 tablet by mouth 2 (two) times daily.    Yes [provider]  ipratropium (ATROVENT) 0.06 % nasal spray Place 2 sprays into both nostrils 2 (two) times daily.   Yes [provider]  metoprolol tartrate (LOPRESSOR) 25 MG tablet Take 25 mg by mouth 2 (two) times daily.   Yes [provider]  Multiple Vitamin (MULTIVITAMIN) capsule Take 1 capsule by mouth daily.     Yes [provider]  warfarin (COUMADIN) 5 MG tablet Take as directed by coumadin  clinic Patient taking differently: Take 2.5-5 mg by mouth See admin instructions. Take 1/2 tablet on Monday then take 1 tablet all the other days 09/21/17  Yes Allred, Jeneen Rinks, MD    Scheduled Meds: . clonazePAM  2 mg Oral QHS  . docusate sodium  100 mg Oral BID  . finasteride  5 mg Oral Daily  . galantamine  8 mg Oral BID  . hydrocortisone cream   Topical BID  . ipratropium  2 spray Each Nare BID  . metoprolol tartrate  25 mg Oral BID  . multivitamin with minerals  1 tablet Oral Daily  . polyethylene glycol  17 g Oral BID  . senna  1 tablet Oral Daily   Continuous Infusions: . sodium chloride 75 mL/hr at 12/06/17 1455  . sodium chloride    . lactated ringers 10 mL/hr at 12/04/17 1928   PRN Meds:.acetaminophen, hydrALAZINE, HYDROcodone-acetaminophen, loperamide, menthol-cetylpyridinium **OR** phenol, methocarbamol, metoCLOPramide **OR** metoCLOPramide (REGLAN) injection, morphine injection, ondansetron (ZOFRAN) IV, polyvinyl alcohol, zolpidem  Allergies as of 11/29/2017  . (No Known Allergies)    Family History  Problem Relation Age of Onset  . Stroke Mother   . Stroke Father     Social History   Socioeconomic History  . Marital status: Married    Spouse name: Not on file  . Number of children: Not on file  . Years of education: Not on file  . Highest education level: Not on file  Occupational History  . Occupation: Retired Technical sales engineer: RETIRED  Social Needs  . Financial resource strain: Not on file  . Food insecurity:    Worry: Not on file    Inability: Not on file  . Transportation needs:    Medical: Not on file    Non-medical: Not on file  Tobacco Use  . Smoking status: Never Smoker  . Smokeless tobacco: Never Used  Substance and Sexual Activity  . Alcohol use: No  . Drug use: No  . Sexual activity: Not on file  Lifestyle  . Physical activity:    Days per week: Not on file    Minutes per session: Not on file  . Stress: Not on file   Relationships  . Social connections:    Talks on phone: Not on file    Gets together: Not on file    Attends religious service: Not on file    Active member of club or organization: Not on file    Attends meetings of clubs or organizations: Not on file    Relationship status: Not on file  . Intimate partner violence:    Fear of [place] or ex partner: Not on file    Emotionally abused: Not on file    Physically abused:  Not on file    Forced sexual activity: Not on file  Other Topics Concern  . Not on file  Social History Narrative  . Not on file    Review of Systems: All negative except as stated above in HPI.  Physical Exam: Vital signs: Vitals:   12/07/17 1130 12/07/17 1409  BP: 124/63 115/63  Pulse: 70 70  Resp: 18 20  Temp: 99.4 F (37.4 C) 98.7 F (37.1 C)  SpO2: 100% 100%   Last BM Date: 12/05/17 General:  Lethargic, demented, elderly, thin, no acute distress Head: normocephalic, atraumatic Eyes: anicteric sclera ENT: oropharynx clear Neck: supple, nontender Lungs:  Clear throughout to auscultation.   No wheezes, crackles, or rhonchi. No acute distress. Heart:  Regular rate and rhythm; no murmurs, clicks, rubs,  or gallops. Abdomen: soft, nontender, nondistended, +BS  Rectal:  Deferred Ext: no edema  GI:  Lab Results: Recent Labs    12/06/17 0337 12/06/17 1433 12/07/17 0314 12/07/17 0711  WBC 7.0  --  7.2 8.8  HGB 8.0* 8.4* 7.3* 8.2*  HCT 25.1* 26.3* 22.9* 26.4*  PLT 170  --  146* 190   BMET Recent Labs    12/05/17 1826 12/06/17 0337 12/07/17 0314  NA 137 136 138  K 4.8 4.3 4.4  CL 104 108 108  CO2 25 23 23   GLUCOSE 172* 128* 117*  BUN 40* 42* 43*  CREATININE 1.93* 1.90* 1.76*  CALCIUM 9.0 8.5* 8.5*   LFT No results for input(s): PROT, ALBUMIN, AST, ALT, ALKPHOS, BILITOT, BILIDIR, IBILI in the last 72 hours. PT/INR Recent Labs    12/06/17 0337 12/07/17 0314  LABPROT 19.0* 20.2*  INR 1.61 1.74     Studies/Results: Ct Abdomen  Pelvis Wo Contrast  Result Date: 12/07/2017 CLINICAL DATA:  Unexplained anemia. Suspected retroperitoneal hemorrhage. Three days postop from left hip arthroplasty. EXAM: CT ABDOMEN AND PELVIS WITHOUT CONTRAST TECHNIQUE: Multidetector CT imaging of the abdomen and pelvis was performed following the standard protocol without IV contrast. COMPARISON:  01/02/2016 FINDINGS: Lower chest: No acute findings. Stable cardiomegaly with pacemaker leads in right heart. Hepatobiliary: No mass visualized on this unenhanced exam. Gallbladder is unremarkable. Pancreas: No mass or inflammatory process visualized on this unenhanced exam. Spleen:  Within normal limits in size. Adrenals/Urinary tract: Prior right nephrectomy. Small fluid attenuation left renal cyst again noted. No evidence of urolithiasis or hydronephrosis. Unremarkable unopacified urinary bladder. Stomach/Bowel: No evidence of obstruction, inflammatory process, or abnormal fluid collections. Vascular/Lymphatic: No pathologically enlarged lymph nodes identified. No evidence of abdominal aortic aneurysm. Aortic atherosclerosis. Reproductive:  No mass or other significant abnormality. Other: No evidence of retroperitoneal hemorrhage or hemoperitoneum. New soft tissue stranding is seen within the subcutaneous tissues in the proximal left thigh and left lower anterior abdominal wall. No focal hematoma or other fluid collection identified. Musculoskeletal: Left hip arthroplasty is seen in expected position. No evidence of fracture or other suspicious bone lesions. IMPRESSION: Soft tissue stranding within subcutaneous tissues of the proximal left thigh and left lower anterior abdominal wall. No evidence of focal hematoma or other fluid collection. No evidence of retroperitoneal hemorrhage or hemoperitoneum. Electronically Signed   By: Earle Gell M.D.   On: 12/07/2017 10:08    Impression/Plan: Anemia without any overt GI bleeding and source of anemia likely  multifactorial with his recent surgery. I do not think he is having a GI bleed causing his anemia and if his H/H continues to fall then may need a CT to r/o retroperitoneal bleed or  re-evaluation of his surgical site. No plans for endoscopic management at this time. Will start Protonix 40 mg PO BID. Supportive care. Call us back if needed or questions arise.    LOS: 8 days   [place] C.  12/07/2017, 2:26 PM  Questions please call [phone]

## 2017-12-07 NOTE — Social Work (Signed)
CSW provided spouse at bedside list of SNF offers to review.  CSW will continue to assist with disposition.  Elissa Hefty, LCSW Clinical Social Worker [phone]

## 2017-12-07 NOTE — Social Work (Addendum)
CSW faxed clinicals to [facility] to review and obtain PASSR for SNF placement.  12:46pm PASSR#[phone] E received for SNF placement.  Elissa Hefty, LCSW Clinical Social Worker [phone]

## 2017-12-07 NOTE — Progress Notes (Signed)
Orthopedic Tech Progress Note Patient Details:  [name] [birth year] [id number]  Patient ID: [name], male   DOB: [birth year], 84 y.o.   MRN: [id number] Pt cant have ohf due to age restrictions.  Karolee Stamps 12/07/2017, 12:29 AM

## 2017-12-07 NOTE — Progress Notes (Signed)
    Subjective:  Patient reports pain as moderate.  Denies N/V/CP/SOB. No c/o.  Objective:   VITALS:   Vitals:   12/07/17 0341 12/07/17 1001 12/07/17 1130 12/07/17 1409  BP: 110/61 115/63 124/63 115/63  Pulse: 70 70 70 70  Resp: 16 16 18 20   Temp:  (!) 97.3 F (36.3 C) 99.4 F (37.4 C) 98.7 F (37.1 C)  TempSrc:  Oral Oral Oral  SpO2: 99% 99% 100% 100%  Weight:      Height:        NAD ABD soft Intact pulses distally Dorsiflexion/Plantar flexion intact Incision: dressing C/D/I Compartment soft Prevena intact Thigh mildly swollen as expected  Lab Results  Component Value Date   WBC 8.8 12/07/2017   HGB 8.2 (L) 12/07/2017   HCT 26.4 (L) 12/07/2017   MCV 98.9 12/07/2017   PLT 190 12/07/2017   BMET    Component Value Date/Time   NA 138 12/07/2017 0314   K 4.4 12/07/2017 0314   CL 108 12/07/2017 0314   CO2 23 12/07/2017 0314   GLUCOSE 117 (H) 12/07/2017 0314   BUN 43 (H) 12/07/2017 0314   CREATININE 1.76 (H) 12/07/2017 0314   CALCIUM 8.5 (L) 12/07/2017 0314   GFRNONAA 33 (L) 12/07/2017 0314   GFRAA 39 (L) 12/07/2017 0314   INR 1.74  Assessment/Plan: 3 Days Post-Op   Principal Problem:   Closed left hip fracture (HCC) Active Problems:   Obstructive sleep apnea   FIBRILLATION, ATRIAL   Long term ([place]) use of anticoagulants   Pacemaker-Medtronic   Lower leg edema   Fall   Essential hypertension   Stroke (cerebrum) (HCC)   BPH (benign prostatic hyperplasia)   Dementia   CKD (chronic kidney disease), stage III (HCC)   Closed right hip fracture, initial encounter (HCC)   Diarrhea   Closed displaced fracture of left femoral neck (HCC)   Status post total replacement of left hip   Benign essential HTN   History of CVA (cerebrovascular accident)   Cardiac pacemaker in situ   Postoperative pain   Acute blood loss anemia   WBAT with walker DVT ppx: Lovenox and Coumadin (D/C lovenox when INR > 1.8), SCDs, TEDS PO pain control PT/OT - 2 person  assist Dispo: D/C to SNF if no one available to assist, plan to convert prevena to portable home unit just prior to d/c   Bertram Savin 12/07/2017, 5:55 PM   Rod Can, MD Cell [phone]

## 2017-12-07 NOTE — Progress Notes (Signed)
Pt received 1 PRBC, no transfusion reaction noted.

## 2017-12-07 NOTE — Progress Notes (Signed)
ANTICOAGULATION CONSULT NOTE  Pharmacy Consult:  Coumadin Indication: atrial fibrillation  No Known Allergies  Patient Measurements: Height: 5\' 10"  (177.8 cm) Weight: 180 lb (81.6 kg) IBW/kg (Calculated) : 73  Vital Signs: Temp: 99.1 F (37.3 C) (06/05 2225) Temp Source: Oral (06/05 2225) BP: 110/61 (06/06 0341) Pulse Rate: 70 (06/06 0341)  Labs: Recent Labs    12/05/17 1332  12/05/17 1826 12/06/17 0337 12/06/17 1433 12/07/17 0314 12/07/17 0711  HGB  --    < > 9.0* 8.0* 8.4* 7.3* 8.2*  HCT  --    < > 28.5* 25.1* 26.3* 22.9* 26.4*  PLT  --    < > 211 170  --  146* 190  LABPROT 16.3*  --   --  19.0*  --  20.2*  --   INR 1.32  --   --  1.61  --  1.74  --   CREATININE  --   --  1.93* 1.90*  --  1.76*  --    < > = values in this interval not displayed.    Estimated Creatinine Clearance: 31.1 mL/min (A) (by C-G formula based on SCr of 1.76 mg/dL (H)).   Assessment: 88 YOM on Coumadin PTA for Afib (CHADsVASc = 5).  He received Vitamin K x3 doses and then started on IV heparin bridge.  S/p hip surgery on 12/04/17 and Coumadin resumed post-op.  Lovenox bridge discontinued on 12/07/17.  INR sub-therapeutic and trending up.  No bleeding reported; however, patient did receive transfusion d/t ABLA.  Home Coumadin dose:  5mg  PO daily except 2.5mg  on Monday.  MD requested to resume home regimen, no loading.   Goal of Therapy:  INR 2 - 3 Monitor platelets by anticoagulation protocol: Yes    Plan:  Coumadin 5mg  PO today Daily PT / INR   Danaisha Celli D. Mina Marble, PharmD, BCPS, BCCCP Pager:  [phone] 12/07/2017, 8:49 AM

## 2017-12-08 LAB — BASIC METABOLIC PANEL
Anion gap: 5 (ref 5–15)
BUN: 36 mg/dL — ABNORMAL HIGH (ref 6–20)
CO2: 24 mmol/L (ref 22–32)
Calcium: 8.6 mg/dL — ABNORMAL LOW (ref 8.9–10.3)
Chloride: 108 mmol/L (ref 101–111)
Creatinine, Ser: 1.66 mg/dL — ABNORMAL HIGH (ref 0.61–1.24)
GFR calc Af Amer: 41 mL/min — ABNORMAL LOW (ref >60)
GFR calc non Af Amer: 36 mL/min — ABNORMAL LOW (ref >60)
Glucose, Bld: 119 mg/dL — ABNORMAL HIGH (ref 65–99)
Potassium: 4.4 mmol/L (ref 3.5–5.1)
Sodium: 137 mmol/L (ref 135–145)

## 2017-12-08 LAB — PROTIME-INR
INR: 1.64
Prothrombin Time: 19.3 seconds — ABNORMAL HIGH (ref 11.4–15.2)

## 2017-12-08 LAB — CBC
HCT: 26.3 % — ABNORMAL LOW (ref 39.0–52.0)
Hemoglobin: 8.4 g/dL — ABNORMAL LOW (ref 13.0–17.0)
MCH: 31.3 pg (ref 26.0–34.0)
MCHC: 31.9 g/dL (ref 30.0–36.0)
MCV: 98.1 fL (ref 78.0–100.0)
PLATELETS: 175 10*3/uL (ref 150–400)
RBC: 2.68 MIL/uL — AB (ref 4.22–5.81)
RDW: 15.8 % — AB (ref 11.5–15.5)
WBC: 6.5 10*3/uL (ref 4.0–10.5)

## 2017-12-08 MED ORDER — ENOXAPARIN SODIUM 80 MG/0.8ML ~~LOC~~ SOLN
1.0000 mg/kg | SUBCUTANEOUS | Status: DC
  Administered 2017-12-08: 80 mg via SUBCUTANEOUS
  Filled 2017-12-08: qty 0.8

## 2017-12-08 MED ORDER — MINERAL OIL RE ENEM
1.0000 | ENEMA | Freq: Once | RECTAL | Status: AC
  Filled 2017-12-08 (×2): qty 1

## 2017-12-08 MED ORDER — WARFARIN SODIUM 6 MG PO TABS
6.0000 mg | ORAL_TABLET | Freq: Once | ORAL | Status: AC
  Administered 2017-12-08: 6 mg via ORAL
  Filled 2017-12-08: qty 1

## 2017-12-08 MED ORDER — MAGNESIUM HYDROXIDE 400 MG/5ML PO SUSP
5.0000 mL | Freq: Every day | ORAL | Status: DC | PRN
Start: 2017-12-08 — End: 2017-12-09

## 2017-12-08 MED ORDER — BISACODYL 10 MG RE SUPP
10.0000 mg | Freq: Once | RECTAL | Status: AC
  Filled 2017-12-08: qty 1

## 2017-12-08 NOTE — Progress Notes (Signed)
ANTICOAGULATION CONSULT NOTE  Pharmacy Consult:  Coumadin, adding Lovenox Indication: atrial fibrillation  No Known Allergies  Patient Measurements: Height: 5\' 10"  (177.8 cm) Weight: 180 lb (81.6 kg) IBW/kg (Calculated) : 73  Vital Signs: Temp: 98.5 F (36.9 C) (06/07 1447) Temp Source: Oral (06/07 1447) BP: 123/60 (06/07 1447) Pulse Rate: 70 (06/07 1447)  Labs: Recent Labs    12/06/17 0337  12/07/17 0314 12/07/17 0711 12/08/17 0516  HGB 8.0*   < > 7.3* 8.2* 8.4*  HCT 25.1*   < > 22.9* 26.4* 26.3*  PLT 170  --  146* 190 175  LABPROT 19.0*  --  20.2*  --  19.3*  INR 1.61  --  1.74  --  1.64  CREATININE 1.90*  --  1.76*  --  1.66*   < > = values in this interval not displayed.    Estimated Creatinine Clearance: 33 mL/min (A) (by C-G formula based on SCr of 1.66 mg/dL (H)).   Assessment: 88 YOM on Coumadin PTA for Afib (CHADsVASc = 5).  He received Vitamin K x3 doses and then started on IV heparin bridge.  S/p hip surgery on 12/04/17 and Coumadin resumed post-op.  Lovenox bridge discontinued on 12/07/17.  INR sub-therapeutic and trending down this AM despite 4 days of 5 mg (consistent with home dosing) likely in part due to vitamin K resistane.  No bleeding reported; however, patient did receive transfusion d/t ABLA. GI consulted and agree no GIB and no plan for endoscopic evaluation. Coumadin continues.    Home Coumadin dose:  5mg  PO daily except 2.5mg  on Monday.     Goal of Therapy:  INR 2 - 3 Monitor platelets by anticoagulation protocol: Yes    Plan:  Coumadin 6mg  PO today (higher dose due to decreasing INR in setting of recent vitamin K) Daily PT / INR   Sloan Leiter, PharmD, BCPS, BCCCP Clinical Pharmacist Clinical phone 12/08/2017 until 3:30PM - #[phone] After hours, please call [phone] 12/08/2017, 7:30 PM    Addendum: Pharmacy consulted to begin Lovenox bridge for Afib while INR is subtherapeutic.  Known CKD stage 3, SCr 1.4-1.5s, CrCl ~33 ml/min. Will  dose Lovenox once daily with age, low Hgb, and renal function.  Lovenox 80 mg SQ q24h CBC q72h while on Lovenox Monitor for s/sx of bleeding   Renold Genta, PharmD, BCPS Clinical Pharmacist Clinical phone for 12/08/2017 until 10p is [phone] 12/08/2017 7:31 PM

## 2017-12-08 NOTE — Progress Notes (Signed)
    Subjective:  Patient reports pain as mild to moderate.  Denies N/V/CP/SOB. No c/o. Received 1 unit PRBCs yesterday.  Objective:   VITALS:   Vitals:   12/07/17 1409 12/07/17 2150 12/08/17 0457 12/08/17 0800  BP: 115/63 127/66 139/66 (!) 123/56  Pulse: 70 82 71 70  Resp: 20 16 14 16   Temp: 98.7 F (37.1 C) 98.4 F (36.9 C) 98.4 F (36.9 C)   TempSrc: Oral Oral Oral   SpO2: 100% 97% 98%   Weight:      Height:        NAD ABD soft Intact pulses distally Dorsiflexion/Plantar flexion intact Incision: dressing C/D/I Compartment soft Prevena intact Thigh mildly swollen as expected  Lab Results  Component Value Date   WBC 6.5 12/08/2017   HGB 8.4 (L) 12/08/2017   HCT 26.3 (L) 12/08/2017   MCV 98.1 12/08/2017   PLT 175 12/08/2017   BMET    Component Value Date/Time   NA 137 12/08/2017 0516   K 4.4 12/08/2017 0516   CL 108 12/08/2017 0516   CO2 24 12/08/2017 0516   GLUCOSE 119 (H) 12/08/2017 0516   BUN 36 (H) 12/08/2017 0516   CREATININE 1.66 (H) 12/08/2017 0516   CALCIUM 8.6 (L) 12/08/2017 0516   GFRNONAA 36 (L) 12/08/2017 0516   GFRAA 41 (L) 12/08/2017 0516   INR 1.64  Assessment/Plan: 4 Days Post-Op   Principal Problem:   Closed left hip fracture (HCC) Active Problems:   Obstructive sleep apnea   FIBRILLATION, ATRIAL   Long term ([place]) use of anticoagulants   Pacemaker-Medtronic   Lower leg edema   Fall   Essential hypertension   Stroke (cerebrum) (HCC)   BPH (benign prostatic hyperplasia)   Dementia   CKD (chronic kidney disease), stage III (HCC)   Closed right hip fracture, initial encounter ([place])   Diarrhea   Closed displaced fracture of left femoral neck (HCC)   Status post total replacement of left hip   Benign essential HTN   History of CVA (cerebrovascular accident)   Cardiac pacemaker in situ   Postoperative pain   Acute blood loss anemia   WBAT with walker DVT ppx: Lovenox and Coumadin (D/C lovenox when INR > 1.8), SCDs,  TEDS PO pain control PT/OT - 2+ person assist Dispo: D/C to SNF, plan to convert prevena to portable home unit just prior to d/c   [name] 12/08/2017, 12:35 PM   Rod Can, MD Cell [phone]

## 2017-12-08 NOTE — Progress Notes (Addendum)
Physical Therapy Treatment Patient Details Name: [name] MRN: [id number] DOB: 1930/04/01 Today's Date: 12/08/2017    History of Present Illness 84 y.o. male with medical history significant of hypertension, stroke, GERD, anxiety, BPH, third-degree AV block, pacemaker placement, OSA on CPAP, atrial fibrillation on Coumadin, transitional renal cell carcinoma (nephrectomy), dementia, CKD 3, who presents with fall and left hip pain X-ray in the office show left hip subcapital fracture slightly impacted. s/p direct anterior L THA.Marland Kitchen Pt received 2 units PRBC on 6/6 after H&H drop.     PT Comments    Pt received up in chair where he has been for several hours, working on the second half of a sandwich ad lib, and visiting with his Investment banker, corporate, wife enters mid session. BP established seated and standing, WNL, pt denies any prior dizziness, but PT note reports some previously with AMB, prior to blood unit. Noted fluid pooling on floor, found to be resultant of disconnected IV, RN made aware. Pt has difficulty rising from chair, continues to require heavy physical assistance and he requires significant time, VC, tactile cues, and physical assist to DC retropulsion in standing and establish balance with COM over BOS. Pt requires extensive VC to attend to RUE relaxation during vitals. He requires redirection from urge to clean nose or urinate, and VC to focus on safety and RW use. PT/pt's wife assist with urination, during which pt tolerates standing in place with LUE support on RW for 4+ minutes without LOB. AMB in hall subjectively less effortful, however quality, postural deviation, and gait speed all point to significant safety concerns with advancing AMB at this time. HEP is reviewed and discussed with pt/wife, seated portion. Pt progressing slowly overall. No noted dizziness with mobility this date.     Follow Up Recommendations  Follow surgeon's recommendation for DC plan and follow-up  therapies;SNF     Equipment Recommendations  3in1 (PT);Rolling walker with 5" wheels    Recommendations for Other Services OT consult     Precautions / Restrictions Precautions Precautions: Fall Precaution Comments: hip fx result of fall, impulsive  Restrictions LLE Weight Bearing: Weight bearing as tolerated    Mobility  Bed Mobility               General bed mobility comments: received up in chair  Transfers Overall transfer level: Needs assistance Equipment used: Rolling walker (2 wheeled) Transfers: Sit to/from Stand Sit to Stand: Max assist         General transfer comment: Difficulty establishing balance and forward posture with initital retropulsion. Continues to require heavy lift assist for last 30% of standing, VC for safe hand placement. Performed 3 x in session;   Ambulation/Gait   Ambulation Distance (Feet): 70 Feet Assistive device: Rolling walker (2 wheeled) Gait Pattern/deviations: Decreased stance time - left;Decreased weight shift to left;Shuffle;Trunk flexed;Narrow base of support;Antalgic;Step-to pattern;Decreased step length - right;Decreased step length - left;Decreased dorsiflexion - left Gait velocity: 0.22m/s  Gait velocity interpretation: <1.31 ft/sec, indicative of household ambulator General Gait Details: forward flexed, difficulty with TKE in stance phase on Left leg. RW too high, adjusted at end of session to decrease elbow flexion angle.    Stairs             Wheelchair Mobility    Modified Rankin (Stroke Patients Only)       Balance Overall balance assessment: Needs assistance Sitting-balance support: Feet unsupported;No upper extremity supported Sitting balance-Leahy Scale: Good     Standing balance support: Bilateral  upper extremity supported Standing balance-Leahy Scale: Fair Standing balance comment: requires extensive very cues to attend to safety adn support, pt wanted to DC BP assessment to urinate and/or  wipe nose.                             Cognition Arousal/Alertness: Awake/alert Behavior During Therapy: WFL for tasks assessed/performed Overall Cognitive Status: Within Functional Limits for tasks assessed                                        Exercises Total Joint Exercises Ankle Circles/Pumps: Strengthening;15 reps;Both(heel raises in chair, pushing against floor. ) Long Arc Quad: AROM;Seated;Left;10 reps Knee Flexion: AAROM;Left;10 reps;Seated    General Comments        Pertinent Vitals/Pain Pain Assessment: No/denies pain Pain Descriptors / Indicators: Guarding Pain Intervention(s): Monitored during session    Home Living                      Prior Function            PT Goals ([place] goals can now be found in the care plan section) Acute Rehab PT Goals Patient Stated Goal: to walk dog PT Goal Formulation: With patient/family Time For Goal Achievement: 12/19/17 Potential to Achieve Goals: Fair Progress towards PT goals: Progressing toward goals    Frequency    Min 3X/week      PT Plan Frequency needs to be updated;[place] plan remains appropriate    Co-evaluation              AM-PAC PT "6 Clicks" Daily Activity  Outcome Measure  Difficulty turning over in bed (including adjusting bedclothes, sheets and blankets)?: A Lot Difficulty moving from lying on back to sitting on the side of the bed? : Unable Difficulty sitting down on and standing up from a chair with arms (e.g., wheelchair, bedside commode, etc,.)?: Unable Help needed moving to and from a bed to chair (including a wheelchair)?: A Lot Help needed walking in hospital room?: A Lot Help needed climbing 3-5 steps with a railing? : Total 6 Click Score: 9    End of Session Equipment Utilized During Treatment: Gait belt Activity Tolerance: Patient tolerated treatment well;No increased pain Patient left: in chair;with call bell/phone within reach;with  chair alarm set;with family/visitor present Nurse Communication: Mobility status PT Visit Diagnosis: Unsteadiness on feet (R26.81);Other abnormalities of gait and mobility (R26.89);Repeated falls (R29.6);History of falling (Z91.81);Muscle weakness (generalized) (M62.81);Difficulty in walking, not elsewhere classified (R26.2);Dizziness and giddiness (R42)     Time: 1341-1430 PT Time Calculation (min) (ACUTE ONLY): 49 min  Charges:  $Gait Training: 8-22 mins $Therapeutic Exercise: 8-22 mins $Therapeutic Activity: 8-22 mins                    G Codes:     2:51 PM, 2017-12-26 Etta Grandchild, PT, DPT Physical Therapist - [place] [phone] (Pager)  [phone] (Office)          Buccola,Allan C 2017-12-26, 2:45 PM

## 2017-12-08 NOTE — Progress Notes (Signed)
ANTICOAGULATION CONSULT NOTE  Pharmacy Consult:  Coumadin Indication: atrial fibrillation  No Known Allergies  Patient Measurements: Height: 5\' 10"  (177.8 cm) Weight: 180 lb (81.6 kg) IBW/kg (Calculated) : 73  Vital Signs: Temp: 98.4 F (36.9 C) (06/07 0457) Temp Source: Oral (06/07 0457) BP: 139/66 (06/07 0457) Pulse Rate: 71 (06/07 0457)  Labs: Recent Labs    12/06/17 0337  12/07/17 0314 12/07/17 0711 12/08/17 0516  HGB 8.0*   < > 7.3* 8.2* 8.4*  HCT 25.1*   < > 22.9* 26.4* 26.3*  PLT 170  --  146* 190 175  LABPROT 19.0*  --  20.2*  --  19.3*  INR 1.61  --  1.74  --  1.64  CREATININE 1.90*  --  1.76*  --  1.66*   < > = values in this interval not displayed.    Estimated Creatinine Clearance: 33 mL/min (A) (by C-G formula based on SCr of 1.66 mg/dL (H)).   Assessment: 88 YOM on Coumadin PTA for Afib (CHADsVASc = 5).  He received Vitamin K x3 doses and then started on IV heparin bridge.  S/p hip surgery on 12/04/17 and Coumadin resumed post-op.  Lovenox bridge discontinued on 12/07/17.  INR sub-therapeutic and trending down this AM despite 4 days of 5 mg (consistent with home dosing) likely in part due to vitamin K resistane.  No bleeding reported; however, patient did receive transfusion d/t ABLA. GI consulted and agree no GIB and no plan for endoscopic evaluation. Coumadin continues.    Home Coumadin dose:  5mg  PO daily except 2.5mg  on Monday.     Goal of Therapy:  INR 2 - 3 Monitor platelets by anticoagulation protocol: Yes    Plan:  Coumadin 6mg  PO today (higher dose due to decreasing INR in setting of recent vitamin K) Daily PT / INR   Sloan Leiter, PharmD, BCPS, BCCCP Clinical Pharmacist Clinical phone 12/08/2017 until 3:30PM - #[phone] After hours, please call [phone] 12/08/2017, 8:09 AM

## 2017-12-08 NOTE — Progress Notes (Addendum)
PROGRESS NOTE    [name]  TIW:[id number] DOB: [birth year] DOA: 11/29/2017 PCP: [name], MD    Brief Narrative:84 year old married male, physically active and independent, PMH of complete heart block status post permanent pacemaker, last generator changed 2013 (Dr. Rayann Heman, [facility] cardiology), dementia, GERD, HTN, OSA on CPAP, permanent A. fib on Coumadin, transitional cell carcinoma status post right nephrectomy, stage III chronic kidney disease, stroke/TIA, frequent falls (3 this year) missed a step and fell on a concrete surface 5 days PTA leading to left hip pain and fracture, seen at orthopedic office and sent to the hospital for admission.  Orthopedics consulted.  Anticoagulation reversed.  Status post left total hip arthroplasty, anterior approach 6/3.  CIR consulted.   Assessment & Plan:   Principal Problem:   Closed left hip fracture (HCC) Active Problems:   Obstructive sleep apnea   FIBRILLATION, ATRIAL   Long term ([place]) use of anticoagulants   Pacemaker-Medtronic   Lower leg edema   Fall   Essential hypertension   Stroke (cerebrum) (HCC)   BPH (benign prostatic hyperplasia)   Dementia   CKD (chronic kidney disease), stage III (HCC)   Closed right hip fracture, initial encounter ([name])   Diarrhea   Closed displaced fracture of left femoral neck (HCC)   Status post total replacement of left hip   Benign essential HTN   History of CVA (cerebrovascular accident)   Cardiac pacemaker in situ   Postoperative pain   Acute blood loss anemia  1-Left hip fracture;  After a mechanical fall. Unable to have sx until 6-03 due to scheduling issues.  Hopefully CIR admission soon.   2-Acute blood loss anemia;  Post Op. ?  Surgical site no hematoma  CT abdomen pelvis negative for retroperitoneal bleed.  No BM, will try enema.  Received total 2 units PRBC> hb still at 8.4 ? Hemodilution. Will get occult blood today.  Check LFT , bilirubin in am.  Vitals stable.     3-AKI on CKD stage III;  Status post right nephrectomy for TCC/stage III chronic kidney disease Prior admission cr 1.8. Cr 1.4--1.5.  Suspect related to hypotension.  Improved with IV fluids.  Cr decrease to 1.6.  Stop IV fluids.   4-Permanent  A fib;  On coumadin.  Lovenox held yesterday due to concern for bleeding.  Will check for occult blood today, if negative will resume Lovenox.  Will resume lovenox, no clear evidence of active bleeding. When nurse place suppository there were no blood or black stool. I am worry that INR drop today and his risk for stroke.   Status post PPM for complete heart block Stable.  TTE 5/30 shows LVEF 60-65%.  EP Cardiology interrogated PPM prior to surgery and noted without problems.  Dementia; mild; stable.   LE edema;  Doppler negative for DVT   Hypotension; 6-04; hold lasix.  Received IV bolus.  Resolved.     DVT prophylaxis: Lovenox, coumadin.  Code Status: full code.  Family Communication: daughter at bedside.  Disposition Plan: Hopefully CIR>   Consultants:   Ortho    Procedures:  Left total hip arthroplasty, anterior approach 6-03     Antimicrobials:  none  Subjective: He was sitting in reclines. He was able to ambulate with PT today.  He denies dyspnea.  No BM   Objective: Vitals:   12/07/17 1409 12/07/17 2150 12/08/17 0457 12/08/17 0800  BP: 115/63 127/66 139/66 (!) 123/56  Pulse: 70 82 71 70  Resp: 20 16 14  16  Temp: 98.7 F (37.1 C) 98.4 F (36.9 C) 98.4 F (36.9 C)   TempSrc: Oral Oral Oral   SpO2: 100% 97% 98%   Weight:      Height:        Intake/Output Summary (Last 24 hours) at 12/08/2017 1442 Last data filed at 12/08/2017 0900 Gross per 24 hour  Intake 480 ml  Output 550 ml  Net -70 ml   Filed Weights   11/30/17 1554 12/04/17 1919  Weight: 81.6 kg (180 lb) 81.6 kg (180 lb)    Examination:  General exam: NAD Respiratory system: CTA Cardiovascular system: S 1, S 2 RRR Gastrointestinal  system: BS present, soft, nt Central nervous system: Non focal.  Extremities: Symmetric power. Edema Right LE, wound vac in place.  Skin: No rashes, lesions or ulcers    Data Reviewed: I have personally reviewed following labs and imaging studies  CBC: Recent Labs  Lab 12/05/17 1826 12/06/17 0337 12/06/17 1433 12/07/17 0314 12/07/17 0711 12/08/17 0516  WBC 9.4 7.0  --  7.2 8.8 6.5  HGB 9.0* 8.0* 8.4* 7.3* 8.2* 8.4*  HCT 28.5* 25.1* 26.3* 22.9* 26.4* 26.3*  MCV 100.4* 99.2  --  97.9 98.9 98.1  PLT 211 170  --  146* 190 440   Basic Metabolic Panel: Recent Labs  Lab 12/04/17 0435 12/05/17 1826 12/06/17 0337 12/07/17 0314 12/08/17 0516  NA 140 137 136 138 137  K 4.3 4.8 4.3 4.4 4.4  CL 112* 104 108 108 108  CO2 25 25 23 23 24   GLUCOSE 112* 172* 128* 117* 119*  BUN 27* 40* 42* 43* 36*  CREATININE 1.38* 1.93* 1.90* 1.76* 1.66*  CALCIUM 8.8* 9.0 8.5* 8.5* 8.6*   GFR: Estimated Creatinine Clearance: 33 mL/min (A) (by C-G formula based on SCr of 1.66 mg/dL (H)). Liver Function Tests: No results for input(s): AST, ALT, ALKPHOS, BILITOT, PROT, ALBUMIN in the last 168 hours. No results for input(s): LIPASE, AMYLASE in the last 168 hours. No results for input(s): AMMONIA in the last 168 hours. Coagulation Profile: Recent Labs  Lab 12/03/17 0334 12/05/17 1332 12/06/17 0337 12/07/17 0314 12/08/17 0516  INR 1.27 1.32 1.61 1.74 1.64   Cardiac Enzymes: No results for input(s): CKTOTAL, CKMB, CKMBINDEX, TROPONINI in the last 168 hours. BNP (last 3 results) No results for input(s): PROBNP in the last 8760 hours. HbA1C: No results for input(s): HGBA1C in the last 72 hours. CBG: Recent Labs  Lab 12/06/17 1220  GLUCAP 136*   Lipid Profile: No results for input(s): CHOL, HDL, LDLCALC, TRIG, CHOLHDL, LDLDIRECT in the last 72 hours. Thyroid Function Tests: No results for input(s): TSH, T4TOTAL, FREET4, T3FREE, THYROIDAB in the last 72 hours. Anemia Panel: No results  for input(s): VITAMINB12, FOLATE, FERRITIN, TIBC, IRON, RETICCTPCT in the last 72 hours. Sepsis Labs: No results for input(s): PROCALCITON, LATICACIDVEN in the last 168 hours.  Recent Results (from the past 240 hour(s))  Surgical pcr screen     Status: None   Collection Time: 11/30/17  2:48 AM  Result Value Ref Range Status   MRSA, PCR NEGATIVE NEGATIVE Final   Staphylococcus aureus NEGATIVE NEGATIVE Final    Comment: (NOTE) The Xpert SA Assay (FDA approved for NASAL specimens in patients 1 years of age and older), is one component of a comprehensive surveillance program. It is not intended to diagnose infection nor to guide or monitor treatment. Performed at [facility], [place] [address] [place], [place] [address]  Radiology Studies: Ct Abdomen Pelvis Wo Contrast  Result Date: 12/07/2017 CLINICAL DATA:  Unexplained anemia. Suspected retroperitoneal hemorrhage. Three days postop from left hip arthroplasty. EXAM: CT ABDOMEN AND PELVIS WITHOUT CONTRAST TECHNIQUE: Multidetector CT imaging of the abdomen and pelvis was performed following the standard protocol without IV contrast. COMPARISON:  01/02/2016 FINDINGS: Lower chest: No acute findings. Stable cardiomegaly with pacemaker leads in right heart. Hepatobiliary: No mass visualized on this unenhanced exam. Gallbladder is unremarkable. Pancreas: No mass or inflammatory process visualized on this unenhanced exam. Spleen:  Within normal limits in size. Adrenals/Urinary tract: Prior right nephrectomy. Small fluid attenuation left renal cyst again noted. No evidence of urolithiasis or hydronephrosis. Unremarkable unopacified urinary bladder. Stomach/Bowel: No evidence of obstruction, inflammatory process, or abnormal fluid collections. Vascular/Lymphatic: No pathologically enlarged lymph nodes identified. No evidence of abdominal aortic aneurysm. Aortic atherosclerosis. Reproductive:  No mass or other significant abnormality.  Other: No evidence of retroperitoneal hemorrhage or hemoperitoneum. New soft tissue stranding is seen within the subcutaneous tissues in the proximal left thigh and left lower anterior abdominal wall. No focal hematoma or other fluid collection identified. Musculoskeletal: Left hip arthroplasty is seen in expected position. No evidence of fracture or other suspicious bone lesions. IMPRESSION: Soft tissue stranding within subcutaneous tissues of the proximal left thigh and left lower anterior abdominal wall. No evidence of focal hematoma or other fluid collection. No evidence of retroperitoneal hemorrhage or hemoperitoneum. Electronically Signed   By: Earle Gell M.D.   On: 12/07/2017 10:08        Scheduled Meds: . bisacodyl  10 mg Rectal Once  . clonazePAM  2 mg Oral QHS  . docusate sodium  100 mg Oral BID  . finasteride  5 mg Oral Daily  . galantamine  8 mg Oral BID  . hydrocortisone cream   Topical BID  . ipratropium  2 spray Each Nare BID  . metoprolol tartrate  25 mg Oral BID  . multivitamin with minerals  1 tablet Oral Daily  . pantoprazole  40 mg Oral BID  . polyethylene glycol  17 g Oral BID  . senna  1 tablet Oral BID  . warfarin  6 mg Oral ONCE-1800  . Warfarin - Pharmacist Dosing Inpatient   Does not apply q1800   Continuous Infusions: . sodium chloride 50 mL (12/08/17 0857)  . sodium chloride    . lactated ringers 10 mL/hr at 12/04/17 1928     LOS: 9 days    Time spent:35 minutes.     Elmarie Shiley, MD [facility] Pager [phone]  If 7PM-7AM, please contact night-coverage [email or web address] Password [email or web address] 12/08/2017, 2:42 PM

## 2017-12-08 NOTE — Social Work (Signed)
CSW met with patient at bedside to discuss SNF offers. Pt indicated that wife would be at hospital shortly. CSW then contacted spouse who indicated that she has accepted SNF bed from [facility].  CSW then confirmed SNF bed with admission staff.  CSW then called Health team Adv. And left message requesting them to initiate Insurance Auth.  CSW will f/u for disposition.  Elissa Hefty, LCSW Clinical Social Worker [phone]

## 2017-12-09 DIAGNOSIS — Z9181 History of falling: Secondary | ICD-10-CM | POA: Diagnosis not present

## 2017-12-09 DIAGNOSIS — Z7901 Long term (current) use of anticoagulants: Secondary | ICD-10-CM | POA: Diagnosis not present

## 2017-12-09 DIAGNOSIS — N400 Enlarged prostate without lower urinary tract symptoms: Secondary | ICD-10-CM | POA: Diagnosis not present

## 2017-12-09 DIAGNOSIS — Z96642 Presence of left artificial hip joint: Secondary | ICD-10-CM | POA: Diagnosis not present

## 2017-12-09 DIAGNOSIS — N183 Chronic kidney disease, stage 3 (moderate): Secondary | ICD-10-CM | POA: Diagnosis not present

## 2017-12-09 DIAGNOSIS — R2689 Other abnormalities of gait and mobility: Secondary | ICD-10-CM | POA: Diagnosis not present

## 2017-12-09 DIAGNOSIS — F419 Anxiety disorder, unspecified: Secondary | ICD-10-CM | POA: Diagnosis not present

## 2017-12-09 DIAGNOSIS — H04129 Dry eye syndrome of unspecified lacrimal gland: Secondary | ICD-10-CM | POA: Diagnosis not present

## 2017-12-09 DIAGNOSIS — Z7401 Bed confinement status: Secondary | ICD-10-CM | POA: Diagnosis not present

## 2017-12-09 DIAGNOSIS — R52 Pain, unspecified: Secondary | ICD-10-CM | POA: Diagnosis not present

## 2017-12-09 DIAGNOSIS — I4891 Unspecified atrial fibrillation: Secondary | ICD-10-CM | POA: Diagnosis not present

## 2017-12-09 DIAGNOSIS — S72002A Fracture of unspecified part of neck of left femur, initial encounter for closed fracture: Secondary | ICD-10-CM | POA: Diagnosis not present

## 2017-12-09 DIAGNOSIS — I10 Essential (primary) hypertension: Secondary | ICD-10-CM | POA: Diagnosis not present

## 2017-12-09 DIAGNOSIS — M6281 Muscle weakness (generalized): Secondary | ICD-10-CM | POA: Diagnosis not present

## 2017-12-09 DIAGNOSIS — S72002D Fracture of unspecified part of neck of left femur, subsequent encounter for closed fracture with routine healing: Secondary | ICD-10-CM | POA: Diagnosis not present

## 2017-12-09 DIAGNOSIS — R600 Localized edema: Secondary | ICD-10-CM | POA: Diagnosis not present

## 2017-12-09 DIAGNOSIS — I455 Other specified heart block: Secondary | ICD-10-CM | POA: Diagnosis not present

## 2017-12-09 DIAGNOSIS — Z950 Presence of cardiac pacemaker: Secondary | ICD-10-CM | POA: Diagnosis not present

## 2017-12-09 DIAGNOSIS — G4733 Obstructive sleep apnea (adult) (pediatric): Secondary | ICD-10-CM | POA: Diagnosis not present

## 2017-12-09 DIAGNOSIS — K5900 Constipation, unspecified: Secondary | ICD-10-CM | POA: Diagnosis not present

## 2017-12-09 DIAGNOSIS — F0390 Unspecified dementia without behavioral disturbance: Secondary | ICD-10-CM | POA: Diagnosis not present

## 2017-12-09 DIAGNOSIS — Z471 Aftercare following joint replacement surgery: Secondary | ICD-10-CM | POA: Diagnosis not present

## 2017-12-09 DIAGNOSIS — S72032D Displaced midcervical fracture of left femur, subsequent encounter for closed fracture with routine healing: Secondary | ICD-10-CM | POA: Diagnosis not present

## 2017-12-09 DIAGNOSIS — K219 Gastro-esophageal reflux disease without esophagitis: Secondary | ICD-10-CM | POA: Diagnosis not present

## 2017-12-09 DIAGNOSIS — S7292XD Unspecified fracture of left femur, subsequent encounter for closed fracture with routine healing: Secondary | ICD-10-CM | POA: Diagnosis not present

## 2017-12-09 DIAGNOSIS — D62 Acute posthemorrhagic anemia: Secondary | ICD-10-CM | POA: Diagnosis not present

## 2017-12-09 DIAGNOSIS — Z111 Encounter for screening for respiratory tuberculosis: Secondary | ICD-10-CM | POA: Diagnosis not present

## 2017-12-09 DIAGNOSIS — M2550 Pain in unspecified joint: Secondary | ICD-10-CM | POA: Diagnosis not present

## 2017-12-09 LAB — BASIC METABOLIC PANEL
Anion gap: 6 (ref 5–15)
BUN: 31 mg/dL — ABNORMAL HIGH (ref 6–20)
CALCIUM: 8.9 mg/dL (ref 8.9–10.3)
CO2: 23 mmol/L (ref 22–32)
CREATININE: 1.52 mg/dL — AB (ref 0.61–1.24)
Chloride: 109 mmol/L (ref 101–111)
GFR, EST AFRICAN AMERICAN: 46 mL/min — AB (ref >60)
GFR, EST NON AFRICAN AMERICAN: 40 mL/min — AB (ref >60)
GLUCOSE: 108 mg/dL — AB (ref 65–99)
Potassium: 5 mmol/L (ref 3.5–5.1)
Sodium: 138 mmol/L (ref 135–145)

## 2017-12-09 LAB — PROTIME-INR
INR: 1.78
PROTHROMBIN TIME: 20.6 s — AB (ref 11.4–15.2)

## 2017-12-09 LAB — HEPATIC FUNCTION PANEL
ALT: 25 U/L (ref 17–63)
AST: 36 U/L (ref 15–41)
Albumin: 2.8 g/dL — ABNORMAL LOW (ref 3.5–5.0)
Alkaline Phosphatase: 118 U/L (ref 38–126)
BILIRUBIN DIRECT: 0.3 mg/dL (ref 0.1–0.5)
BILIRUBIN INDIRECT: 1 mg/dL — AB (ref 0.3–0.9)
BILIRUBIN TOTAL: 1.3 mg/dL — AB (ref 0.3–1.2)
Total Protein: 5.7 g/dL — ABNORMAL LOW (ref 6.5–8.1)

## 2017-12-09 LAB — CBC
HCT: 27.5 % — ABNORMAL LOW (ref 39.0–52.0)
Hemoglobin: 8.7 g/dL — ABNORMAL LOW (ref 13.0–17.0)
MCH: 31.8 pg (ref 26.0–34.0)
MCHC: 31.6 g/dL (ref 30.0–36.0)
MCV: 100.4 fL — ABNORMAL HIGH (ref 78.0–100.0)
Platelets: 213 10*3/uL (ref 150–400)
RBC: 2.74 MIL/uL — ABNORMAL LOW (ref 4.22–5.81)
RDW: 15.1 % (ref 11.5–15.5)
WBC: 7.6 10*3/uL (ref 4.0–10.5)

## 2017-12-09 MED ORDER — NEPRO/CARBSTEADY PO LIQD: 237.0000 mL | Freq: Two times a day (BID) | ORAL | 0 refills | Status: DC

## 2017-12-09 MED ORDER — SENNA 8.6 MG PO TABS: 1.0000 | ORAL_TABLET | Freq: Two times a day (BID) | ORAL | 0 refills | Status: DC

## 2017-12-09 MED ORDER — FERROUS SULFATE 325 (65 FE) MG PO TABS: 325.0000 mg | ORAL_TABLET | Freq: Two times a day (BID) | ORAL | 3 refills | Status: DC

## 2017-12-09 MED ORDER — ENOXAPARIN SODIUM 80 MG/0.8ML ~~LOC~~ SOLN: 1.0000 mg/kg | SUBCUTANEOUS | 0 refills | Status: DC

## 2017-12-09 MED ORDER — HYDROCODONE-ACETAMINOPHEN 5-325 MG PO TABS: 1.0000 | ORAL_TABLET | ORAL | 0 refills | Status: DC | PRN

## 2017-12-09 MED ORDER — WARFARIN SODIUM 6 MG PO TABS
6.0000 mg | ORAL_TABLET | Freq: Once | ORAL | Status: DC
  Filled 2017-12-09: qty 1

## 2017-12-09 MED ORDER — NEPRO/CARBSTEADY PO LIQD
237.0000 mL | Freq: Two times a day (BID) | ORAL | Status: DC
  Filled 2017-12-09 (×3): qty 237

## 2017-12-09 MED ORDER — FERROUS SULFATE 325 (65 FE) MG PO TABS: 325.0000 mg | ORAL_TABLET | Freq: Two times a day (BID) | ORAL | Status: DC

## 2017-12-09 MED ORDER — WARFARIN SODIUM 6 MG PO TABS: 6.0000 mg | ORAL_TABLET | Freq: Once | ORAL | 0 refills | Status: DC

## 2017-12-09 MED ORDER — WARFARIN SODIUM 6 MG PO TABS: 6.0000 mg | ORAL_TABLET | Freq: Every day | ORAL | 0 refills | Status: DC

## 2017-12-09 MED ORDER — POLYETHYLENE GLYCOL 3350 17 G PO PACK: 17.0000 g | PACK | Freq: Two times a day (BID) | ORAL | 0 refills | Status: DC

## 2017-12-09 MED ORDER — PANTOPRAZOLE SODIUM 40 MG PO TBEC: 40.0000 mg | DELAYED_RELEASE_TABLET | Freq: Two times a day (BID) | ORAL | 0 refills | Status: DC

## 2017-12-09 MED ORDER — DOCUSATE SODIUM 100 MG PO CAPS: 100.0000 mg | ORAL_CAPSULE | Freq: Two times a day (BID) | ORAL | 0 refills | Status: DC

## 2017-12-09 NOTE — Progress Notes (Signed)
Patient is set to discharge to  [facility] SNF today. Patient & spouse aware. Discharge packet given to [facility]. [facility] called for transport.   Mahamad Burgess Estelle Clinical Social Worker [phone]

## 2017-12-09 NOTE — Discharge Summary (Signed)
Physician Discharge Summary  [name] WCH:[id number] DOB: Sep 22, 1929 DOA: 11/29/2017  PCP: [name], MD  Admit date: 11/29/2017 Discharge date: 12/09/2017  Admitted From: Home  Disposition:  SNF  Recommendations for Outpatient Follow-up:  1. Follow up with PCP in 1-2 weeks 2. Please obtain BMP/CBC in 24--48 hours.  3. Please check INR in 24 hours, if INR more than 1.8 ok to discontinue Lovenox.  4. Continue with coumadin, adjust dose as needed.  5. Resume lasix in 24--48 hours if renal function is stable.     Discharge Condition; stable.  CODE STATUS: full code.  Diet recommendation: Heart Healthy   Brief/Interim Summary: 84 year old married male, physically active and independent, PMH of complete heart block status post permanent pacemaker, last generator changed 2013 (Dr. Rayann Heman, EP cardiology), dementia, GERD, HTN, OSA on CPAP, permanent A. fib on Coumadin, transitional cell carcinoma status post right nephrectomy, stage III chronic kidney disease, stroke/TIA, frequent falls (3 this year) missed a step and fell on a concrete surface 5 days PTA leading to left hip pain and fracture, seen at orthopedic office and sent to the hospital for admission.Orthopedics consulted. Anticoagulation reversed. Status post left total hip arthroplasty, anterior approach 6/3. CIR consulted.   Assessment & Plan:   Principal Problem:   Closed left hip fracture (HCC) Active Problems:   Obstructive sleep apnea   FIBRILLATION, ATRIAL   Long term ([place]) use of anticoagulants   Pacemaker-Medtronic   Lower leg edema   Fall   Essential hypertension   Stroke (cerebrum) (HCC)   BPH (benign prostatic hyperplasia)   Dementia   CKD (chronic kidney disease), stage III (HCC)   Closed right hip fracture, initial encounter ([place])   Diarrhea   Closed displaced fracture of left femoral neck (HCC)   Status post total replacement of left hip   Benign essential HTN   History of CVA  (cerebrovascular accident)   Cardiac pacemaker in situ   Postoperative pain   Acute blood loss anemia  1-Left hip fracture;  After a mechanical fall. Unable to have sx until 6-03 due to scheduling issues.  Stable, plan to discharge with wound vac.  Needs to follow up with [facility] in 1 week.  Needs to work with PT   2-Acute blood loss anemia;  Post Op. ?  Surgical site no hematoma  CT abdomen pelvis negative for retroperitoneal bleed.  Received total 2 units PRBC> hb still at 8.4 ? Hemodilution. No significant elevation of bilirubin.  Vitals stable. improving, hb increase to 8.7. started ferrous sulfate, his stool has been brown color. No melena.   3-AKI on CKD stage III;  Status post right nephrectomy for TCC/stage III chronic kidney disease Prior admission cr 1.8. Cr 1.4--1.5.  Suspect related to hypotension.  Improved with IV fluids.  Cr decrease to 1.5. Peak to 1.9 during admission  If renal function continue to be stable in 48 hours, consider resuming lasix.   4-Permanent  A fib;  On coumadin.  Lovenox held yesterday due to concern for bleeding.  Will check for occult blood today, if negative will resume Lovenox.  Will resume lovenox, no clear evidence of active bleeding. When nurse place suppository there were no blood or black stool. I am worry that INR drop today and his risk for stroke.   Status post PPM for complete heart block Stable. TTE 5/30 shows LVEF 60-65%. EP Cardiology interrogated PPM prior to surgery and noted without problems.  Dementia; mild; stable.   LE edema;  Doppler negative for DVT   Hypotension; 6-04; hold lasix.  Received IV bolus.  Resolved.      Discharge Diagnoses:  Principal Problem:   Closed left hip fracture ([place]) Active Problems:   Obstructive sleep apnea   FIBRILLATION, ATRIAL   Long term ([place]) use of anticoagulants   Pacemaker-Medtronic   Lower leg edema   Fall   Essential hypertension   Stroke (cerebrum)  (HCC)   BPH (benign prostatic hyperplasia)   Dementia   CKD (chronic kidney disease), stage III (HCC)   Closed right hip fracture, initial encounter ([place])   Diarrhea   Closed displaced fracture of left femoral neck (HCC)   Status post total replacement of left hip   Benign essential HTN   History of CVA (cerebrovascular accident)   Cardiac pacemaker in situ   Postoperative pain   Acute blood loss anemia    Discharge Instructions  Discharge Instructions    Diet - low sodium heart healthy   Complete by:  As directed    Increase activity slowly   Complete by:  As directed      Allergies as of 12/09/2017   No Known Allergies     Medication List    STOP taking these medications   aspirin 81 MG tablet   furosemide 40 MG tablet Commonly known as:  LASIX     TAKE these medications   acetaminophen 325 MG tablet Commonly known as:  TYLENOL Take 650 mg by mouth every 4 (four) hours as needed for mild pain or fever.   clonazePAM 2 MG tablet Commonly known as:  KLONOPIN Take 1 tablet (2 mg total) by mouth at bedtime.   cycloSPORINE 0.05 % ophthalmic emulsion Commonly known as:  RESTASIS Place 1 drop into both eyes 2 (two) times daily.   docusate sodium 100 MG capsule Commonly known as:  COLACE Take 1 capsule (100 mg total) by mouth 2 (two) times daily.   doxazosin 2 MG tablet Commonly known as:  CARDURA Take 2 mg by mouth daily.   enoxaparin 80 MG/0.8ML injection Commonly known as:  LOVENOX Inject 0.8 mLs (80 mg total) into the skin daily.   feeding supplement (NEPRO CARB STEADY) Liqd Take 237 mLs by mouth 2 (two) times daily between meals.   ferrous sulfate 325 (65 FE) MG tablet Take 1 tablet (325 mg total) by mouth 2 (two) times daily with a meal.   finasteride 5 MG tablet Commonly known as:  PROSCAR Take 5 mg by mouth daily.   galantamine 8 MG tablet Commonly known as:  RAZADYNE Take 8 mg by mouth 2 (two) times daily.   glucosamine-chondroitin 500-400  MG tablet Take 1 tablet by mouth 2 (two) times daily.   HYDROcodone-acetaminophen 5-325 MG tablet Commonly known as:  NORCO/VICODIN Take 1-2 tablets by mouth every 4 (four) hours as needed for moderate pain (pain score 4-6).   ipratropium 0.06 % nasal spray Commonly known as:  ATROVENT Place 2 sprays into both nostrils 2 (two) times daily.   metoprolol tartrate 25 MG tablet Commonly known as:  LOPRESSOR Take 25 mg by mouth 2 (two) times daily.   multivitamin capsule Take 1 capsule by mouth daily.   pantoprazole 40 MG tablet Commonly known as:  PROTONIX Take 1 tablet (40 mg total) by mouth 2 (two) times daily.   polyethylene glycol packet Commonly known as:  MIRALAX / GLYCOLAX Take 17 g by mouth 2 (two) times daily.   senna 8.6 MG Tabs tablet Commonly known  asDonavan [place]et Take 1 tablet (8.6 mg total) by mouth 2 (two) times daily.   warfarin 6 MG tablet Commonly known as:  COUMADIN Take as directed. If you are unsure how to take this medication, talk to your nurse or doctor. Original instructions:  Take 1 tablet (6 mg total) by mouth one time only at 6 PM. What changed:    medication strength  how much to take  how to take this  when to take this  additional instructions       Contact information for follow-up providers    Rod Can, MD Follow up on 12/12/2017.   Specialty:  Orthopedic Surgery Why:  For wound re-check Contact information: [address] [place] [address] [phone]            Contact information for after-discharge care    Destination    [facility] .   Service:  Skilled Nursing Contact information: [address] [place] [phone]                 No Known Allergies  Consultations:  Ortho  GI    Procedures/Studies: Ct Abdomen Pelvis Wo Contrast  Result Date: 12/07/2017 CLINICAL DATA:  Unexplained anemia. Suspected retroperitoneal hemorrhage. Three days  postop from left hip arthroplasty. EXAM: CT ABDOMEN AND PELVIS WITHOUT CONTRAST TECHNIQUE: Multidetector CT imaging of the abdomen and pelvis was performed following the standard protocol without IV contrast. COMPARISON:  01/02/2016 FINDINGS: Lower chest: No acute findings. Stable cardiomegaly with pacemaker leads in right heart. Hepatobiliary: No mass visualized on this unenhanced exam. Gallbladder is unremarkable. Pancreas: No mass or inflammatory process visualized on this unenhanced exam. Spleen:  Within normal limits in size. Adrenals/Urinary tract: Prior right nephrectomy. Small fluid attenuation left renal cyst again noted. No evidence of urolithiasis or hydronephrosis. Unremarkable unopacified urinary bladder. Stomach/Bowel: No evidence of obstruction, inflammatory process, or abnormal fluid collections. Vascular/Lymphatic: No pathologically enlarged lymph nodes identified. No evidence of abdominal aortic aneurysm. Aortic atherosclerosis. Reproductive:  No mass or other significant abnormality. Other: No evidence of retroperitoneal hemorrhage or hemoperitoneum. New soft tissue stranding is seen within the subcutaneous tissues in the proximal left thigh and left lower anterior abdominal wall. No focal hematoma or other fluid collection identified. Musculoskeletal: Left hip arthroplasty is seen in expected position. No evidence of fracture or other suspicious bone lesions. IMPRESSION: Soft tissue stranding within subcutaneous tissues of the proximal left thigh and left lower anterior abdominal wall. No evidence of focal hematoma or other fluid collection. No evidence of retroperitoneal hemorrhage or hemoperitoneum. Electronically Signed   By: Earle Gell M.D.   On: 12/07/2017 10:08   Dg Chest 2 View  Result Date: 11/29/2017 CLINICAL DATA:  Fall 6 days ago injuring left hip. EXAM: CHEST - 2 VIEW COMPARISON:  May 17, 2017 FINDINGS: Stable cardiomegaly. The hila, mediastinum, right-sided pacemaker, and  pleura are normal. IMPRESSION: No active cardiopulmonary disease. Electronically Signed   By: Dorise Bullion III M.D   On: 11/29/2017 20:54   Pelvis Portable  Result Date: 12/05/2017 CLINICAL DATA:  Left total hip replacement. EXAM: PORTABLE PELVIS 1-2 VIEWS COMPARISON:  Left hip x-rays dated Nov 29, 2017. FINDINGS: The left hip demonstrates a total arthroplasty without evidence of hardware failure or complication. Single cerclage wire around the greater trochanter. There is expected intra-articular air. There is no fracture or dislocation. The alignment is anatomic. Post-surgical changes noted in the surrounding soft tissues. The right hip is unremarkable. IMPRESSION:  1. Interval left total hip arthroplasty without evidence of acute postoperative complication. Electronically Signed   By: Titus Dubin M.D.   On: 12/05/2017 00:11   Dg C-arm 1-60 Min  Result Date: 12/05/2017 CLINICAL DATA:  Left hip arthroplasty. EXAM: OPERATIVE LEFT HIP (WITH PELVIS IF PERFORMED) 3 VIEWS TECHNIQUE: Fluoroscopic spot image(s) were submitted for interpretation post-operatively. COMPARISON:  Left hip x-rays dated Nov 29, 2017. FINDINGS: Intraoperative x-rays demonstrating interval left total hip arthroplasty with single cerclage wire around the greater trochanter. Components are well aligned. IMPRESSION: Interval left total hip arthroplasty. FLUOROSCOPY TIME:  28 seconds. C-arm fluoroscopic images were obtained intraoperatively and submitted for post operative interpretation. Electronically Signed   By: Titus Dubin M.D.   On: 12/05/2017 00:10   Dg C-arm 1-60 Min  Result Date: 12/05/2017 CLINICAL DATA:  Left hip arthroplasty. EXAM: OPERATIVE LEFT HIP (WITH PELVIS IF PERFORMED) 3 VIEWS TECHNIQUE: Fluoroscopic spot image(s) were submitted for interpretation post-operatively. COMPARISON:  Left hip x-rays dated Nov 29, 2017. FINDINGS: Intraoperative x-rays demonstrating interval left total hip arthroplasty with single  cerclage wire around the greater trochanter. Components are well aligned. IMPRESSION: Interval left total hip arthroplasty. FLUOROSCOPY TIME:  28 seconds. C-arm fluoroscopic images were obtained intraoperatively and submitted for post operative interpretation. Electronically Signed   By: Titus Dubin M.D.   On: 12/05/2017 00:10   Dg Hip Port Unilat With Pelvis 1v Left  Result Date: 11/29/2017 CLINICAL DATA:  Preoperative left hip radiograph for left femoral neck fracture. EXAM: DG HIP (WITH OR WITHOUT PELVIS) 1V PORT LEFT COMPARISON:  None. FINDINGS: There is suggestion of a minimally displaced transcervical fracture through the left femoral neck, which may extend to the trochanters. The left femoral head remains seated at the acetabulum. The right hip joint is unremarkable. Degenerative change is noted at the lower lumbar spine. The sacroiliac joints are grossly unremarkable. The visualized bowel gas pattern is grossly unremarkable. IMPRESSION: Minimally displaced transcervical fracture through the left femoral neck, which may extend to the trochanters. Electronically Signed   By: Garald Balding M.D.   On: 11/29/2017 23:29   Dg Hip Operative Unilat With Pelvis Left  Result Date: 12/05/2017 CLINICAL DATA:  Left hip arthroplasty. EXAM: OPERATIVE LEFT HIP (WITH PELVIS IF PERFORMED) 3 VIEWS TECHNIQUE: Fluoroscopic spot image(s) were submitted for interpretation post-operatively. COMPARISON:  Left hip x-rays dated Nov 29, 2017. FINDINGS: Intraoperative x-rays demonstrating interval left total hip arthroplasty with single cerclage wire around the greater trochanter. Components are well aligned. IMPRESSION: Interval left total hip arthroplasty. FLUOROSCOPY TIME:  28 seconds. C-arm fluoroscopic images were obtained intraoperatively and submitted for post operative interpretation. Electronically Signed   By: Titus Dubin M.D.   On: 12/05/2017 00:10       Subjective: He is doing well, denies dyspnea.   Had large BM yesterday, no black stool   Discharge Exam: Vitals:   12/08/17 2003 12/09/17 0607  BP: (!) 142/62 (!) 146/63  Pulse: 78 69  Resp:    Temp: 98.5 F (36.9 C) 98.3 F (36.8 C)  SpO2: 100% 98%   Vitals:   12/08/17 0800 12/08/17 1447 12/08/17 2003 12/09/17 0607  BP: (!) 123/56 123/60 (!) 142/62 (!) 146/63  Pulse: 70 70 78 69  Resp: 16 18    Temp:  98.5 F (36.9 C) 98.5 F (36.9 C) 98.3 F (36.8 C)  TempSrc:  Oral Oral Oral  SpO2:  100% 100% 98%  Weight:      Height:  General: Pt is alert, awake, not in acute distress Cardiovascular: RRR, S1/S2 +, no rubs, no gallops Respiratory: CTA bilaterally, no wheezing, no rhonchi Abdominal: Soft, NT, ND, bowel sounds + Extremities: no edema, no cyanosis, wound vac in place. Mild edema    The results of significant diagnostics from this hospitalization (including imaging, microbiology, ancillary and laboratory) are listed below for reference.     Microbiology: Recent Results (from the past 240 hour(s))  Surgical pcr screen     Status: None   Collection Time: 11/30/17  2:48 AM  Result Value Ref Range Status   MRSA, PCR NEGATIVE NEGATIVE Final   Staphylococcus aureus NEGATIVE NEGATIVE Final    Comment: (NOTE) The Xpert SA Assay (FDA approved for NASAL specimens in patients 42 years of age and older), is one component of a comprehensive surveillance program. It is not intended to diagnose infection nor to guide or monitor treatment. Performed at [facility], [place] [address] [place], [place] [address]      Labs: BNP (last 3 results) Recent Labs    11/29/17 2200  BNP 401.0*   Basic Metabolic Panel: Recent Labs  Lab 12/05/17 1826 12/06/17 0337 12/07/17 0314 12/08/17 0516 12/09/17 0325  NA 137 136 138 137 138  K 4.8 4.3 4.4 4.4 5.0  CL 104 108 108 108 109  CO2 25 23 23 24 23   GLUCOSE 172* 128* 117* 119* 108*  BUN 40* 42* 43* 36* 31*  CREATININE 1.93* 1.90* 1.76* 1.66* 1.52*  CALCIUM  9.0 8.5* 8.5* 8.6* 8.9   Liver Function Tests: Recent Labs  Lab 12/09/17 0325  AST 36  ALT 25  ALKPHOS 118  BILITOT 1.3*  PROT 5.7*  ALBUMIN 2.8*   No results for input(s): LIPASE, AMYLASE in the last 168 hours. No results for input(s): AMMONIA in the last 168 hours. CBC: Recent Labs  Lab 12/06/17 0337 12/06/17 1433 12/07/17 0314 12/07/17 0711 12/08/17 0516 12/09/17 0325  WBC 7.0  --  7.2 8.8 6.5 7.6  HGB 8.0* 8.4* 7.3* 8.2* 8.4* 8.7*  HCT 25.1* 26.3* 22.9* 26.4* 26.3* 27.5*  MCV 99.2  --  97.9 98.9 98.1 100.4*  PLT 170  --  146* 190 175 213   Cardiac Enzymes: No results for input(s): CKTOTAL, CKMB, CKMBINDEX, TROPONINI in the last 168 hours. BNP: Invalid input(s): POCBNP CBG: Recent Labs  Lab 12/06/17 1220  GLUCAP 136*   D-Dimer No results for input(s): DDIMER in the last 72 hours. Hgb A1c No results for input(s): HGBA1C in the last 72 hours. Lipid Profile No results for input(s): CHOL, HDL, LDLCALC, TRIG, CHOLHDL, LDLDIRECT in the last 72 hours. Thyroid function studies No results for input(s): TSH, T4TOTAL, T3FREE, THYROIDAB in the last 72 hours.  Invalid input(s): FREET3 Anemia work up No results for input(s): VITAMINB12, FOLATE, FERRITIN, TIBC, IRON, RETICCTPCT in the last 72 hours. Urinalysis    Component Value Date/Time   COLORURINE AMBER (A) 01/02/2016 1128   APPEARANCEUR CLOUDY (A) 01/02/2016 1128   LABSPEC 1.026 01/02/2016 1128   PHURINE 5.5 01/02/2016 1128   GLUCOSEU NEGATIVE 01/02/2016 1128   HGBUR MODERATE (A) 01/02/2016 1128   BILIRUBINUR SMALL (A) 01/02/2016 1128   KETONESUR 15 (A) 01/02/2016 1128   PROTEINUR 100 (A) 01/02/2016 1128   NITRITE NEGATIVE 01/02/2016 1128   LEUKOCYTESUR MODERATE (A) 01/02/2016 1128   Sepsis Labs Invalid input(s): PROCALCITONIN,  WBC,  LACTICIDVEN Microbiology Recent Results (from the past 240 hour(s))  Surgical pcr screen     Status:  None   Collection Time: 11/30/17  2:48 AM  Result Value Ref Range  Status   MRSA, PCR NEGATIVE NEGATIVE Final   Staphylococcus aureus NEGATIVE NEGATIVE Final    Comment: (NOTE) The Xpert SA Assay (FDA approved for NASAL specimens in patients 70 years of age and older), is one component of a comprehensive surveillance program. It is not intended to diagnose infection nor to guide or monitor treatment. Performed at [facility], [place] [address], [place], [place] [address]      Time coordinating discharge: 35 minutes.   SIGNED:   Elmarie Shiley, MD  [facility] 12/09/2017, 11:10 AM Pager [phone]  If 7PM-7AM, please contact night-coverage [email or web address] Password [email or web address]

## 2017-12-09 NOTE — Progress Notes (Signed)
ANTICOAGULATION CONSULT NOTE  Pharmacy Consult:  Coumadin, adding Lovenox Indication: atrial fibrillation  No Known Allergies  Patient Measurements: Height: 5\' 10"  (177.8 cm) Weight: 180 lb (81.6 kg) IBW/kg (Calculated) : 73  Vital Signs: Temp: 98.3 F (36.8 C) (06/08 0607) Temp Source: Oral (06/08 0607) BP: 146/63 (06/08 0607) Pulse Rate: 69 (06/08 0607)  Labs: Recent Labs    12/07/17 0314 12/07/17 0711 12/08/17 0516 12/09/17 0325  HGB 7.3* 8.2* 8.4* 8.7*  HCT 22.9* 26.4* 26.3* 27.5*  PLT 146* 190 175 213  LABPROT 20.2*  --  19.3* 20.6*  INR 1.74  --  1.64 1.78  CREATININE 1.76*  --  1.66* 1.52*    Estimated Creatinine Clearance: 36 mL/min (A) (by C-G formula based on SCr of 1.52 mg/dL (H)).   Assessment: [name] on Coumadin PTA for Afib (CHADsVASc = 5).  He received Vitamin K x3 doses and then started on IV heparin bridge.  S/p hip surgery on 12/04/17 and Coumadin resumed post-op.  Lovenox bridge discontinued on 12/07/17, restarted on 6/7.  INR sub-therapeutic, trended up slightly today to 1.78. Slow increase likely in part due to vitamin K resistane.  No bleeding reported; however, patient did receive transfusion d/t ABLA. GI consulted and agree no GIB and no plan for endoscopic evaluation. Scr remains stable at 1.52 today (CrCl ~36 mL/min) - will continue enoxaparin at every 24 hours given age, Scr trend, and Hgb.   Home Coumadin dose:  5mg  PO daily except 2.5mg  on Monday.     Goal of Therapy:  INR 2 - 3 Monitor platelets by anticoagulation protocol: Yes    Plan:  Coumadin 6 mg PO today (higher dose due to decreasing INR in setting of recent vitamin K) Continue enoxaparin 80 mg every 24 hours Daily PT / INR, CBC every 72 hours Monitor for s/sx of bleeding  Doylene Canard, PharmD Clinical Pharmacist  Pager: [phone] Phone: [phone] 12/09/2017, 10:31 AM

## 2017-12-09 NOTE — Progress Notes (Signed)
Subjective: 5 Days Post-Op Procedure(s) (LRB): TOTAL HIP ARTHROPLASTY ANTERIOR APPROACH WITH CABLE (Left) Patient reports pain as mild.  He has been OOB walking daily.  Awaiting snf.  Objective: Vital signs in last 24 hours: Temp:  [98.3 F (36.8 C)-98.5 F (36.9 C)] 98.3 F (36.8 C) (06/08 0607) Pulse Rate:  [69-78] 69 (06/08 0607) Resp:  [18] 18 (06/07 1447) BP: (123-146)/(60-63) 146/63 (06/08 0607) SpO2:  [98 %-100 %] 98 % (06/08 0607)  Intake/Output from previous day: 06/07 0701 - 06/08 0700 In: 540 [P.O.:540] Out: 1050 [Urine:1050] Intake/Output this shift: No intake/output data recorded.  Recent Labs    12/06/17 1433 12/07/17 0314 12/07/17 0711 12/08/17 0516 12/09/17 0325  HGB 8.4* 7.3* 8.2* 8.4* 8.7*   Recent Labs    12/08/17 0516 12/09/17 0325  WBC 6.5 7.6  RBC 2.68* 2.74*  HCT 26.3* 27.5*  PLT 175 213   Recent Labs    12/08/17 0516 12/09/17 0325  NA 137 138  K 4.4 5.0  CL 108 109  CO2 24 23  BUN 36* 31*  CREATININE 1.66* 1.52*  GLUCOSE 119* 108*  CALCIUM 8.6* 8.9   Recent Labs    12/08/17 0516 12/09/17 0325  INR 1.64 1.78    PE:  wn wd man in nad.  L hip dressed and dry with prevena in place.  NVI at L LE.    Assessment/Plan: 5 Days Post-Op Procedure(s) (LRB): TOTAL HIP ARTHROPLASTY ANTERIOR APPROACH WITH CABLE (Left) Continue wbat.  PT, OT.  snf placement ok when bed available.    Wylene Simmer 12/09/2017, 9:20 AM

## 2017-12-10 LAB — TYPE AND SCREEN
ABO/RH(D): O POS
Antibody Screen: NEGATIVE
UNIT DIVISION: 0
Unit division: 0
Unit division: 0

## 2017-12-10 LAB — BPAM RBC
BLOOD PRODUCT EXPIRATION DATE: 201907052359
BLOOD PRODUCT EXPIRATION DATE: 201907052359
Blood Product Expiration Date: 201907052359
ISSUE DATE / TIME: 201906052142
ISSUE DATE / TIME: 201906061129
UNIT TYPE AND RH: 5100
Unit Type and Rh: 5100
Unit Type and Rh: 5100

## 2017-12-11 ENCOUNTER — Encounter (HOSPITAL_COMMUNITY): Payer: Self-pay | Admitting: Orthopedic Surgery

## 2017-12-11 DIAGNOSIS — N183 Chronic kidney disease, stage 3 (moderate): Secondary | ICD-10-CM | POA: Diagnosis not present

## 2017-12-11 DIAGNOSIS — S72002A Fracture of unspecified part of neck of left femur, initial encounter for closed fracture: Secondary | ICD-10-CM | POA: Diagnosis not present

## 2017-12-11 DIAGNOSIS — M6281 Muscle weakness (generalized): Secondary | ICD-10-CM | POA: Diagnosis not present

## 2017-12-11 DIAGNOSIS — K219 Gastro-esophageal reflux disease without esophagitis: Secondary | ICD-10-CM | POA: Diagnosis not present

## 2017-12-11 DIAGNOSIS — I4891 Unspecified atrial fibrillation: Secondary | ICD-10-CM | POA: Diagnosis not present

## 2017-12-11 DIAGNOSIS — R600 Localized edema: Secondary | ICD-10-CM | POA: Diagnosis not present

## 2017-12-11 DIAGNOSIS — G4733 Obstructive sleep apnea (adult) (pediatric): Secondary | ICD-10-CM | POA: Diagnosis not present

## 2017-12-11 DIAGNOSIS — Z96642 Presence of left artificial hip joint: Secondary | ICD-10-CM | POA: Diagnosis not present

## 2017-12-11 DIAGNOSIS — D62 Acute posthemorrhagic anemia: Secondary | ICD-10-CM | POA: Diagnosis not present

## 2017-12-11 DIAGNOSIS — N400 Enlarged prostate without lower urinary tract symptoms: Secondary | ICD-10-CM | POA: Diagnosis not present

## 2017-12-11 DIAGNOSIS — I455 Other specified heart block: Secondary | ICD-10-CM | POA: Diagnosis not present

## 2017-12-11 DIAGNOSIS — I10 Essential (primary) hypertension: Secondary | ICD-10-CM | POA: Diagnosis not present

## 2017-12-13 ENCOUNTER — Other Ambulatory Visit: Payer: Self-pay | Admitting: Internal Medicine

## 2017-12-13 DIAGNOSIS — M6281 Muscle weakness (generalized): Secondary | ICD-10-CM | POA: Diagnosis not present

## 2017-12-13 DIAGNOSIS — G4733 Obstructive sleep apnea (adult) (pediatric): Secondary | ICD-10-CM | POA: Diagnosis not present

## 2017-12-13 DIAGNOSIS — I4891 Unspecified atrial fibrillation: Secondary | ICD-10-CM | POA: Diagnosis not present

## 2017-12-13 DIAGNOSIS — N183 Chronic kidney disease, stage 3 (moderate): Secondary | ICD-10-CM | POA: Diagnosis not present

## 2017-12-13 DIAGNOSIS — N400 Enlarged prostate without lower urinary tract symptoms: Secondary | ICD-10-CM | POA: Diagnosis not present

## 2017-12-13 DIAGNOSIS — I10 Essential (primary) hypertension: Secondary | ICD-10-CM | POA: Diagnosis not present

## 2017-12-13 DIAGNOSIS — K219 Gastro-esophageal reflux disease without esophagitis: Secondary | ICD-10-CM | POA: Diagnosis not present

## 2017-12-13 DIAGNOSIS — S72002A Fracture of unspecified part of neck of left femur, initial encounter for closed fracture: Secondary | ICD-10-CM | POA: Diagnosis not present

## 2017-12-13 DIAGNOSIS — D62 Acute posthemorrhagic anemia: Secondary | ICD-10-CM | POA: Diagnosis not present

## 2017-12-13 DIAGNOSIS — R600 Localized edema: Secondary | ICD-10-CM | POA: Diagnosis not present

## 2017-12-13 DIAGNOSIS — K5900 Constipation, unspecified: Secondary | ICD-10-CM | POA: Diagnosis not present

## 2017-12-13 DIAGNOSIS — I455 Other specified heart block: Secondary | ICD-10-CM | POA: Diagnosis not present

## 2017-12-15 ENCOUNTER — Telehealth: Payer: Self-pay | Admitting: *Deleted

## 2017-12-15 ENCOUNTER — Telehealth: Payer: Self-pay | Admitting: Internal Medicine

## 2017-12-15 DIAGNOSIS — R600 Localized edema: Secondary | ICD-10-CM | POA: Diagnosis not present

## 2017-12-15 DIAGNOSIS — N400 Enlarged prostate without lower urinary tract symptoms: Secondary | ICD-10-CM | POA: Diagnosis not present

## 2017-12-15 DIAGNOSIS — K5900 Constipation, unspecified: Secondary | ICD-10-CM | POA: Diagnosis not present

## 2017-12-15 DIAGNOSIS — N183 Chronic kidney disease, stage 3 (moderate): Secondary | ICD-10-CM | POA: Diagnosis not present

## 2017-12-15 DIAGNOSIS — D62 Acute posthemorrhagic anemia: Secondary | ICD-10-CM | POA: Diagnosis not present

## 2017-12-15 DIAGNOSIS — K219 Gastro-esophageal reflux disease without esophagitis: Secondary | ICD-10-CM | POA: Diagnosis not present

## 2017-12-15 DIAGNOSIS — I10 Essential (primary) hypertension: Secondary | ICD-10-CM | POA: Diagnosis not present

## 2017-12-15 DIAGNOSIS — M6281 Muscle weakness (generalized): Secondary | ICD-10-CM | POA: Diagnosis not present

## 2017-12-15 DIAGNOSIS — I4891 Unspecified atrial fibrillation: Secondary | ICD-10-CM | POA: Diagnosis not present

## 2017-12-15 DIAGNOSIS — I455 Other specified heart block: Secondary | ICD-10-CM | POA: Diagnosis not present

## 2017-12-15 DIAGNOSIS — S72002A Fracture of unspecified part of neck of left femur, initial encounter for closed fracture: Secondary | ICD-10-CM | POA: Diagnosis not present

## 2017-12-15 DIAGNOSIS — G4733 Obstructive sleep apnea (adult) (pediatric): Secondary | ICD-10-CM | POA: Diagnosis not present

## 2017-12-15 NOTE — Telephone Encounter (Signed)
Wife called to report that pt has been in the hospital and currently at [facility]. She stated they have been managing his INR but she didn't agree with how they are managing him. She asked if we could manage him while there and advised her that since they are mananging we could not but once he is discharged we will take over managing INR and dosing Warfarin/Coumadin. She stated she was unhappy with their services. Advised her to call them and speak to someone about this and apologized since she was not happy. She stated she would call back once he is discharged from [facility].

## 2017-12-15 NOTE — Telephone Encounter (Signed)
New Message   Pt wants to let coumadin know that he had to cancel appt due to having a hip replacement and is now at [facility] facility on [address] and he says they checked his INR and it can be retrieved from them

## 2017-12-15 NOTE — Telephone Encounter (Signed)
We spoke with spouse earlier today. Pt is at [facility] during [place], once discharge she is aware to contact us to to resume care of Coumadin therapy.

## 2017-12-18 ENCOUNTER — Encounter (HOSPITAL_COMMUNITY): Payer: Self-pay | Admitting: Orthopedic Surgery

## 2017-12-18 DIAGNOSIS — M6281 Muscle weakness (generalized): Secondary | ICD-10-CM | POA: Diagnosis not present

## 2017-12-18 DIAGNOSIS — I4891 Unspecified atrial fibrillation: Secondary | ICD-10-CM | POA: Diagnosis not present

## 2017-12-18 DIAGNOSIS — I455 Other specified heart block: Secondary | ICD-10-CM | POA: Diagnosis not present

## 2017-12-18 DIAGNOSIS — S72002A Fracture of unspecified part of neck of left femur, initial encounter for closed fracture: Secondary | ICD-10-CM | POA: Diagnosis not present

## 2017-12-18 DIAGNOSIS — N183 Chronic kidney disease, stage 3 (moderate): Secondary | ICD-10-CM | POA: Diagnosis not present

## 2017-12-18 DIAGNOSIS — G4733 Obstructive sleep apnea (adult) (pediatric): Secondary | ICD-10-CM | POA: Diagnosis not present

## 2017-12-18 DIAGNOSIS — K219 Gastro-esophageal reflux disease without esophagitis: Secondary | ICD-10-CM | POA: Diagnosis not present

## 2017-12-18 DIAGNOSIS — N400 Enlarged prostate without lower urinary tract symptoms: Secondary | ICD-10-CM | POA: Diagnosis not present

## 2017-12-18 DIAGNOSIS — D62 Acute posthemorrhagic anemia: Secondary | ICD-10-CM | POA: Diagnosis not present

## 2017-12-18 DIAGNOSIS — I10 Essential (primary) hypertension: Secondary | ICD-10-CM | POA: Diagnosis not present

## 2017-12-18 DIAGNOSIS — K5900 Constipation, unspecified: Secondary | ICD-10-CM | POA: Diagnosis not present

## 2017-12-18 DIAGNOSIS — R600 Localized edema: Secondary | ICD-10-CM | POA: Diagnosis not present

## 2017-12-19 DIAGNOSIS — S72032D Displaced midcervical fracture of left femur, subsequent encounter for closed fracture with routine healing: Secondary | ICD-10-CM | POA: Diagnosis not present

## 2017-12-19 DIAGNOSIS — Z471 Aftercare following joint replacement surgery: Secondary | ICD-10-CM | POA: Diagnosis not present

## 2017-12-19 DIAGNOSIS — Z96642 Presence of left artificial hip joint: Secondary | ICD-10-CM | POA: Diagnosis not present

## 2017-12-20 DIAGNOSIS — Z7901 Long term (current) use of anticoagulants: Secondary | ICD-10-CM | POA: Diagnosis not present

## 2017-12-20 DIAGNOSIS — I4891 Unspecified atrial fibrillation: Secondary | ICD-10-CM | POA: Diagnosis not present

## 2017-12-21 ENCOUNTER — Other Ambulatory Visit: Payer: Self-pay | Admitting: *Deleted

## 2017-12-22 DIAGNOSIS — Z7901 Long term (current) use of anticoagulants: Secondary | ICD-10-CM | POA: Diagnosis not present

## 2017-12-22 DIAGNOSIS — I4891 Unspecified atrial fibrillation: Secondary | ICD-10-CM | POA: Diagnosis not present

## 2017-12-25 DIAGNOSIS — I4891 Unspecified atrial fibrillation: Secondary | ICD-10-CM | POA: Diagnosis not present

## 2017-12-25 DIAGNOSIS — S72002A Fracture of unspecified part of neck of left femur, initial encounter for closed fracture: Secondary | ICD-10-CM | POA: Diagnosis not present

## 2017-12-25 DIAGNOSIS — K219 Gastro-esophageal reflux disease without esophagitis: Secondary | ICD-10-CM | POA: Diagnosis not present

## 2017-12-25 DIAGNOSIS — D62 Acute posthemorrhagic anemia: Secondary | ICD-10-CM | POA: Diagnosis not present

## 2017-12-25 DIAGNOSIS — N400 Enlarged prostate without lower urinary tract symptoms: Secondary | ICD-10-CM | POA: Diagnosis not present

## 2017-12-25 DIAGNOSIS — N183 Chronic kidney disease, stage 3 (moderate): Secondary | ICD-10-CM | POA: Diagnosis not present

## 2017-12-25 DIAGNOSIS — M6281 Muscle weakness (generalized): Secondary | ICD-10-CM | POA: Diagnosis not present

## 2017-12-25 DIAGNOSIS — K5900 Constipation, unspecified: Secondary | ICD-10-CM | POA: Diagnosis not present

## 2017-12-25 DIAGNOSIS — I455 Other specified heart block: Secondary | ICD-10-CM | POA: Diagnosis not present

## 2017-12-25 DIAGNOSIS — G4733 Obstructive sleep apnea (adult) (pediatric): Secondary | ICD-10-CM | POA: Diagnosis not present

## 2017-12-25 DIAGNOSIS — R600 Localized edema: Secondary | ICD-10-CM | POA: Diagnosis not present

## 2017-12-25 DIAGNOSIS — I10 Essential (primary) hypertension: Secondary | ICD-10-CM | POA: Diagnosis not present

## 2017-12-27 DIAGNOSIS — S72002A Fracture of unspecified part of neck of left femur, initial encounter for closed fracture: Secondary | ICD-10-CM | POA: Diagnosis not present

## 2017-12-27 DIAGNOSIS — G4733 Obstructive sleep apnea (adult) (pediatric): Secondary | ICD-10-CM | POA: Diagnosis not present

## 2017-12-27 DIAGNOSIS — M6281 Muscle weakness (generalized): Secondary | ICD-10-CM | POA: Diagnosis not present

## 2017-12-27 DIAGNOSIS — N400 Enlarged prostate without lower urinary tract symptoms: Secondary | ICD-10-CM | POA: Diagnosis not present

## 2017-12-27 DIAGNOSIS — K219 Gastro-esophageal reflux disease without esophagitis: Secondary | ICD-10-CM | POA: Diagnosis not present

## 2017-12-27 DIAGNOSIS — R600 Localized edema: Secondary | ICD-10-CM | POA: Diagnosis not present

## 2017-12-27 DIAGNOSIS — I455 Other specified heart block: Secondary | ICD-10-CM | POA: Diagnosis not present

## 2017-12-27 DIAGNOSIS — D62 Acute posthemorrhagic anemia: Secondary | ICD-10-CM | POA: Diagnosis not present

## 2017-12-27 DIAGNOSIS — Z96642 Presence of left artificial hip joint: Secondary | ICD-10-CM | POA: Diagnosis not present

## 2017-12-27 DIAGNOSIS — I4891 Unspecified atrial fibrillation: Secondary | ICD-10-CM | POA: Diagnosis not present

## 2017-12-27 DIAGNOSIS — N183 Chronic kidney disease, stage 3 (moderate): Secondary | ICD-10-CM | POA: Diagnosis not present

## 2017-12-27 DIAGNOSIS — I10 Essential (primary) hypertension: Secondary | ICD-10-CM | POA: Diagnosis not present

## 2017-12-29 ENCOUNTER — Telehealth: Payer: Self-pay | Admitting: *Deleted

## 2017-12-29 NOTE — Telephone Encounter (Signed)
Wife called and reported that the pt was d/c from Rehab on 12/28/17 and  taking 5mg  qd 2.5mg  on Mondays, INR 2.6 on 12/27/17 and tha t the pt is unable to come in for an appt but will be receiving Home Health through [facility].  Spoke with Jenny Reichmann, RN with [facility] and she stated he is in for PT orders and she will call the Ortho MD to see if he will order these services since pt is unable to get out and she will call back to our Clinic if there is a problem. Will await a call back from [facility]

## 2017-12-30 DIAGNOSIS — I442 Atrioventricular block, complete: Secondary | ICD-10-CM | POA: Diagnosis not present

## 2017-12-30 DIAGNOSIS — I482 Chronic atrial fibrillation: Secondary | ICD-10-CM | POA: Diagnosis not present

## 2017-12-30 DIAGNOSIS — H04129 Dry eye syndrome of unspecified lacrimal gland: Secondary | ICD-10-CM | POA: Diagnosis not present

## 2017-12-30 DIAGNOSIS — S72012D Unspecified intracapsular fracture of left femur, subsequent encounter for closed fracture with routine healing: Secondary | ICD-10-CM | POA: Diagnosis not present

## 2017-12-30 DIAGNOSIS — F0390 Unspecified dementia without behavioral disturbance: Secondary | ICD-10-CM | POA: Diagnosis not present

## 2017-12-30 DIAGNOSIS — N400 Enlarged prostate without lower urinary tract symptoms: Secondary | ICD-10-CM | POA: Diagnosis not present

## 2017-12-30 DIAGNOSIS — D631 Anemia in chronic kidney disease: Secondary | ICD-10-CM | POA: Diagnosis not present

## 2017-12-30 DIAGNOSIS — N183 Chronic kidney disease, stage 3 (moderate): Secondary | ICD-10-CM | POA: Diagnosis not present

## 2017-12-30 DIAGNOSIS — I129 Hypertensive chronic kidney disease with stage 1 through stage 4 chronic kidney disease, or unspecified chronic kidney disease: Secondary | ICD-10-CM | POA: Diagnosis not present

## 2017-12-30 DIAGNOSIS — G4733 Obstructive sleep apnea (adult) (pediatric): Secondary | ICD-10-CM | POA: Diagnosis not present

## 2017-12-30 DIAGNOSIS — G2581 Restless legs syndrome: Secondary | ICD-10-CM | POA: Diagnosis not present

## 2017-12-30 DIAGNOSIS — F419 Anxiety disorder, unspecified: Secondary | ICD-10-CM | POA: Diagnosis not present

## 2017-12-30 DIAGNOSIS — S7002XD Contusion of left hip, subsequent encounter: Secondary | ICD-10-CM | POA: Diagnosis not present

## 2018-01-01 ENCOUNTER — Other Ambulatory Visit: Payer: Self-pay

## 2018-01-01 NOTE — Patient Outreach (Addendum)
[facility]) Care Management  01/01/2018  Hezakiah Champeau Biggs [birth year] [id number]   Referral Date: 01/01/18 Referral Source: [facility] Date of Admission: 12/09/17 Diagnosis: Left hip fracture Date of Discharge: 12/28/17 Facility: [facility]:  [facility]  Outreach attempt # 1 Spoke with patient.  He asked CM to speak with his wife.  Spoke with wife.  She is able to verify HIPAA.  She states that patient is doing ok since being home.  Patient has PT ordered and they are set to come again to today.  She is waiting to hear back from the physician about coumadin check on either Wednesday or Thursday.  She states that someone will be coming to the home.  Social: Patient lives in the home with spouse who is his caregiver at this time.   Conditions: Patient with recent left hip fracture and repair.  Patient went to SNF for rehab and is now home.  Wife states that things have gone ok since being home but slow going.  Encouraged her that things would get better.  Medications: Patient takes medications as ordered and wife able to review medications.  Appointments: Patient has appointment with the surgeon on July 17th and wife will transport.  Consent: RN CM reviewed [facility] services with patient. Wife declined present needs.  Plan: RN CM will send letter and close case.     Jone Baseman, RN, MSN [facility] Care Management Care Management Coordinator Direct Line [phone] Toll Free: [phone]  Fax: [phone]

## 2018-01-01 NOTE — Telephone Encounter (Signed)
Per Tamera Punt [facility] nurse spouse refused Nsg services, thus telephoned pt and spouse and appt scheduled here on 01/08/18 in [facility].

## 2018-01-08 ENCOUNTER — Ambulatory Visit: Payer: PPO | Admitting: *Deleted

## 2018-01-08 DIAGNOSIS — I4891 Unspecified atrial fibrillation: Secondary | ICD-10-CM

## 2018-01-08 DIAGNOSIS — F0390 Unspecified dementia without behavioral disturbance: Secondary | ICD-10-CM | POA: Diagnosis not present

## 2018-01-08 DIAGNOSIS — S72012D Unspecified intracapsular fracture of left femur, subsequent encounter for closed fracture with routine healing: Secondary | ICD-10-CM | POA: Diagnosis not present

## 2018-01-08 DIAGNOSIS — G2581 Restless legs syndrome: Secondary | ICD-10-CM | POA: Diagnosis not present

## 2018-01-08 DIAGNOSIS — Z8673 Personal history of transient ischemic attack (TIA), and cerebral infarction without residual deficits: Secondary | ICD-10-CM | POA: Diagnosis not present

## 2018-01-08 DIAGNOSIS — H04129 Dry eye syndrome of unspecified lacrimal gland: Secondary | ICD-10-CM | POA: Diagnosis not present

## 2018-01-08 DIAGNOSIS — I482 Chronic atrial fibrillation: Secondary | ICD-10-CM | POA: Diagnosis not present

## 2018-01-08 DIAGNOSIS — D631 Anemia in chronic kidney disease: Secondary | ICD-10-CM | POA: Diagnosis not present

## 2018-01-08 DIAGNOSIS — G4733 Obstructive sleep apnea (adult) (pediatric): Secondary | ICD-10-CM | POA: Diagnosis not present

## 2018-01-08 DIAGNOSIS — I129 Hypertensive chronic kidney disease with stage 1 through stage 4 chronic kidney disease, or unspecified chronic kidney disease: Secondary | ICD-10-CM | POA: Diagnosis not present

## 2018-01-08 DIAGNOSIS — S7002XD Contusion of left hip, subsequent encounter: Secondary | ICD-10-CM | POA: Diagnosis not present

## 2018-01-08 DIAGNOSIS — N400 Enlarged prostate without lower urinary tract symptoms: Secondary | ICD-10-CM | POA: Diagnosis not present

## 2018-01-08 DIAGNOSIS — F419 Anxiety disorder, unspecified: Secondary | ICD-10-CM | POA: Diagnosis not present

## 2018-01-08 DIAGNOSIS — Z5181 Encounter for therapeutic drug level monitoring: Secondary | ICD-10-CM | POA: Diagnosis not present

## 2018-01-08 DIAGNOSIS — I442 Atrioventricular block, complete: Secondary | ICD-10-CM | POA: Diagnosis not present

## 2018-01-08 DIAGNOSIS — N183 Chronic kidney disease, stage 3 (moderate): Secondary | ICD-10-CM | POA: Diagnosis not present

## 2018-01-08 LAB — POCT INR: INR: 2.1 (ref 2.0–3.0)

## 2018-01-08 NOTE — Patient Instructions (Signed)
Description   Continue same  dose of coumadin 5mg  (1 tablet) every day except 1/2 tablet (2.5mg ) only on  Mondays   Recheck INR in 3 weeks.  Call with any new medications or if scheduled for any procedures  [phone]

## 2018-01-09 ENCOUNTER — Telehealth: Payer: Self-pay | Admitting: *Deleted

## 2018-01-09 NOTE — Telephone Encounter (Signed)
Spoke with pt's wife and he is taking Warfarin 5mg  not Warfarin 6mg  Asked if needed refill for the Warfarin 5mg  and she states does not need refill at this time Warfarin 5mg  take as directed by coumadin clinic added to his med list

## 2018-01-17 DIAGNOSIS — N400 Enlarged prostate without lower urinary tract symptoms: Secondary | ICD-10-CM | POA: Diagnosis not present

## 2018-01-17 DIAGNOSIS — M47816 Spondylosis without myelopathy or radiculopathy, lumbar region: Secondary | ICD-10-CM | POA: Diagnosis not present

## 2018-01-17 DIAGNOSIS — M152 Bouchard's nodes (with arthropathy): Secondary | ICD-10-CM | POA: Diagnosis not present

## 2018-01-17 DIAGNOSIS — J209 Acute bronchitis, unspecified: Secondary | ICD-10-CM | POA: Diagnosis not present

## 2018-01-17 DIAGNOSIS — Z471 Aftercare following joint replacement surgery: Secondary | ICD-10-CM | POA: Diagnosis not present

## 2018-01-17 DIAGNOSIS — N183 Chronic kidney disease, stage 3 (moderate): Secondary | ICD-10-CM | POA: Diagnosis not present

## 2018-01-17 DIAGNOSIS — I4891 Unspecified atrial fibrillation: Secondary | ICD-10-CM | POA: Diagnosis not present

## 2018-01-17 DIAGNOSIS — S72032D Displaced midcervical fracture of left femur, subsequent encounter for closed fracture with routine healing: Secondary | ICD-10-CM | POA: Diagnosis not present

## 2018-01-17 DIAGNOSIS — Z96642 Presence of left artificial hip joint: Secondary | ICD-10-CM | POA: Diagnosis not present

## 2018-01-29 ENCOUNTER — Encounter: Payer: PPO | Admitting: Internal Medicine

## 2018-01-29 ENCOUNTER — Ambulatory Visit: Payer: PPO | Admitting: *Deleted

## 2018-01-29 DIAGNOSIS — Z5181 Encounter for therapeutic drug level monitoring: Secondary | ICD-10-CM

## 2018-01-29 DIAGNOSIS — I4891 Unspecified atrial fibrillation: Secondary | ICD-10-CM | POA: Diagnosis not present

## 2018-01-29 LAB — POCT INR: INR: 2.3 (ref 2.0–3.0)

## 2018-01-29 NOTE — Patient Instructions (Signed)
Description   Continue same dose of coumadin 5mg  (1 tablet) every day except 1/2 tablet (2.5mg ) only on  Mondays.  Recheck INR in 4 weeks.  Call with any new medications or if scheduled for any procedures  [phone]

## 2018-01-30 ENCOUNTER — Telehealth: Payer: Self-pay | Admitting: Internal Medicine

## 2018-01-30 ENCOUNTER — Encounter: Payer: PPO | Admitting: *Deleted

## 2018-01-30 ENCOUNTER — Telehealth: Payer: Self-pay

## 2018-01-30 NOTE — Telephone Encounter (Signed)
Spoke with pt and reminded pt of remote transmission that is due today. Pt verbalized understanding.   

## 2018-01-30 NOTE — Telephone Encounter (Signed)
Patient was having trouble sending a transmission. I tried to help him send one but was unsuccessful. I gave him the number to [place].

## 2018-01-30 NOTE — Telephone Encounter (Signed)
New Message:         1. Has your device fired? No  2. Is you device beeping? No  3. Are you experiencing draining or swelling at device site? No  4. Are you calling to see if we received your device transmission? No  5. Have you passed out? No   Pt is returning a call for [name]    Please route to [place]

## 2018-01-31 ENCOUNTER — Encounter: Payer: Self-pay | Admitting: Cardiology

## 2018-02-05 DIAGNOSIS — K219 Gastro-esophageal reflux disease without esophagitis: Secondary | ICD-10-CM | POA: Diagnosis not present

## 2018-02-05 DIAGNOSIS — R05 Cough: Secondary | ICD-10-CM | POA: Diagnosis not present

## 2018-02-09 DIAGNOSIS — C651 Malignant neoplasm of right renal pelvis: Secondary | ICD-10-CM | POA: Diagnosis not present

## 2018-02-09 DIAGNOSIS — N3941 Urge incontinence: Secondary | ICD-10-CM | POA: Diagnosis not present

## 2018-02-20 ENCOUNTER — Encounter: Payer: Self-pay | Admitting: Internal Medicine

## 2018-02-26 ENCOUNTER — Encounter (INDEPENDENT_AMBULATORY_CARE_PROVIDER_SITE_OTHER): Payer: Self-pay

## 2018-02-26 ENCOUNTER — Ambulatory Visit: Payer: PPO

## 2018-02-26 DIAGNOSIS — I4891 Unspecified atrial fibrillation: Secondary | ICD-10-CM | POA: Diagnosis not present

## 2018-02-26 DIAGNOSIS — Z5181 Encounter for therapeutic drug level monitoring: Secondary | ICD-10-CM | POA: Diagnosis not present

## 2018-02-26 LAB — POCT INR: INR: 3.2 — AB (ref 2.0–3.0)

## 2018-02-26 NOTE — Patient Instructions (Signed)
Description   Skip tomorrow's dosage of Coumadin, then resume same dosage 5mg  (1 tablet) every day except 1/2 tablet (2.5mg ) on Mondays.  Recheck INR in 3 weeks.  Call with any new medications or if scheduled for any procedures  [phone]

## 2018-02-28 DIAGNOSIS — M47816 Spondylosis without myelopathy or radiculopathy, lumbar region: Secondary | ICD-10-CM | POA: Diagnosis not present

## 2018-02-28 DIAGNOSIS — M152 Bouchard's nodes (with arthropathy): Secondary | ICD-10-CM | POA: Diagnosis not present

## 2018-02-28 DIAGNOSIS — N183 Chronic kidney disease, stage 3 (moderate): Secondary | ICD-10-CM | POA: Diagnosis not present

## 2018-02-28 DIAGNOSIS — E7800 Pure hypercholesterolemia, unspecified: Secondary | ICD-10-CM | POA: Diagnosis not present

## 2018-02-28 DIAGNOSIS — N400 Enlarged prostate without lower urinary tract symptoms: Secondary | ICD-10-CM | POA: Diagnosis not present

## 2018-02-28 DIAGNOSIS — I4891 Unspecified atrial fibrillation: Secondary | ICD-10-CM | POA: Diagnosis not present

## 2018-03-03 DIAGNOSIS — M79642 Pain in left hand: Secondary | ICD-10-CM | POA: Diagnosis not present

## 2018-03-13 DIAGNOSIS — M19042 Primary osteoarthritis, left hand: Secondary | ICD-10-CM | POA: Diagnosis not present

## 2018-03-14 ENCOUNTER — Ambulatory Visit (INDEPENDENT_AMBULATORY_CARE_PROVIDER_SITE_OTHER): Payer: PPO | Admitting: *Deleted

## 2018-03-14 ENCOUNTER — Other Ambulatory Visit: Payer: Self-pay | Admitting: Internal Medicine

## 2018-03-14 ENCOUNTER — Ambulatory Visit: Payer: PPO | Admitting: Internal Medicine

## 2018-03-14 ENCOUNTER — Encounter: Payer: Self-pay | Admitting: Internal Medicine

## 2018-03-14 VITALS — BP 126/70 | HR 73 | Ht 70.0 in | Wt 171.0 lb

## 2018-03-14 DIAGNOSIS — I482 Chronic atrial fibrillation: Secondary | ICD-10-CM

## 2018-03-14 DIAGNOSIS — I4891 Unspecified atrial fibrillation: Secondary | ICD-10-CM | POA: Diagnosis not present

## 2018-03-14 DIAGNOSIS — I4821 Permanent atrial fibrillation: Secondary | ICD-10-CM

## 2018-03-14 DIAGNOSIS — I442 Atrioventricular block, complete: Secondary | ICD-10-CM | POA: Diagnosis not present

## 2018-03-14 DIAGNOSIS — Z5181 Encounter for therapeutic drug level monitoring: Secondary | ICD-10-CM

## 2018-03-14 LAB — CUP PACEART INCLINIC DEVICE CHECK
Battery Remaining Longevity: 24 mo
Battery Voltage: 2.72 V
Implantable Lead Implant Date: 20130702
Implantable Pulse Generator Implant Date: 20130702
Lead Channel Impedance Value: 470 Ohm
Lead Channel Pacing Threshold Amplitude: 0.625 V
Lead Channel Pacing Threshold Pulse Width: 0.4 ms
Lead Channel Setting Pacing Amplitude: 2.5 V
Lead Channel Setting Pacing Pulse Width: 0.4 ms
Lead Channel Setting Sensing Sensitivity: 4 mV
MDC IDC LEAD LOCATION: 753860
MDC IDC MSMT BATTERY IMPEDANCE: 2676 Ohm
MDC IDC MSMT LEADCHNL RA IMPEDANCE VALUE: 0 Ohm
MDC IDC MSMT LEADCHNL RV PACING THRESHOLD AMPLITUDE: 0.75 V
MDC IDC MSMT LEADCHNL RV PACING THRESHOLD PULSEWIDTH: 0.4 ms
MDC IDC SESS DTM: 20190911143321
MDC IDC STAT BRADY RV PERCENT PACED: 99 %

## 2018-03-14 LAB — POCT INR: INR: 2.8 (ref 2.0–3.0)

## 2018-03-14 MED ORDER — WARFARIN SODIUM 5 MG PO TABS: 5.0000 mg | ORAL_TABLET | Freq: Every day | ORAL | 0 refills | Status: DC

## 2018-03-14 NOTE — Patient Instructions (Signed)
Medication Instructions:  Your physician recommends that you continue on your [place] medications as directed. Please refer to the [place] Medication list given to you today.  *If you need a refill on your cardiac medications before your next appointment, please call your pharmacy*  Labwork: None ordered  Testing/Procedures: None ordered  Follow-Up: Remote monitoring is used to monitor your Pacemaker or ICD from home. This monitoring reduces the number of office visits required to check your device to one time per year. It allows [place] to keep an eye on the functioning of your device to ensure it is working properly. You are scheduled for a device check from home on 06/13/2018. You may send your transmission at any time that day. If you have a wireless device, the transmission will be sent automatically. After your physician reviews your transmission, you will receive a postcard with your next transmission date.  Your physician wants you to follow-up in: 1 year with Dr. Rayann Heman.  You will receive a reminder letter in the mail two months in advance. If you don't receive a letter, please call our office to schedule the follow-up appointment.  Thank you for choosing [facility]!!

## 2018-03-14 NOTE — Telephone Encounter (Signed)
°*  STAT* If patient is at the pharmacy, call can be transferred to refill team.   1. Which medications need to be refilled? (please list name of each medication and dose if known) changing pharmacy- needs a new prescription for Warfarin  2. Which pharmacy/location (including street and city if local pharmacy) is medication to be sent to?[facility]- Fax number is [phone]  3. Do they need a 30 day or 90 day supply? 90 and refills

## 2018-03-14 NOTE — Patient Instructions (Signed)
Description   Continue taking 5mg  (1 tablet) every day except 1/2 tablet (2.5mg ) on Mondays.  Recheck INR in 4 weeks.  Call with any new medications or if scheduled for any procedures  [phone]

## 2018-03-14 NOTE — Progress Notes (Signed)
PCP: Michael Orn, MD   Primary EP:  [name] is a 84 y.o. male who presents today for routine electrophysiology followup.  Since last being seen in our clinic, the patient reports doing reasonably well.  He had mechanical fall with hip fracture this past may.  Continues to recover.  Memory continues to worsen.  Today, he denies symptoms of palpitations, chest pain, shortness of breath,  lower extremity edema, dizziness, presyncope, or syncope.  The patient is otherwise without complaint today.   Past Medical History:  Diagnosis Date  . Allergic rhinitis   . Arthritis   . BPH (benign prostatic hyperplasia)   . Bronchitis   . Complete heart block ([place])    pacemaker originally placed in 1988  . Dementia   . Esophageal reflux   . Hypertension   . Lung nodule    noted on CXR December 2012  . Normal nuclear stress test 2013  . OSA (obstructive sleep apnea)    uses cpap  . Permanent atrial fibrillation ([facility])    on coumadin  . Pneumonia   . Presence of permanent cardiac pacemaker   . Renal insufficiency    Rt kidney removed d/t maglinant tumor  . RLS (restless legs syndrome)   . Stroke [facility])    TIA greater than 20 years  . Transitional cell carcinoma ([place])    s/p nephrectomy in  2010   Past Surgical History:  Procedure Laterality Date  . CARDIOVASCULAR STRESS TEST  03/23/2009   EF 65%, NORMAL  . CATARACT EXTRACTION W/PHACO Left 08/26/2015   Procedure: CATARACT EXTRACTION PHACO AND INTRAOCULAR LENS PLACEMENT (IOC);  Surgeon: Marylynn Pearson, MD;  Location: [facility];  Service: Ophthalmology;  Laterality: Left;  . DOPPLER ECHOCARDIOGRAPHY  03/26/2001   EF 55%  . EYE SURGERY     tumor removed from Rt eye lid  . HERNIA REPAIR     multiple  . MASS EXCISION Left 11/07/2012   Procedure: MINOR EXCISION OF MASS;  Surgeon: Haywood Lasso, MD;  Location: [facility];  Service: General;  Laterality: Left;  . NEPHRECTOMY  2010   rt  . PACEMAKER INSERTION   1988   most recent generator change by Michael Rayann Heman 20067/2/13 with new right ventricular lead placed  . PERMANENT PACEMAKER GENERATOR CHANGE N/A 01/03/2012   Procedure: PERMANENT PACEMAKER GENERATOR CHANGE;  Surgeon: Thompson Grayer, MD;  Location[facility] CATH LAB;  Service: Cardiovascular;  Laterality: N/A;  . TOTAL HIP ARTHROPLASTY Left 12/04/2017   Procedure: TOTAL HIP ARTHROPLASTY ANTERIOR APPROACH WITH CABLE;  Surgeon: Rod Can, MD;  Location: [facility];  Service: Orthopedics;  Laterality: Left;  . US ECHOCARDIOGRAPHY  04/22/2008   EF 55-60%    ROS- all systems are reviewed and negative except as per HPI above  [facility] Medications  Medication Sig Dispense Refill  . acetaminophen (TYLENOL) 325 MG tablet Take 650 mg by mouth every 4 (four) hours as needed for mild pain or fever.     Marland Kitchen aspirin EC 81 MG tablet Take 81 mg by mouth daily.    . cycloSPORINE (RESTASIS) 0.05 % ophthalmic emulsion Place 1 drop into both eyes 2 (two) times daily.     Marland Kitchen doxazosin (CARDURA) 2 MG tablet Take 2 mg by mouth daily.    . finasteride (PROSCAR) 5 MG tablet Take 5 mg by mouth daily.    . furosemide (LASIX) 40 MG tablet Take 40 mg by mouth daily.    Marland Kitchen  galantamine (RAZADYNE) 8 MG tablet Take 12 mg by mouth 2 (two) times daily.     Marland Kitchen glucosamine-chondroitin 500-400 MG tablet Take 1 tablet by mouth 2 (two) times daily.     Marland Kitchen ipratropium (ATROVENT) 0.06 % nasal spray Place 2 sprays into both nostrils 2 (two) times daily.    . metoprolol tartrate (LOPRESSOR) 25 MG tablet Take 25 mg by mouth 2 (two) times daily.    . Multiple Vitamin (MULTIVITAMIN) capsule Take 1 capsule by mouth daily.      . pantoprazole (PROTONIX) 40 MG tablet Take 40 mg by mouth daily.  5  . warfarin (COUMADIN) 5 MG tablet Take as directed by coumadin clinic     No [place] facility-administered medications for this visit.     Physical Exam: Vitals:   03/14/18 1056  BP: 126/70  Pulse: 73  SpO2: 93%  Weight: 171 lb (77.6 kg)    Height: 5\' 10"  (1.778 m)    GEN- The patient is elderly appearing,  Head- normocephalic, atraumatic Eyes-  Sclera clear, conjunctiva pink Ears- hearing intact Oropharynx- clear Lungs- Clear to ausculation bilaterally, normal work of breathing Chest- pacemaker pocket is well healed Heart- Regular rate and rhythm (paced) 2/6 SEM LUSB which is early peaking GI- soft, NT, ND, + BS Extremities- no clubbing, cyanosis, or edema  Pacemaker interrogation- reviewed in detail today,  See PACEART report  ekg tracing ordered today is personally reviewed and shows afib, V paced  Assessment and Plan:  1. Symptomatic complete heart block Normal pacemaker function See Pace Art report No changes today  2. Permanent afib On coumadin Given prior stroke, also now taking asa.  3. NSVT Asymptomatic Echo 11/30/17 is personally reviewed.  EF 60%, mild AS No changes  [facility] Return in a year  Thompson Grayer MD, [facility] 03/14/2018 10:59 AM

## 2018-03-19 DIAGNOSIS — N183 Chronic kidney disease, stage 3 (moderate): Secondary | ICD-10-CM | POA: Diagnosis not present

## 2018-03-19 DIAGNOSIS — I4891 Unspecified atrial fibrillation: Secondary | ICD-10-CM | POA: Diagnosis not present

## 2018-03-19 DIAGNOSIS — M47816 Spondylosis without myelopathy or radiculopathy, lumbar region: Secondary | ICD-10-CM | POA: Diagnosis not present

## 2018-03-19 DIAGNOSIS — M152 Bouchard's nodes (with arthropathy): Secondary | ICD-10-CM | POA: Diagnosis not present

## 2018-03-19 DIAGNOSIS — N400 Enlarged prostate without lower urinary tract symptoms: Secondary | ICD-10-CM | POA: Diagnosis not present

## 2018-03-19 DIAGNOSIS — F0390 Unspecified dementia without behavioral disturbance: Secondary | ICD-10-CM | POA: Diagnosis not present

## 2018-03-19 DIAGNOSIS — E7800 Pure hypercholesterolemia, unspecified: Secondary | ICD-10-CM | POA: Diagnosis not present

## 2018-04-11 ENCOUNTER — Ambulatory Visit: Payer: PPO | Admitting: *Deleted

## 2018-04-11 DIAGNOSIS — Z5181 Encounter for therapeutic drug level monitoring: Secondary | ICD-10-CM | POA: Diagnosis not present

## 2018-04-11 DIAGNOSIS — I4891 Unspecified atrial fibrillation: Secondary | ICD-10-CM

## 2018-04-11 LAB — POCT INR: INR: 2.6 (ref 2.0–3.0)

## 2018-04-11 NOTE — Patient Instructions (Signed)
Description   Continue taking 5mg  (1 tablet) every day except 1/2 tablet (2.5mg ) on Mondays.  Recheck INR in 4 weeks.  Call with any new medications or if scheduled for any procedures  [phone]

## 2018-04-17 DIAGNOSIS — F0390 Unspecified dementia without behavioral disturbance: Secondary | ICD-10-CM | POA: Diagnosis not present

## 2018-04-17 DIAGNOSIS — Z23 Encounter for immunization: Secondary | ICD-10-CM | POA: Diagnosis not present

## 2018-04-24 DIAGNOSIS — J300 Vasomotor rhinitis: Secondary | ICD-10-CM | POA: Diagnosis not present

## 2018-04-29 ENCOUNTER — Encounter: Payer: Self-pay | Admitting: Internal Medicine

## 2018-04-30 ENCOUNTER — Encounter: Payer: Self-pay | Admitting: Internal Medicine

## 2018-04-30 ENCOUNTER — Ambulatory Visit (INDEPENDENT_AMBULATORY_CARE_PROVIDER_SITE_OTHER): Payer: PPO | Admitting: Internal Medicine

## 2018-04-30 VITALS — BP 114/80 | HR 71 | Ht 70.0 in | Wt 174.8 lb

## 2018-04-30 DIAGNOSIS — J410 Simple chronic bronchitis: Secondary | ICD-10-CM | POA: Diagnosis not present

## 2018-04-30 DIAGNOSIS — G4733 Obstructive sleep apnea (adult) (pediatric): Secondary | ICD-10-CM | POA: Diagnosis not present

## 2018-04-30 NOTE — Progress Notes (Signed)
Subjective:    Patient ID: [name], male    DOB: February 14, 1930, 84 y.o.   MRN: [id number]  HPI  male never smoker followed for OSA, allergic rhinitis, bronchitis, multiple granulomas,  Cheyne Stokes, Restless legs, complicated by CM/ CAD/  A. Fib/ bradycardia tachycardia/pacer,  GERD. History right nephrectomy for cancer.  NPSG 03/09/06 AHI 39.7 --------------------------------------------------------------------------------------------------------- 05/01/17-  male never smoker followed for OSA, allergic rhinitis, bronchitis,multiple granulomas history Cheyne Stokes, Restless legs, complicated by CM/A. fib/bradycardia tachycardia/pacer, GERD. History right nephrectomy for cancer. ---OSA; DME [facility]. Pt uses CPAP nightly and DL is attached. Pt questions his CPAP presssure and would like to discuss concerns.  CPAP 9/[employer] Download 97% compliance, AHI 2.8/hour. He is very comfortable with CPAP and sleeps well with no concerns expressed. Feels better off with it. We discussed cleaning and maintenance of equipment. He denies cough or breathing concerns. Still uses clonazepam for restless legs, now refilled by Dr. Laurann Montana. CXR 10/18/16 IMPRESSION: Partial clearing of right lower lobe pneumonia. Right lower lobe infiltrate has improved in the interval with a mild amount of infiltrate remaining. No pleural effusion. Cardiac enlargement without heart failure. Pacemaker unchanged. One lead is disconnected from the generator pack which is unchanged from the prior study. Underlying COPD  04/30/2018- 84 yo male never smoker followed for OSA, allergic rhinitis, COPD/bronchitis,multiple granulomas history Cheyne Stokes, Restless legs, complicated by CM/ CAD /A. Fib/ bradycardia tachycardia/pacer, GERD. History right nephrectomy for cancer. Hospital in June after falling and breaking hip requiring left hip replacement. -----OSA: DME [facility]. Pt wears CPAP nightly. DL attached.  Download 98% compliance AHI  6.7/hour CPAP 9 CWP/[employer] >> today replace old machine changing to auto 5-15 No respiratory problems with hip replacement.  Says his breathing is "fine" without routine cough or wheeze. Sleeping comfortably with routine use of CPAP which prevents daytime sleepiness and snoring.  The machine is old and this is an opportunity to change to AutoPap. CXR 11/29/2017- Stable cardiomegaly. The hila, mediastinum, right-sided pacemaker, and pleura are normal. No active cardiopulmonary disease.  ROS-see HPI  + = positive Constitutional:   No-   weight loss, night sweats, fevers, chills, fatigue, lassitude. HEENT:   No-  headaches, difficulty swallowing, tooth/dental problems, sore throat,       No-  sneezing, itching, ear ache, nasal congestion, post nasal drip,  CV:  No-   chest pain, orthopnea, PND, swelling in lower extremities, anasarca, +dizziness, palpitations Resp: + shortness of breath with exertion or at rest.              No-   productive cough,  No non-productive cough,  No- coughing up of blood.              No-   change in color of mucus.  No- wheezing.   Skin: No-   rash or lesions. GI:  No-   heartburn, indigestion, abdominal pain, nausea, vomiting, GU:  MS:  No-   joint pain or swelling.   Neuro-     nothing unusual Psych:  No- change in mood or affect. No depression or anxiety.  No memory loss.  OBJ- Physical Exam General- Alert, Oriented, Affect-appropriate, Distress- none acute. +thin elderly Skin- rash-none, lesions- none, excoriation- none Lymphadenopathy- none Head- atraumatic            Eyes- Gross vision intact, PERRLA, conjunctivae and secretions clear            Ears- Hearing, canals-normal  Nose- Clear, no-Septal dev, mucus, polyps, erosion, perforation             Throat- Mallampati II , mucosa clear , drainage- none, tonsils- atrophic Neck- flexible , trachea midline, no stridor , thyroid nl, carotid no bruit Chest - symmetrical excursion , unlabored            Heart/CV- RRR , 2-3/6 SEM AS murmur , no gallop  , no rub, nl s1 s2                           - JVD- none , edema-+ 2/ support hose, stasis changes- , varices- none           Lung-clear, no rales, unlabored, wheeze- none, cough- none , dullness-none, rub- none           Chest wall- +R pacemaker Abd-  Br/ Gen/ Rectal- Not done, not indicated Extrem- cyanosis- none, clubbing, none, atrophy- none, strength- nl Neuro- grossly intact to observation  Assessment & Plan:

## 2018-04-30 NOTE — Patient Instructions (Signed)
Order- DME [name]- please replace old CPAP machine, change to auto 5-15, mask of choice humidifier, supplies, AirView  Please call if we can help

## 2018-05-01 ENCOUNTER — Ambulatory Visit: Payer: PPO | Admitting: Internal Medicine

## 2018-05-09 ENCOUNTER — Ambulatory Visit: Payer: PPO | Admitting: *Deleted

## 2018-05-09 DIAGNOSIS — Z5181 Encounter for therapeutic drug level monitoring: Secondary | ICD-10-CM

## 2018-05-09 DIAGNOSIS — I4891 Unspecified atrial fibrillation: Secondary | ICD-10-CM

## 2018-05-09 LAB — POCT INR: INR: 2.2 (ref 2.0–3.0)

## 2018-05-09 NOTE — Patient Instructions (Signed)
Description   Continue taking 5mg  (1 tablet) every day except 1/2 tablet (2.5mg ) on Mondays.  Recheck INR in 6 weeks.  Call with any new medications or if scheduled for any procedures  [phone]

## 2018-05-11 DIAGNOSIS — G4733 Obstructive sleep apnea (adult) (pediatric): Secondary | ICD-10-CM | POA: Diagnosis not present

## 2018-05-17 DIAGNOSIS — F0390 Unspecified dementia without behavioral disturbance: Secondary | ICD-10-CM | POA: Diagnosis not present

## 2018-06-03 DIAGNOSIS — G4733 Obstructive sleep apnea (adult) (pediatric): Secondary | ICD-10-CM | POA: Diagnosis not present

## 2018-06-10 DIAGNOSIS — G4733 Obstructive sleep apnea (adult) (pediatric): Secondary | ICD-10-CM | POA: Diagnosis not present

## 2018-06-13 ENCOUNTER — Telehealth: Payer: Self-pay | Admitting: Cardiology

## 2018-06-13 NOTE — Telephone Encounter (Signed)
Confirmed remote transmission w/ pt wife.   

## 2018-06-15 ENCOUNTER — Encounter: Payer: Self-pay | Admitting: Cardiology

## 2018-06-18 DIAGNOSIS — Z471 Aftercare following joint replacement surgery: Secondary | ICD-10-CM | POA: Diagnosis not present

## 2018-06-18 DIAGNOSIS — Z96642 Presence of left artificial hip joint: Secondary | ICD-10-CM | POA: Diagnosis not present

## 2018-06-18 DIAGNOSIS — S86911A Strain of unspecified muscle(s) and tendon(s) at lower leg level, right leg, initial encounter: Secondary | ICD-10-CM | POA: Diagnosis not present

## 2018-06-18 NOTE — Assessment & Plan Note (Signed)
He continues to benefit from CPAP with improved sleep.  Download confirms good compliance and control.  Machine is old. Plan-replace old machine changing to AutoPap 5-15

## 2018-06-18 NOTE — Assessment & Plan Note (Signed)
Good control.  Minimal active bronchitis symptoms with no exacerbations since last visit. Plan-no changes are appropriate.

## 2018-06-20 ENCOUNTER — Ambulatory Visit: Payer: PPO | Admitting: Pharmacist

## 2018-06-20 DIAGNOSIS — Z5181 Encounter for therapeutic drug level monitoring: Secondary | ICD-10-CM | POA: Diagnosis not present

## 2018-06-20 DIAGNOSIS — I4891 Unspecified atrial fibrillation: Secondary | ICD-10-CM | POA: Diagnosis not present

## 2018-06-20 LAB — POCT INR: INR: 2.5 (ref 2.0–3.0)

## 2018-06-20 NOTE — Patient Instructions (Signed)
Continue taking 5mg  (1 tablet) every day except 1/2 tablet (2.5mg ) on Mondays.  Recheck INR in 6 weeks.  Call with any new medications or if scheduled for any procedures  [phone]

## 2018-06-22 DIAGNOSIS — S86911D Strain of unspecified muscle(s) and tendon(s) at lower leg level, right leg, subsequent encounter: Secondary | ICD-10-CM | POA: Diagnosis not present

## 2018-06-27 ENCOUNTER — Emergency Department (HOSPITAL_COMMUNITY)
Admission: EM | Admit: 2018-06-27 | Discharge: 2018-06-27 | Disposition: A | Discharge status: Home / Self Care | Payer: PPO | Attending: Emergency Medicine | Admitting: Emergency Medicine

## 2018-06-27 ENCOUNTER — Encounter (HOSPITAL_COMMUNITY): Payer: Self-pay

## 2018-06-27 ENCOUNTER — Emergency Department (HOSPITAL_COMMUNITY): Payer: PPO

## 2018-06-27 DIAGNOSIS — Y929 Unspecified place or not applicable: Secondary | ICD-10-CM | POA: Insufficient documentation

## 2018-06-27 DIAGNOSIS — W01190A Fall on same level from slipping, tripping and stumbling with subsequent striking against furniture, initial encounter: Secondary | ICD-10-CM | POA: Diagnosis not present

## 2018-06-27 DIAGNOSIS — S0292XA Unspecified fracture of facial bones, initial encounter for closed fracture: Secondary | ICD-10-CM | POA: Diagnosis not present

## 2018-06-27 DIAGNOSIS — Z79899 Other long term (current) drug therapy: Secondary | ICD-10-CM | POA: Diagnosis not present

## 2018-06-27 DIAGNOSIS — R51 Headache: Secondary | ICD-10-CM | POA: Diagnosis present

## 2018-06-27 DIAGNOSIS — W19XXXA Unspecified fall, initial encounter: Secondary | ICD-10-CM

## 2018-06-27 DIAGNOSIS — I10 Essential (primary) hypertension: Secondary | ICD-10-CM | POA: Insufficient documentation

## 2018-06-27 DIAGNOSIS — Z7901 Long term (current) use of anticoagulants: Secondary | ICD-10-CM | POA: Insufficient documentation

## 2018-06-27 DIAGNOSIS — I4891 Unspecified atrial fibrillation: Secondary | ICD-10-CM | POA: Insufficient documentation

## 2018-06-27 DIAGNOSIS — Z85528 Personal history of other malignant neoplasm of kidney: Secondary | ICD-10-CM | POA: Diagnosis not present

## 2018-06-27 DIAGNOSIS — Y999 Unspecified external cause status: Secondary | ICD-10-CM | POA: Diagnosis not present

## 2018-06-27 DIAGNOSIS — Y939 Activity, unspecified: Secondary | ICD-10-CM | POA: Insufficient documentation

## 2018-06-27 DIAGNOSIS — R040 Epistaxis: Secondary | ICD-10-CM | POA: Insufficient documentation

## 2018-06-27 DIAGNOSIS — Z95811 Presence of heart assist device: Secondary | ICD-10-CM | POA: Diagnosis not present

## 2018-06-27 DIAGNOSIS — F0390 Unspecified dementia without behavioral disturbance: Secondary | ICD-10-CM | POA: Insufficient documentation

## 2018-06-27 DIAGNOSIS — S022XXA Fracture of nasal bones, initial encounter for closed fracture: Secondary | ICD-10-CM | POA: Diagnosis not present

## 2018-06-27 DIAGNOSIS — I4892 Unspecified atrial flutter: Secondary | ICD-10-CM | POA: Diagnosis not present

## 2018-06-27 LAB — URINALYSIS, ROUTINE W REFLEX MICROSCOPIC
BILIRUBIN URINE: NEGATIVE
Glucose, UA: NEGATIVE mg/dL
Hgb urine dipstick: NEGATIVE
KETONES UR: NEGATIVE mg/dL
Leukocytes, UA: NEGATIVE
Nitrite: NEGATIVE
Protein, ur: NEGATIVE mg/dL
Specific Gravity, Urine: 1.013 (ref 1.005–1.030)
pH: 6 (ref 5.0–8.0)

## 2018-06-27 LAB — PROTIME-INR
INR: 2.17
Prothrombin Time: 23.9 s — ABNORMAL HIGH (ref 11.4–15.2)

## 2018-06-27 MED ORDER — OXYCODONE-ACETAMINOPHEN 5-325 MG PO TABS: 1.0000 | ORAL_TABLET | Freq: Once | ORAL | Status: DC

## 2018-06-27 MED ORDER — TRAMADOL HCL 50 MG PO TABS: 50.0000 mg | ORAL_TABLET | Freq: Four times a day (QID) | ORAL | 0 refills | Status: DC | PRN

## 2018-06-27 NOTE — ED Triage Notes (Addendum)
Pt from home; pt had a fall at home; pt putting up a flag pole and lost his balance and fell forward and hit face; nose bleeding, ecchymosis to L eye; also c/o R leg pain that started after tripping over a curb 2 weeks ago; denies loc; pt on coumadin

## 2018-06-27 NOTE — ED Provider Notes (Signed)
[facility] EMERGENCY DEPARTMENT Provider Note   CSN: [id number] Arrival date & time: 06/27/18  1514     History   Chief Complaint No chief complaint on file.   HPI [name] is a 84 y.o. male.  The history is provided by the patient and a significant other. No language interpreter was used.  Fall      84 year old male with history of complete heart block status post pacemaker, dementia, arthritis, A. fib currently on anticoagulation, prior stroke presenting for evaluation of a fall.  Prior to arrival, patient was at home, he was trying to adjust a flag pole and lost balance, fell forward, striking his face against a cabinet.  He denies any loss of consciousness.  He was able to call for help.  He was bleeding coming from his nose which family was concerned because patient is currently on Coumadin.  Bleeding has since stopped.  He did report pain to his face, sharp, throbbing, moderate in severity initially but mild now.  He denies any other injury.  Does not complain of any significant headache, denies any neck pain, no complaints of any precipitating symptoms prior to the fall.  He has had multiple falls in the past, injuring his left hip from one fall, and then hurting his right feet from another fall within the past few months.  He mentioned his Coumadin level is therapeutic.  Family members at bedside  Past Medical History:  Diagnosis Date  . Allergic rhinitis   . Arthritis   . BPH (benign prostatic hyperplasia)   . Bronchitis   . Complete heart block ([place])    pacemaker originally placed in 1988  . Dementia ([place])   . Esophageal reflux   . Hypertension   . Lung nodule    noted on CXR December 2012  . Normal nuclear stress test 2013  . OSA (obstructive sleep apnea)    uses cpap  . Permanent atrial fibrillation    on coumadin  . Pneumonia   . Presence of permanent cardiac pacemaker   . Renal insufficiency    Rt kidney removed d/t maglinant  tumor  . RLS (restless legs syndrome)   . Stroke [facility])    TIA greater than 20 years  . Transitional cell carcinoma ([place])    s/p nephrectomy in  2010    Patient Active Problem List   Diagnosis Date Noted  . Closed displaced fracture of left femoral neck ([place]) 12/05/2017  . Status post total replacement of left hip   . Benign essential HTN   . History of CVA (cerebrovascular accident)   . Cardiac pacemaker in situ   . Postoperative pain   . Acute blood loss anemia   . Closed left hip fracture ([place]) 11/29/2017  . Lower leg edema 11/29/2017  . Fall 11/29/2017  . Essential hypertension 11/29/2017  . Stroke (cerebrum) ([place]) 11/29/2017  . BPH (benign prostatic hyperplasia) 11/29/2017  . Dementia ([place]) 11/29/2017  . CKD (chronic kidney disease), stage III ([place]) 11/29/2017  . Closed right hip fracture, initial encounter ([place]) 11/29/2017  . Diarrhea 11/29/2017  . Osteoarthritis of fifth carpometacarpal joint of left hand 07/18/2017  . Lung nodule 02/21/2016  . SIRS (systemic inflammatory response syndrome) ([place]) 01/02/2016  . Sepsis ([place]) 01/02/2016  . Encounter for therapeutic drug monitoring 09/23/2013  . Premature ventricular contraction 06/03/2013  . Pacemaker-Medtronic 01/04/2012  . Dyspnea 10/28/2011  . Complete heart block ([place]) 09/05/2011  . Long term ([place]) use of anticoagulants 02/04/2011  .  BRADYCARDIA-TACHYCARDIA SYNDROME 08/16/2010  . ALLERGIC RHINITIS 12/24/2007  . Obstructive sleep apnea 08/17/2007  . RESTLESS LEG SYNDROME 08/17/2007  . FIBRILLATION, ATRIAL 08/17/2007  . Chronic bronchitis ([place]) 08/17/2007  . Esophageal reflux 08/17/2007  . Insomnia secondary to chronic pain 08/17/2007  . CHEYNE-STOKES RESPIRATION 08/17/2007    Past Surgical History:  Procedure Laterality Date  . CARDIOVASCULAR STRESS TEST  03/23/2009   EF 65%, NORMAL  . CATARACT EXTRACTION W/PHACO Left 08/26/2015   Procedure: CATARACT EXTRACTION PHACO AND INTRAOCULAR LENS PLACEMENT (IOC);   Surgeon: Marylynn Pearson, MD;  Location: [place];  Service: Ophthalmology;  Laterality: Left;  . DOPPLER ECHOCARDIOGRAPHY  03/26/2001   EF 55%  . EYE SURGERY     tumor removed from Rt eye lid  . HERNIA REPAIR     multiple  . MASS EXCISION Left 11/07/2012   Procedure: MINOR EXCISION OF MASS;  Surgeon: Haywood Lasso, MD;  Location: [place];  Service: General;  Laterality: Left;  . NEPHRECTOMY  2010   rt  . PACEMAKER INSERTION  1988   most recent generator change by Dr Rayann Heman 20067/2/13 with new right ventricular lead placed  . PERMANENT PACEMAKER GENERATOR CHANGE N/A 01/03/2012   Procedure: PERMANENT PACEMAKER GENERATOR CHANGE;  Surgeon: Thompson Grayer, MD;  Location: [facility] CATH LAB;  Service: Cardiovascular;  Laterality: N/A;  . TOTAL HIP ARTHROPLASTY Left 12/04/2017   Procedure: TOTAL HIP ARTHROPLASTY ANTERIOR APPROACH WITH CABLE;  Surgeon: Rod Can, MD;  Location: [place];  Service: Orthopedics;  Laterality: Left;  . US ECHOCARDIOGRAPHY  04/22/2008   EF 55-60%        Home Medications    Prior to Admission medications   Medication Sig Start Date End Date Taking? Authorizing Provider  acetaminophen (TYLENOL) 325 MG tablet Take 650 mg by mouth every 4 (four) hours as needed for mild pain or fever.     [provider]  aspirin EC 81 MG tablet Take 81 mg by mouth daily.    [provider]  cycloSPORINE (RESTASIS) 0.05 % ophthalmic emulsion Place 1 drop into both eyes 2 (two) times daily.     [provider]  doxazosin (CARDURA) 2 MG tablet Take 2 mg by mouth daily.    [provider]  finasteride (PROSCAR) 5 MG tablet Take 5 mg by mouth daily.    [provider]  furosemide (LASIX) 40 MG tablet Take 40 mg by mouth daily.    [provider]  galantamine (RAZADYNE) 8 MG tablet Take 12 mg by mouth 2 (two) times daily.     [provider]  glucosamine-chondroitin 500-400 MG tablet Take 1 tablet by mouth 2 (two) times  daily.     [provider]  ipratropium (ATROVENT) 0.06 % nasal spray Place 2 sprays into both nostrils 2 (two) times daily.    [provider]  Lactobacillus (PROBIOTIC ACIDOPHILUS PO) Take 1 tablet by mouth daily.    [provider]  metoprolol tartrate (LOPRESSOR) 25 MG tablet Take 25 mg by mouth 2 (two) times daily.    [provider]  Multiple Vitamin (MULTIVITAMIN) capsule Take 1 capsule by mouth daily.      [provider]  pantoprazole (PROTONIX) 40 MG tablet Take 40 mg by mouth daily. 02/05/18   [provider]  warfarin (COUMADIN) 5 MG tablet Take 1 tablet (5 mg total) by mouth daily. Take as directed by coumadin clinic 03/14/18   Thompson Grayer, MD    Family History Family  History  Problem Relation Age of Onset  . Stroke Mother   . Stroke Father     Social History Social History   Tobacco Use  . Smoking status: Never Smoker  . Smokeless tobacco: Never Used  Substance Use Topics  . Alcohol use: No  . Drug use: No     Allergies   Patient has no known allergies.   Review of Systems Review of Systems  All other systems reviewed and are negative.    Physical Exam Updated Vital Signs BP (!) 167/91   Pulse 70   Resp 19   SpO2 100%   Physical Exam Vitals signs and nursing note reviewed.  Constitutional:      General: He is not in acute distress.    Appearance: He is well-developed.     Comments: Elderly male, in no acute discomfort.  HENT:     Head: Normocephalic.     Comments: Nose is rightward deviated.  Dried blood noted in the left nares.  No septal hematoma.  Ecchymosis noted to left orbital region.  Mild tenderness to midface.  No malocclusion, no hemotympanum, no dental pain. Eyes:     Extraocular Movements: Extraocular movements intact.     Conjunctiva/sclera: Conjunctivae normal.     Pupils: Pupils are equal, round, and reactive to light.     Comments: No restriction of eye movement and no pain  with eye movement.  Neck:     Musculoskeletal: Neck supple.  Cardiovascular:     Rate and Rhythm: Normal rate and regular rhythm.     Heart sounds: Murmur present.  Musculoskeletal:        General: Tenderness (Right ankle: Tenderness to anterior ankle with normal range of motion.  Right foot: Ecchymosis noted to the second and third toe with tenderness to palpation.) present.  Skin:    Findings: No rash.  Neurological:     Mental Status: He is alert.      ED Treatments / Results  Labs (all labs ordered are listed, but only abnormal results are displayed) Labs Reviewed  PROTIME-INR - Abnormal; Notable for the following components:      Result Value   Prothrombin Time 23.9 (*)    All other components within normal limits  URINALYSIS, ROUTINE W REFLEX MICROSCOPIC    EKG EKG Interpretation  Date/Time:  Wednesday June 27 2018 16:48:02 EST Ventricular Rate:  70 PR Interval:    QRS Duration: 163 QT Interval:  441 QTC Calculation: 476 R Axis:   -79 Text Interpretation:  atrial flutter with ventricular paced rhythm Confirmed by Virgel Manifold [phone]) on 06/27/2018 4:51:49 PM   Radiology Ct Head Wo Contrast  Result Date: 06/27/2018 CLINICAL DATA:  Fall.  Head trauma EXAM: CT HEAD WITHOUT CONTRAST CT MAXILLOFACIAL WITHOUT CONTRAST CT CERVICAL SPINE WITHOUT CONTRAST TECHNIQUE: Multidetector CT imaging of the head, cervical spine, and maxillofacial structures were performed using the standard protocol without intravenous contrast. Multiplanar CT image reconstructions of the cervical spine and maxillofacial structures were also generated. COMPARISON:  CT head 10/01/2012 FINDINGS: CT HEAD FINDINGS Brain: Generalized atrophy most prominent in the temporal lobes bilaterally. Negative for hydrocephalus. Mild chronic appearing white matter changes Negative for acute hemorrhage, infarct, or mass. No midline shift. Vascular: Negative for hyperdense vessel Skull: Negative for skull  fracture. Other: None CT MAXILLOFACIAL FINDINGS Osseous: Multiple left-sided facial fractures Comminuted fracture of the nasal bone. Fracture of the nasal septum with displacement to the left Depressed fracture anterior wall of the left maxillary sinus  Fractured inferior orbital rim on the left Orbits: Bilateral cataract surgery.  No orbital hematoma Sinuses: Air-fluid level left maxillary sinus. Mucosal edema throughout the maxillary sinuses. Soft tissues: Extensive soft tissue swelling left nose and maxilla. CT CERVICAL SPINE FINDINGS Alignment: 2 mm anterolisthesis C2-3.  Mild anterolisthesis C3-4 Skull base and vertebrae: Negative for fracture Soft tissues and spinal canal: Negative for soft tissue mass or hematoma Disc levels: Multilevel disc degeneration and spondylosis throughout the cervical spine causing foraminal encroachment bilaterally and spinal stenosis at multiple levels. Upper chest: Lung apices clear bilaterally. Other: None IMPRESSION: 1. Atrophy and chronic microvascular ischemia. No acute intracranial abnormality. 2. Multiple facial fractures including nasal bone , nasal septum, left maxillary sinus, and left inferior orbital rim 3. Cervical spondylosis.  Negative for cervical fracture. Electronically Signed   By: Franchot Gallo M.D.   On: 06/27/2018 16:25   Ct Cervical Spine Wo Contrast  Result Date: 06/27/2018 CLINICAL DATA:  Fall.  Head trauma EXAM: CT HEAD WITHOUT CONTRAST CT MAXILLOFACIAL WITHOUT CONTRAST CT CERVICAL SPINE WITHOUT CONTRAST TECHNIQUE: Multidetector CT imaging of the head, cervical spine, and maxillofacial structures were performed using the standard protocol without intravenous contrast. Multiplanar CT image reconstructions of the cervical spine and maxillofacial structures were also generated. COMPARISON:  CT head 10/01/2012 FINDINGS: CT HEAD FINDINGS Brain: Generalized atrophy most prominent in the temporal lobes bilaterally. Negative for hydrocephalus. Mild chronic  appearing white matter changes Negative for acute hemorrhage, infarct, or mass. No midline shift. Vascular: Negative for hyperdense vessel Skull: Negative for skull fracture. Other: None CT MAXILLOFACIAL FINDINGS Osseous: Multiple left-sided facial fractures Comminuted fracture of the nasal bone. Fracture of the nasal septum with displacement to the left Depressed fracture anterior wall of the left maxillary sinus Fractured inferior orbital rim on the left Orbits: Bilateral cataract surgery.  No orbital hematoma Sinuses: Air-fluid level left maxillary sinus. Mucosal edema throughout the maxillary sinuses. Soft tissues: Extensive soft tissue swelling left nose and maxilla. CT CERVICAL SPINE FINDINGS Alignment: 2 mm anterolisthesis C2-3.  Mild anterolisthesis C3-4 Skull base and vertebrae: Negative for fracture Soft tissues and spinal canal: Negative for soft tissue mass or hematoma Disc levels: Multilevel disc degeneration and spondylosis throughout the cervical spine causing foraminal encroachment bilaterally and spinal stenosis at multiple levels. Upper chest: Lung apices clear bilaterally. Other: None IMPRESSION: 1. Atrophy and chronic microvascular ischemia. No acute intracranial abnormality. 2. Multiple facial fractures including nasal bone , nasal septum, left maxillary sinus, and left inferior orbital rim 3. Cervical spondylosis.  Negative for cervical fracture. Electronically Signed   By: Franchot Gallo M.D.   On: 06/27/2018 16:25   Ct Maxillofacial Wo Contrast  Result Date: 06/27/2018 CLINICAL DATA:  Fall.  Head trauma EXAM: CT HEAD WITHOUT CONTRAST CT MAXILLOFACIAL WITHOUT CONTRAST CT CERVICAL SPINE WITHOUT CONTRAST TECHNIQUE: Multidetector CT imaging of the head, cervical spine, and maxillofacial structures were performed using the standard protocol without intravenous contrast. Multiplanar CT image reconstructions of the cervical spine and maxillofacial structures were also generated. COMPARISON:  CT  head 10/01/2012 FINDINGS: CT HEAD FINDINGS Brain: Generalized atrophy most prominent in the temporal lobes bilaterally. Negative for hydrocephalus. Mild chronic appearing white matter changes Negative for acute hemorrhage, infarct, or mass. No midline shift. Vascular: Negative for hyperdense vessel Skull: Negative for skull fracture. Other: None CT MAXILLOFACIAL FINDINGS Osseous: Multiple left-sided facial fractures Comminuted fracture of the nasal bone. Fracture of the nasal septum with displacement to the left Depressed fracture anterior wall of the left maxillary sinus Fractured  inferior orbital rim on the left Orbits: Bilateral cataract surgery.  No orbital hematoma Sinuses: Air-fluid level left maxillary sinus. Mucosal edema throughout the maxillary sinuses. Soft tissues: Extensive soft tissue swelling left nose and maxilla. CT CERVICAL SPINE FINDINGS Alignment: 2 mm anterolisthesis C2-3.  Mild anterolisthesis C3-4 Skull base and vertebrae: Negative for fracture Soft tissues and spinal canal: Negative for soft tissue mass or hematoma Disc levels: Multilevel disc degeneration and spondylosis throughout the cervical spine causing foraminal encroachment bilaterally and spinal stenosis at multiple levels. Upper chest: Lung apices clear bilaterally. Other: None IMPRESSION: 1. Atrophy and chronic microvascular ischemia. No acute intracranial abnormality. 2. Multiple facial fractures including nasal bone , nasal septum, left maxillary sinus, and left inferior orbital rim 3. Cervical spondylosis.  Negative for cervical fracture. Electronically Signed   By: Franchot Gallo M.D.   On: 06/27/2018 16:25    Procedures Procedures (including critical care time)  Medications Ordered in ED Medications  oxyCODONE-acetaminophen (PERCOCET/ROXICET) 5-325 MG per tablet 1 tablet (1 tablet Oral Not Given 06/27/18 1728)     Initial Impression / Assessment and Plan / ED Course  I have reviewed the triage vital signs and the  nursing notes.  Pertinent labs & imaging results that were available during my care of the patient were reviewed by me and considered in my medical decision making (see chart for details).    BP (!) 165/85   Pulse 72   Resp 19   SpO2 100%    Final Clinical Impressions(s) / ED Diagnoses   Final diagnoses:  Closed fracture of facial bone due to fall, initial encounter [facility])    ED Discharge Orders         Ordered    traMADol (ULTRAM) 50 MG tablet  Every 6 hours PRN     06/27/18 1738         3:36 PM Patient had a mechanical fall earlier today, striking his head against a cabinet.  He does have ecchymosis to his left orbital region, his nose is rightward deviated concerning for nasal bone fracture and possible orbital bone fracture.  No loss of consciousness but patient is currently on Coumadin.  Patient will benefit from advanced imaging including maxillofacial, head and cervical spine CT.  He does not have any significant midline spine tenderness.  He also has tenderness to his right ankle and right toes from a prior fall.  He said that the injury is not related to this particular father for no further imaging to that affected area.  He did have a nosebleed but that has since subsided.  Will check INR.  EKG and UA ordered.  Patient has a Medtronic pacemaker, however this is likely a mechanical fall.  Care discussed with Dr. Wilson Singer.   5:34 PM CT scan demonstrate multiple facial fractures including the nasal bone, nasal septum, left maxillary sinus, and left inferior orbital rim.  No cervical spine fracture.  INR is subtherapeutic at 2.17.  Patient does have an ENT specialist, Dr. Constance Holster, in which patient should follow-up for further management.  Patient will be discharged home with tramadol as needed for pain.  Encourage avoid coughing or sneezing, or blowing his nose.   Domenic Moras, PA-C 06/27/18 1744    Virgel Manifold, MD 06/28/18 [phone]

## 2018-06-27 NOTE — ED Notes (Signed)
Patient verbalizes understanding of discharge instructions. Opportunity for questioning and answers were provided. Armband removed by staff, pt discharged from ED ambulatory to home.  

## 2018-06-27 NOTE — ED Provider Notes (Signed)
Medical screening examination/treatment/procedure(s) were conducted as a shared visit with non-physician practitioner(s) and myself.  I personally evaluated the patient during the encounter.  EKG Interpretation  Date/Time:  Wednesday June 27 2018 16:48:02 EST Ventricular Rate:  70 PR Interval:    QRS Duration: 163 QT Interval:  441 QTC Calculation: 476 R Axis:   -79 Text Interpretation:  atrial flutter with ventricular paced rhythm Confirmed by Virgel Manifold [phone]) on 06/27/2018 4:51:49 PM  Fall on coumdain. Multiple facial fxs. Head CT otherwise ok. INR therapeutic. Can continue. Would avoid nasal cpap for at least a few days.    Virgel Manifold, MD 06/27/18 1740

## 2018-06-27 NOTE — ED Notes (Signed)
Patient transported to CT 

## 2018-06-27 NOTE — Discharge Instructions (Signed)
You have been evaluated for your fall.  Your CT shows you have the following broken bones: nasal bone, nasal septum, left maxillary sinus and left inferior orbital rim.  Please avoid blowing your nose.  Take tramadol as needed for pain.  Call and follow up closely with ENT specialist Dr. Constance Holster in the next few days for further management.  Your INR today is 2.17.

## 2018-06-28 ENCOUNTER — Telehealth: Payer: Self-pay | Admitting: *Deleted

## 2018-06-28 NOTE — Telephone Encounter (Signed)
Pharmacy called to verify pt would get this via mail order.  [facility] reviewed chart and did not find a written Rx provided; advised pharmacy to contact pt.

## 2018-07-02 DIAGNOSIS — M47816 Spondylosis without myelopathy or radiculopathy, lumbar region: Secondary | ICD-10-CM | POA: Diagnosis not present

## 2018-07-02 DIAGNOSIS — S022XXA Fracture of nasal bones, initial encounter for closed fracture: Secondary | ICD-10-CM | POA: Diagnosis not present

## 2018-07-02 DIAGNOSIS — F0390 Unspecified dementia without behavioral disturbance: Secondary | ICD-10-CM | POA: Diagnosis not present

## 2018-07-02 DIAGNOSIS — N183 Chronic kidney disease, stage 3 (moderate): Secondary | ICD-10-CM | POA: Diagnosis not present

## 2018-07-02 DIAGNOSIS — I4891 Unspecified atrial fibrillation: Secondary | ICD-10-CM | POA: Diagnosis not present

## 2018-07-02 DIAGNOSIS — M152 Bouchard's nodes (with arthropathy): Secondary | ICD-10-CM | POA: Diagnosis not present

## 2018-07-02 DIAGNOSIS — J40 Bronchitis, not specified as acute or chronic: Secondary | ICD-10-CM | POA: Diagnosis not present

## 2018-07-02 DIAGNOSIS — N400 Enlarged prostate without lower urinary tract symptoms: Secondary | ICD-10-CM | POA: Diagnosis not present

## 2018-07-03 DIAGNOSIS — R040 Epistaxis: Secondary | ICD-10-CM | POA: Diagnosis not present

## 2018-07-03 DIAGNOSIS — S022XXD Fracture of nasal bones, subsequent encounter for fracture with routine healing: Secondary | ICD-10-CM | POA: Diagnosis not present

## 2018-07-03 DIAGNOSIS — H938X3 Other specified disorders of ear, bilateral: Secondary | ICD-10-CM | POA: Diagnosis not present

## 2018-07-03 DIAGNOSIS — I4891 Unspecified atrial fibrillation: Secondary | ICD-10-CM | POA: Diagnosis not present

## 2018-07-03 DIAGNOSIS — Z7901 Long term (current) use of anticoagulants: Secondary | ICD-10-CM | POA: Diagnosis not present

## 2018-07-04 DIAGNOSIS — G4733 Obstructive sleep apnea (adult) (pediatric): Secondary | ICD-10-CM | POA: Diagnosis not present

## 2018-07-05 ENCOUNTER — Telehealth: Payer: Self-pay | Admitting: Internal Medicine

## 2018-07-05 NOTE — Telephone Encounter (Signed)
Ok to restart CPAP with nasal pillows

## 2018-07-05 NOTE — Telephone Encounter (Signed)
Message recd from answering service (07/03/2018 at 4:44 pm).  Message states "Broke nose on Christmas. He was just told he could start using his CPAP again, but he's afraid he's going to get blood in the nose piece.  He doesn't have a mask attachment to avoid this problem. Please call him when you get back into the office on Thursday, 07/05/18 to advise what he should/can do, thank you."

## 2018-07-05 NOTE — Telephone Encounter (Signed)
Called and spoke with patient regarding use of cpap machine Pt broke his nose on christmas Day and was instructed by ENT to use CPAP in one week. Pt was calling today to see if he is able to use the cpap machine; he uses nasal pillows not the mask Pt is still having some bleeding issues with his nose Pt would like to know CY recommendation on this concern  CY please advise. Thank you.  [facility] Medications on File Prior to Visit  Medication Sig Dispense Refill  . acetaminophen (TYLENOL) 325 MG tablet Take 650 mg by mouth every 4 (four) hours as needed for mild pain or fever.     Marland Kitchen aspirin EC 81 MG tablet Take 81 mg by mouth daily.    . cycloSPORINE (RESTASIS) 0.05 % ophthalmic emulsion Place 1 drop into both eyes 2 (two) times daily.     Marland Kitchen doxazosin (CARDURA) 2 MG tablet Take 2 mg by mouth at bedtime.     . finasteride (PROSCAR) 5 MG tablet Take 5 mg by mouth daily.    . furosemide (LASIX) 40 MG tablet Take 40 mg by mouth daily.    Marland Kitchen galantamine (RAZADYNE) 8 MG tablet Take 8 mg by mouth 2 (two) times daily.     Marland Kitchen glucosamine-chondroitin 500-400 MG tablet Take 1 tablet by mouth 2 (two) times daily.     . Hypromellose (ISOPTO TEARS OP) Apply 1 drop to eye daily as needed.    Marland Kitchen ipratropium (ATROVENT) 0.06 % nasal spray Place 2 sprays into both nostrils 2 (two) times daily.    . metoprolol tartrate (LOPRESSOR) 25 MG tablet Take 25 mg by mouth 2 (two) times daily.    . Multiple Vitamin (MULTIVITAMIN) capsule Take 1 capsule by mouth daily.      . pantoprazole (PROTONIX) 40 MG tablet Take 40 mg by mouth daily.  5  . sertraline (ZOLOFT) 50 MG tablet Take 50 mg by mouth daily.    . traMADol (ULTRAM) 50 MG tablet Take 1 tablet (50 mg total) by mouth every 6 (six) hours as needed. 15 tablet 0  . warfarin (COUMADIN) 5 MG tablet Take 1 tablet (5 mg total) by mouth daily. Take as directed by coumadin clinic 90 tablet 0   No [facility] facility-administered medications on file prior to visit.     No Known Allergies

## 2018-07-05 NOTE — Telephone Encounter (Signed)
Patient returned phone call; pt contact # [phone]

## 2018-07-05 NOTE — Telephone Encounter (Signed)
Attempted to contact pt. I did not receive an answer. I have left a message for pt to return our call.  

## 2018-07-06 DIAGNOSIS — F0390 Unspecified dementia without behavioral disturbance: Secondary | ICD-10-CM | POA: Diagnosis not present

## 2018-07-06 NOTE — Telephone Encounter (Signed)
Spoke with pt. He is aware of CY's response. Nothing further was needed.

## 2018-07-07 ENCOUNTER — Other Ambulatory Visit: Payer: Self-pay

## 2018-07-07 ENCOUNTER — Emergency Department (HOSPITAL_COMMUNITY)
Admission: EM | Admit: 2018-07-07 | Discharge: 2018-07-07 | Disposition: A | Discharge status: Home / Self Care | Payer: PPO | Attending: Emergency Medicine | Admitting: Emergency Medicine

## 2018-07-07 ENCOUNTER — Encounter (HOSPITAL_COMMUNITY): Payer: Self-pay | Admitting: Emergency Medicine

## 2018-07-07 DIAGNOSIS — Z8673 Personal history of transient ischemic attack (TIA), and cerebral infarction without residual deficits: Secondary | ICD-10-CM | POA: Insufficient documentation

## 2018-07-07 DIAGNOSIS — R040 Epistaxis: Secondary | ICD-10-CM

## 2018-07-07 DIAGNOSIS — D509 Iron deficiency anemia, unspecified: Secondary | ICD-10-CM | POA: Diagnosis not present

## 2018-07-07 DIAGNOSIS — N289 Disorder of kidney and ureter, unspecified: Secondary | ICD-10-CM | POA: Diagnosis not present

## 2018-07-07 DIAGNOSIS — Z7901 Long term (current) use of anticoagulants: Secondary | ICD-10-CM | POA: Diagnosis not present

## 2018-07-07 DIAGNOSIS — I4821 Permanent atrial fibrillation: Secondary | ICD-10-CM | POA: Insufficient documentation

## 2018-07-07 DIAGNOSIS — D649 Anemia, unspecified: Secondary | ICD-10-CM | POA: Diagnosis not present

## 2018-07-07 DIAGNOSIS — I129 Hypertensive chronic kidney disease with stage 1 through stage 4 chronic kidney disease, or unspecified chronic kidney disease: Secondary | ICD-10-CM | POA: Diagnosis not present

## 2018-07-07 DIAGNOSIS — Z79899 Other long term (current) drug therapy: Secondary | ICD-10-CM | POA: Insufficient documentation

## 2018-07-07 DIAGNOSIS — F0390 Unspecified dementia without behavioral disturbance: Secondary | ICD-10-CM | POA: Diagnosis not present

## 2018-07-07 DIAGNOSIS — D539 Nutritional anemia, unspecified: Secondary | ICD-10-CM

## 2018-07-07 DIAGNOSIS — N183 Chronic kidney disease, stage 3 (moderate): Secondary | ICD-10-CM | POA: Insufficient documentation

## 2018-07-07 DIAGNOSIS — Z7982 Long term (current) use of aspirin: Secondary | ICD-10-CM | POA: Insufficient documentation

## 2018-07-07 DIAGNOSIS — Z950 Presence of cardiac pacemaker: Secondary | ICD-10-CM | POA: Diagnosis not present

## 2018-07-07 LAB — BASIC METABOLIC PANEL
Anion gap: 5 (ref 5–15)
BUN: 24 mg/dL — ABNORMAL HIGH (ref 8–23)
CO2: 24 mmol/L (ref 22–32)
Calcium: 9.6 mg/dL (ref 8.9–10.3)
Chloride: 111 mmol/L (ref 98–111)
Creatinine, Ser: 1.25 mg/dL — ABNORMAL HIGH (ref 0.61–1.24)
GFR calc Af Amer: >60 mL/min (ref >60)
GFR calc non Af Amer: 52 mL/min — ABNORMAL LOW (ref >60)
Glucose, Bld: 119 mg/dL — ABNORMAL HIGH (ref 70–99)
Potassium: 4.1 mmol/L (ref 3.5–5.1)
Sodium: 140 mmol/L (ref 135–145)

## 2018-07-07 LAB — PROTIME-INR
INR: 2.19
Prothrombin Time: 24 seconds — ABNORMAL HIGH (ref 11.4–15.2)

## 2018-07-07 LAB — CBC WITH DIFFERENTIAL/PLATELET
Abs Immature Granulocytes: 0.03 10*3/uL (ref 0.00–0.07)
BASOS PCT: 1 %
Basophils Absolute: 0 10*3/uL (ref 0.0–0.1)
Eosinophils Absolute: 0.1 10*3/uL (ref 0.0–0.5)
Eosinophils Relative: 1 %
HCT: 33.8 % — ABNORMAL LOW (ref 39.0–52.0)
Hemoglobin: 10.3 g/dL — ABNORMAL LOW (ref 13.0–17.0)
IMMATURE GRANULOCYTES: 0 %
Lymphocytes Relative: 19 %
Lymphs Abs: 1.3 10*3/uL (ref 0.7–4.0)
MCH: 30.9 pg (ref 26.0–34.0)
MCHC: 30.5 g/dL (ref 30.0–36.0)
MCV: 101.5 fL — AB (ref 80.0–100.0)
Monocytes Absolute: 0.8 10*3/uL (ref 0.1–1.0)
Monocytes Relative: 11 %
NEUTROS PCT: 68 %
Neutro Abs: 4.8 10*3/uL (ref 1.7–7.7)
Platelets: 203 10*3/uL (ref 150–400)
RBC: 3.33 MIL/uL — ABNORMAL LOW (ref 4.22–5.81)
RDW: 13.2 % (ref 11.5–15.5)
WBC: 7 10*3/uL (ref 4.0–10.5)
nRBC: 0 % (ref 0.0–0.2)

## 2018-07-07 MED ORDER — TRANEXAMIC ACID 1000 MG/10ML IV SOLN
500.0000 mg | Freq: Once | INTRAVENOUS | Status: DC
  Filled 2018-07-07: qty 10

## 2018-07-07 NOTE — ED Triage Notes (Signed)
C/o R nostril bleeding since 6pm.  Called ENT and put a cotton ball soaked with Afrin in x 3 without relief.  Pt seen in ED on 12/25 due to fall with nosebleed.  Seen by ENT in office on 12/31 and still had minor L nostril bleeding.

## 2018-07-07 NOTE — ED Provider Notes (Signed)
[facility] EMERGENCY DEPARTMENT Provider Note   CSN: [id number] Arrival date & time: 07/07/18  0010     History   Chief Complaint Chief Complaint  Patient presents with  . Epistaxis    HPI [name] is a 85 y.o. male.  The history is provided by the patient and a relative.  Epistaxis    He has history of hypertension, chronic kidney disease, stroke, atrial fibrillation anticoagulated on warfarin and comes in because of right-sided epistaxis.  He had fallen and broken his nose on 12/25.  He had left-sided epistaxis several days later and was seen by his ENT physician, treated with cautery.  Today, he had bleeding from the right side of his nose.  He has tried inserting cotton soaked in Afrin, but has been unable to control the bleeding.  Last INR was checked was about 1 week ago and was 2.1.  Past Medical History:  Diagnosis Date  . Allergic rhinitis   . Arthritis   . BPH (benign prostatic hyperplasia)   . Bronchitis   . Complete heart block ([place])    pacemaker originally placed in 1988  . Dementia ([place])   . Esophageal reflux   . Hypertension   . Lung nodule    noted on CXR December 2012  . Normal nuclear stress test 2013  . OSA (obstructive sleep apnea)    uses cpap  . Permanent atrial fibrillation    on coumadin  . Pneumonia   . Presence of permanent cardiac pacemaker   . Renal insufficiency    Rt kidney removed d/t maglinant tumor  . RLS (restless legs syndrome)   . Stroke [facility])    TIA greater than 20 years  . Transitional cell carcinoma ([place])    s/p nephrectomy in  2010    Patient Active Problem List   Diagnosis Date Noted  . Closed displaced fracture of left femoral neck ([place]) 12/05/2017  . Status post total replacement of left hip   . Benign essential HTN   . History of CVA (cerebrovascular accident)   . Cardiac pacemaker in situ   . Postoperative pain   . Acute blood loss anemia   . Closed left hip fracture ([place]) 11/29/2017    . Lower leg edema 11/29/2017  . Fall 11/29/2017  . Essential hypertension 11/29/2017  . Stroke (cerebrum) ([place]) 11/29/2017  . BPH (benign prostatic hyperplasia) 11/29/2017  . Dementia ([place]) 11/29/2017  . CKD (chronic kidney disease), [name] III ([place]) 11/29/2017  . Closed right hip fracture, initial encounter ([place]) 11/29/2017  . Diarrhea 11/29/2017  . Osteoarthritis of fifth carpometacarpal joint of left hand 07/18/2017  . Lung nodule 02/21/2016  . SIRS (systemic inflammatory response syndrome) ([place]) 01/02/2016  . Sepsis ([place]) 01/02/2016  . Encounter for therapeutic drug monitoring 09/23/2013  . Premature ventricular contraction 06/03/2013  . Pacemaker-Medtronic 01/04/2012  . Dyspnea 10/28/2011  . Complete heart block ([place]) 09/05/2011  . Long term ([place]) use of anticoagulants 02/04/2011  . BRADYCARDIA-TACHYCARDIA SYNDROME 08/16/2010  . ALLERGIC RHINITIS 12/24/2007  . Obstructive sleep apnea 08/17/2007  . RESTLESS LEG SYNDROME 08/17/2007  . FIBRILLATION, ATRIAL 08/17/2007  . Chronic bronchitis ([place]) 08/17/2007  . Esophageal reflux 08/17/2007  . Insomnia secondary to chronic pain 08/17/2007  . CHEYNE-STOKES RESPIRATION 08/17/2007    Past Surgical History:  Procedure Laterality Date  . CARDIOVASCULAR STRESS TEST  03/23/2009   EF 65%, NORMAL  . CATARACT EXTRACTION W/PHACO Left 08/26/2015   Procedure: CATARACT EXTRACTION PHACO AND INTRAOCULAR LENS PLACEMENT (  [place]);  Surgeon: Marylynn Pearson, MD;  Location: [facility];  Service: Ophthalmology;  Laterality: Left;  . DOPPLER ECHOCARDIOGRAPHY  03/26/2001   EF 55%  . EYE SURGERY     tumor removed from Rt eye lid  . HERNIA REPAIR     multiple  . MASS EXCISION Left 11/07/2012   Procedure: MINOR EXCISION OF MASS;  Surgeon: Haywood Lasso, MD;  Location: [facility];  Service: General;  Laterality: Left;  . NEPHRECTOMY  2010   rt  . PACEMAKER INSERTION  1988   most recent generator change by Dr Rayann Heman 20067/2/13 with new right  ventricular lead placed  . PERMANENT PACEMAKER GENERATOR CHANGE N/A 01/03/2012   Procedure: PERMANENT PACEMAKER GENERATOR CHANGE;  Surgeon: Thompson Grayer, MD;  Location: [facility] CATH LAB;  Service: Cardiovascular;  Laterality: N/A;  . TOTAL HIP ARTHROPLASTY Left 12/04/2017   Procedure: TOTAL HIP ARTHROPLASTY ANTERIOR APPROACH WITH CABLE;  Surgeon: Rod Can, MD;  Location: [facility];  Service: Orthopedics;  Laterality: Left;  . US ECHOCARDIOGRAPHY  04/22/2008   EF 55-60%        Home Medications    Prior to Admission medications   Medication Sig Start Date End Date Taking? Authorizing Provider  acetaminophen (TYLENOL) 325 MG tablet Take 650 mg by mouth every 4 (four) hours as needed for mild pain or fever.     [provider]  aspirin EC 81 MG tablet Take 81 mg by mouth daily.    [provider]  cycloSPORINE (RESTASIS) 0.05 % ophthalmic emulsion Place 1 drop into both eyes 2 (two) times daily.     [provider]  doxazosin (CARDURA) 2 MG tablet Take 2 mg by mouth at bedtime.     [provider]  finasteride (PROSCAR) 5 MG tablet Take 5 mg by mouth daily.    [provider]  furosemide (LASIX) 40 MG tablet Take 40 mg by mouth daily.    [provider]  galantamine (RAZADYNE) 8 MG tablet Take 8 mg by mouth 2 (two) times daily.     [provider]  glucosamine-chondroitin 500-400 MG tablet Take 1 tablet by mouth 2 (two) times daily.     [provider]  Hypromellose (ISOPTO TEARS OP) Apply 1 drop to eye daily as needed.    [provider]  ipratropium (ATROVENT) 0.06 % nasal spray Place 2 sprays into both nostrils 2 (two) times daily.    [provider]  metoprolol tartrate (LOPRESSOR) 25 MG tablet Take 25 mg by mouth 2 (two) times daily.    [provider]  Multiple Vitamin (MULTIVITAMIN) capsule Take 1 capsule by mouth daily.      [provider]  pantoprazole (PROTONIX) 40 MG tablet Take  40 mg by mouth daily. 02/05/18   [provider]  sertraline (ZOLOFT) 50 MG tablet Take 50 mg by mouth daily.    [provider]  traMADol (ULTRAM) 50 MG tablet Take 1 tablet (50 mg total) by mouth every 6 (six) hours as needed. 06/27/18   Domenic Moras, PA-C  warfarin (COUMADIN) 5 MG tablet Take 1 tablet (5 mg total) by mouth daily. Take as directed by coumadin clinic 03/14/18   Thompson Grayer, MD    Family History Family History  Problem Relation Age of Onset  . Stroke Mother   . Stroke Father     Social History Social History   Tobacco Use  . Smoking status: Never Smoker  . Smokeless tobacco: Never Used  Substance Use Topics  . Alcohol use: No  . Drug use: No     Allergies   Patient has no known allergies.   Review of Systems Review of Systems  HENT: Positive for nosebleeds.   All other systems reviewed and are negative.    Physical Exam Updated Vital Signs BP (!) 168/83 (BP Location: Right Arm)   Pulse 73   Temp 97.9 F (36.6 C) (Axillary)   Resp (!) 22   SpO2 100%   Physical Exam Vitals signs and nursing note reviewed.    85 year old male, resting comfortably and in no acute distress. Vital signs are significant for elevated blood pressure and elevated respiratory rate. Oxygen saturation is 100%, which is normal. Head is normocephalic and atraumatic. PERRLA, EOMI. Right nares is occluded with a clot with slight dripping of blood.  Oropharynx has a small amount of blood dripping down from the nasopharynx. Neck is nontender and supple without adenopathy or JVD. Back is nontender and there is no CVA tenderness. Lungs are clear without rales, wheezes, or rhonchi. Chest is nontender. Heart has regular rate and rhythm with 3/6 holosystolic murmur. Abdomen is soft, flat, nontender without masses or hepatosplenomegaly and peristalsis is normoactive. Extremities have no cyanosis or edema, full range of motion is present. Skin is warm and dry without  rash. Neurologic: Mental status is normal, cranial nerves are intact, there are no motor or sensory deficits.  ED Treatments / Results  Labs (all labs ordered are listed, but only abnormal results are displayed) Labs Reviewed  CBC WITH DIFFERENTIAL/PLATELET - Abnormal; Notable for the following components:      Result Value   RBC 3.33 (*)    Hemoglobin 10.3 (*)    HCT 33.8 (*)    MCV 101.5 (*)    All other components within normal limits  PROTIME-INR - Abnormal; Notable for the following components:   Prothrombin Time 24.0 (*)    All other components within normal limits  BASIC METABOLIC PANEL - Abnormal; Notable for the following components:   Glucose, Bld 119 (*)    BUN 24 (*)    Creatinine, Ser 1.25 (*)    GFR calc non Af Amer 52 (*)    All other components within normal limits   Procedures .Epistaxis Management Date/Time: 07/07/2018 3:16 AM Performed by: Delora Fuel, MD Authorized by: Delora Fuel, MD   Consent:    Consent obtained:  Verbal   Consent given by:  Patient   Risks discussed:  Pain   Alternatives discussed:  Alternative treatment Anesthesia (see MAR for exact dosages):    Anesthesia method:  None Procedure details:    Treatment site:  R anterior   Repair method: Topical tranexamic acid.   Treatment complexity:  Limited   Treatment episode: initial   Post-procedure details:    Assessment:  Bleeding stopped   Patient tolerance of procedure:  Tolerated well, no immediate complications     Medications Ordered in ED Medications  tranexamic acid (CYKLOKAPRON) injection 500 mg (has no administration in time range)     Initial Impression / Assessment and Plan / ED Course  I have reviewed the triage vital signs and the nursing notes.  Pertinent lab results that were available during my care of the patient were reviewed by me and considered in my medical decision making (see chart for details).  Right-sided epistaxis in patient who is approximately 10  days status post closed nasal fracture.  Old records reviewed confirming ED visit  for nasal fracture, and office visit for left-sided epistaxis on 12/31 and silver nitrate cautery in the office.  Will check INR today, will try treatment with topical tranexamic acid.  INR is therapeutic.  Labs show anemia which is actually improved over baseline, and renal insufficiency which is also improved over baseline.  Following tranexamic acid application, bleeding has stopped.  He was observed in the ED for an additional 45 minutes with no recurrence of bleeding.  He was felt to be safe for discharge.  Unpredictability of nosebleeds was explained including possibility of recurrent bleeding at any time.  He is to follow-up with his ENT physician in 2 days, return to ED if there is recurrent bleeding.  Final Clinical Impressions(s) / ED Diagnoses   Final diagnoses:  Right-sided epistaxis  Anticoagulated on warfarin  Renal insufficiency  Macrocytic anemia    ED Discharge Orders    None       Delora Fuel, MD 37/34/28 [phone]

## 2018-07-07 NOTE — Discharge Instructions (Addendum)
Return if there are any problems.

## 2018-07-09 DIAGNOSIS — J342 Deviated nasal septum: Secondary | ICD-10-CM | POA: Diagnosis not present

## 2018-07-09 DIAGNOSIS — R040 Epistaxis: Secondary | ICD-10-CM | POA: Diagnosis not present

## 2018-07-09 DIAGNOSIS — Z7901 Long term (current) use of anticoagulants: Secondary | ICD-10-CM | POA: Diagnosis not present

## 2018-07-11 DIAGNOSIS — G4733 Obstructive sleep apnea (adult) (pediatric): Secondary | ICD-10-CM | POA: Diagnosis not present

## 2018-07-16 DIAGNOSIS — Z7901 Long term (current) use of anticoagulants: Secondary | ICD-10-CM | POA: Diagnosis not present

## 2018-07-16 DIAGNOSIS — R040 Epistaxis: Secondary | ICD-10-CM | POA: Diagnosis not present

## 2018-07-16 DIAGNOSIS — J342 Deviated nasal septum: Secondary | ICD-10-CM | POA: Diagnosis not present

## 2018-08-01 ENCOUNTER — Ambulatory Visit: Payer: PPO | Admitting: *Deleted

## 2018-08-01 DIAGNOSIS — I4891 Unspecified atrial fibrillation: Secondary | ICD-10-CM | POA: Diagnosis not present

## 2018-08-01 DIAGNOSIS — Z5181 Encounter for therapeutic drug level monitoring: Secondary | ICD-10-CM

## 2018-08-01 LAB — POCT INR: INR: 3.3 — AB (ref 2.0–3.0)

## 2018-08-01 NOTE — Patient Instructions (Signed)
Description   Do not take any Coumadin today then continue taking 5mg  (1 tablet) every day except 1/2 tablet (2.5mg ) on Mondays.  Recheck INR in 3 weeks (usually 6 weeks).  Call with any new medications or if scheduled for any procedures  [phone]

## 2018-08-02 DIAGNOSIS — F0390 Unspecified dementia without behavioral disturbance: Secondary | ICD-10-CM | POA: Diagnosis not present

## 2018-08-02 DIAGNOSIS — M152 Bouchard's nodes (with arthropathy): Secondary | ICD-10-CM | POA: Diagnosis not present

## 2018-08-02 DIAGNOSIS — J40 Bronchitis, not specified as acute or chronic: Secondary | ICD-10-CM | POA: Diagnosis not present

## 2018-08-02 DIAGNOSIS — M47816 Spondylosis without myelopathy or radiculopathy, lumbar region: Secondary | ICD-10-CM | POA: Diagnosis not present

## 2018-08-02 DIAGNOSIS — N400 Enlarged prostate without lower urinary tract symptoms: Secondary | ICD-10-CM | POA: Diagnosis not present

## 2018-08-02 DIAGNOSIS — G301 Alzheimer's disease with late onset: Secondary | ICD-10-CM | POA: Diagnosis not present

## 2018-08-02 DIAGNOSIS — N183 Chronic kidney disease, stage 3 (moderate): Secondary | ICD-10-CM | POA: Diagnosis not present

## 2018-08-02 DIAGNOSIS — I4891 Unspecified atrial fibrillation: Secondary | ICD-10-CM | POA: Diagnosis not present

## 2018-08-06 ENCOUNTER — Encounter: Payer: Self-pay | Admitting: Neurology

## 2018-08-06 ENCOUNTER — Encounter: Payer: Self-pay | Admitting: *Deleted

## 2018-08-06 ENCOUNTER — Ambulatory Visit (INDEPENDENT_AMBULATORY_CARE_PROVIDER_SITE_OTHER): Payer: PPO | Admitting: Neurology

## 2018-08-06 VITALS — BP 110/63 | HR 71 | Ht 69.5 in | Wt 168.0 lb

## 2018-08-06 DIAGNOSIS — F02818 Dementia in other diseases classified elsewhere, unspecified severity, with other behavioral disturbance: Secondary | ICD-10-CM

## 2018-08-06 DIAGNOSIS — G301 Alzheimer's disease with late onset: Secondary | ICD-10-CM | POA: Diagnosis not present

## 2018-08-06 DIAGNOSIS — F0281 Dementia in other diseases classified elsewhere with behavioral disturbance: Secondary | ICD-10-CM | POA: Diagnosis not present

## 2018-08-06 MED ORDER — DIVALPROEX SODIUM ER 250 MG PO TB24: 250.0000 mg | ORAL_TABLET | Freq: Every day | ORAL | 11 refills | Status: DC

## 2018-08-06 NOTE — Patient Instructions (Addendum)
Start Depakote for behavioral changes Continue the galantamine Consider Namenda (Memantine) Labs today Eino Farber at [facility]  Valproic Acid, Divalproex Sodium delayed or extended-release tablets What is this medicine? DIVALPROEX SODIUM (dye VAL pro ex SO dee um) is used to prevent seizures caused by some forms of epilepsy. It is also used to treat bipolar mania, treat behavioral symptoms in dementia and to prevent migraine headaches. This medicine may be used for other purposes; ask your health care provider or pharmacist if you have questions. COMMON BRAND NAME(S): Depakote, Depakote ER What should I tell my health care provider before I take this medicine? They need to know if you have any of these conditions: -if you often drink alcohol -kidney disease -liver disease -low platelet counts -mitochondrial disease -suicidal thoughts, plans, or attempt; a previous suicide attempt by you or a family member -urea cycle disorder (UCD) -an unusual or allergic reaction to divalproex sodium, sodium valproate, valproic acid, other medicines, foods, dyes, or preservatives -pregnant or trying to get pregnant -breast-feeding How should I use this medicine? Take this medicine by mouth with a drink of water. Follow the directions on the prescription label. Do not cut, crush or chew this medicine. You can take it with or without food. If it upsets your stomach, take it with food. Take your medicine at regular intervals. Do not take it more often than directed. Do not stop taking except on your doctor's advice. A special MedGuide will be given to you by the pharmacist with each prescription and refill. Be sure to read this information carefully each time. Talk to your pediatrician regarding the use of this medicine in children. While this drug may be prescribed for children as young as 10 years for selected conditions, precautions do apply. Overdosage: If you think you have taken too much of this  medicine contact a poison control center or emergency room at once. NOTE: This medicine is only for you. Do not share this medicine with others. What if I miss a dose? If you miss a dose, take it as soon as you can. If it is almost time for your next dose, take only that dose. Do not take double or extra doses. What may interact with this medicine? Do not take this medicine with any of the following medications: -sodium phenylbutyrate This medicine may also interact with the following medications: -aspirin -certain antibiotics like ertapenem, imipenem, meropenem -certain medicines for depression, anxiety, or psychotic disturbances -certain medicines for seizures like carbamazepine, clonazepam, diazepam, ethosuximide, felbamate, lamotrigine, phenobarbital, phenytoin, primidone, rufinamide, topiramate -certain medicines that treat or prevent blood clots like warfarin -cholestyramine -male hormones, like estrogens and birth control pills, patches, or rings -propofol -rifampin -ritonavir -tolbutamide -zidovudine This list may not describe all possible interactions. Give your health care provider a list of all the medicines, herbs, non-prescription drugs, or dietary supplements you use. Also tell them if you smoke, drink alcohol, or use illegal drugs. Some items may interact with your medicine. What should I watch for while using this medicine? Tell your doctor or health care professional if your symptoms do not get better or they start to get worse. Wear a medical ID bracelet or chain, and carry a card that describes your disease and details of your medicine and dosage times. You may get drowsy, dizzy, or have blurred vision. Do not drive, use machinery, or do anything that needs mental alertness until you know how this medicine affects you. To reduce dizzy or fainting spells, do not sit  or stand up quickly, especially if you are an older patient. Alcohol can increase drowsiness and dizziness.  Avoid alcoholic drinks. This medicine can make you more sensitive to the sun. Keep out of the sun. If you cannot avoid being in the sun, wear protective clothing and use sunscreen. Do not use sun lamps or tanning beds/booths. Patients and their families should watch out for new or worsening depression or thoughts of suicide. Also watch out for sudden changes in feelings such as feeling anxious, agitated, panicky, irritable, hostile, aggressive, impulsive, severely restless, overly excited and hyperactive, or not being able to sleep. If this happens, especially at the beginning of treatment or after a change in dose, call your health care professional. Women should inform their doctor if they wish to become pregnant or think they might be pregnant. There is a potential for serious side effects to an unborn child. Talk to your health care professional or pharmacist for more information. Women who become pregnant while using this medicine may enroll in the [place] Pregnancy Registry by calling [phone]. This registry collects information about the safety of antiepileptic drug use during pregnancy. This medicine may cause a decrease in folic acid and vitamin D. You should make sure that you get enough vitamins while you are taking this medicine. Discuss the foods you eat and the vitamins you take with your health care professional. What side effects may I notice from receiving this medicine? Side effects that you should report to your doctor or health care professional as soon as possible: -allergic reactions like skin rash, itching or hives, swelling of the face, lips, or tongue -changes in vision -redness, blistering, peeling or loosening of the skin, including inside the mouth -signs and symptoms of liver injury like dark yellow or brown urine; general ill feeling or flu-like symptoms; light-colored stools; loss of appetite; nausea; right upper belly pain; unusually weak or  tired; yellowing of the eyes or skin -suicidal thoughts or other mood changes -unusual bleeding or bruising Side effects that usually do not require medical attention (report to your doctor or health care professional if they continue or are bothersome): -constipation -diarrhea -dizziness -hair loss -headache -loss of appetite -weight gain This list may not describe all possible side effects. Call your doctor for medical advice about side effects. You may report side effects to FDA at 1-800-FDA-1088. Where should I keep my medicine? Keep out of reach of children. Store at room temperature between 15 and 30 degrees C (59 and 86 degrees F). Keep container tightly closed. Throw away any unused medicine after the expiration date. NOTE: This sheet is a summary. It may not cover all possible information. If you have questions about this medicine, talk to your doctor, pharmacist, or health care provider.  2019 Elsevier/Gold Standard (2017-03-13 10:48:27)  Common changes in behavior Many people find the changes in behavior caused by Alzheimer's to be the most challenging and distressing effect of the disease. The chief cause of behavioral symptoms is the progressive deterioration of brain cells. However, medication, environmental influences and some medical conditions also can cause symptoms or make them worse.  In early stages, people may experience behavior and personality changes such as: Irritability Anxiety Depression In later stages, other symptoms may occur including:  Aggression and Anger Anxiety and Agitation General emotional distress Physical or verbal outbursts Restlessness, pacing, shredding paper or tissues Hallucinations (seeing, hearing or feeling things that are not really there) Delusions (firmly held belief in things that are  not true) Sleep Issues and Sundowning Learn more:  Behaviors Caregivers and Coping Help is available. Your local Alzheimer's Association  chapter can connect you with the resources you need to cope with the challenges of Alzheimer's. Find a chapter in your community. Our free 24/7 Helpline provides information, referral and care consultation by professionals in more than 200 languages. [place] houses more than 5,000 books, journals and resources. Access it online. Triggering situations Events or changes in a person's surroundings often play a role in triggering behavioral symptoms.  Change can be stressful for anyone and can be especially difficult for a person with Alzheimer's disease. It can increase the fear and fatigue of trying to make sense out of an increasingly confusing world.  Situations affecting behavior may include:  Moving to a new residence or nursing home Changes in a familiar environment or caregiver arrangements Misperceived threats Admission to a hospital Being asked to bathe or change clothes Identifying what has triggered a behavior can often help in selecting the best approach to deal with it.  Medical evaluation for contributing factors Everyone who develops behavior changes should receive a thorough medical evaluation, especially if symptoms appear suddenly.  Even though the chief cause of behavioral symptoms is the effect of Alzheimer's disease on the brain, an examination may reveal other treatable conditions that are contributing to the behavior.  Difficulty with communication   Because people with Alzheimer's gradually lose the ability to communicate, it's important to regularly monitor their comfort and anticipate their needs.    Contributing conditions may include: Drug side effects. Many people with Alzheimer's take prescription medications for other health issues. Drug side effects or interactions among drugs can affect behavior. Discomfort from infections or other conditions. As the disease gets worse, those with Alzheimer's have increasing difficulty communicating with  others about their experience. As a result, they may be unable to report symptoms of common illnesses. Pain from infections of the urinary tract, ear or sinuses may lead to restlessness or agitation. Discomfort from a full bladder, constipation, or feeling too hot or too cold also may be expressed through behavior. Uncorrected problems with hearing or vision. These can contribute to confusion and frustration and foster a sense of isolation. Non-drug approaches Non-drug approaches to managing behavior symptoms promote physical and emotional comfort.  Many of these strategies aim to identify and address needs that the person with Alzheimer's may have difficulty expressing as the disease progresses. Non-drug approaches should always be tried first.  Steps to developing successful non-drug treatments include:  Recognizing that the person is not just "acting mean or ornery," but is having further symptoms of the disease Identifying the cause and how the symptom may relate to the experience of the person with Alzheimer's Changing the environment to resolve challenges and obstacles to comfort, security and ease of mind Coping tips Monitor personal comfort. Check for pain, hunger, thirst, constipation, full bladder, fatigue, infections and skin irritation. Maintain a comfortable room temperature. Avoid being confrontational or arguing about facts. For example, if a person expresses a wish to go visit a parent who died years ago, don't point out that the parent is dead. Instead, say, "Your mother is a wonderful person. I would like to see her too." Redirect the person's attention. Try to remain flexible, patient and supportive by responding to the emotion, not the behavior. Create a calm environment. Avoid noise, glare, insecure space and too much background distraction, including television. Allow adequate rest between stimulating events. Provide a  security object. Acknowledge requests, and respond to  them. Look for reasons behind each behavior. Consult a physician to identify any causes related to medications or illness. Explore various solutions. Don't take the behavior personally, and share your experiences with others. Our online social networking community can also help you. Join [employer] and learn tips for coping with a loved one's behavior and find support from other caregivers.  Medications for behavioral symptoms If non-drug approaches fail after being applied consistently, introducing medications may be appropriate for individuals with severe symptoms or who have the potential to harm themselves or others. While prescription medications can be effective in some situations, they must be used carefully and are most effective when combined with non-drug approaches.  When considering use of medications, it is important to understand that no drugs are specifically approved by the [place]. Food and Drug Administration (FDA) to treat behavioral and psychiatric dementia symptoms. Some of the examples discussed below represent "off label" use, a medical practice in which a physician may prescribe a drug for a different purpose than the ones for which it is approved.  Guiding principles The following general principles can help guide appropriate use of medications:  Know the risks and benefits. It's important to understand the potential benefits and risks of a medication before making treatment decisions. Target a specific symptom. Effective treatment of one core symptom may help relieve other symptoms. For example, some antidepressants may help people sleep better. Start with a low dose of a single drug and monitor closely for side effects. Side effects can be serious, and drugs can occasionally even worsen the symptom being treated. Dosage should not be increased without a careful evaluation by a healthcare professional. Learn more: Alzheimer's Association Statement Regarding Treatment of  Behavioral and Psychiatric Symptoms (PDF)  Medication examples  Some medications commonly used to treat behavioral and psychiatric symptoms of Alzheimer's disease, listed in alphabetical order by generic name, include the following:  Antidepressants for low mood and irritability:  citalopram (Celexa) fluoxetine (Prozac) paroxeine (Paxil) sertraline (Zoloft) trazodone (Desyrel) Anxiolytics for anxiety, restlessness, verbally disruptive behavior and resistance:  lorazepam (Ativan) oxazepam (Serax) Antipsychotic medications for hallucinations, delusions, aggression, agitation, hostility and uncooperativeness:  aripiprazole (Abilify) clozapine (Clozaril) haloperidol (Haldol) olanzapine (Zyprexa) quetiapine (Seroquel) risperidone (Risperdal) ziprasidone (Geodon) Antipsychotic medications The decision to use an antipsychotic drug needs to be considered with extreme caution. Research has shown that these drugs are associated with an increased risk of stroke and death in older adults with dementia. The FDA has ordered manufacturers to label such drugs with a "black box" warning about their risks and a reminder that they are not approved to treat dementia symptoms.  Based on scientific evidence, as well as governmental warnings and guidance from care oversight bodies, individuals with dementia should use antipsychotic medications only under one of the following conditions: Behavioral symptoms are due to mania or psychosis The symptoms present a danger to the person or others The person is experiencing inconsolable or persistent distress, a significant decline in function or substantial difficulty receiving needed care Antipsychotic medications should not be used to sedate or restrain persons with dementia. The minimum dosage should be used for the minimum amount of time possible. Adverse side effects require careful monitoring.  Although antipsychotics are the most frequently used  medications for agitation, some physicians may prescribe a seizure medication/mood stabilizer, such as:  carbamazepine (Tegretol)

## 2018-08-06 NOTE — Progress Notes (Signed)
[id number]    Provider:  Dr Jaynee Eagles Referring Provider: [name], MD Primary Care Provider:  [name], MD  CC: Dementia with behavioral disturbance  HPI:  [name] is a 85 y.o. male here as requested by provider [name], MD for dementia evaluation.  Past medical history of erectile dysfunction, impaired fasting glucose, pulmonary hypertension, chronic kidney disease stage III, long-term use of anticoagulant, high cholesterol, chronic fatigue, mild dementia on galantamine with some behavioral issues, low-dose sertraline is not helped.  He also has A. fib, obstructive sleep apnea on CPAP doing well, pacemaker. Patient here with his wife. Symptoms started in 2013 slowly progressive sine then. He was diagnosed with Alzheimers disease. He was seeing someone in [facility]. They saw Dr. Brett Fairy in 2013. He saw Dr. Vikki Ports at [facility] as well as Dr. Verdene Rio. He is having anger and agitation. Not helping. His personality is changing. Wife provides much information. He has changed in recent years. He gets very mad at his wife. Yesterday morning he was getting ready for church, wife doesn;t go anymore, he gets vey upset, screaming and yelling. He is getting closer to more physically, nudging her. Happening every day. He gets very agitated. His parents never really aged they had strokes earlier in life.   Reviewed notes, labs and imaging from outside physicians, which showed:  Reviewed notes from Dr. Laurann Montana, patient has been diagnosed with dementia on galantamine with some behavioral issues, low-dose sertraline has not helped.  No side effects.  It appears his sertraline was increased to 50 mg at last appointment.  He has a history of mild dementia.  He has some increased symptoms recently.  Short-term memory is very poor.  Cannot remember the names of new people he meets some of his older friends in his exercise group having trouble.  He is driving in circles on  several occasions.  He still very active going to exercise and swim class.  He has had some issues with giving out information inappropriately on the phone.  Had to cancel credit card several times.  He no longer has credit cards and computer and landline has been disconnected.  His wife thinks is been a change in his personality more irritable and sometimes saying inappropriate things.  Clonazepam was stopped.  Reviewed CT of the head : 1. Atrophy and chronic microvascular ischemia. No acute intracranial abnormality. 2. Multiple facial fractures including nasal bone , nasal septum, left maxillary sinus, and left inferior orbital rim 3. Cervical spondylosis.  Negative for cervical fracture.  Review of Systems: Patient complains of symptoms per HPI as well as the following symptoms: Easy bruising, joint pain, joint swelling, aching muscles, memory loss, confusion, difficulty swallowing, restless legs, decreased energy. Pertinent negatives and positives per HPI. All others negative.   Social History   Socioeconomic History  . Marital status: Married    Spouse name: Not on file  . Number of children: 3  . Years of education: 16  . Highest education level: Bachelor's degree (e.g., BA, AB, BS)  Occupational History  . Occupation: Retired Technical sales engineer: RETIRED  Social Needs  . Financial resource strain: Not on file  . Food insecurity:    Worry: Not on file    Inability: Not on file  . Transportation needs:    Medical: Not on file    Non-medical: Not on file  Tobacco Use  . Smoking status: Never Smoker  . Smokeless tobacco: Never Used  Substance and Sexual Activity  . Alcohol use: No  . Drug use: No  . Sexual activity: Not on file  Lifestyle  . Physical activity:    Days per week: Not on file    Minutes per session: Not on file  . Stress: Not on file  Relationships  . Social connections:    Talks on phone: Not on file    Gets together: Not on file    Attends  religious service: Not on file    Active member of club or organization: Not on file    Attends meetings of clubs or organizations: Not on file    Relationship status: Not on file  . Intimate partner violence:    Fear of Nearhood or ex partner: Not on file    Emotionally abused: Not on file    Physically abused: Not on file    Forced sexual activity: Not on file  Other Topics Concern  . Not on file  Social History Narrative   Lives at home with his wife   Right handed    Family History  Problem Relation Age of Onset  . Stroke Mother   . Stroke Father   . Dementia Neg Hx     Past Medical History:  Diagnosis Date  . Allergic rhinitis   . Arthritis   . Atrial fibrillation ([place])   . BPH (benign prostatic hyperplasia)   . Bronchitis   . Chronic fatigue   . CKD (chronic kidney disease), stage III ([place])   . Complete heart block ([place])    pacemaker originally placed in 1988  . Dementia ([place])   . Depression   . DJD (degenerative joint disease)    hands  . Elevated prolactin level (HCC)    related to Reglan  . Erectile dysfunction   . Esophageal reflux   . GERD (gastroesophageal reflux disease)   . Heart disease   . Hx of pulmonary hypertension    Dr. Rayann Heman  . Hyperlipidemia   . Hypertension   . Lung nodule    noted on CXR December 2012  . Normal nuclear stress test 2013  . OSA (obstructive sleep apnea)    uses cpap  . Permanent atrial fibrillation    on coumadin  . Pneumonia   . Prepatellar bursitis 2010   right  . Presence of permanent cardiac pacemaker   . Pulmonary nodule    RLL  . Renal cyst    cysts  . Renal insufficiency    Rt kidney removed d/t maglinant tumor  . Right heart failure (HCC)    Dr. Rayann Heman  . RLS (restless legs syndrome)   . Shingles   . Sick sinus syndrome ([place])   . Stroke [facility])    TIA greater than 20 years  . Transitional cell carcinoma ([place])    s/p nephrectomy in  2010    Patient Active Problem List   Diagnosis Date Noted  .  Closed displaced fracture of left femoral neck ([place]) 12/05/2017  . Status post total replacement of left hip   . Benign essential HTN   . History of CVA (cerebrovascular accident)   . Cardiac pacemaker in situ   . Postoperative pain   . Acute blood loss anemia   . Closed left hip fracture ([place]) 11/29/2017  . Lower leg edema 11/29/2017  . Fall 11/29/2017  . Essential hypertension 11/29/2017  . Stroke (cerebrum) ([place]) 11/29/2017  . BPH (benign prostatic hyperplasia) 11/29/2017  . Dementia ([place]) 11/29/2017  .  CKD (chronic kidney disease), stage III ([place]) 11/29/2017  . Closed right hip fracture, initial encounter ([place]) 11/29/2017  . Diarrhea 11/29/2017  . Osteoarthritis of fifth carpometacarpal joint of left hand 07/18/2017  . Lung nodule 02/21/2016  . SIRS (systemic inflammatory response syndrome) ([place]) 01/02/2016  . Sepsis ([place]) 01/02/2016  . Encounter for therapeutic drug monitoring 09/23/2013  . Premature ventricular contraction 06/03/2013  . Pacemaker-Medtronic 01/04/2012  . Dyspnea 10/28/2011  . Complete heart block ([place]) 09/05/2011  . Long term ([place]) use of anticoagulants 02/04/2011  . BRADYCARDIA-TACHYCARDIA SYNDROME 08/16/2010  . ALLERGIC RHINITIS 12/24/2007  . Obstructive sleep apnea 08/17/2007  . RESTLESS LEG SYNDROME 08/17/2007  . FIBRILLATION, ATRIAL 08/17/2007  . Chronic bronchitis ([place]) 08/17/2007  . Esophageal reflux 08/17/2007  . Insomnia secondary to chronic pain 08/17/2007  . CHEYNE-STOKES RESPIRATION 08/17/2007    Past Surgical History:  Procedure Laterality Date  . CARDIOVASCULAR STRESS TEST  03/23/2009   EF 65%, NORMAL  . CATARACT EXTRACTION W/PHACO Left 08/26/2015   Procedure: CATARACT EXTRACTION PHACO AND INTRAOCULAR LENS PLACEMENT (IOC);  Surgeon: Marylynn Pearson, MD;  Location: [facility];  Service: Ophthalmology;  Laterality: Left;  . DOPPLER ECHOCARDIOGRAPHY  03/26/2001   EF 55%  . EYE SURGERY     tumor removed from Rt eye lid  . HERNIA REPAIR      multiple  . hydrocelectomy Right   . MASS EXCISION Left 11/07/2012   Procedure: MINOR EXCISION OF MASS;  Surgeon: Haywood Lasso, MD;  Location: [facility];  Service: General;  Laterality: Left;  . multiple basal cell carcinomas    . NEPHRECTOMY  2010   rt  . PACEMAKER INSERTION  1988   most recent generator change by Dr Rayann Heman 20067/2/13 with new right ventricular lead placed  . PERMANENT PACEMAKER GENERATOR CHANGE N/A 01/03/2012   Procedure: PERMANENT PACEMAKER GENERATOR CHANGE;  Surgeon: Thompson Grayer, MD;  [facility] CATH LAB;  Service: Cardiovascular;  Laterality: N/A;  . TOTAL HIP ARTHROPLASTY Left 12/04/2017   Procedure: TOTAL HIP ARTHROPLASTY ANTERIOR APPROACH WITH CABLE;  Surgeon: Rod Can, MD;  Location: [facility];  Service: Orthopedics;  Laterality: Left;  . US ECHOCARDIOGRAPHY  04/22/2008   EF 55-60%    [facility] Medications  Medication Sig Dispense Refill  . acetaminophen (TYLENOL) 325 MG tablet Take 650 mg by mouth every 4 (four) hours as needed for mild pain or fever.     Marland Kitchen aspirin EC 81 MG tablet Take 81 mg by mouth daily.    . cycloSPORINE (RESTASIS) 0.05 % ophthalmic emulsion Place 1 drop into both eyes 2 (two) times daily.     Marland Kitchen doxazosin (CARDURA) 2 MG tablet Take 2 mg by mouth at bedtime.     . finasteride (PROSCAR) 5 MG tablet Take 5 mg by mouth daily.    . furosemide (LASIX) 40 MG tablet Take 40 mg by mouth daily.    Marland Kitchen galantamine (RAZADYNE) 8 MG tablet Take 8 mg by mouth 2 (two) times daily.     Marland Kitchen glucosamine-chondroitin 500-400 MG tablet Take 1 tablet by mouth 2 (two) times daily.     . Hypromellose (ISOPTO TEARS OP) Place 1 drop into both eyes 2 (two) times daily.     Marland Kitchen ipratropium (ATROVENT) 0.06 % nasal spray Place 2 sprays into both nostrils 2 (two) times daily as needed for rhinitis.     . metoprolol tartrate (LOPRESSOR) 25 MG tablet Take 25 mg by mouth 2 (two) times daily.    . Multiple  Vitamin (MULTIVITAMIN) capsule Take 1  capsule by mouth daily.      . pantoprazole (PROTONIX) 40 MG tablet Take 40 mg by mouth daily.  5  . simethicone (MYLICON) 80 MG chewable tablet Chew 80 mg by mouth 2 (two) times daily.    Marland Kitchen warfarin (COUMADIN) 5 MG tablet Take 1 tablet (5 mg total) by mouth daily. Take as directed by coumadin clinic (Patient taking differently: Take 2.5-5 mg by mouth See admin instructions. Take 5 mg everyday except Monday take 2.5 mg) 90 tablet 0  . divalproex (DEPAKOTE ER) 250 MG 24 hr tablet Take 1 tablet (250 mg total) by mouth at bedtime. 30 tablet 11   No [place] facility-administered medications for this visit.     Allergies as of 08/06/2018  . (No Known Allergies)    Vitals: BP 110/63 (BP Location: Right Arm, Patient Position: Sitting)   Pulse 71   Ht 5' 9.5" (1.765 m)   Wt 168 lb (76.2 kg)   BMI 24.45 kg/m  Last Weight:  Wt Readings from Last 1 Encounters:  08/06/18 168 lb (76.2 kg)   Last Height:   Ht Readings from Last 1 Encounters:  08/06/18 5' 9.5" (1.765 m)     Physical exam: Exam: Gen: NAD, conversant                   CV: RRR, +SEM. No Carotid Bruits. No peripheral edema, warm, nontender Eyes: Conjunctivae clear without exudates or hemorrhage  Neuro: Detailed Neurologic Exam  Speech:    Speech is normal; fluent and spontaneous with impaired comprehension.  Cognition:     MMSE - Mini Mental State Exam 08/06/2018  Orientation to time 4  Orientation to Place 5  Registration 3  Attention/ Calculation 5  Recall 1  Language- name 2 objects 2  Language- repeat 1  Language- follow 3 step command 3  Language- read & follow direction 1  Write a sentence 1  Copy design 1  Total score 27    Cranial Nerves:    The pupils are pinpoint. Attempted fundoscopy could not visualize due to small pupils.  Visual fields are full to threat. Extraocular movements are intact. Trigeminal sensation is intact and the muscles of mastication are normal. The face is symmetric. The palate  elevates in the midline. Hearing intact to voice. Voice is normal. Shoulder shrug is normal. The tongue has normal motion without fasciculations.   Coordination:    No dysmetria  Gait:    Good arm swing, not ataxic, not shufflinf  Motor Observation:    Repetitive mouth movements Tone:    Normal muscle tone.    Posture:    Posture is normal. normal erect    Strength:    Strength is V/V in the upper and lower limbs.      Sensation: intact to LT     Reflex Exam:  DTR's:    Deep tendon reflexes in the upper and lower extremities are brisk bilaterally.   Toes:    The toes are downgoing bilaterally.   Clonus:    Clonus is absent.    Assessment/Plan:  85 year old with progressive dementia, worsening behavior probkems, agitation, outbursts, becoming more physical. His exam shows +frontal release signs, likely dementia progressively  involving the frontal lobe.  Start Depakote for behavioral changes Continue the galantamine Consider Namenda (Memantine) however will likely provide few clinical results in this stage of dementia Labs today [facility] at [facility] for ongoing therapy and medication  management for behavioral problems F/u 6 months  Orders Placed This Encounter  Procedures  . Homocysteine  . B12 and Folate Panel  . Methylmalonic acid, serum  . Vitamin B1  . Vitamin B6  . Comprehensive metabolic panel   Meds ordered this encounter  Medications  . divalproex (DEPAKOTE ER) 250 MG 24 hr tablet    Sig: Take 1 tablet (250 mg total) by mouth at bedtime.    Dispense:  30 tablet    Refill:  11    Cc: [name], MD, [facility]  Sarina Ill, MD  [facility] [address] [place], [place] [address]  Phone [phone] Fax [phone]

## 2018-08-09 ENCOUNTER — Encounter: Payer: Self-pay | Admitting: *Deleted

## 2018-08-10 LAB — COMPREHENSIVE METABOLIC PANEL
ALT: 15 IU/L (ref 0–44)
AST: 18 IU/L (ref 0–40)
Albumin/Globulin Ratio: 1.8 (ref 1.2–2.2)
Albumin: 4.1 g/dL (ref 3.6–4.6)
Alkaline Phosphatase: 126 IU/L — ABNORMAL HIGH (ref 39–117)
BUN/Creatinine Ratio: 19 (ref 10–24)
BUN: 30 mg/dL — ABNORMAL HIGH (ref 8–27)
Bilirubin Total: 0.3 mg/dL (ref 0.0–1.2)
CO2: 24 mmol/L (ref 20–29)
Calcium: 9.7 mg/dL (ref 8.6–10.2)
Chloride: 106 mmol/L (ref 96–106)
Creatinine, Ser: 1.61 mg/dL — ABNORMAL HIGH (ref 0.76–1.27)
GFR calc Af Amer: 44 mL/min/{1.73_m2} — ABNORMAL LOW (ref >59)
GFR calc non Af Amer: 38 mL/min/{1.73_m2} — ABNORMAL LOW (ref >59)
Globulin, Total: 2.3 g/dL (ref 1.5–4.5)
Glucose: 61 mg/dL — ABNORMAL LOW (ref 65–99)
Potassium: 5 mmol/L (ref 3.5–5.2)
Sodium: 143 mmol/L (ref 134–144)
Total Protein: 6.4 g/dL (ref 6.0–8.5)

## 2018-08-10 LAB — B12 AND FOLATE PANEL
Folate: >20 ng/mL (ref >3.0)
Vitamin B-12: 764 pg/mL (ref 232–1245)

## 2018-08-10 LAB — VITAMIN B6: VITAMIN B6: 12.6 ug/L (ref 5.3–46.7)

## 2018-08-10 LAB — METHYLMALONIC ACID, SERUM: Methylmalonic Acid: 251 nmol/L (ref 0–378)

## 2018-08-10 LAB — VITAMIN B1: Thiamine: 141.2 nmol/L (ref 66.5–200.0)

## 2018-08-10 LAB — HOMOCYSTEINE: Homocysteine: 17.6 umol/L (ref 0.0–21.3)

## 2018-08-14 DIAGNOSIS — G4733 Obstructive sleep apnea (adult) (pediatric): Secondary | ICD-10-CM | POA: Diagnosis not present

## 2018-08-21 DIAGNOSIS — M152 Bouchard's nodes (with arthropathy): Secondary | ICD-10-CM | POA: Diagnosis not present

## 2018-08-21 DIAGNOSIS — G301 Alzheimer's disease with late onset: Secondary | ICD-10-CM | POA: Diagnosis not present

## 2018-08-21 DIAGNOSIS — I4891 Unspecified atrial fibrillation: Secondary | ICD-10-CM | POA: Diagnosis not present

## 2018-08-21 DIAGNOSIS — N183 Chronic kidney disease, stage 3 (moderate): Secondary | ICD-10-CM | POA: Diagnosis not present

## 2018-08-21 DIAGNOSIS — N400 Enlarged prostate without lower urinary tract symptoms: Secondary | ICD-10-CM | POA: Diagnosis not present

## 2018-08-21 DIAGNOSIS — J40 Bronchitis, not specified as acute or chronic: Secondary | ICD-10-CM | POA: Diagnosis not present

## 2018-08-21 DIAGNOSIS — F0390 Unspecified dementia without behavioral disturbance: Secondary | ICD-10-CM | POA: Diagnosis not present

## 2018-08-21 DIAGNOSIS — M47816 Spondylosis without myelopathy or radiculopathy, lumbar region: Secondary | ICD-10-CM | POA: Diagnosis not present

## 2018-08-24 ENCOUNTER — Ambulatory Visit (INDEPENDENT_AMBULATORY_CARE_PROVIDER_SITE_OTHER): Payer: PPO | Admitting: *Deleted

## 2018-08-24 DIAGNOSIS — I4891 Unspecified atrial fibrillation: Secondary | ICD-10-CM

## 2018-08-24 DIAGNOSIS — Z5181 Encounter for therapeutic drug level monitoring: Secondary | ICD-10-CM

## 2018-08-24 LAB — POCT INR: INR: 4.4 — AB (ref 2.0–3.0)

## 2018-08-24 NOTE — Patient Instructions (Addendum)
Description   Do not take any Coumadin tonight and do not take any Coumadin tomorrow then start taking 5mg  (1 tablet) every day except 1/2 tablet (2.5mg ) on Mondays and Fridays.  Recheck INR in 2 weeks (usually 6 weeks).  Call with any new medications or if scheduled for any procedures  [phone]

## 2018-09-07 ENCOUNTER — Ambulatory Visit (INDEPENDENT_AMBULATORY_CARE_PROVIDER_SITE_OTHER): Payer: PPO | Admitting: Pharmacist

## 2018-09-07 DIAGNOSIS — I4891 Unspecified atrial fibrillation: Secondary | ICD-10-CM

## 2018-09-07 DIAGNOSIS — Z5181 Encounter for therapeutic drug level monitoring: Secondary | ICD-10-CM | POA: Diagnosis not present

## 2018-09-07 DIAGNOSIS — N183 Chronic kidney disease, stage 3 (moderate): Secondary | ICD-10-CM | POA: Diagnosis not present

## 2018-09-07 DIAGNOSIS — R05 Cough: Secondary | ICD-10-CM | POA: Diagnosis not present

## 2018-09-07 DIAGNOSIS — I272 Pulmonary hypertension, unspecified: Secondary | ICD-10-CM | POA: Diagnosis not present

## 2018-09-07 LAB — POCT INR: INR: 1.7 — AB (ref 2.0–3.0)

## 2018-09-07 NOTE — Patient Instructions (Signed)
Description   Change dose back to 5mg  (1 tablet) every day except 1/2 tablet (2.5mg ) on Mondays.  Recheck INR in 3 weeks.  Call with any new medications or if scheduled for any procedures  [phone]

## 2018-09-09 DIAGNOSIS — G4733 Obstructive sleep apnea (adult) (pediatric): Secondary | ICD-10-CM | POA: Diagnosis not present

## 2018-09-10 ENCOUNTER — Other Ambulatory Visit: Payer: Self-pay | Admitting: Neurology

## 2018-09-10 DIAGNOSIS — R609 Edema, unspecified: Secondary | ICD-10-CM | POA: Diagnosis not present

## 2018-09-10 DIAGNOSIS — R05 Cough: Secondary | ICD-10-CM | POA: Diagnosis not present

## 2018-09-10 MED ORDER — DIVALPROEX SODIUM ER 500 MG PO TB24: 500.0000 mg | ORAL_TABLET | Freq: Every day | ORAL | 11 refills | Status: DC

## 2018-09-26 ENCOUNTER — Telehealth: Payer: Self-pay

## 2018-09-26 NOTE — Telephone Encounter (Signed)

## 2018-09-27 DIAGNOSIS — G301 Alzheimer's disease with late onset: Secondary | ICD-10-CM | POA: Diagnosis not present

## 2018-09-27 DIAGNOSIS — I4891 Unspecified atrial fibrillation: Secondary | ICD-10-CM | POA: Diagnosis not present

## 2018-09-27 DIAGNOSIS — M152 Bouchard's nodes (with arthropathy): Secondary | ICD-10-CM | POA: Diagnosis not present

## 2018-09-27 DIAGNOSIS — M47816 Spondylosis without myelopathy or radiculopathy, lumbar region: Secondary | ICD-10-CM | POA: Diagnosis not present

## 2018-09-27 DIAGNOSIS — N183 Chronic kidney disease, stage 3 (moderate): Secondary | ICD-10-CM | POA: Diagnosis not present

## 2018-09-27 DIAGNOSIS — J40 Bronchitis, not specified as acute or chronic: Secondary | ICD-10-CM | POA: Diagnosis not present

## 2018-09-27 DIAGNOSIS — N400 Enlarged prostate without lower urinary tract symptoms: Secondary | ICD-10-CM | POA: Diagnosis not present

## 2018-09-28 ENCOUNTER — Ambulatory Visit (INDEPENDENT_AMBULATORY_CARE_PROVIDER_SITE_OTHER): Payer: PPO | Admitting: Pharmacist

## 2018-09-28 DIAGNOSIS — I4821 Permanent atrial fibrillation: Secondary | ICD-10-CM

## 2018-09-28 DIAGNOSIS — I4891 Unspecified atrial fibrillation: Secondary | ICD-10-CM | POA: Diagnosis not present

## 2018-09-28 DIAGNOSIS — Z5181 Encounter for therapeutic drug level monitoring: Secondary | ICD-10-CM

## 2018-09-28 LAB — POCT INR: INR: 4.4 — AB (ref 2.0–3.0)

## 2018-09-28 MED ORDER — APIXABAN 5 MG PO TABS: 5.0000 mg | ORAL_TABLET | Freq: Two times a day (BID) | ORAL | 1 refills | Status: DC

## 2018-10-02 DIAGNOSIS — I4891 Unspecified atrial fibrillation: Secondary | ICD-10-CM | POA: Diagnosis not present

## 2018-10-02 DIAGNOSIS — Z0000 Encounter for general adult medical examination without abnormal findings: Secondary | ICD-10-CM | POA: Diagnosis not present

## 2018-10-02 DIAGNOSIS — N400 Enlarged prostate without lower urinary tract symptoms: Secondary | ICD-10-CM | POA: Diagnosis not present

## 2018-10-02 DIAGNOSIS — R7301 Impaired fasting glucose: Secondary | ICD-10-CM | POA: Diagnosis not present

## 2018-10-02 DIAGNOSIS — N183 Chronic kidney disease, stage 3 (moderate): Secondary | ICD-10-CM | POA: Diagnosis not present

## 2018-10-02 DIAGNOSIS — Z1389 Encounter for screening for other disorder: Secondary | ICD-10-CM | POA: Diagnosis not present

## 2018-10-02 DIAGNOSIS — K219 Gastro-esophageal reflux disease without esophagitis: Secondary | ICD-10-CM | POA: Diagnosis not present

## 2018-10-02 DIAGNOSIS — G301 Alzheimer's disease with late onset: Secondary | ICD-10-CM | POA: Diagnosis not present

## 2018-10-02 DIAGNOSIS — I272 Pulmonary hypertension, unspecified: Secondary | ICD-10-CM | POA: Diagnosis not present

## 2018-10-09 DIAGNOSIS — R413 Other amnesia: Secondary | ICD-10-CM | POA: Diagnosis not present

## 2018-10-09 DIAGNOSIS — M25552 Pain in left hip: Secondary | ICD-10-CM | POA: Diagnosis not present

## 2018-10-09 DIAGNOSIS — J4520 Mild intermittent asthma, uncomplicated: Secondary | ICD-10-CM | POA: Diagnosis not present

## 2018-10-09 DIAGNOSIS — I10 Essential (primary) hypertension: Secondary | ICD-10-CM | POA: Diagnosis not present

## 2018-10-09 DIAGNOSIS — N183 Chronic kidney disease, stage 3 (moderate): Secondary | ICD-10-CM | POA: Diagnosis not present

## 2018-10-09 DIAGNOSIS — L97222 Non-pressure chronic ulcer of left calf with fat layer exposed: Secondary | ICD-10-CM | POA: Diagnosis not present

## 2018-10-09 DIAGNOSIS — L97822 Non-pressure chronic ulcer of other part of left lower leg with fat layer exposed: Secondary | ICD-10-CM | POA: Diagnosis not present

## 2018-10-09 DIAGNOSIS — R7301 Impaired fasting glucose: Secondary | ICD-10-CM | POA: Diagnosis not present

## 2018-10-09 DIAGNOSIS — E538 Deficiency of other specified B group vitamins: Secondary | ICD-10-CM | POA: Diagnosis not present

## 2018-10-09 DIAGNOSIS — L97812 Non-pressure chronic ulcer of other part of right lower leg with fat layer exposed: Secondary | ICD-10-CM | POA: Diagnosis not present

## 2018-10-09 DIAGNOSIS — E559 Vitamin D deficiency, unspecified: Secondary | ICD-10-CM | POA: Diagnosis not present

## 2018-10-09 DIAGNOSIS — G3184 Mild cognitive impairment, so stated: Secondary | ICD-10-CM | POA: Diagnosis not present

## 2018-10-10 DIAGNOSIS — E782 Mixed hyperlipidemia: Secondary | ICD-10-CM | POA: Diagnosis not present

## 2018-10-10 DIAGNOSIS — I10 Essential (primary) hypertension: Secondary | ICD-10-CM | POA: Diagnosis not present

## 2018-10-10 DIAGNOSIS — E039 Hypothyroidism, unspecified: Secondary | ICD-10-CM | POA: Diagnosis not present

## 2018-10-10 DIAGNOSIS — G4733 Obstructive sleep apnea (adult) (pediatric): Secondary | ICD-10-CM | POA: Diagnosis not present

## 2018-10-10 DIAGNOSIS — E119 Type 2 diabetes mellitus without complications: Secondary | ICD-10-CM | POA: Diagnosis not present

## 2018-10-11 ENCOUNTER — Telehealth: Payer: Self-pay

## 2018-10-11 ENCOUNTER — Telehealth: Payer: Self-pay | Admitting: *Deleted

## 2018-10-11 NOTE — Telephone Encounter (Signed)

## 2018-10-11 NOTE — Telephone Encounter (Signed)
lmom for prescreen drive thry

## 2018-10-12 ENCOUNTER — Ambulatory Visit (INDEPENDENT_AMBULATORY_CARE_PROVIDER_SITE_OTHER): Payer: PPO | Admitting: Pharmacist

## 2018-10-12 ENCOUNTER — Other Ambulatory Visit: Payer: Self-pay

## 2018-10-12 DIAGNOSIS — J449 Chronic obstructive pulmonary disease, unspecified: Secondary | ICD-10-CM | POA: Diagnosis not present

## 2018-10-12 DIAGNOSIS — I4891 Unspecified atrial fibrillation: Secondary | ICD-10-CM

## 2018-10-12 DIAGNOSIS — Z5181 Encounter for therapeutic drug level monitoring: Secondary | ICD-10-CM | POA: Diagnosis not present

## 2018-10-12 LAB — POCT INR: INR: 4.3 — AB (ref 2.0–3.0)

## 2018-10-12 MED ORDER — APIXABAN 2.5 MG PO TABS: 2.5000 mg | ORAL_TABLET | Freq: Two times a day (BID) | ORAL | 5 refills | Status: DC

## 2018-10-29 DIAGNOSIS — J40 Bronchitis, not specified as acute or chronic: Secondary | ICD-10-CM | POA: Diagnosis not present

## 2018-10-29 DIAGNOSIS — M152 Bouchard's nodes (with arthropathy): Secondary | ICD-10-CM | POA: Diagnosis not present

## 2018-10-29 DIAGNOSIS — M47816 Spondylosis without myelopathy or radiculopathy, lumbar region: Secondary | ICD-10-CM | POA: Diagnosis not present

## 2018-10-29 DIAGNOSIS — G301 Alzheimer's disease with late onset: Secondary | ICD-10-CM | POA: Diagnosis not present

## 2018-10-29 DIAGNOSIS — N400 Enlarged prostate without lower urinary tract symptoms: Secondary | ICD-10-CM | POA: Diagnosis not present

## 2018-10-29 DIAGNOSIS — I4891 Unspecified atrial fibrillation: Secondary | ICD-10-CM | POA: Diagnosis not present

## 2018-10-29 DIAGNOSIS — N183 Chronic kidney disease, stage 3 (moderate): Secondary | ICD-10-CM | POA: Diagnosis not present

## 2018-11-09 DIAGNOSIS — G4733 Obstructive sleep apnea (adult) (pediatric): Secondary | ICD-10-CM | POA: Diagnosis not present

## 2018-11-14 NOTE — Telephone Encounter (Signed)
I called the patient's wife Lysbeth Galas (on [place]) and offered a telephone visit to discuss her medication concerns. She understands the pt will not be able to physically be seen but discussion will take place and changes can be made to medications, etc. She gave consent for phone visit to be filed with insurance. Offered appts as early as Architectural technologist. Scheduled for 12/05/2018 @ 1:00 pm.

## 2018-11-15 ENCOUNTER — Encounter: Payer: Self-pay | Admitting: *Deleted

## 2018-11-15 ENCOUNTER — Telehealth: Payer: Self-pay | Admitting: *Deleted

## 2018-11-15 NOTE — Telephone Encounter (Signed)
Spoke with pt's wife [name] (on [place]) and offered  To r/s the June 3rd telephone appt to Monday 5/18 at 2:00 pm. She verbalized appreciation. She confirmed pt's name & dob and provided chart update. No changes to history, no new medications. Depakote 500 mg is twice daily per wife. She understands the office will call to check-in at 1:30 on Monday and Dr. Jaynee Eagles will call them at 2:00 pm. She verbalized appreciation.   6/3 1 pm appt canceled.

## 2018-11-15 NOTE — Telephone Encounter (Signed)
See phone note. We had an opening Monday 5/18 and I moved the telephone appt up.

## 2018-11-19 ENCOUNTER — Ambulatory Visit (INDEPENDENT_AMBULATORY_CARE_PROVIDER_SITE_OTHER): Payer: PPO | Admitting: Neurology

## 2018-11-19 ENCOUNTER — Other Ambulatory Visit: Payer: Self-pay

## 2018-11-19 DIAGNOSIS — F060 Psychotic disorder with hallucinations due to known physiological condition: Secondary | ICD-10-CM | POA: Diagnosis not present

## 2018-11-19 DIAGNOSIS — F0391 Unspecified dementia with behavioral disturbance: Secondary | ICD-10-CM | POA: Diagnosis not present

## 2018-11-19 DIAGNOSIS — R451 Restlessness and agitation: Secondary | ICD-10-CM | POA: Diagnosis not present

## 2018-11-19 DIAGNOSIS — F062 Psychotic disorder with delusions due to known physiological condition: Secondary | ICD-10-CM

## 2018-11-19 MED ORDER — QUETIAPINE FUMARATE 25 MG PO TABS: ORAL_TABLET | ORAL | 6 refills | Status: DC

## 2018-11-19 NOTE — Progress Notes (Signed)
[name]    Provider:  Dr Jaynee Eagles Referring Provider: [name], MD Primary Care Provider:  [name], MD  CC: Dementia with behavioral disturbance  Virtual Visit via Telephone Note  I connected with [name] on 11/20/18 at  2:00 PM EDT by telephone and verified that I am speaking with the correct person using two identifiers.  Location: Patient: home with wife Provider: office   I discussed the limitations, risks, security and privacy concerns of performing an evaluation and management service by telephone and the availability of in person appointments. I also discussed with the patient that there may be a patient responsible charge related to this service. The patient expressed understanding and agreed to proceed.   Discussed patient worsening behavior, dementia progression, caregiver burnout, a memory unit, risks of medication especially anti-osychotics which are not fda approved in the elderly and carry black box warning for morbidity and mortality. Wife understands, says she can;t ;ive this way, tried to comfort her. Will continue to increase Seroquel as tolerated up to 200mg  twice daily. Can try SSRI next (Tried Sertraline low dose in the past, try something different). After this can try to increase Depakote if needed to 1g BID. Patient's wife to email me and we can further titrate or add. Discussed risk of all these medications including significant morbidity and mortality. At this time his behavior is impacting safety in the home for patient and wife so feel side effects are warranted.  Follow Up Instructions:    I discussed the assessment and treatment plan with the patient. The patient was provided an opportunity to ask questions and all were answered. The patient agreed with the plan and demonstrated an understanding of the instructions.   The patient was advised to call back or seek an in-person evaluation if the symptoms worsen or if the  condition fails to improve as anticipated.  I provided 30 minutes of non-face-to-face time during this encounter.   Melvenia Beam, MD    HPI:  [name] is a 85 y.o. male here as requested by provider [name], MD for dementia evaluation.  Past medical history of erectile dysfunction, impaired fasting glucose, pulmonary hypertension, chronic kidney disease stage III, long-term use of anticoagulant, high cholesterol, chronic fatigue, mild dementia on galantamine with some behavioral issues, low-dose sertraline is not helped.  He also has A. fib, obstructive sleep apnea on CPAP doing well, pacemaker. Patient here with his wife. Symptoms started in 2013 slowly progressive sine then. He was diagnosed with Alzheimers disease. He was seeing someone in [facility]. They saw Dr. Brett Fairy in 2013. He saw Dr. Vikki Ports at [facility] as well as Dr. Verdene Rio. He is having anger and agitation. Not helping. His personality is changing. Wife provides much information. He has changed in recent years. He gets very mad at his wife. Yesterday morning he was getting ready for church, wife doesn;t go anymore, he gets vey upset, screaming and yelling. He is getting closer to more physically, nudging her. Happening every day. He gets very agitated. His parents never really aged they had strokes earlier in life.   Reviewed notes, labs and imaging from outside physicians, which showed:  Reviewed notes from Dr. Laurann Montana, patient has been diagnosed with dementia on galantamine with some behavioral issues, low-dose sertraline has not helped.  No side effects.  It appears his sertraline was increased to 50 mg at last appointment.  He has a history of mild dementia.  He has some  increased symptoms recently.  Short-term memory is very poor.  Cannot remember the names of new people he meets some of his older friends in his exercise group having trouble.  He is driving in circles on several occasions.  He still very active  going to exercise and swim class.  He has had some issues with giving out information inappropriately on the phone.  Had to cancel credit card several times.  He no longer has credit cards and computer and landline has been disconnected.  His wife thinks is been a change in his personality more irritable and sometimes saying inappropriate things.  Clonazepam was stopped.  Reviewed CT of the head : 1. Atrophy and chronic microvascular ischemia. No acute intracranial abnormality. 2. Multiple facial fractures including nasal bone , nasal septum, left maxillary sinus, and left inferior orbital rim 3. Cervical spondylosis.  Negative for cervical fracture.  Review of Systems: Patient complains of symptoms per HPI as well as the following symptoms: Easy bruising, joint pain, joint swelling, aching muscles, memory loss, confusion, difficulty swallowing, restless legs, decreased energy. Pertinent negatives and positives per HPI. All others negative.   Social History   Socioeconomic History  . Marital status: Married    Spouse name: Not on file  . Number of children: 3  . Years of education: 16  . Highest education level: Bachelor's degree (e.g., BA, AB, BS)  Occupational History  . Occupation: Retired Technical sales engineer: RETIRED  Social Needs  . Financial resource strain: Not on file  . Food insecurity:    Worry: Not on file    Inability: Not on file  . Transportation needs:    Medical: Not on file    Non-medical: Not on file  Tobacco Use  . Smoking status: Never Smoker  . Smokeless tobacco: Never Used  Substance and Sexual Activity  . Alcohol use: No  . Drug use: No  . Sexual activity: Not on file  Lifestyle  . Physical activity:    Days per week: Not on file    Minutes per session: Not on file  . Stress: Not on file  Relationships  . Social connections:    Talks on phone: Not on file    Gets together: Not on file    Attends religious service: Not on file    Active  member of club or organization: Not on file    Attends meetings of clubs or organizations: Not on file    Relationship status: Not on file  . Intimate partner violence:    Fear of [place] or ex partner: Not on file    Emotionally abused: Not on file    Physically abused: Not on file    Forced sexual activity: Not on file  Other Topics Concern  . Not on file  Social History Narrative   Lives at home with his wife   Right handed    Family History  Problem Relation Age of Onset  . Stroke Mother   . Stroke Father   . Dementia Neg Hx     Past Medical History:  Diagnosis Date  . Allergic rhinitis   . Arthritis   . Atrial fibrillation ([place])   . BPH (benign prostatic hyperplasia)   . Bronchitis   . Chronic fatigue   . CKD (chronic kidney disease), stage III ([place])   . Complete heart block ([place])    pacemaker originally placed in 1988  . Dementia ([place])   . Depression   .  DJD (degenerative joint disease)    hands  . Elevated prolactin level (HCC)    related to Reglan  . Erectile dysfunction   . Esophageal reflux   . GERD (gastroesophageal reflux disease)   . Heart disease   . Hx of pulmonary hypertension    Dr. Rayann Heman  . Hyperlipidemia   . Hypertension   . Lung nodule    noted on CXR December 2012  . Normal nuclear stress test 2013  . OSA (obstructive sleep apnea)    uses cpap  . Permanent atrial fibrillation    on coumadin  . Pneumonia   . Prepatellar bursitis 2010   right  . Presence of permanent cardiac pacemaker   . Pulmonary nodule    RLL  . Renal cyst    cysts  . Renal insufficiency    Rt kidney removed d/t maglinant tumor  . Right heart failure (HCC)    Dr. Rayann Heman  . RLS (restless legs syndrome)   . Shingles   . Sick sinus syndrome ([place])   . Stroke [facility])    TIA greater than 20 years  . Transitional cell carcinoma ([place])    s/p nephrectomy in  2010    Patient Active Problem List   Diagnosis Date Noted  . Closed displaced fracture of left femoral  neck ([place]) 12/05/2017  . Status post total replacement of left hip   . Benign essential HTN   . History of CVA (cerebrovascular accident)   . Cardiac pacemaker in situ   . Postoperative pain   . Acute blood loss anemia   . Closed left hip fracture ([place]) 11/29/2017  . Lower leg edema 11/29/2017  . Fall 11/29/2017  . Essential hypertension 11/29/2017  . Stroke (cerebrum) ([employer]) 11/29/2017  . BPH (benign prostatic hyperplasia) 11/29/2017  . Dementia ([place]) 11/29/2017  . CKD (chronic kidney disease), stage III ([place]) 11/29/2017  . Closed right hip fracture, initial encounter ([place]) 11/29/2017  . Diarrhea 11/29/2017  . Osteoarthritis of fifth carpometacarpal joint of left hand 07/18/2017  . Lung nodule 02/21/2016  . SIRS (systemic inflammatory response syndrome) ([place]) 01/02/2016  . Sepsis ([place]) 01/02/2016  . Encounter for therapeutic drug monitoring 09/23/2013  . Premature ventricular contraction 06/03/2013  . Pacemaker-Medtronic 01/04/2012  . Dyspnea 10/28/2011  . Complete heart block ([place]) 09/05/2011  . Long term ([place]) use of anticoagulants 02/04/2011  . BRADYCARDIA-TACHYCARDIA SYNDROME 08/16/2010  . ALLERGIC RHINITIS 12/24/2007  . Obstructive sleep apnea 08/17/2007  . RESTLESS LEG SYNDROME 08/17/2007  . FIBRILLATION, ATRIAL 08/17/2007  . Chronic bronchitis ([place]) 08/17/2007  . Esophageal reflux 08/17/2007  . Insomnia secondary to chronic pain 08/17/2007  . CHEYNE-STOKES RESPIRATION 08/17/2007    Past Surgical History:  Procedure Laterality Date  . CARDIOVASCULAR STRESS TEST  03/23/2009   EF 65%, NORMAL  . CATARACT EXTRACTION W/PHACO Left 08/26/2015   Procedure: CATARACT EXTRACTION PHACO AND INTRAOCULAR LENS PLACEMENT (IOC);  Surgeon: Marylynn Pearson, MD;  Location: [place];  Service: Ophthalmology;  Laterality: Left;  . DOPPLER ECHOCARDIOGRAPHY  03/26/2001   EF 55%  . EYE SURGERY     tumor removed from Rt eye lid  . HERNIA REPAIR     multiple  . hydrocelectomy Right   . MASS  EXCISION Left 11/07/2012   Procedure: MINOR EXCISION OF MASS;  Surgeon: Haywood Lasso, MD;  Location: [place];  Service: General;  Laterality: Left;  . multiple basal cell carcinomas    . NEPHRECTOMY  2010   rt  . PACEMAKER  INSERTION  1988   most recent generator change by Dr Rayann Heman 20067/2/13 with new right ventricular lead placed  . PERMANENT PACEMAKER GENERATOR CHANGE N/A 01/03/2012   Procedure: PERMANENT PACEMAKER GENERATOR CHANGE;  Surgeon: Thompson Grayer, MD;  Location: [facility] CATH LAB;  Service: Cardiovascular;  Laterality: N/A;  . TOTAL HIP ARTHROPLASTY Left 12/04/2017   Procedure: TOTAL HIP ARTHROPLASTY ANTERIOR APPROACH WITH CABLE;  Surgeon: Rod Can, MD;  Location: [place];  Service: Orthopedics;  Laterality: Left;  . US ECHOCARDIOGRAPHY  04/22/2008   EF 55-60%    [facility] Medications  Medication Sig Dispense Refill  . acetaminophen (TYLENOL) 325 MG tablet Take 650 mg by mouth every 4 (four) hours as needed for mild pain or fever.     Marland Kitchen apixaban (ELIQUIS) 2.5 MG TABS tablet Take 1 tablet (2.5 mg total) by mouth 2 (two) times daily. 60 tablet 5  . aspirin EC 81 MG tablet Take 81 mg by mouth daily.    . cycloSPORINE (RESTASIS) 0.05 % ophthalmic emulsion Place 1 drop into both eyes 2 (two) times daily.     . divalproex (DEPAKOTE ER) 500 MG 24 hr tablet Take 1 tablet (500 mg total) by mouth at bedtime. (Patient taking differently: Take 500 mg by mouth 2 (two) times a day. ) 30 tablet 11  . doxazosin (CARDURA) 2 MG tablet Take 2 mg by mouth at bedtime.     . finasteride (PROSCAR) 5 MG tablet Take 5 mg by mouth daily.    . furosemide (LASIX) 40 MG tablet Take 40 mg by mouth daily.    Marland Kitchen galantamine (RAZADYNE) 8 MG tablet Take 8 mg by mouth 2 (two) times daily.     Marland Kitchen glucosamine-chondroitin 500-400 MG tablet Take 1 tablet by mouth 2 (two) times daily.     . Hypromellose (ISOPTO TEARS OP) Place 1 drop into both eyes 2 (two) times daily.     Marland Kitchen ipratropium  (ATROVENT) 0.06 % nasal spray Place 2 sprays into both nostrils 2 (two) times daily as needed for rhinitis.     . metoprolol tartrate (LOPRESSOR) 25 MG tablet Take 25 mg by mouth 2 (two) times daily.    . Multiple Vitamin (MULTIVITAMIN) capsule Take 1 capsule by mouth daily.      . pantoprazole (PROTONIX) 40 MG tablet Take 40 mg by mouth daily.  5  . QUEtiapine (SEROQUEL) 25 MG tablet Week 1: 25mg  (1 pill twice daily). Week 2: 50mg  (2 pills twice daily). Week 3: 75mg  (3 pills twice daily). Week 4: 100mg  (4 pills twice daily). 240 tablet 6   No [place] facility-administered medications for this visit.     Allergies as of 11/19/2018  . (No Known Allergies)    Vitals: There were no vitals taken for this visit. Last Weight:  Wt Readings from Last 1 Encounters:  11/15/18 174 lb (78.9 kg)   Last Height:   Ht Readings from Last 1 Encounters:  11/15/18 5\' 10"  (1.778 m)     Physical exam: Exam: Gen: NAD, conversant                   CV: RRR, +SEM. No Carotid Bruits. No peripheral edema, warm, nontender Eyes: Conjunctivae clear without exudates or hemorrhage  Neuro: Detailed Neurologic Exam  Speech:    Speech is normal; fluent and spontaneous with impaired comprehension.  Cognition:     MMSE - Mini Mental State Exam 08/06/2018  Orientation to time 4  Orientation to Place 5  Registration 3  Attention/ Calculation 5  Recall 1  Language- name 2 objects 2  Language- repeat 1  Language- follow 3 step command 3  Language- read & follow direction 1  Write a sentence 1  Copy design 1  Total score 27    Cranial Nerves:    The pupils are pinpoint. Attempted fundoscopy could not visualize due to small pupils.  Visual fields are full to threat. Extraocular movements are intact. Trigeminal sensation is intact and the muscles of mastication are normal. The face is symmetric. The palate elevates in the midline. Hearing intact to voice. Voice is normal. Shoulder shrug is normal. The tongue  has normal motion without fasciculations.   Coordination:    No dysmetria  Gait:    Good arm swing, not ataxic, not shuffling  Motor Observation:    Repetitive mouth movements Tone:    Normal muscle tone.    Posture:    Posture is normal. normal erect    Strength:    Strength is V/V in the upper and lower limbs.      Sensation: intact to LT     Reflex Exam:  DTR's:    Deep tendon reflexes in the upper and lower extremities are brisk bilaterally.   Toes:    The toes are downgoing bilaterally.   Clonus:    Clonus is absent.    Assessment/Plan:  85 year old with progressive dementia, worsening behavior probkems, agitation, outbursts, becoming more physical. His exam shows +frontal release signs, likely dementia progressively  involving the frontal lobe.  Will continue to increase Seroquel as tolerated up to 200mg  twice daily. Can try SSRI next (Tried Sertraline low dose in the past, try something different). After this can try to increase Depakote if needed to 1g BID. Patient's wife to email me and we can further titrate or add. Discussed risk of all these medications including significant morbidity and mortality. At this time his behavior is impacting safety in the home for patient and wife so feel side effects are warranted.  Start seroquel for aggressive and dangerous behavior, delusions, hallucinations Consider starting an ssri and increasing dose Keep depakote for now, may increase at a later time Cant afford a memory unit   Continue the galantamine Consider Namenda (Memantine) however will likely provide few clinical results in this stage of dementia Eino Farber at [facility] for ongoing therapy and medication management for behavioral problems F/u 6 months    Meds ordered this encounter  Medications  . QUEtiapine (SEROQUEL) 25 MG tablet    Sig: Week 1: 25mg  (1 pill twice daily). Week 2: 50mg  (2 pills twice daily). Week 3: 75mg  (3 pills twice daily). Week 4:  100mg  (4 pills twice daily).    Dispense:  240 tablet    Refill:  6    Cc: [name], MD  Sarina Ill, MD  [name] [address] [place], [place] [address]  Phone [phone] Fax [phone]

## 2018-11-28 DIAGNOSIS — M152 Bouchard's nodes (with arthropathy): Secondary | ICD-10-CM | POA: Diagnosis not present

## 2018-11-28 DIAGNOSIS — J40 Bronchitis, not specified as acute or chronic: Secondary | ICD-10-CM | POA: Diagnosis not present

## 2018-11-28 DIAGNOSIS — I4891 Unspecified atrial fibrillation: Secondary | ICD-10-CM | POA: Diagnosis not present

## 2018-11-28 DIAGNOSIS — N183 Chronic kidney disease, stage 3 (moderate): Secondary | ICD-10-CM | POA: Diagnosis not present

## 2018-11-28 DIAGNOSIS — M47816 Spondylosis without myelopathy or radiculopathy, lumbar region: Secondary | ICD-10-CM | POA: Diagnosis not present

## 2018-11-28 DIAGNOSIS — G301 Alzheimer's disease with late onset: Secondary | ICD-10-CM | POA: Diagnosis not present

## 2018-11-28 DIAGNOSIS — N400 Enlarged prostate without lower urinary tract symptoms: Secondary | ICD-10-CM | POA: Diagnosis not present

## 2018-12-05 ENCOUNTER — Ambulatory Visit: Payer: Self-pay | Admitting: Neurology

## 2018-12-10 DIAGNOSIS — G4733 Obstructive sleep apnea (adult) (pediatric): Secondary | ICD-10-CM | POA: Diagnosis not present

## 2018-12-18 ENCOUNTER — Other Ambulatory Visit: Payer: Self-pay | Admitting: Neurology

## 2018-12-18 DIAGNOSIS — M7062 Trochanteric bursitis, left hip: Secondary | ICD-10-CM | POA: Diagnosis not present

## 2018-12-18 DIAGNOSIS — Z96642 Presence of left artificial hip joint: Secondary | ICD-10-CM | POA: Diagnosis not present

## 2018-12-18 MED ORDER — QUETIAPINE FUMARATE 100 MG PO TABS: ORAL_TABLET | ORAL | 6 refills | Status: DC

## 2018-12-27 ENCOUNTER — Other Ambulatory Visit: Payer: Self-pay | Admitting: *Deleted

## 2018-12-27 ENCOUNTER — Telehealth: Payer: Self-pay | Admitting: *Deleted

## 2018-12-27 DIAGNOSIS — G301 Alzheimer's disease with late onset: Secondary | ICD-10-CM | POA: Diagnosis not present

## 2018-12-27 DIAGNOSIS — N183 Chronic kidney disease, stage 3 (moderate): Secondary | ICD-10-CM | POA: Diagnosis not present

## 2018-12-27 DIAGNOSIS — M152 Bouchard's nodes (with arthropathy): Secondary | ICD-10-CM | POA: Diagnosis not present

## 2018-12-27 DIAGNOSIS — M47816 Spondylosis without myelopathy or radiculopathy, lumbar region: Secondary | ICD-10-CM | POA: Diagnosis not present

## 2018-12-27 DIAGNOSIS — N400 Enlarged prostate without lower urinary tract symptoms: Secondary | ICD-10-CM | POA: Diagnosis not present

## 2018-12-27 DIAGNOSIS — J40 Bronchitis, not specified as acute or chronic: Secondary | ICD-10-CM | POA: Diagnosis not present

## 2018-12-27 DIAGNOSIS — I4891 Unspecified atrial fibrillation: Secondary | ICD-10-CM | POA: Diagnosis not present

## 2018-12-27 MED ORDER — DIVALPROEX SODIUM 500 MG PO DR TAB: 500.0000 mg | DELAYED_RELEASE_TABLET | Freq: Two times a day (BID) | ORAL | 11 refills | Status: AC

## 2018-12-27 NOTE — Telephone Encounter (Signed)
[place] from [facility] PCP called to clarify pt's Depakote dose, reported as 500 mg BID. Prescription bottle from previous order says daily. I advised per Dr. Jaynee Eagles, pt is now taking 500 mg twice daily and prescription will be sent to reflect that and will order what the patient is taking. I stated I would clarify with pt's wife if if she has the ER or IR.   I called the pt's wife and she stated the bottle says the Depakote is DR and pt is taking 500 mg twice daily. They are now using [facility].  I called [facility] and clarified they had been filling the DR. He stated the ER is different. I e-scribed a verbal order from Dr. Jaynee Eagles for Divalproex (depakote) DR 500 mg BID to [facility] #60 tablets.

## 2019-01-09 DIAGNOSIS — G4733 Obstructive sleep apnea (adult) (pediatric): Secondary | ICD-10-CM | POA: Diagnosis not present

## 2019-01-21 DIAGNOSIS — M152 Bouchard's nodes (with arthropathy): Secondary | ICD-10-CM | POA: Diagnosis not present

## 2019-01-21 DIAGNOSIS — N400 Enlarged prostate without lower urinary tract symptoms: Secondary | ICD-10-CM | POA: Diagnosis not present

## 2019-01-21 DIAGNOSIS — N183 Chronic kidney disease, stage 3 (moderate): Secondary | ICD-10-CM | POA: Diagnosis not present

## 2019-01-21 DIAGNOSIS — G301 Alzheimer's disease with late onset: Secondary | ICD-10-CM | POA: Diagnosis not present

## 2019-01-21 DIAGNOSIS — I4891 Unspecified atrial fibrillation: Secondary | ICD-10-CM | POA: Diagnosis not present

## 2019-01-21 DIAGNOSIS — M47816 Spondylosis without myelopathy or radiculopathy, lumbar region: Secondary | ICD-10-CM | POA: Diagnosis not present

## 2019-01-21 DIAGNOSIS — J40 Bronchitis, not specified as acute or chronic: Secondary | ICD-10-CM | POA: Diagnosis not present

## 2019-02-09 DIAGNOSIS — G4733 Obstructive sleep apnea (adult) (pediatric): Secondary | ICD-10-CM | POA: Diagnosis not present

## 2019-02-12 DIAGNOSIS — Z20828 Contact with and (suspected) exposure to other viral communicable diseases: Secondary | ICD-10-CM | POA: Diagnosis not present

## 2019-02-21 DIAGNOSIS — R531 Weakness: Secondary | ICD-10-CM | POA: Diagnosis not present

## 2019-02-21 DIAGNOSIS — G301 Alzheimer's disease with late onset: Secondary | ICD-10-CM | POA: Diagnosis not present

## 2019-02-25 ENCOUNTER — Other Ambulatory Visit: Payer: Self-pay

## 2019-02-25 ENCOUNTER — Ambulatory Visit (INDEPENDENT_AMBULATORY_CARE_PROVIDER_SITE_OTHER): Payer: PPO | Admitting: Neurology

## 2019-02-25 ENCOUNTER — Encounter: Payer: Self-pay | Admitting: Neurology

## 2019-02-25 ENCOUNTER — Ambulatory Visit: Payer: PPO | Admitting: Adult Health

## 2019-02-25 ENCOUNTER — Telehealth: Payer: Self-pay | Admitting: Neurology

## 2019-02-25 VITALS — BP 110/64 | HR 80 | Temp 96.8°F | Ht 69.5 in | Wt 173.0 lb

## 2019-02-25 DIAGNOSIS — R269 Unspecified abnormalities of gait and mobility: Secondary | ICD-10-CM | POA: Diagnosis not present

## 2019-02-25 DIAGNOSIS — R4189 Other symptoms and signs involving cognitive functions and awareness: Secondary | ICD-10-CM | POA: Diagnosis not present

## 2019-02-25 DIAGNOSIS — F0391 Unspecified dementia with behavioral disturbance: Secondary | ICD-10-CM

## 2019-02-25 DIAGNOSIS — W19XXXA Unspecified fall, initial encounter: Secondary | ICD-10-CM

## 2019-02-25 NOTE — Progress Notes (Signed)
[id number]    Provider:  Dr Jaynee Eagles Referring Provider: [name], MD Primary Care Provider:  [name], MD  CC: Dementia with behavioral disturbance   Interval history 02/25/2019: Improved agitation on seroquel, sleeping very well, his shoulders and legs are not doing well, he can't walk his dog with the walker. Wife says his walking did decline but currently not getting worse, he has been stable recently, there was a change with balance and loss of appetite, has needed more help dressing, not progressive however. In the last month he has fallen 3 times. He falls backwards.  Prior to that he fell at [facility]. He started the Eliquis in April from Coumadin due to afib.  Patient's wife feels stress due to his decreased agitation and Seroquel.  She does worry about the side effects that we discussed however for her safety and patient safety and her sanity she feels the Seroquel is helping.  We discussed that his recent worsening in gait and falls could be a progression of his dementia however cannot rule out that the Seroquel is not contributing.  Discussed the benefits and risks and she would like to still continue the Seroquel.  We also discussed due to his falls, he is at increased risk for bleeding since he is on Eliquis and aspirin.  However if he is off the Eliquis he is at increased risk for stroke from A. fib and if he stops the aspirin he is at increased risk for stroke from atherosclerotic etiologies.  We also discussed fall precautions, if patient is away it be ideal if she could SM onset with him or she may want to monitor him with remote cameras.  The risk is that he falls and cannot reach a phone.  If he wears monitoring device over his neck he may forget to press it when he falls.  We also discussed memory unit or nursing home and at this time it is just too expensive and she prefers to have him at home.  Other things are significantly improved since starting the  Seroquel.  Interval history 11/19/2018: Discussed patient worsening behavior, dementia progression, caregiver burnout, a memory unit, risks of medication especially anti-osychotics which are not fda approved in the elderly and carry black box warning for morbidity and mortality. Wife understands, says she can;t ;ive this way, tried to comfort her. Will continue to increase Seroquel as tolerated up to 200mg  twice daily. Can try SSRI next (Tried Sertraline low dose in the past, try something different). After this can try to increase Depakote if needed to 1g BID. Patient's wife to email me and we can further titrate or add. Discussed risk of all these medications including significant morbidity and mortality. At this time his behavior is impacting safety in the home for patient and wife so feel side effects are warranted.  HPI:  [name] is a 85 y.o. male here as requested by provider [name], MD for dementia evaluation.  Past medical history of erectile dysfunction, impaired fasting glucose, pulmonary hypertension, chronic kidney disease stage III, long-term use of anticoagulant, high cholesterol, chronic fatigue, mild dementia on galantamine with some behavioral issues, low-dose sertraline is not helped.  He also has A. fib, obstructive sleep apnea on CPAP doing well, pacemaker. Patient here with his wife. Symptoms started in 2013 slowly progressive sine then. He was diagnosed with Alzheimers disease. He was seeing someone in [facility]. They saw Dr. Brett Fairy in 2013. He saw Dr. Vikki Ports at [facility]  as well as Dr. Verdene Rio. He is having anger and agitation. Not helping. His personality is changing. Wife provides much information. He has changed in recent years. He gets very mad at his wife. Yesterday morning he was getting ready for church, wife doesn;t go anymore, he gets vey upset, screaming and yelling. He is getting closer to more physically, nudging her. Happening every day. He gets very  agitated. His parents never really aged they had strokes earlier in life. He has a pacemaker and cannot get    Reviewed notes, labs and imaging from outside physicians, which showed:  Reviewed notes from Dr. Laurann Montana, patient has been diagnosed with dementia on galantamine with some behavioral issues, low-dose sertraline has not helped.  No side effects.  It appears his sertraline was increased to 50 mg at last appointment.  He has a history of mild dementia.  He has some increased symptoms recently.  Short-term memory is very poor.  Cannot remember the names of new people he meets some of his older friends in his exercise group having trouble.  He is driving in circles on several occasions.  He still very active going to exercise and swim class.  He has had some issues with giving out information inappropriately on the phone.  Had to cancel credit card several times.  He no longer has credit cards and computer and landline has been disconnected.  His wife thinks is been a change in his personality more irritable and sometimes saying inappropriate things.  Clonazepam was stopped.  Reviewed CT of the head : 1. Atrophy and chronic microvascular ischemia. No acute intracranial abnormality. 2. Multiple facial fractures including nasal bone , nasal septum, left maxillary sinus, and left inferior orbital rim 3. Cervical spondylosis.  Negative for cervical fracture.  Review of Systems: Patient complains of symptoms per HPI as well as the following symptoms: Easy bruising, joint pain, joint swelling, aching muscles, memory loss, confusion, difficulty swallowing, restless legs, decreased energy. Pertinent negatives and positives per HPI. All others negative.   Social History   Socioeconomic History   Marital status: Married    Spouse name: Not on file   Number of children: 3   Years of education: 16   Highest education level: Bachelor's degree (e.g., BA, AB, BS)  Occupational History    Occupation: Retired Technical sales engineer: RETIRED  Social Designer, fashion/clothing strain: Not on file   Food insecurity    Worry: Not on file    Inability: Not on file   Transportation needs    Medical: Not on file    Non-medical: Not on file  Tobacco Use   Smoking status: Never Smoker   Smokeless tobacco: Never Used  Substance and Sexual Activity   Alcohol use: No   Drug use: No   Sexual activity: Not on file  Lifestyle   Physical activity    Days per week: Not on file    Minutes per session: Not on file   Stress: Not on file  Relationships   Social connections    Talks on phone: Not on file    Gets together: Not on file    Attends religious service: Not on file    Active member of club or organization: Not on file    Attends meetings of clubs or organizations: Not on file    Relationship status: Not on file   Intimate partner violence    Fear of Rabadi or ex partner: Not on file  Emotionally abused: Not on file    Physically abused: Not on file    Forced sexual activity: Not on file  Other Topics Concern   Not on file  Social History Narrative   Lives at home with his wife   Right handed    Family History  Problem Relation Age of Onset   Stroke Mother    Stroke Father    Dementia Neg Hx     Past Medical History:  Diagnosis Date   Allergic rhinitis    Arthritis    Atrial fibrillation (HCC)    BPH (benign prostatic hyperplasia)    Bronchitis    Chronic fatigue    CKD (chronic kidney disease), stage III ([place])    Complete heart block ([place])    pacemaker originally placed in 1988   Dementia ([place])    Depression    DJD (degenerative joint disease)    hands   Elevated prolactin level ([place])    related to Reglan   Erectile dysfunction    Esophageal reflux    GERD (gastroesophageal reflux disease)    Heart disease    Hx of pulmonary hypertension    Dr. Rayann Heman   Hyperlipidemia    Hypertension    Lung  nodule    noted on CXR December 2012   Normal nuclear stress test 2013   OSA (obstructive sleep apnea)    uses cpap   Permanent atrial fibrillation    on coumadin   Pneumonia    Prepatellar bursitis 2010   right   Presence of permanent cardiac pacemaker    Pulmonary nodule    RLL   Renal cyst    cysts   Renal insufficiency    Rt kidney removed d/t maglinant tumor   Right heart failure ([place])    Dr. Rayann Heman   RLS (restless legs syndrome)    Shingles    Sick sinus syndrome ([place])    Stroke ([place])    TIA greater than 20 years   Transitional cell carcinoma ([place])    s/p nephrectomy in  2010    Patient Active Problem List   Diagnosis Date Noted   Gait abnormality 02/25/2019   Closed displaced fracture of left femoral neck ([place]) 12/05/2017   Status post total replacement of left hip    Benign essential HTN    History of CVA (cerebrovascular accident)    Cardiac pacemaker in situ    Postoperative pain    Acute blood loss anemia    Closed left hip fracture (HCC) 11/29/2017   Lower leg edema 11/29/2017   Fall 11/29/2017   Essential hypertension 11/29/2017   Stroke (cerebrum) ([place]) 11/29/2017   BPH (benign prostatic hyperplasia) 11/29/2017   Dementia with behavioral disturbance ([place]) 11/29/2017   CKD (chronic kidney disease), stage III ([place]) 11/29/2017   Closed right hip fracture, initial encounter ([place]) 11/29/2017   Diarrhea 11/29/2017   Osteoarthritis of fifth carpometacarpal joint of left hand 07/18/2017   Lung nodule 02/21/2016   SIRS (systemic inflammatory response syndrome) ([place]) 01/02/2016   Sepsis ([place]) 01/02/2016   Encounter for therapeutic drug monitoring 09/23/2013   Premature ventricular contraction 06/03/2013   Pacemaker-Medtronic 01/04/2012   Dyspnea 10/28/2011   Complete heart block ([place]) 09/05/2011   Long term ([place]) use of anticoagulants 02/04/2011   BRADYCARDIA-TACHYCARDIA SYNDROME 08/16/2010   ALLERGIC  RHINITIS 12/24/2007   Obstructive sleep apnea 08/17/2007   RESTLESS LEG SYNDROME 08/17/2007   FIBRILLATION, ATRIAL 08/17/2007   Chronic bronchitis ([place]) 08/17/2007   Esophageal reflux  08/17/2007   Insomnia secondary to chronic pain 08/17/2007   CHEYNE-STOKES RESPIRATION 08/17/2007    Past Surgical History:  Procedure Laterality Date   CARDIOVASCULAR STRESS TEST  03/23/2009   EF 65%, NORMAL   CATARACT EXTRACTION W/PHACO Left 08/26/2015   Procedure: CATARACT EXTRACTION PHACO AND INTRAOCULAR LENS PLACEMENT ([place]);  Surgeon: Marylynn Pearson, MD;  Location: [place];  Service: Ophthalmology;  Laterality: Left;   DOPPLER ECHOCARDIOGRAPHY  03/26/2001   EF 55%   EYE SURGERY     tumor removed from Rt eye lid   HERNIA REPAIR     multiple   hydrocelectomy Right    MASS EXCISION Left 11/07/2012   Procedure: MINOR EXCISION OF MASS;  Surgeon: Haywood Lasso, MD;  Location: [place];  Service: General;  Laterality: Left;   multiple basal cell carcinomas     NEPHRECTOMY  2010   rt   [place]   most recent generator change by Dr Rayann Heman 20067/2/13 with new right ventricular lead placed   PERMANENT PACEMAKER GENERATOR CHANGE N/A 01/03/2012   Procedure: PERMANENT PACEMAKER GENERATOR CHANGE;  Surgeon: Thompson Grayer, MD;  Location: [facility] CATH LAB;  Service: Cardiovascular;  Laterality: N/A;   TOTAL HIP ARTHROPLASTY Left 12/04/2017   Procedure: TOTAL HIP ARTHROPLASTY ANTERIOR APPROACH WITH CABLE;  Surgeon: Rod Can, MD;  Location: [place];  Service: Orthopedics;  Laterality: Left;   US ECHOCARDIOGRAPHY  04/22/2008   EF 55-60%    [facility] Medications  Medication Sig Dispense Refill   acetaminophen (TYLENOL) 325 MG tablet Take 650 mg by mouth every 4 (four) hours as needed for mild pain or fever.      apixaban (ELIQUIS) 2.5 MG TABS tablet Take 1 tablet (2.5 mg total) by mouth 2 (two) times daily. 60 tablet 5   aspirin EC 81 MG tablet Take 81 mg  by mouth daily.     cycloSPORINE (RESTASIS) 0.05 % ophthalmic emulsion Place 1 drop into both eyes 2 (two) times daily.      divalproex (DEPAKOTE) 500 MG DR tablet Take 1 tablet (500 mg total) by mouth 2 (two) times daily. 60 tablet 11   doxazosin (CARDURA) 2 MG tablet Take 2 mg by mouth at bedtime.      finasteride (PROSCAR) 5 MG tablet Take 5 mg by mouth daily.     galantamine (RAZADYNE) 8 MG tablet Take 8 mg by mouth 2 (two) times daily.      glucosamine-chondroitin 500-400 MG tablet Take 1 tablet by mouth 2 (two) times daily.      Hypromellose (ISOPTO TEARS OP) Place 1 drop into both eyes 2 (two) times daily.      ipratropium (ATROVENT) 0.06 % nasal spray Place 2 sprays into both nostrils 2 (two) times daily as needed for rhinitis.      metoprolol tartrate (LOPRESSOR) 25 MG tablet Take 25 mg by mouth 2 (two) times daily.     Multiple Vitamin (MULTIVITAMIN) capsule Take 1 capsule by mouth daily.       pantoprazole (PROTONIX) 40 MG tablet Take 40 mg by mouth daily.  5   QUEtiapine (SEROQUEL) 100 MG tablet Take 125mg  twice daily (use 100mg  pill + 25mg  pill). In one week may increase to 150mg  twice daily (100mg  pill and 2x25mg ) 60 tablet 6   furosemide (LASIX) 40 MG tablet Take 40 mg by mouth daily.     No [place] facility-administered medications for this visit.     Allergies as of 02/25/2019   (No  Known Allergies)    Vitals: BP 110/64 (BP Location: Right Arm, Patient Position: Sitting)    Pulse 80    Temp (!) 96.8 F (36 C) Comment: wife temp 96.6,both taken by check-in staff;   Ht 5' 9.5" (1.765 m)    Wt 173 lb (78.5 kg)    BMI 25.18 kg/m  Last Weight:  Wt Readings from Last 1 Encounters:  02/25/19 173 lb (78.5 kg)   Last Height:   Ht Readings from Last 1 Encounters:  02/25/19 5' 9.5" (1.765 m)     Physical exam: Exam: Gen: NAD, decreased attention and concentration, speech fluent, follows simple commands CV: RRR, +SEM. No Carotid Bruits. No peripheral edema,  warm, nontender Eyes: Conjunctivae clear without exudates or hemorrhage  Neuro: Detailed Neurologic Exam  Speech:    Speech is normal; fluent and spontaneous with impaired comprehension.  Cognition:     MMSE - Mini Mental State Exam 08/06/2018  Orientation to time 4  Orientation to Place 5  Registration 3  Attention/ Calculation 5  Recall 1  Language- name 2 objects 2  Language- repeat 1  Language- follow 3 step command 3  Language- read & follow direction 1  Write a sentence 1  Copy design 1  Total score 27    Cranial Nerves:    The pupils are pinpoint. Attempted fundoscopy could not visualize due to small pupils.  Visual fields are full to threat. Extraocular movements are intact. Trigeminal sensation is intact and the muscles of mastication are normal. The face is symmetric. The palate elevates in the midline. Hearing intact to voice. Voice is normal. Shoulder shrug is normal. The tongue has normal motion without fasciculations.   Coordination:    No dysmetria with FTN  Gait:    Shuffling with a walker, decreased arm swing, imbalance  Motor Observation:    Repetitive mouth movements not seen on exam today Tone:    Normal muscle tone.    Posture:    Posture is erect    Strength:    Strength is V/V in the upper and lower limbs.      Sensation: intact to LT      Assessment/Plan:  85 year old with progressive dementia, worsening behavior problems, agitation, outbursts, becoming more physical. His exam shows +frontal release signs, likely dementia progressively  involving the frontal lobe vs lewy body given his parkinsonism.   Will continue to increase Seroquel as tolerated up to 200mg  twice daily. Can try SSRI next (Tried Sertraline low dose in the past, try something different). After this can try to increase Depakote if needed to 1g BID. Patient's wife to email me and we can further titrate or add if needed but he is doing much better on [place] doses. Discussed risk  of all these medications including significant morbidity and mortality. At this time his behavior is impacting safety in the home for patient and wife so feel side effects are warranted.  Continue seroquel for aggressive and dangerous behavior, delusions, hallucinations - improved Consider starting an ssri and increasing dose Keep depakote for now, may increase at a later time Cant afford a memory unit, discussed safety, monitoring, caregiver burnout Continue the galantamine, Consider Namenda (Memantine) however will likely provide few clinical results in this stage of dementia CT of the head due to worsening gait, he has a pacemaker cannot get an MRI and he is on eliquis and asa ned to eval for bleeding Eliquis and asa: discussed risks of bleeding vs risks of  stroke especially given his imbalance and falls. Wife prefers to stay on these currently Fall precautions MMSE at next appointment  Orders Placed This Encounter  Procedures   CT HEAD WO CONTRAST     Cc: [name], MD   A total of 25 minutes was spent face-to-face with this patient. Over half this time was spent on counseling patient on the  1. Dementia with behavioral disturbance, unspecified dementia type ([place])   2. Fall, initial encounter   3. Cognitive decline   4. Gait abnormality    diagnosis and different diagnostic and therapeutic options, counseling and coordination of care, risks ans benefits of management, compliance, or risk factor reduction and education.     Sarina Ill, MD  [facility] [address] [place], [place] [address]  Phone [phone] Fax [phone]

## 2019-02-25 NOTE — Telephone Encounter (Signed)
health team order sent to GI. No auth they will reach out to the patient to schedule.  

## 2019-02-25 NOTE — Patient Instructions (Addendum)

## 2019-02-27 ENCOUNTER — Ambulatory Visit: Payer: Self-pay | Admitting: Neurology

## 2019-03-04 MED ORDER — APIXABAN 2.5 MG PO TABS: 2.5000 mg | ORAL_TABLET | Freq: Two times a day (BID) | ORAL | 5 refills | Status: DC

## 2019-03-06 DIAGNOSIS — N3941 Urge incontinence: Secondary | ICD-10-CM | POA: Diagnosis not present

## 2019-03-06 DIAGNOSIS — C651 Malignant neoplasm of right renal pelvis: Secondary | ICD-10-CM | POA: Diagnosis not present

## 2019-03-06 DIAGNOSIS — N401 Enlarged prostate with lower urinary tract symptoms: Secondary | ICD-10-CM | POA: Diagnosis not present

## 2019-03-07 ENCOUNTER — Other Ambulatory Visit: Payer: PPO

## 2019-03-07 ENCOUNTER — Ambulatory Visit
Admission: RE | Admit: 2019-03-07 | Discharge: 2019-03-07 | Disposition: A | Discharge status: Home / Self Care | Payer: PPO | Source: Ambulatory Visit | Attending: Neurology | Admitting: Neurology

## 2019-03-07 ENCOUNTER — Other Ambulatory Visit: Payer: Self-pay

## 2019-03-07 DIAGNOSIS — W19XXXA Unspecified fall, initial encounter: Secondary | ICD-10-CM

## 2019-03-07 DIAGNOSIS — R4189 Other symptoms and signs involving cognitive functions and awareness: Secondary | ICD-10-CM

## 2019-03-12 DIAGNOSIS — G4733 Obstructive sleep apnea (adult) (pediatric): Secondary | ICD-10-CM | POA: Diagnosis not present

## 2019-03-13 ENCOUNTER — Other Ambulatory Visit: Payer: Self-pay | Admitting: Internal Medicine

## 2019-03-13 NOTE — Telephone Encounter (Signed)
Pt last saw Dr Lovena Le 08/24/18, last labs 08/06/18 Creat 1.61. age 85, weight 78.5kg, based on specified criteria pt is on appropriate dosage of Eliquis 2.5mg  BID.  Will refill rx.

## 2019-03-14 ENCOUNTER — Telehealth: Payer: Self-pay

## 2019-03-14 NOTE — Telephone Encounter (Signed)
Left a message regarding appt on 03/18/19. 

## 2019-03-15 ENCOUNTER — Ambulatory Visit (INDEPENDENT_AMBULATORY_CARE_PROVIDER_SITE_OTHER): Payer: PPO | Admitting: *Deleted

## 2019-03-15 ENCOUNTER — Telehealth: Payer: Self-pay | Admitting: Internal Medicine

## 2019-03-15 DIAGNOSIS — I442 Atrioventricular block, complete: Secondary | ICD-10-CM

## 2019-03-15 DIAGNOSIS — I495 Sick sinus syndrome: Secondary | ICD-10-CM

## 2019-03-15 NOTE — Telephone Encounter (Signed)
Spoke with patient's wife. She is very concerned that pt has not sent a transmission since 2018 and has an upcoming virtual visit with Dr. Rayann Heman on Monday, 03/18/19. She is requesting in-office f/u.  Explained that pt's monitor should still work, as long as they have the [employer]. Assisted pt's wife with monitor setup and with transmission. Transmission received and added to schedule for processing. Explained remaining battery longevity is estimated at 15 months (range: <4-27 months) and that lead trends are stable. Advised I will schedule another transmission 91 days from today. Pt's wife reports that as transmission was successful, they will keep his virtual visit on Monday. She is aware of how to access visit. Will make staff aware not to cancel appointment.  Will also order a new [employer] monitor [phone]) on Monday per wife's request. Confirmed mailing address. Gave direct DC number for when they receive new monitor, will assist with setup at that time. Pt's wife denies questions or concerns at this time.

## 2019-03-15 NOTE — Telephone Encounter (Signed)
New Message   Patients spouse is calling on his behalf. She states that he has a transmission unit but they do not have a home phone so they need to know what they can do. Please call.     1. Has your device fired? no  2. Is you device beeping? no  3. Are you experiencing draining or swelling at device site? no  4. Are you calling to see if we received your device transmission? no  5. Have you passed out? no    Please route to [place]

## 2019-03-15 NOTE — Telephone Encounter (Signed)
Wife returning call

## 2019-03-17 LAB — CUP PACEART REMOTE DEVICE CHECK
Battery Impedance: 3511 Ohm
Battery Remaining Longevity: 15 mo
Battery Voltage: 2.7 V
Brady Statistic RV Percent Paced: 99 %
Date Time Interrogation Session: 20200911222355
Implantable Lead Implant Date: 20130702
Implantable Lead Location: 753860
Implantable Lead Model: 5076
Implantable Pulse Generator Implant Date: 20130702
Lead Channel Impedance Value: 0 Ohm
Lead Channel Impedance Value: 438 Ohm
Lead Channel Pacing Threshold Amplitude: 0.75 V
Lead Channel Pacing Threshold Pulse Width: 0.4 ms
Lead Channel Setting Pacing Amplitude: 2.5 V
Lead Channel Setting Pacing Pulse Width: 0.4 ms
Lead Channel Setting Sensing Sensitivity: 4 mV

## 2019-03-18 ENCOUNTER — Telehealth (INDEPENDENT_AMBULATORY_CARE_PROVIDER_SITE_OTHER): Payer: PPO | Admitting: Internal Medicine

## 2019-03-18 ENCOUNTER — Encounter: Payer: Self-pay | Admitting: Internal Medicine

## 2019-03-18 ENCOUNTER — Other Ambulatory Visit: Payer: Self-pay | Admitting: Neurology

## 2019-03-18 VITALS — BP 132/84 | HR 76 | Ht 69.5 in | Wt 165.0 lb

## 2019-03-18 DIAGNOSIS — Z8673 Personal history of transient ischemic attack (TIA), and cerebral infarction without residual deficits: Secondary | ICD-10-CM | POA: Diagnosis not present

## 2019-03-18 DIAGNOSIS — I442 Atrioventricular block, complete: Secondary | ICD-10-CM | POA: Diagnosis not present

## 2019-03-18 DIAGNOSIS — F0391 Unspecified dementia with behavioral disturbance: Secondary | ICD-10-CM

## 2019-03-18 DIAGNOSIS — I4821 Permanent atrial fibrillation: Secondary | ICD-10-CM

## 2019-03-18 NOTE — Progress Notes (Signed)
Electrophysiology TeleHealth Note  Due to national recommendations of social distancing due to [place] 19, an audio telehealth visit is felt to be most appropriate for this patient at this time.  Verbal consent was obtained by me for the telehealth visit today.  The patient does not have capability for a virtual visit.  A phone visit is therefore required today.   Date:  03/18/2019   ID:  [name], DOB [birth year], MRN [id number]  Location: patient's home  Provider location:  [place]  Evaluation Performed: Follow-up visit  PCP:  [name], MD   Electrophysiologist:  Dr Rayann Heman  Chief Complaint:  palpitations  History of Present Illness:    [name] is a 85 y.o. male who presents via telehealth conferencing today.  Since last being seen in our clinic, the patient reports doing very well.  Today, he denies symptoms of palpitations, chest pain, shortness of breath, dizziness, presyncope, or syncope.  His edema is stable.  His primary concern is with joint pain.  He is not very active.  He has had moderate decline over the past few years.  He is in respite care.  He walks with a walker. He has had a number of falls over the past few months due to unsteadiness.  The patient is otherwise without complaint today.   Past Medical History:  Diagnosis Date  . Allergic rhinitis   . Arthritis   . Atrial fibrillation ([place])   . BPH (benign prostatic hyperplasia)   . Bronchitis   . Chronic fatigue   . CKD (chronic kidney disease), stage III ([place])   . Complete heart block ([place])    pacemaker originally placed in 1988  . Dementia ([place])   . Depression   . DJD (degenerative joint disease)    hands  . Elevated prolactin level (HCC)    related to Reglan  . Erectile dysfunction   . Esophageal reflux   . GERD (gastroesophageal reflux disease)   . Heart disease   . Hx of pulmonary hypertension    Dr. Rayann Heman  . Hyperlipidemia   . Hypertension   . Lung nodule    noted  on CXR December 2012  . Normal nuclear stress test 2013  . OSA (obstructive sleep apnea)    uses cpap  . Permanent atrial fibrillation    on coumadin  . Pneumonia   . Prepatellar bursitis 2010   right  . Presence of permanent cardiac pacemaker   . Pulmonary nodule    RLL  . Renal cyst    cysts  . Renal insufficiency    Rt kidney removed d/t maglinant tumor  . Right heart failure (HCC)    Dr. Rayann Heman  . RLS (restless legs syndrome)   . Shingles   . Sick sinus syndrome ([place])   . Stroke [facility])    TIA greater than 20 years  . Transitional cell carcinoma ([place])    s/p nephrectomy in  2010    Past Surgical History:  Procedure Laterality Date  . CARDIOVASCULAR STRESS TEST  03/23/2009   EF 65%, NORMAL  . CATARACT EXTRACTION W/PHACO Left 08/26/2015   Procedure: CATARACT EXTRACTION PHACO AND INTRAOCULAR LENS PLACEMENT (IOC);  Surgeon: Marylynn Pearson, MD;  Location: [place];  Service: Ophthalmology;  Laterality: Left;  . DOPPLER ECHOCARDIOGRAPHY  03/26/2001   EF 55%  . EYE SURGERY     tumor removed from Rt eye lid  . HERNIA REPAIR     multiple  .  hydrocelectomy Right   . MASS EXCISION Left 11/07/2012   Procedure: MINOR EXCISION OF MASS;  Surgeon: Haywood Lasso, MD;  Location: [place];  Service: General;  Laterality: Left;  . multiple basal cell carcinomas    . NEPHRECTOMY  2010   rt  . PACEMAKER INSERTION  1988   most recent generator change by Dr Rayann Heman 20067/2/13 with new right ventricular lead placed  . PERMANENT PACEMAKER GENERATOR CHANGE N/A 01/03/2012   Procedure: PERMANENT PACEMAKER GENERATOR CHANGE;  Surgeon: Thompson Grayer, MD;  Location: [facility] CATH LAB;  Service: Cardiovascular;  Laterality: N/A;  . TOTAL HIP ARTHROPLASTY Left 12/04/2017   Procedure: TOTAL HIP ARTHROPLASTY ANTERIOR APPROACH WITH CABLE;  Surgeon: Rod Can, MD;  Location: [place];  Service: Orthopedics;  Laterality: Left;  . US ECHOCARDIOGRAPHY  04/22/2008   EF 55-60%    [facility]  Medications  Medication Sig Dispense Refill  . acetaminophen (TYLENOL) 325 MG tablet Take 650 mg by mouth every 4 (four) hours as needed for mild pain or fever.     Marland Kitchen aspirin EC 81 MG tablet Take 81 mg by mouth daily.    . cycloSPORINE (RESTASIS) 0.05 % ophthalmic emulsion Place 1 drop into both eyes 2 (two) times daily.     . divalproex (DEPAKOTE) 500 MG DR tablet Take 1 tablet (500 mg total) by mouth 2 (two) times daily. 60 tablet 11  . doxazosin (CARDURA) 2 MG tablet Take 2 mg by mouth at bedtime.     Marland Kitchen ELIQUIS 2.5 MG TABS tablet TAKE 1 TABLET BY MOUTH 2 (TWO) TIMES DAILY. TO REPLACE WARFARIN 60 tablet 6  . finasteride (PROSCAR) 5 MG tablet Take 5 mg by mouth daily.    . furosemide (LASIX) 40 MG tablet Take 40 mg by mouth daily.    Marland Kitchen galantamine (RAZADYNE) 8 MG tablet Take 8 mg by mouth 2 (two) times daily.     . Hypromellose (ISOPTO TEARS OP) Place 1 drop into both eyes 2 (two) times daily.     Marland Kitchen ipratropium (ATROVENT) 0.06 % nasal spray Place 2 sprays into both nostrils 2 (two) times daily as needed for rhinitis.     . metoprolol tartrate (LOPRESSOR) 25 MG tablet Take 25 mg by mouth 2 (two) times daily.    . Multiple Vitamin (MULTIVITAMIN) capsule Take 1 capsule by mouth daily.      . pantoprazole (PROTONIX) 40 MG tablet Take 40 mg by mouth daily.  5  . QUEtiapine (SEROQUEL) 100 MG tablet Take 125mg  twice daily (use 100mg  pill + 25mg  pill). In one week may increase to 150mg  twice daily (100mg  pill and 2x25mg ) 60 tablet 6   No [place] facility-administered medications for this visit.     Allergies:   Patient has no known allergies.   Social History:  The patient  reports that he has never smoked. He has never used smokeless tobacco. He reports that he does not drink alcohol or use drugs.   Family History:  The patient's family history includes Stroke in his father and mother.   ROS:  Please see the history of present illness.   All other systems are personally reviewed and negative.     Exam:    Vital Signs:  BP 132/84   Pulse 76   Ht 5' 9.5" (1.765 m)   Wt 165 lb (74.8 kg)   BMI 24.02 kg/m   Well sounding, alert   Labs/Other Tests and Data Reviewed:    Recent Labs: 07/07/2018:  Hemoglobin 10.3; Platelets 203 08/06/2018: ALT 15; BUN 30; Creatinine, Ser 1.61; Potassium 5.0; Sodium 143   Wt Readings from Last 3 Encounters:  03/18/19 165 lb (74.8 kg)  02/25/19 173 lb (78.5 kg)  11/15/18 174 lb (78.9 kg)     Last device remote is reviewed from [place] PDF which reveals normal device function,    ASSESSMENT & PLAN:    1. Permanent afib Rate controlled chads2vasc score is 5 Stop ASA I had long discussion with the patient and his wife today.  They are aware of risks and benefits to anticoagulation.  He has had a number of recent falls, including head trauma and facial fractures.  His wife will discuss possibly discontinuation of eliquis with the patient's children.  They will alert my office if he decides to stop eliquis.  2. Complete heart block Remotes are uptodate Normal device function  3. NSVT Asymptomatic  4. Code status He is DNR/DNI.  His wife is POA and clear that this is appropriate for Mr Fouse.  Follow-up:  12 months with Renee   Patient Risk:  after full review of this patients clinical status, I feel that they are at moderate risk at this time.  Today, I have spent 15 minutes with the patient with telehealth technology discussing arrhythmia management .    Army Fossa, MD  03/18/2019 2:09 PM     [place]  [id number] [phone] (office) [phone] (fax)

## 2019-03-19 NOTE — Telephone Encounter (Signed)
New Carelink monitor ordered 03/19/19 to patient's home address. 

## 2019-03-22 NOTE — Progress Notes (Signed)
Remote pacemaker transmission.   

## 2019-03-27 ENCOUNTER — Telehealth: Payer: Self-pay | Admitting: Neurology

## 2019-03-27 NOTE — Telephone Encounter (Signed)
Veronica from [facility] through Medicare called wanting to know if the pt can get a referral for [place]. Please advise.

## 2019-03-28 NOTE — Telephone Encounter (Signed)
This is what she sent. I have asked her to give me more orders on exactly what I am ordering.  Dr. Jaynee Eagles,   The order you created for home health services must be submitted to a home health agency. Please see some of the options below that accept patient's insurance [facility] [employer]). Once the order is sent to a home health agency, they will contact caregiver directly to initiate services.   [facility]  Thanks for all of your assistance and please feel free to reach out with any questions. Regards,   Ebony

## 2019-03-29 NOTE — Telephone Encounter (Signed)
See note below. Response was the following: Dr. Jaynee Eagles, the spouse wishes is for [facility]. Per Salomon Fick) the most recent provider note and the reason for PT (in this case it is related to strengthening for falls/ Safety concerns). I asked for the order to be expedited due to safety issues, the agency is awaiting your order prior to checking for availability in the area. Thanks in advance for your assistance and look forward to your response.  Jovita Kussmaul, RN, BSN, BS   Hinton Dyer, what order should I place? thanks

## 2019-04-01 ENCOUNTER — Other Ambulatory Visit: Payer: Self-pay | Admitting: Neurology

## 2019-04-01 DIAGNOSIS — F0391 Unspecified dementia with behavioral disturbance: Secondary | ICD-10-CM

## 2019-04-01 DIAGNOSIS — Z9181 History of falling: Secondary | ICD-10-CM

## 2019-04-01 DIAGNOSIS — R419 Unspecified symptoms and signs involving cognitive functions and awareness: Secondary | ICD-10-CM

## 2019-04-01 NOTE — Telephone Encounter (Signed)
DR. Krista Blue please place referral for [place] and put this in comments .   reason for PT (in this case it is related to strengthening for falls/ Safety concerns).  Thanks Hinton Dyer.

## 2019-04-01 NOTE — Telephone Encounter (Signed)
Home Health referral for nursing and in comments put in safety eval..

## 2019-04-01 NOTE — Telephone Encounter (Signed)
Do I need to do anything more? If not please call patient's wife and let her know. Thanks!

## 2019-04-01 NOTE — Telephone Encounter (Signed)
Done. Thank you.

## 2019-04-02 NOTE — Telephone Encounter (Signed)
I have sent to [facility] with Alvis Lemmings and asked if he can start patient asap.

## 2019-04-03 ENCOUNTER — Telehealth: Payer: Self-pay | Admitting: *Deleted

## 2019-04-03 DIAGNOSIS — I4891 Unspecified atrial fibrillation: Secondary | ICD-10-CM | POA: Diagnosis not present

## 2019-04-03 DIAGNOSIS — N183 Chronic kidney disease, stage 3 (moderate): Secondary | ICD-10-CM | POA: Diagnosis not present

## 2019-04-03 DIAGNOSIS — M47816 Spondylosis without myelopathy or radiculopathy, lumbar region: Secondary | ICD-10-CM | POA: Diagnosis not present

## 2019-04-03 DIAGNOSIS — J40 Bronchitis, not specified as acute or chronic: Secondary | ICD-10-CM | POA: Diagnosis not present

## 2019-04-03 DIAGNOSIS — G301 Alzheimer's disease with late onset: Secondary | ICD-10-CM | POA: Diagnosis not present

## 2019-04-03 DIAGNOSIS — M152 Bouchard's nodes (with arthropathy): Secondary | ICD-10-CM | POA: Diagnosis not present

## 2019-04-03 DIAGNOSIS — I272 Pulmonary hypertension, unspecified: Secondary | ICD-10-CM | POA: Diagnosis not present

## 2019-04-03 DIAGNOSIS — N400 Enlarged prostate without lower urinary tract symptoms: Secondary | ICD-10-CM | POA: Diagnosis not present

## 2019-04-03 MED ORDER — QUETIAPINE FUMARATE 50 MG PO TABS: ORAL_TABLET | ORAL | 6 refills | Status: AC

## 2019-04-03 MED ORDER — QUETIAPINE FUMARATE 100 MG PO TABS: ORAL_TABLET | ORAL | 6 refills | Status: AC

## 2019-04-03 NOTE — Telephone Encounter (Signed)
Received call from [facility] pharmacy. She stated the pt's wife said it is hard for him to hold the 25 mg Seroquel tablet. They requested a change from 25 mg tablet to 50 mg tablet. Pt currently is taking 100 mg + two 25 mg tablets twice daily.

## 2019-04-03 NOTE — Addendum Note (Signed)
Addended by: Gildardo Griffes on: 04/03/2019 03:01 PM   Modules accepted: Orders

## 2019-04-03 NOTE — Addendum Note (Signed)
Addended by: Sarina Ill B on: 04/03/2019 03:04 PM   Modules accepted: Orders

## 2019-04-04 DIAGNOSIS — I4821 Permanent atrial fibrillation: Secondary | ICD-10-CM | POA: Diagnosis not present

## 2019-04-04 DIAGNOSIS — N400 Enlarged prostate without lower urinary tract symptoms: Secondary | ICD-10-CM | POA: Diagnosis not present

## 2019-04-04 DIAGNOSIS — I495 Sick sinus syndrome: Secondary | ICD-10-CM | POA: Diagnosis not present

## 2019-04-04 DIAGNOSIS — Z8673 Personal history of transient ischemic attack (TIA), and cerebral infarction without residual deficits: Secondary | ICD-10-CM | POA: Diagnosis not present

## 2019-04-04 DIAGNOSIS — R7301 Impaired fasting glucose: Secondary | ICD-10-CM | POA: Diagnosis not present

## 2019-04-04 DIAGNOSIS — M19042 Primary osteoarthritis, left hand: Secondary | ICD-10-CM | POA: Diagnosis not present

## 2019-04-04 DIAGNOSIS — K219 Gastro-esophageal reflux disease without esophagitis: Secondary | ICD-10-CM | POA: Diagnosis not present

## 2019-04-04 DIAGNOSIS — G3183 Dementia with Lewy bodies: Secondary | ICD-10-CM | POA: Diagnosis not present

## 2019-04-04 DIAGNOSIS — S022XXD Fracture of nasal bones, subsequent encounter for fracture with routine healing: Secondary | ICD-10-CM | POA: Diagnosis not present

## 2019-04-04 DIAGNOSIS — F0281 Dementia in other diseases classified elsewhere with behavioral disturbance: Secondary | ICD-10-CM | POA: Diagnosis not present

## 2019-04-04 DIAGNOSIS — M47812 Spondylosis without myelopathy or radiculopathy, cervical region: Secondary | ICD-10-CM | POA: Diagnosis not present

## 2019-04-04 DIAGNOSIS — G309 Alzheimer's disease, unspecified: Secondary | ICD-10-CM | POA: Diagnosis not present

## 2019-04-04 DIAGNOSIS — F329 Major depressive disorder, single episode, unspecified: Secondary | ICD-10-CM | POA: Diagnosis not present

## 2019-04-04 DIAGNOSIS — G2581 Restless legs syndrome: Secondary | ICD-10-CM | POA: Diagnosis not present

## 2019-04-04 DIAGNOSIS — G4700 Insomnia, unspecified: Secondary | ICD-10-CM | POA: Diagnosis not present

## 2019-04-04 DIAGNOSIS — G4733 Obstructive sleep apnea (adult) (pediatric): Secondary | ICD-10-CM | POA: Diagnosis not present

## 2019-04-04 DIAGNOSIS — I50810 Right heart failure, unspecified: Secondary | ICD-10-CM | POA: Diagnosis not present

## 2019-04-04 DIAGNOSIS — Z7901 Long term (current) use of anticoagulants: Secondary | ICD-10-CM | POA: Diagnosis not present

## 2019-04-04 DIAGNOSIS — N183 Chronic kidney disease, stage 3 unspecified: Secondary | ICD-10-CM | POA: Diagnosis not present

## 2019-04-04 DIAGNOSIS — E785 Hyperlipidemia, unspecified: Secondary | ICD-10-CM | POA: Diagnosis not present

## 2019-04-04 DIAGNOSIS — S0285XD Fracture of orbit, unspecified, subsequent encounter for fracture with routine healing: Secondary | ICD-10-CM | POA: Diagnosis not present

## 2019-04-04 DIAGNOSIS — D62 Acute posthemorrhagic anemia: Secondary | ICD-10-CM | POA: Diagnosis not present

## 2019-04-04 DIAGNOSIS — I272 Pulmonary hypertension, unspecified: Secondary | ICD-10-CM | POA: Diagnosis not present

## 2019-04-04 DIAGNOSIS — G301 Alzheimer's disease with late onset: Secondary | ICD-10-CM | POA: Diagnosis not present

## 2019-04-04 DIAGNOSIS — I130 Hypertensive heart and chronic kidney disease with heart failure and stage 1 through stage 4 chronic kidney disease, or unspecified chronic kidney disease: Secondary | ICD-10-CM | POA: Diagnosis not present

## 2019-04-04 DIAGNOSIS — S02401D Maxillary fracture, unspecified, subsequent encounter for fracture with routine healing: Secondary | ICD-10-CM | POA: Diagnosis not present

## 2019-04-04 NOTE — Telephone Encounter (Signed)
Late entry. I spoke with Dr. Jaynee Eagles and she approved the change from 25 mg Seroquel tablet to 50 mg tablet and to change the directions on both 100 mg and 50 mg tablet prescriptions that clarify directions of taking 150 mg PO BID (using 100 mg + 50 mg tablet).

## 2019-04-05 DIAGNOSIS — R7301 Impaired fasting glucose: Secondary | ICD-10-CM | POA: Diagnosis not present

## 2019-04-05 DIAGNOSIS — G4700 Insomnia, unspecified: Secondary | ICD-10-CM | POA: Diagnosis not present

## 2019-04-05 DIAGNOSIS — I495 Sick sinus syndrome: Secondary | ICD-10-CM | POA: Diagnosis not present

## 2019-04-05 DIAGNOSIS — F0281 Dementia in other diseases classified elsewhere with behavioral disturbance: Secondary | ICD-10-CM | POA: Diagnosis not present

## 2019-04-05 DIAGNOSIS — G2581 Restless legs syndrome: Secondary | ICD-10-CM | POA: Diagnosis not present

## 2019-04-05 DIAGNOSIS — G3183 Dementia with Lewy bodies: Secondary | ICD-10-CM | POA: Diagnosis not present

## 2019-04-05 DIAGNOSIS — M47812 Spondylosis without myelopathy or radiculopathy, cervical region: Secondary | ICD-10-CM | POA: Diagnosis not present

## 2019-04-05 DIAGNOSIS — K219 Gastro-esophageal reflux disease without esophagitis: Secondary | ICD-10-CM | POA: Diagnosis not present

## 2019-04-05 DIAGNOSIS — M19042 Primary osteoarthritis, left hand: Secondary | ICD-10-CM | POA: Diagnosis not present

## 2019-04-05 DIAGNOSIS — S0285XD Fracture of orbit, unspecified, subsequent encounter for fracture with routine healing: Secondary | ICD-10-CM | POA: Diagnosis not present

## 2019-04-05 DIAGNOSIS — F329 Major depressive disorder, single episode, unspecified: Secondary | ICD-10-CM | POA: Diagnosis not present

## 2019-04-05 DIAGNOSIS — S02401D Maxillary fracture, unspecified, subsequent encounter for fracture with routine healing: Secondary | ICD-10-CM | POA: Diagnosis not present

## 2019-04-05 DIAGNOSIS — G309 Alzheimer's disease, unspecified: Secondary | ICD-10-CM | POA: Diagnosis not present

## 2019-04-05 DIAGNOSIS — N400 Enlarged prostate without lower urinary tract symptoms: Secondary | ICD-10-CM | POA: Diagnosis not present

## 2019-04-05 DIAGNOSIS — G4733 Obstructive sleep apnea (adult) (pediatric): Secondary | ICD-10-CM | POA: Diagnosis not present

## 2019-04-05 DIAGNOSIS — I272 Pulmonary hypertension, unspecified: Secondary | ICD-10-CM | POA: Diagnosis not present

## 2019-04-05 DIAGNOSIS — E785 Hyperlipidemia, unspecified: Secondary | ICD-10-CM | POA: Diagnosis not present

## 2019-04-05 DIAGNOSIS — Z7901 Long term (current) use of anticoagulants: Secondary | ICD-10-CM | POA: Diagnosis not present

## 2019-04-05 DIAGNOSIS — S022XXD Fracture of nasal bones, subsequent encounter for fracture with routine healing: Secondary | ICD-10-CM | POA: Diagnosis not present

## 2019-04-05 DIAGNOSIS — N183 Chronic kidney disease, stage 3 unspecified: Secondary | ICD-10-CM | POA: Diagnosis not present

## 2019-04-05 DIAGNOSIS — I50810 Right heart failure, unspecified: Secondary | ICD-10-CM | POA: Diagnosis not present

## 2019-04-05 DIAGNOSIS — Z8673 Personal history of transient ischemic attack (TIA), and cerebral infarction without residual deficits: Secondary | ICD-10-CM | POA: Diagnosis not present

## 2019-04-05 DIAGNOSIS — I4821 Permanent atrial fibrillation: Secondary | ICD-10-CM | POA: Diagnosis not present

## 2019-04-05 DIAGNOSIS — D62 Acute posthemorrhagic anemia: Secondary | ICD-10-CM | POA: Diagnosis not present

## 2019-04-05 DIAGNOSIS — I130 Hypertensive heart and chronic kidney disease with heart failure and stage 1 through stage 4 chronic kidney disease, or unspecified chronic kidney disease: Secondary | ICD-10-CM | POA: Diagnosis not present

## 2019-04-08 DIAGNOSIS — I495 Sick sinus syndrome: Secondary | ICD-10-CM | POA: Diagnosis not present

## 2019-04-08 DIAGNOSIS — S0285XD Fracture of orbit, unspecified, subsequent encounter for fracture with routine healing: Secondary | ICD-10-CM | POA: Diagnosis not present

## 2019-04-08 DIAGNOSIS — E785 Hyperlipidemia, unspecified: Secondary | ICD-10-CM | POA: Diagnosis not present

## 2019-04-08 DIAGNOSIS — F329 Major depressive disorder, single episode, unspecified: Secondary | ICD-10-CM | POA: Diagnosis not present

## 2019-04-08 DIAGNOSIS — G309 Alzheimer's disease, unspecified: Secondary | ICD-10-CM | POA: Diagnosis not present

## 2019-04-08 DIAGNOSIS — G4700 Insomnia, unspecified: Secondary | ICD-10-CM | POA: Diagnosis not present

## 2019-04-08 DIAGNOSIS — S022XXD Fracture of nasal bones, subsequent encounter for fracture with routine healing: Secondary | ICD-10-CM | POA: Diagnosis not present

## 2019-04-08 DIAGNOSIS — R7301 Impaired fasting glucose: Secondary | ICD-10-CM | POA: Diagnosis not present

## 2019-04-08 DIAGNOSIS — N183 Chronic kidney disease, stage 3 unspecified: Secondary | ICD-10-CM | POA: Diagnosis not present

## 2019-04-08 DIAGNOSIS — M47812 Spondylosis without myelopathy or radiculopathy, cervical region: Secondary | ICD-10-CM | POA: Diagnosis not present

## 2019-04-08 DIAGNOSIS — G3183 Dementia with Lewy bodies: Secondary | ICD-10-CM | POA: Diagnosis not present

## 2019-04-08 DIAGNOSIS — K219 Gastro-esophageal reflux disease without esophagitis: Secondary | ICD-10-CM | POA: Diagnosis not present

## 2019-04-08 DIAGNOSIS — I4821 Permanent atrial fibrillation: Secondary | ICD-10-CM | POA: Diagnosis not present

## 2019-04-08 DIAGNOSIS — D62 Acute posthemorrhagic anemia: Secondary | ICD-10-CM | POA: Diagnosis not present

## 2019-04-08 DIAGNOSIS — Z8673 Personal history of transient ischemic attack (TIA), and cerebral infarction without residual deficits: Secondary | ICD-10-CM | POA: Diagnosis not present

## 2019-04-08 DIAGNOSIS — I50810 Right heart failure, unspecified: Secondary | ICD-10-CM | POA: Diagnosis not present

## 2019-04-08 DIAGNOSIS — S02401D Maxillary fracture, unspecified, subsequent encounter for fracture with routine healing: Secondary | ICD-10-CM | POA: Diagnosis not present

## 2019-04-08 DIAGNOSIS — I272 Pulmonary hypertension, unspecified: Secondary | ICD-10-CM | POA: Diagnosis not present

## 2019-04-08 DIAGNOSIS — G2581 Restless legs syndrome: Secondary | ICD-10-CM | POA: Diagnosis not present

## 2019-04-08 DIAGNOSIS — N400 Enlarged prostate without lower urinary tract symptoms: Secondary | ICD-10-CM | POA: Diagnosis not present

## 2019-04-08 DIAGNOSIS — I130 Hypertensive heart and chronic kidney disease with heart failure and stage 1 through stage 4 chronic kidney disease, or unspecified chronic kidney disease: Secondary | ICD-10-CM | POA: Diagnosis not present

## 2019-04-08 DIAGNOSIS — Z7901 Long term (current) use of anticoagulants: Secondary | ICD-10-CM | POA: Diagnosis not present

## 2019-04-08 DIAGNOSIS — M19042 Primary osteoarthritis, left hand: Secondary | ICD-10-CM | POA: Diagnosis not present

## 2019-04-08 DIAGNOSIS — F0281 Dementia in other diseases classified elsewhere with behavioral disturbance: Secondary | ICD-10-CM | POA: Diagnosis not present

## 2019-04-08 DIAGNOSIS — G4733 Obstructive sleep apnea (adult) (pediatric): Secondary | ICD-10-CM | POA: Diagnosis not present

## 2019-04-11 DIAGNOSIS — G4733 Obstructive sleep apnea (adult) (pediatric): Secondary | ICD-10-CM | POA: Diagnosis not present

## 2019-04-15 DIAGNOSIS — I130 Hypertensive heart and chronic kidney disease with heart failure and stage 1 through stage 4 chronic kidney disease, or unspecified chronic kidney disease: Secondary | ICD-10-CM | POA: Diagnosis not present

## 2019-04-15 DIAGNOSIS — I50810 Right heart failure, unspecified: Secondary | ICD-10-CM | POA: Diagnosis not present

## 2019-04-15 DIAGNOSIS — S02401D Maxillary fracture, unspecified, subsequent encounter for fracture with routine healing: Secondary | ICD-10-CM | POA: Diagnosis not present

## 2019-04-15 DIAGNOSIS — I272 Pulmonary hypertension, unspecified: Secondary | ICD-10-CM | POA: Diagnosis not present

## 2019-04-15 DIAGNOSIS — E785 Hyperlipidemia, unspecified: Secondary | ICD-10-CM | POA: Diagnosis not present

## 2019-04-15 DIAGNOSIS — G4700 Insomnia, unspecified: Secondary | ICD-10-CM | POA: Diagnosis not present

## 2019-04-15 DIAGNOSIS — R7301 Impaired fasting glucose: Secondary | ICD-10-CM | POA: Diagnosis not present

## 2019-04-15 DIAGNOSIS — G309 Alzheimer's disease, unspecified: Secondary | ICD-10-CM | POA: Diagnosis not present

## 2019-04-15 DIAGNOSIS — S022XXD Fracture of nasal bones, subsequent encounter for fracture with routine healing: Secondary | ICD-10-CM | POA: Diagnosis not present

## 2019-04-15 DIAGNOSIS — G4733 Obstructive sleep apnea (adult) (pediatric): Secondary | ICD-10-CM | POA: Diagnosis not present

## 2019-04-15 DIAGNOSIS — Z7901 Long term (current) use of anticoagulants: Secondary | ICD-10-CM | POA: Diagnosis not present

## 2019-04-15 DIAGNOSIS — G3183 Dementia with Lewy bodies: Secondary | ICD-10-CM | POA: Diagnosis not present

## 2019-04-15 DIAGNOSIS — N400 Enlarged prostate without lower urinary tract symptoms: Secondary | ICD-10-CM | POA: Diagnosis not present

## 2019-04-15 DIAGNOSIS — S0285XD Fracture of orbit, unspecified, subsequent encounter for fracture with routine healing: Secondary | ICD-10-CM | POA: Diagnosis not present

## 2019-04-15 DIAGNOSIS — M47812 Spondylosis without myelopathy or radiculopathy, cervical region: Secondary | ICD-10-CM | POA: Diagnosis not present

## 2019-04-15 DIAGNOSIS — I4821 Permanent atrial fibrillation: Secondary | ICD-10-CM | POA: Diagnosis not present

## 2019-04-15 DIAGNOSIS — G2581 Restless legs syndrome: Secondary | ICD-10-CM | POA: Diagnosis not present

## 2019-04-15 DIAGNOSIS — F0281 Dementia in other diseases classified elsewhere with behavioral disturbance: Secondary | ICD-10-CM | POA: Diagnosis not present

## 2019-04-15 DIAGNOSIS — Z8673 Personal history of transient ischemic attack (TIA), and cerebral infarction without residual deficits: Secondary | ICD-10-CM | POA: Diagnosis not present

## 2019-04-15 DIAGNOSIS — I495 Sick sinus syndrome: Secondary | ICD-10-CM | POA: Diagnosis not present

## 2019-04-15 DIAGNOSIS — F329 Major depressive disorder, single episode, unspecified: Secondary | ICD-10-CM | POA: Diagnosis not present

## 2019-04-15 DIAGNOSIS — K219 Gastro-esophageal reflux disease without esophagitis: Secondary | ICD-10-CM | POA: Diagnosis not present

## 2019-04-15 DIAGNOSIS — D62 Acute posthemorrhagic anemia: Secondary | ICD-10-CM | POA: Diagnosis not present

## 2019-04-15 DIAGNOSIS — M19042 Primary osteoarthritis, left hand: Secondary | ICD-10-CM | POA: Diagnosis not present

## 2019-04-15 DIAGNOSIS — N183 Chronic kidney disease, stage 3 unspecified: Secondary | ICD-10-CM | POA: Diagnosis not present

## 2019-04-22 DIAGNOSIS — S022XXD Fracture of nasal bones, subsequent encounter for fracture with routine healing: Secondary | ICD-10-CM | POA: Diagnosis not present

## 2019-04-22 DIAGNOSIS — I130 Hypertensive heart and chronic kidney disease with heart failure and stage 1 through stage 4 chronic kidney disease, or unspecified chronic kidney disease: Secondary | ICD-10-CM | POA: Diagnosis not present

## 2019-04-22 DIAGNOSIS — I4821 Permanent atrial fibrillation: Secondary | ICD-10-CM | POA: Diagnosis not present

## 2019-04-22 DIAGNOSIS — G2581 Restless legs syndrome: Secondary | ICD-10-CM | POA: Diagnosis not present

## 2019-04-22 DIAGNOSIS — M19042 Primary osteoarthritis, left hand: Secondary | ICD-10-CM | POA: Diagnosis not present

## 2019-04-22 DIAGNOSIS — G4700 Insomnia, unspecified: Secondary | ICD-10-CM | POA: Diagnosis not present

## 2019-04-22 DIAGNOSIS — G3183 Dementia with Lewy bodies: Secondary | ICD-10-CM | POA: Diagnosis not present

## 2019-04-22 DIAGNOSIS — I50810 Right heart failure, unspecified: Secondary | ICD-10-CM | POA: Diagnosis not present

## 2019-04-22 DIAGNOSIS — F329 Major depressive disorder, single episode, unspecified: Secondary | ICD-10-CM | POA: Diagnosis not present

## 2019-04-22 DIAGNOSIS — S02401D Maxillary fracture, unspecified, subsequent encounter for fracture with routine healing: Secondary | ICD-10-CM | POA: Diagnosis not present

## 2019-04-22 DIAGNOSIS — S0285XD Fracture of orbit, unspecified, subsequent encounter for fracture with routine healing: Secondary | ICD-10-CM | POA: Diagnosis not present

## 2019-04-22 DIAGNOSIS — E785 Hyperlipidemia, unspecified: Secondary | ICD-10-CM | POA: Diagnosis not present

## 2019-04-22 DIAGNOSIS — D62 Acute posthemorrhagic anemia: Secondary | ICD-10-CM | POA: Diagnosis not present

## 2019-04-22 DIAGNOSIS — G4733 Obstructive sleep apnea (adult) (pediatric): Secondary | ICD-10-CM | POA: Diagnosis not present

## 2019-04-22 DIAGNOSIS — N183 Chronic kidney disease, stage 3 unspecified: Secondary | ICD-10-CM | POA: Diagnosis not present

## 2019-04-22 DIAGNOSIS — G309 Alzheimer's disease, unspecified: Secondary | ICD-10-CM | POA: Diagnosis not present

## 2019-04-22 DIAGNOSIS — R7301 Impaired fasting glucose: Secondary | ICD-10-CM | POA: Diagnosis not present

## 2019-04-22 DIAGNOSIS — Z8673 Personal history of transient ischemic attack (TIA), and cerebral infarction without residual deficits: Secondary | ICD-10-CM | POA: Diagnosis not present

## 2019-04-22 DIAGNOSIS — I495 Sick sinus syndrome: Secondary | ICD-10-CM | POA: Diagnosis not present

## 2019-04-22 DIAGNOSIS — Z7901 Long term (current) use of anticoagulants: Secondary | ICD-10-CM | POA: Diagnosis not present

## 2019-04-22 DIAGNOSIS — F0281 Dementia in other diseases classified elsewhere with behavioral disturbance: Secondary | ICD-10-CM | POA: Diagnosis not present

## 2019-04-22 DIAGNOSIS — I272 Pulmonary hypertension, unspecified: Secondary | ICD-10-CM | POA: Diagnosis not present

## 2019-04-22 DIAGNOSIS — M47812 Spondylosis without myelopathy or radiculopathy, cervical region: Secondary | ICD-10-CM | POA: Diagnosis not present

## 2019-04-22 DIAGNOSIS — K219 Gastro-esophageal reflux disease without esophagitis: Secondary | ICD-10-CM | POA: Diagnosis not present

## 2019-04-22 DIAGNOSIS — N400 Enlarged prostate without lower urinary tract symptoms: Secondary | ICD-10-CM | POA: Diagnosis not present

## 2019-04-30 DIAGNOSIS — E785 Hyperlipidemia, unspecified: Secondary | ICD-10-CM | POA: Diagnosis not present

## 2019-04-30 DIAGNOSIS — G3183 Dementia with Lewy bodies: Secondary | ICD-10-CM | POA: Diagnosis not present

## 2019-04-30 DIAGNOSIS — F0281 Dementia in other diseases classified elsewhere with behavioral disturbance: Secondary | ICD-10-CM | POA: Diagnosis not present

## 2019-04-30 DIAGNOSIS — D62 Acute posthemorrhagic anemia: Secondary | ICD-10-CM | POA: Diagnosis not present

## 2019-04-30 DIAGNOSIS — I272 Pulmonary hypertension, unspecified: Secondary | ICD-10-CM | POA: Diagnosis not present

## 2019-04-30 DIAGNOSIS — Z7901 Long term (current) use of anticoagulants: Secondary | ICD-10-CM | POA: Diagnosis not present

## 2019-04-30 DIAGNOSIS — G4733 Obstructive sleep apnea (adult) (pediatric): Secondary | ICD-10-CM | POA: Diagnosis not present

## 2019-04-30 DIAGNOSIS — K219 Gastro-esophageal reflux disease without esophagitis: Secondary | ICD-10-CM | POA: Diagnosis not present

## 2019-04-30 DIAGNOSIS — G4700 Insomnia, unspecified: Secondary | ICD-10-CM | POA: Diagnosis not present

## 2019-04-30 DIAGNOSIS — M47812 Spondylosis without myelopathy or radiculopathy, cervical region: Secondary | ICD-10-CM | POA: Diagnosis not present

## 2019-04-30 DIAGNOSIS — S02401D Maxillary fracture, unspecified, subsequent encounter for fracture with routine healing: Secondary | ICD-10-CM | POA: Diagnosis not present

## 2019-04-30 DIAGNOSIS — M19042 Primary osteoarthritis, left hand: Secondary | ICD-10-CM | POA: Diagnosis not present

## 2019-04-30 DIAGNOSIS — S022XXD Fracture of nasal bones, subsequent encounter for fracture with routine healing: Secondary | ICD-10-CM | POA: Diagnosis not present

## 2019-04-30 DIAGNOSIS — I130 Hypertensive heart and chronic kidney disease with heart failure and stage 1 through stage 4 chronic kidney disease, or unspecified chronic kidney disease: Secondary | ICD-10-CM | POA: Diagnosis not present

## 2019-04-30 DIAGNOSIS — I50810 Right heart failure, unspecified: Secondary | ICD-10-CM | POA: Diagnosis not present

## 2019-04-30 DIAGNOSIS — Z8673 Personal history of transient ischemic attack (TIA), and cerebral infarction without residual deficits: Secondary | ICD-10-CM | POA: Diagnosis not present

## 2019-04-30 DIAGNOSIS — G309 Alzheimer's disease, unspecified: Secondary | ICD-10-CM | POA: Diagnosis not present

## 2019-04-30 DIAGNOSIS — S0285XD Fracture of orbit, unspecified, subsequent encounter for fracture with routine healing: Secondary | ICD-10-CM | POA: Diagnosis not present

## 2019-04-30 DIAGNOSIS — N400 Enlarged prostate without lower urinary tract symptoms: Secondary | ICD-10-CM | POA: Diagnosis not present

## 2019-04-30 DIAGNOSIS — G2581 Restless legs syndrome: Secondary | ICD-10-CM | POA: Diagnosis not present

## 2019-04-30 DIAGNOSIS — N183 Chronic kidney disease, stage 3 unspecified: Secondary | ICD-10-CM | POA: Diagnosis not present

## 2019-04-30 DIAGNOSIS — I495 Sick sinus syndrome: Secondary | ICD-10-CM | POA: Diagnosis not present

## 2019-04-30 DIAGNOSIS — R7301 Impaired fasting glucose: Secondary | ICD-10-CM | POA: Diagnosis not present

## 2019-04-30 DIAGNOSIS — F329 Major depressive disorder, single episode, unspecified: Secondary | ICD-10-CM | POA: Diagnosis not present

## 2019-04-30 DIAGNOSIS — I4821 Permanent atrial fibrillation: Secondary | ICD-10-CM | POA: Diagnosis not present

## 2019-05-02 ENCOUNTER — Encounter: Payer: Self-pay | Admitting: Internal Medicine

## 2019-05-02 ENCOUNTER — Ambulatory Visit (INDEPENDENT_AMBULATORY_CARE_PROVIDER_SITE_OTHER): Payer: PPO | Admitting: Internal Medicine

## 2019-05-02 ENCOUNTER — Other Ambulatory Visit: Payer: Self-pay

## 2019-05-02 DIAGNOSIS — F0391 Unspecified dementia with behavioral disturbance: Secondary | ICD-10-CM | POA: Diagnosis not present

## 2019-05-02 DIAGNOSIS — G4733 Obstructive sleep apnea (adult) (pediatric): Secondary | ICD-10-CM | POA: Diagnosis not present

## 2019-05-02 DIAGNOSIS — G301 Alzheimer's disease with late onset: Secondary | ICD-10-CM | POA: Diagnosis not present

## 2019-05-02 DIAGNOSIS — N400 Enlarged prostate without lower urinary tract symptoms: Secondary | ICD-10-CM | POA: Diagnosis not present

## 2019-05-02 DIAGNOSIS — I4891 Unspecified atrial fibrillation: Secondary | ICD-10-CM | POA: Diagnosis not present

## 2019-05-02 DIAGNOSIS — G2581 Restless legs syndrome: Secondary | ICD-10-CM | POA: Diagnosis not present

## 2019-05-02 DIAGNOSIS — N183 Chronic kidney disease, stage 3 unspecified: Secondary | ICD-10-CM | POA: Diagnosis not present

## 2019-05-02 DIAGNOSIS — I272 Pulmonary hypertension, unspecified: Secondary | ICD-10-CM | POA: Diagnosis not present

## 2019-05-02 DIAGNOSIS — J40 Bronchitis, not specified as acute or chronic: Secondary | ICD-10-CM | POA: Diagnosis not present

## 2019-05-02 DIAGNOSIS — E7800 Pure hypercholesterolemia, unspecified: Secondary | ICD-10-CM | POA: Diagnosis not present

## 2019-05-02 DIAGNOSIS — M47816 Spondylosis without myelopathy or radiculopathy, lumbar region: Secondary | ICD-10-CM | POA: Diagnosis not present

## 2019-05-02 DIAGNOSIS — M152 Bouchard's nodes (with arthropathy): Secondary | ICD-10-CM | POA: Diagnosis not present

## 2019-05-02 NOTE — Assessment & Plan Note (Signed)
Benefits from CPAP with control of OSA. Aware of Central Apnea. Not going to pursue BIPAP titration at this time. CSA likely reflects cerebral blood flow aspect of his cardiovascular disease. Plan - continue auto 5-15

## 2019-05-02 NOTE — Progress Notes (Signed)
Subjective:    Patient ID: [name], male    DOB: 08/25/1929, 85 y.o.   MRN: [id number]  HPI  male never smoker followed for OSA, allergic rhinitis, bronchitis, multiple granulomas,  Cheyne Stokes, Restless legs, complicated by CM/ CAD/  A. Fib/ bradycardia tachycardia/pacer,  GERD. History right nephrectomy for cancer.  NPSG 03/09/06 AHI 39.7 --------------------------------------------------------------------------------------------------------  04/30/2018- 85 yo male never smoker followed for OSA, allergic rhinitis, COPD/bronchitis,multiple granulomas history Cheyne Stokes, Restless legs, complicated by CM/ CAD /A. Fib/ bradycardia tachycardia/pacer, GERD. History right nephrectomy for cancer. Hospital in June after falling and breaking hip requiring left hip replacement. -----OSA: DME [employer]. Pt wears CPAP nightly. DL attached.  Download 98% compliance AHI 6.7/hour CPAP 9 CWP/[employer] >> today replace old machine changing to auto 5-15 No respiratory problems with hip replacement.  Says his breathing is "fine" without routine cough or wheeze. Sleeping comfortably with routine use of CPAP which prevents daytime sleepiness and snoring.  The machine is old and this is an opportunity to change to AutoPap. CXR 11/29/2017- Stable cardiomegaly. The hila, mediastinum, right-sided pacemaker, and pleura are normal. No active cardiopulmonary disease.  05/02/2019- Virtual Visit via Telephone Note  I connected with [name] on 05/02/19 at  1:30 PM EDT by telephone and verified that I am speaking with the correct person using two identifiers.  Location: Patient: H Provider: O   I discussed the limitations, risks, security and privacy concerns of performing an evaluation and management service by telephone and the availability of in person appointments. I also discussed with the patient that there may be a patient responsible charge related to this service. The patient expressed  understanding and agreed to proceed.   History of Present Illness: 85 yo male never smoker followed for OSA, allergic rhinitis, COPD/bronchitis,multiple granulomas history Cheyne Stokes, Restless legs, complicated by CM/ CAD /A. Fib/ bradycardia tachycardia/pacer, GERD. History right nephrectomy for cancer. CPAP auto 5-15/ [employer] Download compliance 90%, AHI 12.8 ( all Central Apneas)  Atrovent nasal, Seroquel Wife also on the call. They say he sleeps well- about 12 hours at night and dozes in chair during the day. They are satisfied that this reflects his dementia. He doesn't like coffee/ tea and not interested in caffeine. They don't want my intervention on this. Discussed Central Apneas. They don't have interest or feel need to do more- which would include consideration of BiPAP trial. Had flu vax Observations/Objective:   Assessment and Plan: OSA-- controlled and compliant Plan continue CPAP  Central Sleep apnea- unclear if this is contributing any to his somnolence. He is not interested in stimulants and satisfied to be left alone on Le[place] Rs. Dementia- cared for by his wife an Neurology.   Follow Up Instructions: 1 year  I discussed the assessment and treatment plan with the patient. The patient was provided an opportunity to ask questions and all were answered. The patient agreed with the plan and demonstrated an understanding of the instructions.   The patient was advised to call back or seek an in-person evaluation if the symptoms worsen or if the condition fails to improve as anticipated.  I provided 18 minutes of non-face-to-face time during this encounter.   Baird Lyons, MD    ROS-see HPI  + = positive Constitutional:   No-   weight loss, night sweats, fevers, chills, fatigue, lassitude. HEENT:   No-  headaches, difficulty swallowing, tooth/dental problems, sore throat,       No-  sneezing, itching, ear ache, nasal congestion,  post nasal drip,  CV:  No-   chest  pain, orthopnea, PND, swelling in lower extremities, anasarca, +dizziness, palpitations Resp: + shortness of breath with exertion or at rest.              No-   productive cough,  No non-productive cough,  No- coughing up of blood.              No-   change in color of mucus.  No- wheezing.   Skin: No-   rash or lesions. GI:  No-   heartburn, indigestion, abdominal pain, nausea, vomiting, GU:  MS:  No-   joint pain or swelling.   Neuro-     nothing unusual Psych:  No- change in mood or affect. No depression or anxiety.  No memory loss.  OBJ- Physical Exam General- Alert, Oriented, Affect-appropriate, Distress- none acute. +thin elderly Skin- rash-none, lesions- none, excoriation- none Lymphadenopathy- none Head- atraumatic            Eyes- Gross vision intact, PERRLA, conjunctivae and secretions clear            Ears- Hearing, canals-normal            Nose- Clear, no-Septal dev, mucus, polyps, erosion, perforation             Throat- Mallampati II , mucosa clear , drainage- none, tonsils- atrophic Neck- flexible , trachea midline, no stridor , thyroid nl, carotid no bruit Chest - symmetrical excursion , unlabored           Heart/CV- RRR , 2-3/6 SEM AS murmur , no gallop  , no rub, nl s1 s2                           - JVD- none , edema-+ 2/ support hose, stasis changes- , varices- none           Lung-clear, no rales, unlabored, wheeze- none, cough- none , dullness-none, rub- none           Chest wall- +R pacemaker Abd-  Br/ Gen/ Rectal- Not done, not indicated Extrem- cyanosis- none, clubbing, none, atrophy- none, strength- nl Neuro- grossly intact to observation  Assessment & Plan:

## 2019-05-02 NOTE — Assessment & Plan Note (Signed)
Wife stepped up and was helpful during phone visit. He was deferring to her to answer questions.

## 2019-05-02 NOTE — Assessment & Plan Note (Signed)
Not describing significant sleep disturbance from limb movement now.

## 2019-05-02 NOTE — Patient Instructions (Signed)
We can continue CPAP auto 5-15, mask of choice, humidifier, supplies, AirView/ card  Please call if we can help 

## 2019-05-03 DIAGNOSIS — K219 Gastro-esophageal reflux disease without esophagitis: Secondary | ICD-10-CM | POA: Diagnosis not present

## 2019-05-03 DIAGNOSIS — S72002D Fracture of unspecified part of neck of left femur, subsequent encounter for closed fracture with routine healing: Secondary | ICD-10-CM | POA: Diagnosis not present

## 2019-05-03 DIAGNOSIS — G4733 Obstructive sleep apnea (adult) (pediatric): Secondary | ICD-10-CM | POA: Diagnosis not present

## 2019-05-03 DIAGNOSIS — Z96642 Presence of left artificial hip joint: Secondary | ICD-10-CM | POA: Diagnosis not present

## 2019-05-06 ENCOUNTER — Telehealth: Payer: Self-pay | Admitting: *Deleted

## 2019-05-06 ENCOUNTER — Ambulatory Visit: Payer: Self-pay | Admitting: Neurology

## 2019-05-06 IMAGING — RF DG C-ARM 61-120 MIN
1 series · 3 of 3 positions shown · non-contrast
Comparison: Left hip x-rays dated November 29, 2017.

CLINICAL DATA: Left hip arthroplasty.

EXAM:
OPERATIVE LEFT HIP (WITH PELVIS IF PERFORMED) 3 VIEWS
TECHNIQUE: Fluoroscopic spot image(s) were submitted for interpretation
post-operatively.

[Series 1: run · 3 of 3 slices shown]
[im 1/3]
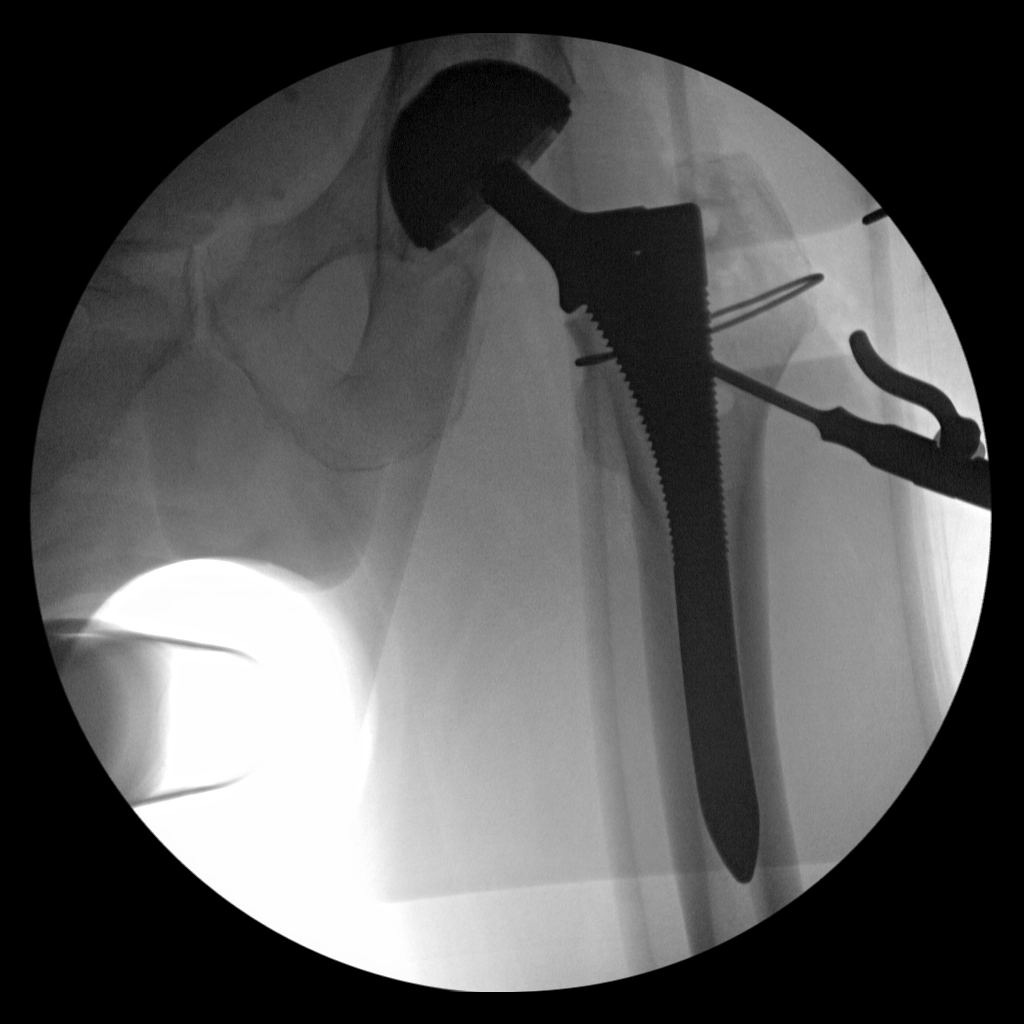
[im 2/3]
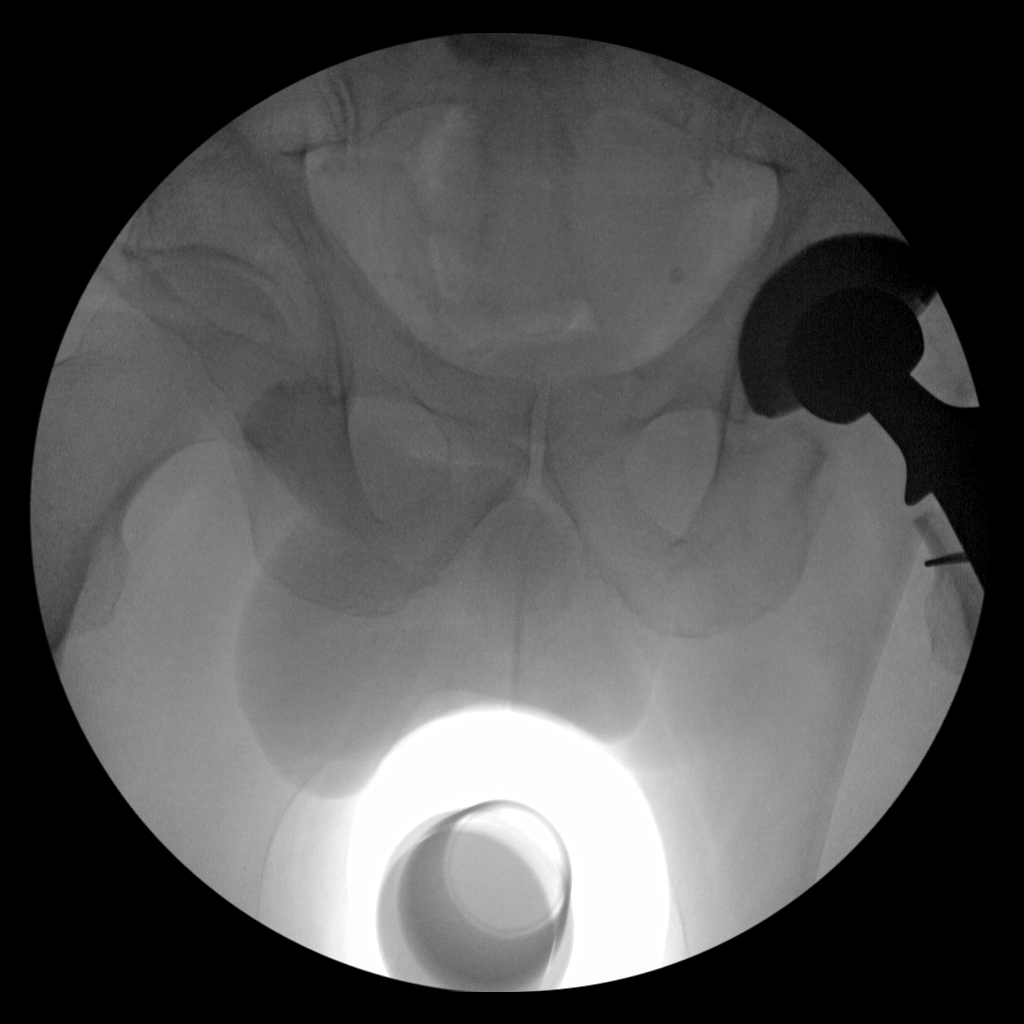
[im 3/3]
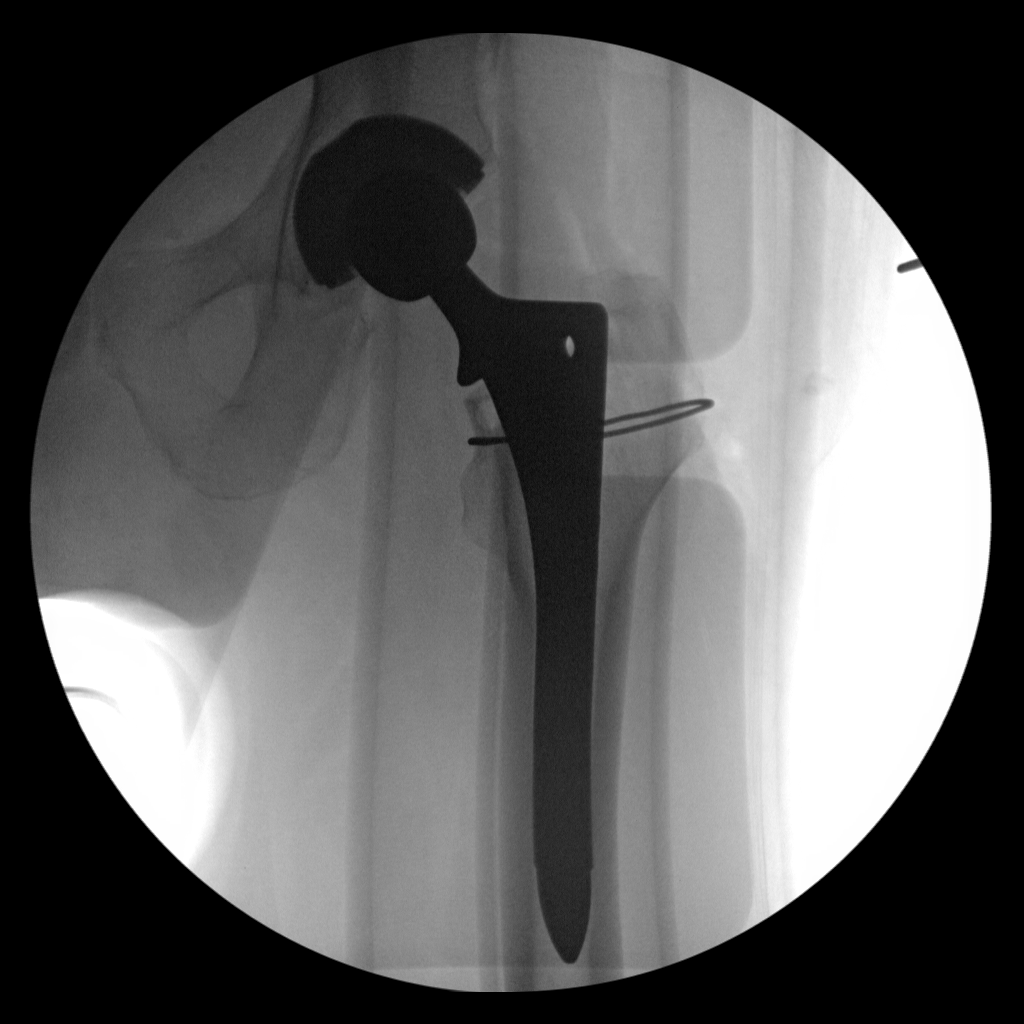

[3 of 3 positions shown; findings below may reference images not displayed]

FINDINGS: Intraoperative x-rays demonstrating interval left total hip
arthroplasty with single cerclage wire around the greater
trochanter. Components are well aligned.
IMPRESSION: Interval left total hip arthroplasty.

FLUOROSCOPY TIME:  28 seconds.

C-arm fluoroscopic images were obtained intraoperatively and
submitted for post operative interpretation.

## 2019-05-06 IMAGING — DX DG PORTABLE PELVIS
1 series · 1 of 1 positions shown · non-contrast
Comparison: Left hip x-rays dated November 29, 2017.

CLINICAL DATA: Left total hip replacement.

EXAM:
PORTABLE PELVIS 1-2 VIEWS

[pelvis ap]
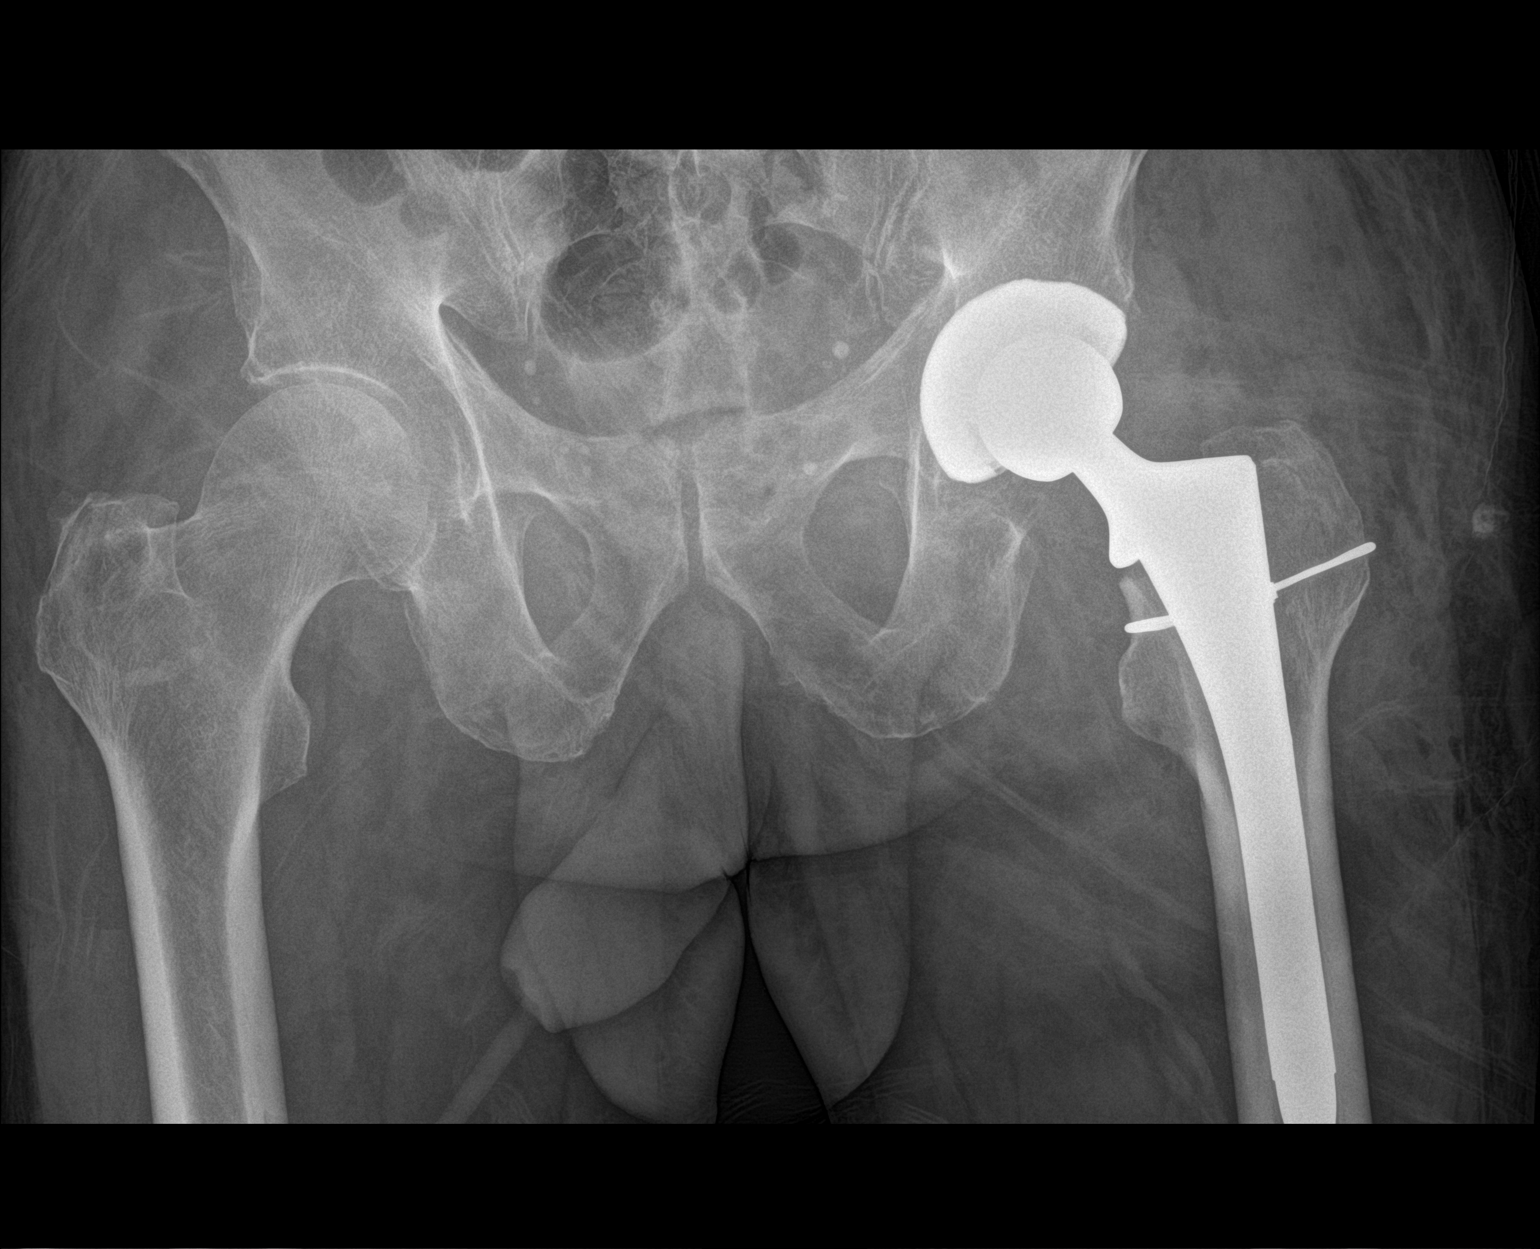

[1 of 1 positions shown; findings below may reference images not displayed]

FINDINGS: The left hip demonstrates a total arthroplasty without evidence of
hardware failure or complication. Single cerclage wire around the
greater trochanter. There is expected intra-articular air. There is
no fracture or dislocation. The alignment is anatomic. Post-surgical
changes noted in the surrounding soft tissues. The right hip is
unremarkable.
IMPRESSION: 1. Interval left total hip arthroplasty without evidence of acute
postoperative complication.

## 2019-05-06 NOTE — Telephone Encounter (Signed)
Dr. Jaynee Eagles [facility] c/[facility] home health aide 1wk4 and PT services [phone] per [facility] POC. Orders faxed back to [facility]. Received a receipt of confirmation.

## 2019-05-06 NOTE — Telephone Encounter (Signed)
Per Dr Jaynee Eagles, ok to cancel appt if stable. I called the pt's wife and spoke with her. She states pt is stable. His hospital bed arrived last Friday. He hasn't fallen out of the bed since then. She stated the palliative care RN is visiting pt 1-3 times per month and she stated she was told when pt is ready they will transition to hospice. She will be in touch with our office as needed regarding any further appts. She verbalized appreciation for the call.   Today's appt canceled.

## 2019-05-10 DIAGNOSIS — I130 Hypertensive heart and chronic kidney disease with heart failure and stage 1 through stage 4 chronic kidney disease, or unspecified chronic kidney disease: Secondary | ICD-10-CM | POA: Diagnosis not present

## 2019-05-10 DIAGNOSIS — M47812 Spondylosis without myelopathy or radiculopathy, cervical region: Secondary | ICD-10-CM | POA: Diagnosis not present

## 2019-05-10 DIAGNOSIS — G309 Alzheimer's disease, unspecified: Secondary | ICD-10-CM | POA: Diagnosis not present

## 2019-05-10 DIAGNOSIS — Z8673 Personal history of transient ischemic attack (TIA), and cerebral infarction without residual deficits: Secondary | ICD-10-CM | POA: Diagnosis not present

## 2019-05-10 DIAGNOSIS — F329 Major depressive disorder, single episode, unspecified: Secondary | ICD-10-CM | POA: Diagnosis not present

## 2019-05-10 DIAGNOSIS — I4821 Permanent atrial fibrillation: Secondary | ICD-10-CM | POA: Diagnosis not present

## 2019-05-10 DIAGNOSIS — M19042 Primary osteoarthritis, left hand: Secondary | ICD-10-CM | POA: Diagnosis not present

## 2019-05-10 DIAGNOSIS — G4700 Insomnia, unspecified: Secondary | ICD-10-CM | POA: Diagnosis not present

## 2019-05-10 DIAGNOSIS — N400 Enlarged prostate without lower urinary tract symptoms: Secondary | ICD-10-CM | POA: Diagnosis not present

## 2019-05-10 DIAGNOSIS — I50810 Right heart failure, unspecified: Secondary | ICD-10-CM | POA: Diagnosis not present

## 2019-05-10 DIAGNOSIS — K219 Gastro-esophageal reflux disease without esophagitis: Secondary | ICD-10-CM | POA: Diagnosis not present

## 2019-05-10 DIAGNOSIS — E785 Hyperlipidemia, unspecified: Secondary | ICD-10-CM | POA: Diagnosis not present

## 2019-05-10 DIAGNOSIS — I272 Pulmonary hypertension, unspecified: Secondary | ICD-10-CM | POA: Diagnosis not present

## 2019-05-10 DIAGNOSIS — F0281 Dementia in other diseases classified elsewhere with behavioral disturbance: Secondary | ICD-10-CM | POA: Diagnosis not present

## 2019-05-10 DIAGNOSIS — G2581 Restless legs syndrome: Secondary | ICD-10-CM | POA: Diagnosis not present

## 2019-05-10 DIAGNOSIS — G3183 Dementia with Lewy bodies: Secondary | ICD-10-CM | POA: Diagnosis not present

## 2019-05-10 DIAGNOSIS — N183 Chronic kidney disease, stage 3 unspecified: Secondary | ICD-10-CM | POA: Diagnosis not present

## 2019-05-10 DIAGNOSIS — S02401D Maxillary fracture, unspecified, subsequent encounter for fracture with routine healing: Secondary | ICD-10-CM | POA: Diagnosis not present

## 2019-05-10 DIAGNOSIS — D62 Acute posthemorrhagic anemia: Secondary | ICD-10-CM | POA: Diagnosis not present

## 2019-05-10 DIAGNOSIS — G4733 Obstructive sleep apnea (adult) (pediatric): Secondary | ICD-10-CM | POA: Diagnosis not present

## 2019-05-10 DIAGNOSIS — Z7901 Long term (current) use of anticoagulants: Secondary | ICD-10-CM | POA: Diagnosis not present

## 2019-05-10 DIAGNOSIS — S0285XD Fracture of orbit, unspecified, subsequent encounter for fracture with routine healing: Secondary | ICD-10-CM | POA: Diagnosis not present

## 2019-05-10 DIAGNOSIS — R7301 Impaired fasting glucose: Secondary | ICD-10-CM | POA: Diagnosis not present

## 2019-05-10 DIAGNOSIS — S022XXD Fracture of nasal bones, subsequent encounter for fracture with routine healing: Secondary | ICD-10-CM | POA: Diagnosis not present

## 2019-05-10 DIAGNOSIS — I495 Sick sinus syndrome: Secondary | ICD-10-CM | POA: Diagnosis not present

## 2019-05-12 DIAGNOSIS — G4733 Obstructive sleep apnea (adult) (pediatric): Secondary | ICD-10-CM | POA: Diagnosis not present

## 2019-05-29 DIAGNOSIS — N183 Chronic kidney disease, stage 3 unspecified: Secondary | ICD-10-CM | POA: Diagnosis not present

## 2019-05-29 DIAGNOSIS — I50810 Right heart failure, unspecified: Secondary | ICD-10-CM | POA: Diagnosis not present

## 2019-05-29 DIAGNOSIS — F329 Major depressive disorder, single episode, unspecified: Secondary | ICD-10-CM | POA: Diagnosis not present

## 2019-05-29 DIAGNOSIS — S022XXD Fracture of nasal bones, subsequent encounter for fracture with routine healing: Secondary | ICD-10-CM | POA: Diagnosis not present

## 2019-05-29 DIAGNOSIS — G4733 Obstructive sleep apnea (adult) (pediatric): Secondary | ICD-10-CM | POA: Diagnosis not present

## 2019-05-29 DIAGNOSIS — Z8673 Personal history of transient ischemic attack (TIA), and cerebral infarction without residual deficits: Secondary | ICD-10-CM | POA: Diagnosis not present

## 2019-05-29 DIAGNOSIS — Z7901 Long term (current) use of anticoagulants: Secondary | ICD-10-CM | POA: Diagnosis not present

## 2019-05-29 DIAGNOSIS — D62 Acute posthemorrhagic anemia: Secondary | ICD-10-CM | POA: Diagnosis not present

## 2019-05-29 DIAGNOSIS — I130 Hypertensive heart and chronic kidney disease with heart failure and stage 1 through stage 4 chronic kidney disease, or unspecified chronic kidney disease: Secondary | ICD-10-CM | POA: Diagnosis not present

## 2019-05-29 DIAGNOSIS — M19042 Primary osteoarthritis, left hand: Secondary | ICD-10-CM | POA: Diagnosis not present

## 2019-05-29 DIAGNOSIS — K219 Gastro-esophageal reflux disease without esophagitis: Secondary | ICD-10-CM | POA: Diagnosis not present

## 2019-05-29 DIAGNOSIS — E785 Hyperlipidemia, unspecified: Secondary | ICD-10-CM | POA: Diagnosis not present

## 2019-05-29 DIAGNOSIS — S0285XD Fracture of orbit, unspecified, subsequent encounter for fracture with routine healing: Secondary | ICD-10-CM | POA: Diagnosis not present

## 2019-05-29 DIAGNOSIS — R7301 Impaired fasting glucose: Secondary | ICD-10-CM | POA: Diagnosis not present

## 2019-05-29 DIAGNOSIS — S02401D Maxillary fracture, unspecified, subsequent encounter for fracture with routine healing: Secondary | ICD-10-CM | POA: Diagnosis not present

## 2019-05-29 DIAGNOSIS — F0281 Dementia in other diseases classified elsewhere with behavioral disturbance: Secondary | ICD-10-CM | POA: Diagnosis not present

## 2019-05-29 DIAGNOSIS — I272 Pulmonary hypertension, unspecified: Secondary | ICD-10-CM | POA: Diagnosis not present

## 2019-05-29 DIAGNOSIS — N400 Enlarged prostate without lower urinary tract symptoms: Secondary | ICD-10-CM | POA: Diagnosis not present

## 2019-05-29 DIAGNOSIS — G309 Alzheimer's disease, unspecified: Secondary | ICD-10-CM | POA: Diagnosis not present

## 2019-05-29 DIAGNOSIS — G4700 Insomnia, unspecified: Secondary | ICD-10-CM | POA: Diagnosis not present

## 2019-05-29 DIAGNOSIS — I4821 Permanent atrial fibrillation: Secondary | ICD-10-CM | POA: Diagnosis not present

## 2019-05-29 DIAGNOSIS — G3183 Dementia with Lewy bodies: Secondary | ICD-10-CM | POA: Diagnosis not present

## 2019-05-29 DIAGNOSIS — I495 Sick sinus syndrome: Secondary | ICD-10-CM | POA: Diagnosis not present

## 2019-05-29 DIAGNOSIS — M47812 Spondylosis without myelopathy or radiculopathy, cervical region: Secondary | ICD-10-CM | POA: Diagnosis not present

## 2019-05-29 DIAGNOSIS — G2581 Restless legs syndrome: Secondary | ICD-10-CM | POA: Diagnosis not present

## 2019-05-31 DIAGNOSIS — N400 Enlarged prostate without lower urinary tract symptoms: Secondary | ICD-10-CM | POA: Diagnosis not present

## 2019-05-31 DIAGNOSIS — G309 Alzheimer's disease, unspecified: Secondary | ICD-10-CM | POA: Diagnosis not present

## 2019-05-31 DIAGNOSIS — G2581 Restless legs syndrome: Secondary | ICD-10-CM | POA: Diagnosis not present

## 2019-05-31 DIAGNOSIS — N183 Chronic kidney disease, stage 3 unspecified: Secondary | ICD-10-CM | POA: Diagnosis not present

## 2019-05-31 DIAGNOSIS — F0281 Dementia in other diseases classified elsewhere with behavioral disturbance: Secondary | ICD-10-CM | POA: Diagnosis not present

## 2019-05-31 DIAGNOSIS — F329 Major depressive disorder, single episode, unspecified: Secondary | ICD-10-CM | POA: Diagnosis not present

## 2019-05-31 DIAGNOSIS — G4733 Obstructive sleep apnea (adult) (pediatric): Secondary | ICD-10-CM | POA: Diagnosis not present

## 2019-05-31 DIAGNOSIS — S022XXD Fracture of nasal bones, subsequent encounter for fracture with routine healing: Secondary | ICD-10-CM | POA: Diagnosis not present

## 2019-05-31 DIAGNOSIS — I4821 Permanent atrial fibrillation: Secondary | ICD-10-CM | POA: Diagnosis not present

## 2019-05-31 DIAGNOSIS — G4700 Insomnia, unspecified: Secondary | ICD-10-CM | POA: Diagnosis not present

## 2019-05-31 DIAGNOSIS — S02401D Maxillary fracture, unspecified, subsequent encounter for fracture with routine healing: Secondary | ICD-10-CM | POA: Diagnosis not present

## 2019-05-31 DIAGNOSIS — D62 Acute posthemorrhagic anemia: Secondary | ICD-10-CM | POA: Diagnosis not present

## 2019-05-31 DIAGNOSIS — G3183 Dementia with Lewy bodies: Secondary | ICD-10-CM | POA: Diagnosis not present

## 2019-05-31 DIAGNOSIS — Z8673 Personal history of transient ischemic attack (TIA), and cerebral infarction without residual deficits: Secondary | ICD-10-CM | POA: Diagnosis not present

## 2019-05-31 DIAGNOSIS — I50810 Right heart failure, unspecified: Secondary | ICD-10-CM | POA: Diagnosis not present

## 2019-05-31 DIAGNOSIS — I495 Sick sinus syndrome: Secondary | ICD-10-CM | POA: Diagnosis not present

## 2019-05-31 DIAGNOSIS — R7301 Impaired fasting glucose: Secondary | ICD-10-CM | POA: Diagnosis not present

## 2019-05-31 DIAGNOSIS — K219 Gastro-esophageal reflux disease without esophagitis: Secondary | ICD-10-CM | POA: Diagnosis not present

## 2019-05-31 DIAGNOSIS — I272 Pulmonary hypertension, unspecified: Secondary | ICD-10-CM | POA: Diagnosis not present

## 2019-05-31 DIAGNOSIS — E785 Hyperlipidemia, unspecified: Secondary | ICD-10-CM | POA: Diagnosis not present

## 2019-05-31 DIAGNOSIS — S0285XD Fracture of orbit, unspecified, subsequent encounter for fracture with routine healing: Secondary | ICD-10-CM | POA: Diagnosis not present

## 2019-05-31 DIAGNOSIS — M19042 Primary osteoarthritis, left hand: Secondary | ICD-10-CM | POA: Diagnosis not present

## 2019-05-31 DIAGNOSIS — Z7901 Long term (current) use of anticoagulants: Secondary | ICD-10-CM | POA: Diagnosis not present

## 2019-05-31 DIAGNOSIS — I130 Hypertensive heart and chronic kidney disease with heart failure and stage 1 through stage 4 chronic kidney disease, or unspecified chronic kidney disease: Secondary | ICD-10-CM | POA: Diagnosis not present

## 2019-05-31 DIAGNOSIS — M47812 Spondylosis without myelopathy or radiculopathy, cervical region: Secondary | ICD-10-CM | POA: Diagnosis not present

## 2019-06-03 DIAGNOSIS — S72002D Fracture of unspecified part of neck of left femur, subsequent encounter for closed fracture with routine healing: Secondary | ICD-10-CM | POA: Diagnosis not present

## 2019-06-03 DIAGNOSIS — G301 Alzheimer's disease with late onset: Secondary | ICD-10-CM | POA: Diagnosis not present

## 2019-06-03 DIAGNOSIS — I4891 Unspecified atrial fibrillation: Secondary | ICD-10-CM | POA: Diagnosis not present

## 2019-06-03 DIAGNOSIS — M47816 Spondylosis without myelopathy or radiculopathy, lumbar region: Secondary | ICD-10-CM | POA: Diagnosis not present

## 2019-06-03 DIAGNOSIS — E7800 Pure hypercholesterolemia, unspecified: Secondary | ICD-10-CM | POA: Diagnosis not present

## 2019-06-03 DIAGNOSIS — J40 Bronchitis, not specified as acute or chronic: Secondary | ICD-10-CM | POA: Diagnosis not present

## 2019-06-03 DIAGNOSIS — Z96642 Presence of left artificial hip joint: Secondary | ICD-10-CM | POA: Diagnosis not present

## 2019-06-03 DIAGNOSIS — I272 Pulmonary hypertension, unspecified: Secondary | ICD-10-CM | POA: Diagnosis not present

## 2019-06-03 DIAGNOSIS — G4733 Obstructive sleep apnea (adult) (pediatric): Secondary | ICD-10-CM | POA: Diagnosis not present

## 2019-06-03 DIAGNOSIS — M152 Bouchard's nodes (with arthropathy): Secondary | ICD-10-CM | POA: Diagnosis not present

## 2019-06-03 DIAGNOSIS — K219 Gastro-esophageal reflux disease without esophagitis: Secondary | ICD-10-CM | POA: Diagnosis not present

## 2019-06-03 DIAGNOSIS — N400 Enlarged prostate without lower urinary tract symptoms: Secondary | ICD-10-CM | POA: Diagnosis not present

## 2019-06-03 DIAGNOSIS — N183 Chronic kidney disease, stage 3 unspecified: Secondary | ICD-10-CM | POA: Diagnosis not present

## 2019-06-14 ENCOUNTER — Ambulatory Visit (INDEPENDENT_AMBULATORY_CARE_PROVIDER_SITE_OTHER): Payer: PPO | Admitting: *Deleted

## 2019-06-14 DIAGNOSIS — Z950 Presence of cardiac pacemaker: Secondary | ICD-10-CM

## 2019-06-15 LAB — CUP PACEART REMOTE DEVICE CHECK
Battery Impedance: 3794 Ohm
Battery Remaining Longevity: 14 mo
Battery Voltage: 2.69 V
Brady Statistic RV Percent Paced: 99 %
Date Time Interrogation Session: 20201211140644
Implantable Lead Implant Date: 20130702
Implantable Lead Location: 753860
Implantable Lead Model: 5076
Implantable Pulse Generator Implant Date: 20130702
Lead Channel Impedance Value: 0 Ohm
Lead Channel Impedance Value: 449 Ohm
Lead Channel Pacing Threshold Amplitude: 0.875 V
Lead Channel Pacing Threshold Pulse Width: 0.4 ms
Lead Channel Setting Pacing Amplitude: 2.5 V
Lead Channel Setting Pacing Pulse Width: 0.4 ms
Lead Channel Setting Sensing Sensitivity: 4 mV

## 2019-06-24 DIAGNOSIS — N400 Enlarged prostate without lower urinary tract symptoms: Secondary | ICD-10-CM | POA: Diagnosis not present

## 2019-06-24 DIAGNOSIS — I272 Pulmonary hypertension, unspecified: Secondary | ICD-10-CM | POA: Diagnosis not present

## 2019-06-24 DIAGNOSIS — M47816 Spondylosis without myelopathy or radiculopathy, lumbar region: Secondary | ICD-10-CM | POA: Diagnosis not present

## 2019-06-24 DIAGNOSIS — J40 Bronchitis, not specified as acute or chronic: Secondary | ICD-10-CM | POA: Diagnosis not present

## 2019-06-24 DIAGNOSIS — I4891 Unspecified atrial fibrillation: Secondary | ICD-10-CM | POA: Diagnosis not present

## 2019-06-24 DIAGNOSIS — G301 Alzheimer's disease with late onset: Secondary | ICD-10-CM | POA: Diagnosis not present

## 2019-06-24 DIAGNOSIS — M152 Bouchard's nodes (with arthropathy): Secondary | ICD-10-CM | POA: Diagnosis not present

## 2019-06-24 DIAGNOSIS — E7800 Pure hypercholesterolemia, unspecified: Secondary | ICD-10-CM | POA: Diagnosis not present

## 2019-06-24 DIAGNOSIS — N183 Chronic kidney disease, stage 3 unspecified: Secondary | ICD-10-CM | POA: Diagnosis not present

## 2019-07-03 DIAGNOSIS — K219 Gastro-esophageal reflux disease without esophagitis: Secondary | ICD-10-CM | POA: Diagnosis not present

## 2019-07-03 DIAGNOSIS — G4733 Obstructive sleep apnea (adult) (pediatric): Secondary | ICD-10-CM | POA: Diagnosis not present

## 2019-07-03 DIAGNOSIS — S72002D Fracture of unspecified part of neck of left femur, subsequent encounter for closed fracture with routine healing: Secondary | ICD-10-CM | POA: Diagnosis not present

## 2019-07-03 DIAGNOSIS — Z96642 Presence of left artificial hip joint: Secondary | ICD-10-CM | POA: Diagnosis not present

## 2019-08-05 DEATH — deceased

## 2020-05-04 ENCOUNTER — Ambulatory Visit: Payer: PPO | Admitting: Internal Medicine
